# Patient Record
Sex: Female | Born: 1995 | Race: Black or African American | Hispanic: No | Marital: Single | Smoking: Never smoker
Health system: Southern US, Community
[De-identification: ages and names within clinical notes are randomized; demographics above are authoritative.]

## PROBLEM LIST (undated history)

## (undated) DIAGNOSIS — E0500 Thyrotoxicosis with diffuse goiter without thyrotoxic crisis or storm: Secondary | ICD-10-CM

## (undated) DIAGNOSIS — R519 Headache, unspecified: Secondary | ICD-10-CM

## (undated) DIAGNOSIS — M1990 Unspecified osteoarthritis, unspecified site: Secondary | ICD-10-CM

## (undated) DIAGNOSIS — J189 Pneumonia, unspecified organism: Secondary | ICD-10-CM

## (undated) DIAGNOSIS — J42 Unspecified chronic bronchitis: Secondary | ICD-10-CM

## (undated) DIAGNOSIS — Z9289 Personal history of other medical treatment: Secondary | ICD-10-CM

## (undated) DIAGNOSIS — D649 Anemia, unspecified: Secondary | ICD-10-CM

## (undated) DIAGNOSIS — F419 Anxiety disorder, unspecified: Secondary | ICD-10-CM

## (undated) DIAGNOSIS — M329 Systemic lupus erythematosus, unspecified: Secondary | ICD-10-CM

## (undated) DIAGNOSIS — G319 Degenerative disease of nervous system, unspecified: Secondary | ICD-10-CM

## (undated) DIAGNOSIS — I10 Essential (primary) hypertension: Secondary | ICD-10-CM

## (undated) DIAGNOSIS — R51 Headache: Secondary | ICD-10-CM

## (undated) DIAGNOSIS — F329 Major depressive disorder, single episode, unspecified: Secondary | ICD-10-CM

## (undated) DIAGNOSIS — F32A Depression, unspecified: Secondary | ICD-10-CM

## (undated) DIAGNOSIS — IMO0002 Reserved for concepts with insufficient information to code with codable children: Secondary | ICD-10-CM

## (undated) HISTORY — PX: TONSILLECTOMY: SUR1361

---
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
## 2015-08-23 DIAGNOSIS — R7989 Other specified abnormal findings of blood chemistry: Secondary | ICD-10-CM | POA: Insufficient documentation

## 2015-10-10 DIAGNOSIS — G119 Hereditary ataxia, unspecified: Secondary | ICD-10-CM | POA: Insufficient documentation

## 2015-11-21 DIAGNOSIS — G319 Degenerative disease of nervous system, unspecified: Secondary | ICD-10-CM | POA: Diagnosis not present

## 2015-11-21 DIAGNOSIS — Z68.41 Body mass index (BMI) pediatric, greater than or equal to 95th percentile for age: Secondary | ICD-10-CM | POA: Diagnosis not present

## 2015-11-25 DIAGNOSIS — S93401A Sprain of unspecified ligament of right ankle, initial encounter: Secondary | ICD-10-CM | POA: Diagnosis not present

## 2015-11-25 DIAGNOSIS — S99911A Unspecified injury of right ankle, initial encounter: Secondary | ICD-10-CM | POA: Diagnosis not present

## 2015-11-28 DIAGNOSIS — S93401A Sprain of unspecified ligament of right ankle, initial encounter: Secondary | ICD-10-CM | POA: Diagnosis not present

## 2016-01-13 DIAGNOSIS — E0500 Thyrotoxicosis with diffuse goiter without thyrotoxic crisis or storm: Secondary | ICD-10-CM | POA: Diagnosis not present

## 2016-05-17 DIAGNOSIS — T43226A Underdosing of selective serotonin reuptake inhibitors, initial encounter: Secondary | ICD-10-CM | POA: Diagnosis not present

## 2016-05-17 DIAGNOSIS — G319 Degenerative disease of nervous system, unspecified: Secondary | ICD-10-CM | POA: Diagnosis not present

## 2016-05-17 DIAGNOSIS — R451 Restlessness and agitation: Secondary | ICD-10-CM | POA: Diagnosis not present

## 2016-05-17 DIAGNOSIS — T43626A Underdosing of amphetamines, initial encounter: Secondary | ICD-10-CM | POA: Diagnosis not present

## 2016-05-17 DIAGNOSIS — F29 Unspecified psychosis not due to a substance or known physiological condition: Secondary | ICD-10-CM | POA: Diagnosis not present

## 2016-05-17 DIAGNOSIS — R45851 Suicidal ideations: Secondary | ICD-10-CM | POA: Diagnosis not present

## 2016-05-17 DIAGNOSIS — Z91128 Patient's intentional underdosing of medication regimen for other reason: Secondary | ICD-10-CM | POA: Diagnosis not present

## 2016-05-17 DIAGNOSIS — R410 Disorientation, unspecified: Secondary | ICD-10-CM | POA: Diagnosis not present

## 2016-05-23 DIAGNOSIS — Z7251 High risk heterosexual behavior: Secondary | ICD-10-CM | POA: Diagnosis not present

## 2016-05-23 DIAGNOSIS — E0590 Thyrotoxicosis, unspecified without thyrotoxic crisis or storm: Secondary | ICD-10-CM | POA: Diagnosis not present

## 2016-06-18 DIAGNOSIS — G119 Hereditary ataxia, unspecified: Secondary | ICD-10-CM | POA: Diagnosis not present

## 2016-06-18 DIAGNOSIS — M25861 Other specified joint disorders, right knee: Secondary | ICD-10-CM | POA: Diagnosis not present

## 2016-06-18 DIAGNOSIS — R2241 Localized swelling, mass and lump, right lower limb: Secondary | ICD-10-CM | POA: Diagnosis not present

## 2016-06-18 DIAGNOSIS — M79671 Pain in right foot: Secondary | ICD-10-CM | POA: Diagnosis not present

## 2016-06-18 DIAGNOSIS — M79601 Pain in right arm: Secondary | ICD-10-CM | POA: Diagnosis not present

## 2016-06-18 DIAGNOSIS — M79631 Pain in right forearm: Secondary | ICD-10-CM | POA: Diagnosis not present

## 2016-06-18 DIAGNOSIS — M25561 Pain in right knee: Secondary | ICD-10-CM | POA: Diagnosis not present

## 2016-06-29 DIAGNOSIS — E0500 Thyrotoxicosis with diffuse goiter without thyrotoxic crisis or storm: Secondary | ICD-10-CM | POA: Diagnosis not present

## 2016-08-23 ENCOUNTER — Encounter (INDEPENDENT_AMBULATORY_CARE_PROVIDER_SITE_OTHER): Payer: Self-pay | Admitting: Physician Assistant

## 2016-08-23 ENCOUNTER — Ambulatory Visit (INDEPENDENT_AMBULATORY_CARE_PROVIDER_SITE_OTHER): Payer: Medicare Other | Admitting: Physician Assistant

## 2016-08-23 VITALS — BP 121/77 | HR 84 | Temp 99.9°F | Ht 66.14 in | Wt 261.2 lb

## 2016-08-23 DIAGNOSIS — G319 Degenerative disease of nervous system, unspecified: Secondary | ICD-10-CM | POA: Diagnosis not present

## 2016-08-23 DIAGNOSIS — M25561 Pain in right knee: Secondary | ICD-10-CM | POA: Diagnosis not present

## 2016-08-23 DIAGNOSIS — G8929 Other chronic pain: Secondary | ICD-10-CM

## 2016-08-23 DIAGNOSIS — E0500 Thyrotoxicosis with diffuse goiter without thyrotoxic crisis or storm: Secondary | ICD-10-CM | POA: Diagnosis not present

## 2016-08-23 MED ORDER — METHIMAZOLE 10 MG PO TABS: 15.0000 mg | ORAL_TABLET | Freq: Every day | ORAL | 2 refills | Status: DC

## 2016-08-23 NOTE — Patient Instructions (Signed)
Hyperthyroidism Hyperthyroidism is when the thyroid is too active (overactive). Your thyroid is a large gland that is located in your neck. The thyroid helps to control how your body uses food (metabolism). When your thyroid is overactive, it produces too much of a hormone called thyroxine. What are the causes? Causes of hyperthyroidism may include:  Graves disease. This is when your immune system attacks the thyroid gland. This is the most common cause.  Inflammation of the thyroid gland.  Tumor in the thyroid gland or somewhere else.  Excessive use of thyroid medicines, including: ? Prescription thyroid supplement. ? Herbal supplements that mimic thyroid hormones.  Solid or fluid-filled lumps within your thyroid gland (thyroid nodules).  Excessive ingestion of iodine.  What increases the risk?  Being female.  Having a family history of thyroid conditions. What are the signs or symptoms? Signs and symptoms of hyperthyroidism may include:  Nervousness.  Inability to tolerate heat.  Unexplained weight loss.  Diarrhea.  Change in the texture of hair or skin.  Heart skipping beats or making extra beats.  Rapid heart rate.  Loss of menstruation.  Shaky hands.  Fatigue.  Restlessness.  Increased appetite.  Sleep problems.  Enlarged thyroid gland or nodules.  How is this diagnosed? Diagnosis of hyperthyroidism may include:  Medical history and physical exam.  Blood tests.  Ultrasound tests.  How is this treated? Treatment may include:  Medicines to control your thyroid.  Surgery to remove your thyroid.  Radiation therapy.  Follow these instructions at home:  Take medicines only as directed by your health care provider.  Do not use any tobacco products, including cigarettes, chewing tobacco, or electronic cigarettes. If you need help quitting, ask your health care provider.  Do not exercise or do physical activity until your health care provider  approves.  Keep all follow-up appointments as directed by your health care provider. This is important. Contact a health care provider if:  Your symptoms do not get better with treatment.  You have fever.  You are taking thyroid replacement medicine and you: ? Have depression. ? Feel mentally and physically slow. ? Have weight gain. Get help right away if:  You have decreased alertness or a change in your awareness.  You have abdominal pain.  You feel dizzy.  You have a rapid heartbeat.  You have an irregular heartbeat. This information is not intended to replace advice given to you by your health care provider. Make sure you discuss any questions you have with your health care provider. Document Released: 05/07/2005 Document Revised: 10/06/2015 Document Reviewed: 09/22/2013 Elsevier Interactive Patient Education  2017 [employer] Inc[employer]  

## 2016-08-23 NOTE — Progress Notes (Signed)
Pt presents for New patient appointment  Pt states her joints hurt at times  Pt denies having pain today

## 2016-08-23 NOTE — Progress Notes (Signed)
Subjective:  Patient ID: Erika Boyer, female    DOB: [birth year]  Age: 21 y.o. MRN: [id number]  CC: establish care  HPI Erika Boyer is a 21 y.o. female with a PMH of Graves' disease presents to establish care. She is moving here with her mother from the [place], [place] area. Mother does most of the talking for patient. She was treated with methimazole 15 mg before her move here. Pt complains of feeling "all" the hyperthyroid symptoms but does not elaborate further. Patient is mostly concerned with right knee pain after a fall earlier this year. She had a XR knee AP, LAT-RT on 06/18/16 which showed moderate lateral patellar tilt. No subluxation, fracture, dislocation, or joint effusion. Has not seen an orthopedic specialist yet. Pain aggravated with walking or standing for prolonged periods. Mother also states she would like for a PCA to be done due to her daughter's cerebellar atrophy.    Review of Systems  Constitutional: Negative for chills, fever and malaise/fatigue.  Eyes: Negative for blurred vision.  Respiratory: Negative for shortness of breath.   Cardiovascular: Negative for chest pain and palpitations.  Gastrointestinal: Negative for abdominal pain and nausea.  Genitourinary: Negative for dysuria and hematuria.  Musculoskeletal: Positive for joint pain. Negative for myalgias.  Skin: Negative for rash.  Neurological: Negative for tingling and headaches.  Psychiatric/Behavioral: Negative for depression. The patient is not nervous/anxious.     Objective:  BP 121/77 (BP Location: Right Arm, Patient Position: Sitting, Cuff Size: Large)   Pulse 84   Temp 99.9 F (37.7 C) (Oral)   Ht 5' 6.14" (1.68 m)   Wt 261 lb 3.2 oz (118.5 kg)   LMP 07/24/2016 (Approximate)   SpO2 100%   BMI 41.98 kg/m   BP/Weight 08/23/2016  Systolic BP 121  Diastolic BP 77  Wt. (Lbs) 261.2  BMI 41.98      Physical Exam  Constitutional: She is oriented to person, place, and time.  obese, NAD   HENT:  Head: Normocephalic and atraumatic.  Eyes: No scleral icterus.  Neck: Normal range of motion. Neck supple. No thyromegaly present.  Cardiovascular: Normal rate, regular rhythm and normal heart sounds.   Pulmonary/Chest: Effort normal and breath sounds normal.  Abdominal: Soft. Bowel sounds are normal. There is no tenderness.  Musculoskeletal: She exhibits no edema.  Neurological: She is alert and oriented to person, place, and time.  Skin: Skin is warm and dry. No rash noted. No erythema. No pallor.  Psychiatric: She has a normal mood and affect. Her behavior is normal. Thought content normal.  Vitals reviewed.    Assessment & Plan:   1. Graves disease - Thyroid Panel With TSH - Ambulatory referral to Endocrinology. Pt to discuss thyroidectomy vs radioactive Iodine treatment. - CBC with Differential/Platelet - Comprehensive metabolic panel - methimazole (TAPAZOLE) 10 MG tablet; Take 1.5 tablets (15 mg total) by mouth daily.  Dispense: 45 tablet; Refill: 2  2. Chronic pain of right knee - Right knee with patellar tilt - AMB referral to orthopedics  3. Cerebellar atrophy - Ambulatory referral to Neurology - Mother's "PCA" request could not be justified at this point nor were any forms presented to me. Will need to have neurology consult to assess how pt's cerebellar atrophy is impacting patient's ability to take care of herself.    Meds ordered this encounter  Medications  . methimazole (TAPAZOLE) 10 MG tablet    Sig: Take 1.5 tablets (15 mg total) by mouth daily.    Dispense:  45  tablet    Refill:  2    Order Specific Question:   Supervising Provider    Answer:   Quentin Angst [[id number]]    Follow-up: Return in about 8 weeks (around 10/18/2016) for f/u thyroid.   Loletta Specter PA

## 2016-08-24 LAB — CBC WITH DIFFERENTIAL/PLATELET
BASOS ABS: 0 10*3/uL (ref 0.0–0.2)
Basos: 1 %
EOS (ABSOLUTE): 0.1 10*3/uL (ref 0.0–0.4)
Eos: 3 %
Hematocrit: 32.9 % — ABNORMAL LOW (ref 34.0–46.6)
Hemoglobin: 10.1 g/dL — ABNORMAL LOW (ref 11.1–15.9)
IMMATURE GRANS (ABS): 0 10*3/uL (ref 0.0–0.1)
Immature Granulocytes: 0 %
LYMPHS ABS: 1.1 10*3/uL (ref 0.7–3.1)
LYMPHS: 31 %
MCH: 23.8 pg — AB (ref 26.6–33.0)
MCHC: 30.7 g/dL — AB (ref 31.5–35.7)
MCV: 78 fL — AB (ref 79–97)
MONOS ABS: 0.2 10*3/uL (ref 0.1–0.9)
Monocytes: 7 %
NEUTROS ABS: 2.1 10*3/uL (ref 1.4–7.0)
Neutrophils: 58 %
PLATELETS: 293 10*3/uL (ref 150–379)
RBC: 4.24 x10E6/uL (ref 3.77–5.28)
RDW: 14.8 % (ref 12.3–15.4)
WBC: 3.6 10*3/uL (ref 3.4–10.8)

## 2016-08-24 LAB — COMPREHENSIVE METABOLIC PANEL
A/G RATIO: 0.8 — AB (ref 1.2–2.2)
ALK PHOS: 56 IU/L (ref 39–117)
ALT: 11 IU/L (ref 0–32)
AST: 14 IU/L (ref 0–40)
Albumin: 3.5 g/dL (ref 3.5–5.5)
BILIRUBIN TOTAL: 0.2 mg/dL (ref 0.0–1.2)
BUN/Creatinine Ratio: 12 (ref 9–23)
BUN: 9 mg/dL (ref 6–20)
CHLORIDE: 102 mmol/L (ref 96–106)
CO2: 27 mmol/L (ref 18–29)
Calcium: 8.7 mg/dL (ref 8.7–10.2)
Creatinine, Ser: 0.77 mg/dL (ref 0.57–1.00)
GFR calc Af Amer: 129 mL/min/{1.73_m2} (ref >59)
GFR calc non Af Amer: 112 mL/min/{1.73_m2} (ref >59)
GLUCOSE: 77 mg/dL (ref 65–99)
Globulin, Total: 4.2 g/dL (ref 1.5–4.5)
Potassium: 4.1 mmol/L (ref 3.5–5.2)
Sodium: 140 mmol/L (ref 134–144)
Total Protein: 7.7 g/dL (ref 6.0–8.5)

## 2016-08-24 LAB — THYROID PANEL WITH TSH
FREE THYROXINE INDEX: 1.9 (ref 1.2–4.9)
T3 UPTAKE RATIO: 28 % (ref 24–39)
T4 TOTAL: 6.8 ug/dL (ref 4.5–12.0)
TSH: 4.71 u[IU]/mL — ABNORMAL HIGH (ref 0.450–4.500)

## 2016-09-06 ENCOUNTER — Ambulatory Visit (INDEPENDENT_AMBULATORY_CARE_PROVIDER_SITE_OTHER): Payer: Self-pay | Admitting: Physician Assistant

## 2016-09-21 ENCOUNTER — Ambulatory Visit (INDEPENDENT_AMBULATORY_CARE_PROVIDER_SITE_OTHER): Payer: Medicare Other | Admitting: Physician Assistant

## 2016-09-21 ENCOUNTER — Ambulatory Visit (INDEPENDENT_AMBULATORY_CARE_PROVIDER_SITE_OTHER): Payer: Medicare Other

## 2016-09-21 DIAGNOSIS — S93401A Sprain of unspecified ligament of right ankle, initial encounter: Secondary | ICD-10-CM

## 2016-09-21 DIAGNOSIS — M25561 Pain in right knee: Secondary | ICD-10-CM | POA: Diagnosis not present

## 2016-09-21 DIAGNOSIS — M79671 Pain in right foot: Secondary | ICD-10-CM

## 2016-09-21 DIAGNOSIS — G8929 Other chronic pain: Secondary | ICD-10-CM | POA: Diagnosis not present

## 2016-09-21 NOTE — Progress Notes (Signed)
Office Visit Note   Patient: Erika Boyer           Date of Birth: 1995/12/05           MRN: [id number] Visit Date: 09/21/2016              Requested by: Loletta Specteroger David Gomez, PA-C [address] [place], [place] [address] PCP: Loletta Specteroger David Gomez, PA-C   Assessment & Plan: Visit Diagnoses:  1. Chronic pain of right knee   2. Sprain of right ankle, unspecified ligament, initial encounter   3. Pain in right foot     Plan: We will send her to physical therapy for quad strengthening and also for range of motion and generalized strengthening of the right ankle. Like to see her back in a month check progress lack of. She is to wear the ASO brace while ambulating she does not need to wear this to bed. Therapy is to wean her out of the ASO brace as her proprioception and strength of the right ankle improved. Follow-Up Instructions: Return in about 4 weeks (around 10/19/2016).   Orders:  Orders Placed This Encounter  Procedures  . XR Knee 1-2 Views Right  . XR Ankle Complete Right  . XR Foot Complete Right   No orders of the defined types were placed in this encounter.     Procedures: No procedures performed   Clinical Data: No additional findings.   Subjective: Right ankle and right knee pain  HPI Erika ReinOmaure is a 21 year old female I'm seeing for the first time for right knee pain is been ongoing for 7 months to a year. An acute right foot and ankle pain without known injury. She states she's been unable to bear weight on the right foot and ankle for the past 2 days. [place]. Patient with a history of Graves' disease and cerebellar atrophy. She is due to see a neurologist here in the near future. Mother states that her cerebellar atrophy started around the age of 21 after she had a virus. Patient and her mother both state that [place] was [place] in [place] and that she was told she could possibly need  reconstruction but they suggested that she try therapy first she did not go to therapy. Knee pain has been worse with ambulation. She does not describe any true mechanical symptoms of the knee. No patellar dislocation. Review of Systems No fevers chills, chest pain, shortness of breath. Otherwise please see history of present illness.  Objective: Vital Signs: Height is 5 foot 5 inches weight 250 pounds  Physical Exam  Constitutional: She is oriented to person, place, and time. She appears well-developed and well-nourished.  Cardiovascular: Intact distal pulses.   Neurological: She is alert and oriented to person, place, and time.    Ortho Exam Bilateral knee she has overall good range of motion of both knees no instability valgus varus stressing. Right knee she has tenderness over the patellar tibial tendon. She is able do a straight leg raise. There is no erythema or edema or effusion of either knee. Bilateral feet with slight swelling right greater than left. No ecchymosis appreciated. Tenderness over the anterior mid ankle to the lateral ankle no tenderness over the medial or lateral malleolus. Achilles is intact nontender. She is nontender at the medial tubercle of the calcaneus. She is reluctant to invert and evert foot secondary pain. Remainder of the foot is nontender. Specialty Comments:  No specialty  comments available.  Imaging: Xr Ankle Complete Right  Result Date: 09/21/2016 3 views right ankle shows the talus well located within the ankle mortise no diastases. No acute fractures no bony abnormalities  Xr Foot Complete Right  Result Date: 09/21/2016 3 views right foot: No acute fracture. Hallux valgus deformity. Slight Morton's type foot with the second and third metatarsals being longer first. No bony abnormalities otherwise.  Xr Knee 1-2 Views Right  Result Date: 09/21/2016 Right knee AP lateral views: No acute fracture. Medial lateral joint lines are well maintained.  Patellofemoral joint appears to be well maintained. No joint effusion.    PMFS History: Patient Active Problem List   Diagnosis Date Noted  . Graves disease 08/23/2016   No past medical history on file.  No family history on file.  No past surgical history on file. Social History   Occupational History  . Not on file.   Social History Main Topics  . Smoking status: Never Smoker  . Smokeless tobacco: Never Used  . Alcohol use Not on file  . Drug use: Unknown  . Sexual activity: Not on file

## 2016-09-24 ENCOUNTER — Other Ambulatory Visit (INDEPENDENT_AMBULATORY_CARE_PROVIDER_SITE_OTHER): Payer: Self-pay

## 2016-09-24 DIAGNOSIS — G8929 Other chronic pain: Secondary | ICD-10-CM

## 2016-09-24 DIAGNOSIS — M25561 Pain in right knee: Principal | ICD-10-CM

## 2016-09-27 ENCOUNTER — Ambulatory Visit: Payer: Medicare Other

## 2016-09-28 ENCOUNTER — Telehealth (INDEPENDENT_AMBULATORY_CARE_PROVIDER_SITE_OTHER): Payer: Self-pay | Admitting: Physician Assistant

## 2016-09-28 NOTE — Telephone Encounter (Signed)
Per patient's mother needs letter stating she can not where seat belt all the time due to her illness.  Parent left a letter, for Sindy Messingoger Gomez PA to review.  Please follow up with patient.

## 2016-10-01 NOTE — Telephone Encounter (Signed)
Left message for patient with details of why letter will not be given. Erika Boyer, CMA

## 2016-10-01 NOTE — Telephone Encounter (Signed)
Same as for the mother. I will not write a letter stating that she should not be wearing a seatbelt. She may obtain a neurological consult to have her medical condition evaluated for the exemption of wearing a seatbelt.

## 2016-10-01 NOTE — Telephone Encounter (Signed)
FWD to PCP. Verlon Pischke S Chistine Dematteo, CMA  

## 2016-10-03 ENCOUNTER — Ambulatory Visit: Payer: Medicare Other | Admitting: Physical Therapy

## 2016-10-03 ENCOUNTER — Ambulatory Visit (INDEPENDENT_AMBULATORY_CARE_PROVIDER_SITE_OTHER): Payer: Self-pay | Admitting: Orthopaedic Surgery

## 2016-10-03 ENCOUNTER — Inpatient Hospital Stay (HOSPITAL_COMMUNITY): Payer: Medicare Other

## 2016-10-03 ENCOUNTER — Inpatient Hospital Stay (HOSPITAL_COMMUNITY)
Admission: EM | Admit: 2016-10-03 | Discharge: 2016-10-12 | DRG: 871 | Disposition: A | Discharge status: Rehabilitation Facility | Payer: Medicare Other | Attending: Internal Medicine | Admitting: Internal Medicine

## 2016-10-03 ENCOUNTER — Encounter (HOSPITAL_COMMUNITY): Payer: Self-pay

## 2016-10-03 ENCOUNTER — Emergency Department (HOSPITAL_COMMUNITY): Payer: Medicare Other

## 2016-10-03 DIAGNOSIS — Z0189 Encounter for other specified special examinations: Secondary | ICD-10-CM

## 2016-10-03 DIAGNOSIS — M7989 Other specified soft tissue disorders: Secondary | ICD-10-CM | POA: Diagnosis not present

## 2016-10-03 DIAGNOSIS — A419 Sepsis, unspecified organism: Principal | ICD-10-CM | POA: Diagnosis present

## 2016-10-03 DIAGNOSIS — N289 Disorder of kidney and ureter, unspecified: Secondary | ICD-10-CM | POA: Diagnosis not present

## 2016-10-03 DIAGNOSIS — E0590 Thyrotoxicosis, unspecified without thyrotoxic crisis or storm: Secondary | ICD-10-CM | POA: Diagnosis present

## 2016-10-03 DIAGNOSIS — R0602 Shortness of breath: Secondary | ICD-10-CM

## 2016-10-03 DIAGNOSIS — Z91013 Allergy to seafood: Secondary | ICD-10-CM | POA: Diagnosis not present

## 2016-10-03 DIAGNOSIS — J90 Pleural effusion, not elsewhere classified: Secondary | ICD-10-CM | POA: Diagnosis not present

## 2016-10-03 DIAGNOSIS — J181 Lobar pneumonia, unspecified organism: Secondary | ICD-10-CM | POA: Diagnosis not present

## 2016-10-03 DIAGNOSIS — R000 Tachycardia, unspecified: Secondary | ICD-10-CM

## 2016-10-03 DIAGNOSIS — J9600 Acute respiratory failure, unspecified whether with hypoxia or hypercapnia: Secondary | ICD-10-CM | POA: Diagnosis not present

## 2016-10-03 DIAGNOSIS — M329 Systemic lupus erythematosus, unspecified: Secondary | ICD-10-CM | POA: Diagnosis present

## 2016-10-03 DIAGNOSIS — Z9889 Other specified postprocedural states: Secondary | ICD-10-CM | POA: Diagnosis not present

## 2016-10-03 DIAGNOSIS — J9690 Respiratory failure, unspecified, unspecified whether with hypoxia or hypercapnia: Secondary | ICD-10-CM | POA: Diagnosis not present

## 2016-10-03 DIAGNOSIS — N179 Acute kidney failure, unspecified: Secondary | ICD-10-CM | POA: Diagnosis present

## 2016-10-03 DIAGNOSIS — J918 Pleural effusion in other conditions classified elsewhere: Secondary | ICD-10-CM | POA: Diagnosis not present

## 2016-10-03 DIAGNOSIS — F79 Unspecified intellectual disabilities: Secondary | ICD-10-CM | POA: Diagnosis not present

## 2016-10-03 DIAGNOSIS — I309 Acute pericarditis, unspecified: Secondary | ICD-10-CM | POA: Diagnosis not present

## 2016-10-03 DIAGNOSIS — J189 Pneumonia, unspecified organism: Secondary | ICD-10-CM

## 2016-10-03 DIAGNOSIS — R0989 Other specified symptoms and signs involving the circulatory and respiratory systems: Secondary | ICD-10-CM | POA: Diagnosis not present

## 2016-10-03 DIAGNOSIS — R600 Localized edema: Secondary | ICD-10-CM | POA: Diagnosis not present

## 2016-10-03 DIAGNOSIS — I360 Nonrheumatic tricuspid (valve) stenosis: Secondary | ICD-10-CM | POA: Diagnosis not present

## 2016-10-03 DIAGNOSIS — M320 Drug-induced systemic lupus erythematosus: Secondary | ICD-10-CM | POA: Diagnosis not present

## 2016-10-03 DIAGNOSIS — Z23 Encounter for immunization: Secondary | ICD-10-CM

## 2016-10-03 DIAGNOSIS — I517 Cardiomegaly: Secondary | ICD-10-CM | POA: Diagnosis not present

## 2016-10-03 DIAGNOSIS — N19 Unspecified kidney failure: Secondary | ICD-10-CM

## 2016-10-03 DIAGNOSIS — F09 Unspecified mental disorder due to known physiological condition: Secondary | ICD-10-CM | POA: Diagnosis not present

## 2016-10-03 DIAGNOSIS — Z79899 Other long term (current) drug therapy: Secondary | ICD-10-CM | POA: Diagnosis not present

## 2016-10-03 DIAGNOSIS — R091 Pleurisy: Secondary | ICD-10-CM | POA: Diagnosis not present

## 2016-10-03 DIAGNOSIS — Z6841 Body Mass Index (BMI) 40.0 and over, adult: Secondary | ICD-10-CM

## 2016-10-03 DIAGNOSIS — D509 Iron deficiency anemia, unspecified: Secondary | ICD-10-CM | POA: Diagnosis present

## 2016-10-03 DIAGNOSIS — E876 Hypokalemia: Secondary | ICD-10-CM

## 2016-10-03 DIAGNOSIS — I10 Essential (primary) hypertension: Secondary | ICD-10-CM | POA: Diagnosis not present

## 2016-10-03 DIAGNOSIS — Z452 Encounter for adjustment and management of vascular access device: Secondary | ICD-10-CM | POA: Diagnosis not present

## 2016-10-03 DIAGNOSIS — G319 Degenerative disease of nervous system, unspecified: Secondary | ICD-10-CM | POA: Diagnosis not present

## 2016-10-03 DIAGNOSIS — D62 Acute posthemorrhagic anemia: Secondary | ICD-10-CM

## 2016-10-03 DIAGNOSIS — J9601 Acute respiratory failure with hypoxia: Secondary | ICD-10-CM | POA: Diagnosis present

## 2016-10-03 DIAGNOSIS — T50905A Adverse effect of unspecified drugs, medicaments and biological substances, initial encounter: Secondary | ICD-10-CM

## 2016-10-03 DIAGNOSIS — R069 Unspecified abnormalities of breathing: Secondary | ICD-10-CM | POA: Diagnosis not present

## 2016-10-03 DIAGNOSIS — I313 Pericardial effusion (noninflammatory): Secondary | ICD-10-CM | POA: Diagnosis not present

## 2016-10-03 DIAGNOSIS — E0500 Thyrotoxicosis with diffuse goiter without thyrotoxic crisis or storm: Secondary | ICD-10-CM | POA: Diagnosis not present

## 2016-10-03 DIAGNOSIS — Z4682 Encounter for fitting and adjustment of non-vascular catheter: Secondary | ICD-10-CM | POA: Diagnosis not present

## 2016-10-03 DIAGNOSIS — L932 Other local lupus erythematosus: Secondary | ICD-10-CM

## 2016-10-03 DIAGNOSIS — Z9289 Personal history of other medical treatment: Secondary | ICD-10-CM

## 2016-10-03 DIAGNOSIS — R5381 Other malaise: Secondary | ICD-10-CM | POA: Diagnosis not present

## 2016-10-03 HISTORY — DX: Iron deficiency anemia, unspecified: D50.9

## 2016-10-03 HISTORY — DX: Anemia, unspecified: D64.9

## 2016-10-03 HISTORY — DX: Personal history of other medical treatment: Z92.89

## 2016-10-03 HISTORY — DX: Headache: R51

## 2016-10-03 HISTORY — DX: Unspecified osteoarthritis, unspecified site: M19.90

## 2016-10-03 HISTORY — DX: Major depressive disorder, single episode, unspecified: F32.9

## 2016-10-03 HISTORY — DX: Degenerative disease of nervous system, unspecified: G31.9

## 2016-10-03 HISTORY — DX: Unspecified chronic bronchitis: J42

## 2016-10-03 HISTORY — DX: Anxiety disorder, unspecified: F41.9

## 2016-10-03 HISTORY — DX: Reserved for concepts with insufficient information to code with codable children: IMO0002

## 2016-10-03 HISTORY — DX: Pneumonia, unspecified organism: J18.9

## 2016-10-03 HISTORY — DX: Essential (primary) hypertension: I10

## 2016-10-03 HISTORY — DX: Systemic lupus erythematosus, unspecified: M32.9

## 2016-10-03 HISTORY — DX: Depression, unspecified: F32.A

## 2016-10-03 HISTORY — DX: Thyrotoxicosis, unspecified without thyrotoxic crisis or storm: E05.90

## 2016-10-03 HISTORY — DX: Headache, unspecified: R51.9

## 2016-10-03 HISTORY — DX: Thyrotoxicosis with diffuse goiter without thyrotoxic crisis or storm: E05.00

## 2016-10-03 LAB — URINALYSIS, ROUTINE W REFLEX MICROSCOPIC
BILIRUBIN URINE: NEGATIVE
Glucose, UA: NEGATIVE mg/dL
KETONES UR: 5 mg/dL — AB
Leukocytes, UA: NEGATIVE
Nitrite: NEGATIVE
PROTEIN: 100 mg/dL — AB
Specific Gravity, Urine: 1.024 (ref 1.005–1.030)
pH: 5 (ref 5.0–8.0)

## 2016-10-03 LAB — BLOOD GAS, ARTERIAL
Acid-base deficit: 5.6 mmol/L — ABNORMAL HIGH (ref 0.0–2.0)
Bicarbonate: 18.7 mmol/L — ABNORMAL LOW (ref 20.0–28.0)
DRAWN BY: 249101
FIO2: 100
MECHVT: 460 mL
O2 SAT: 99.2 %
PEEP: 5 cmH2O
PH ART: 7.368 (ref 7.350–7.450)
Patient temperature: 98.6
RATE: 16 resp/min
pCO2 arterial: 33.4 mmHg (ref 32.0–48.0)
pO2, Arterial: 181 mmHg — ABNORMAL HIGH (ref 83.0–108.0)

## 2016-10-03 LAB — COMPREHENSIVE METABOLIC PANEL
ALT: 17 U/L (ref 14–54)
AST: 34 U/L (ref 15–41)
Albumin: 2.5 g/dL — ABNORMAL LOW (ref 3.5–5.0)
Alkaline Phosphatase: 41 U/L (ref 38–126)
Anion gap: 11 (ref 5–15)
BUN: 18 mg/dL (ref 6–20)
CHLORIDE: 107 mmol/L (ref 101–111)
CO2: 19 mmol/L — AB (ref 22–32)
CREATININE: 1.46 mg/dL — AB (ref 0.44–1.00)
Calcium: 7.9 mg/dL — ABNORMAL LOW (ref 8.9–10.3)
GFR calc Af Amer: 59 mL/min — ABNORMAL LOW (ref >60)
GFR calc non Af Amer: 51 mL/min — ABNORMAL LOW (ref >60)
GLUCOSE: 118 mg/dL — AB (ref 65–99)
Potassium: 3.3 mmol/L — ABNORMAL LOW (ref 3.5–5.1)
SODIUM: 137 mmol/L (ref 135–145)
Total Bilirubin: 1.4 mg/dL — ABNORMAL HIGH (ref 0.3–1.2)
Total Protein: 7.1 g/dL (ref 6.5–8.1)

## 2016-10-03 LAB — CBC WITH DIFFERENTIAL/PLATELET
BASOS PCT: 1 %
BLASTS: 0 %
Band Neutrophils: 0 %
Basophils Absolute: 0.1 10*3/uL (ref 0.0–0.1)
EOS PCT: 1 %
Eosinophils Absolute: 0.1 10*3/uL (ref 0.0–0.7)
HEMATOCRIT: 29 % — AB (ref 36.0–46.0)
HEMOGLOBIN: 9.5 g/dL — AB (ref 12.0–15.0)
Lymphocytes Relative: 14 %
Lymphs Abs: 1.9 10*3/uL (ref 0.7–4.0)
MCH: 23 pg — AB (ref 26.0–34.0)
MCHC: 32.8 g/dL (ref 30.0–36.0)
MCV: 70.2 fL — AB (ref 78.0–100.0)
MONO ABS: 0.4 10*3/uL (ref 0.1–1.0)
MYELOCYTES: 0 %
Metamyelocytes Relative: 0 %
Monocytes Relative: 3 %
NRBC: 0 /100{WBCs}
Neutro Abs: 11 10*3/uL — ABNORMAL HIGH (ref 1.7–7.7)
Neutrophils Relative %: 81 %
Platelets: 241 10*3/uL (ref 150–400)
Promyelocytes Absolute: 0 %
RBC: 4.13 MIL/uL (ref 3.87–5.11)
RDW: 15.8 % — ABNORMAL HIGH (ref 11.5–15.5)
WBC: 13.5 10*3/uL — ABNORMAL HIGH (ref 4.0–10.5)

## 2016-10-03 LAB — I-STAT CG4 LACTIC ACID, ED
Lactic Acid, Venous: 2.62 mmol/L (ref 0.5–1.9)
Lactic Acid, Venous: 2.59 mmol/L (ref 0.5–1.9)

## 2016-10-03 LAB — TROPONIN I
TROPONIN I: 0.05 ng/mL — AB (ref <0.03)
TROPONIN I: 0.03 ng/mL — AB (ref <0.03)

## 2016-10-03 LAB — FOLATE: Folate: 21.2 ng/mL (ref >5.9)

## 2016-10-03 LAB — RETICULOCYTES
RBC.: 3.84 MIL/uL — ABNORMAL LOW (ref 3.87–5.11)
Retic Count, Absolute: 38.4 10*3/uL (ref 19.0–186.0)
Retic Ct Pct: 1 % (ref 0.4–3.1)

## 2016-10-03 LAB — PROCALCITONIN: PROCALCITONIN: 1.81 ng/mL

## 2016-10-03 LAB — IRON AND TIBC: TIBC: 190 ug/dL — AB (ref 250–450)

## 2016-10-03 LAB — I-STAT ARTERIAL BLOOD GAS, ED
ACID-BASE DEFICIT: 6 mmol/L — AB (ref 0.0–2.0)
Bicarbonate: 15.9 mmol/L — ABNORMAL LOW (ref 20.0–28.0)
O2 SAT: 85 %
PH ART: 7.444 (ref 7.350–7.450)
Patient temperature: 103.1
TCO2: 17 mmol/L (ref 0–100)
pCO2 arterial: 23.7 mmHg — ABNORMAL LOW (ref 32.0–48.0)
pO2, Arterial: 54 mmHg — ABNORMAL LOW (ref 83.0–108.0)

## 2016-10-03 LAB — T4, FREE: FREE T4: 0.91 ng/dL (ref 0.61–1.12)

## 2016-10-03 LAB — FERRITIN: FERRITIN: 298 ng/mL (ref 11–307)

## 2016-10-03 LAB — PROTIME-INR
INR: 1.68
PROTHROMBIN TIME: 20 s — AB (ref 11.4–15.2)

## 2016-10-03 LAB — TSH: TSH: 7.093 u[IU]/mL — AB (ref 0.350–4.500)

## 2016-10-03 LAB — VITAMIN B12: Vitamin B-12: 2111 pg/mL — ABNORMAL HIGH (ref 180–914)

## 2016-10-03 LAB — LACTIC ACID, PLASMA
Lactic Acid, Venous: 2.3 mmol/L (ref 0.5–1.9)
Lactic Acid, Venous: 3.2 mmol/L (ref 0.5–1.9)

## 2016-10-03 LAB — SEDIMENTATION RATE: SED RATE: 70 mm/h — AB (ref 0–22)

## 2016-10-03 LAB — BRAIN NATRIURETIC PEPTIDE: B Natriuretic Peptide: 114.1 pg/mL — ABNORMAL HIGH (ref 0.0–100.0)

## 2016-10-03 LAB — TRIGLYCERIDES: TRIGLYCERIDES: 139 mg/dL (ref <150)

## 2016-10-03 LAB — PHOSPHORUS: PHOSPHORUS: 3.5 mg/dL (ref 2.5–4.6)

## 2016-10-03 LAB — APTT: aPTT: 37 seconds — ABNORMAL HIGH (ref 24–36)

## 2016-10-03 LAB — MRSA PCR SCREENING: MRSA BY PCR: NEGATIVE

## 2016-10-03 LAB — MAGNESIUM: MAGNESIUM: 1.7 mg/dL (ref 1.7–2.4)

## 2016-10-03 MED ORDER — ROCURONIUM BROMIDE 50 MG/5ML IV SOLN
1.0000 mg/kg | Freq: Once | INTRAVENOUS | Status: AC
  Administered 2016-10-03: 50 mg via INTRAVENOUS

## 2016-10-03 MED ORDER — FENTANYL CITRATE (PF) 100 MCG/2ML IJ SOLN
100.0000 ug | INTRAMUSCULAR | Status: DC | PRN
  Administered 2016-10-03 – 2016-10-05 (×10): 100 ug via INTRAVENOUS
  Filled 2016-10-03 (×6): qty 2

## 2016-10-03 MED ORDER — FENTANYL CITRATE (PF) 100 MCG/2ML IJ SOLN
100.0000 ug | INTRAMUSCULAR | Status: DC | PRN
  Administered 2016-10-04: 100 ug via INTRAVENOUS
  Filled 2016-10-03 (×6): qty 2

## 2016-10-03 MED ORDER — DEXTROSE 5 % IV SOLN
500.0000 mg | INTRAVENOUS | Status: DC
  Administered 2016-10-04 – 2016-10-07 (×4): 500 mg via INTRAVENOUS
  Filled 2016-10-03 (×4): qty 500

## 2016-10-03 MED ORDER — KETAMINE HCL 10 MG/ML IJ SOLN: 2.0000 mg/kg | Freq: Once | INTRAMUSCULAR | Status: DC

## 2016-10-03 MED ORDER — SODIUM CHLORIDE 0.9 % IV BOLUS (SEPSIS)
1000.0000 mL | Freq: Once | INTRAVENOUS | Status: AC
  Administered 2016-10-03: 1000 mL via INTRAVENOUS

## 2016-10-03 MED ORDER — HEPARIN SODIUM (PORCINE) 5000 UNIT/ML IJ SOLN: 5000.0000 [IU] | Freq: Three times a day (TID) | INTRAMUSCULAR | Status: DC

## 2016-10-03 MED ORDER — POTASSIUM CHLORIDE 10 MEQ/50ML IV SOLN: 10.0000 meq | INTRAVENOUS | Status: DC

## 2016-10-03 MED ORDER — ACETAMINOPHEN 325 MG PO TABS
650.0000 mg | ORAL_TABLET | Freq: Once | ORAL | Status: AC
  Administered 2016-10-03: 650 mg via ORAL
  Filled 2016-10-03: qty 2

## 2016-10-03 MED ORDER — DEXTROSE 5 % IV SOLN
1.0000 g | INTRAVENOUS | Status: AC
  Administered 2016-10-04 – 2016-10-09 (×6): 1 g via INTRAVENOUS
  Filled 2016-10-03 (×6): qty 10

## 2016-10-03 MED ORDER — DOCUSATE SODIUM 50 MG/5ML PO LIQD: 100.0000 mg | Freq: Two times a day (BID) | ORAL | Status: DC | PRN

## 2016-10-03 MED ORDER — SODIUM CHLORIDE 0.9 % IV SOLN
INTRAVENOUS | Status: DC
  Administered 2016-10-03 – 2016-10-06 (×9): via INTRAVENOUS

## 2016-10-03 MED ORDER — ASPIRIN 81 MG PO CHEW
81.0000 mg | CHEWABLE_TABLET | Freq: Every day | ORAL | Status: DC
  Administered 2016-10-03 – 2016-10-05 (×3): 81 mg
  Filled 2016-10-03 (×3): qty 1

## 2016-10-03 MED ORDER — ETOMIDATE 2 MG/ML IV SOLN
0.3000 mg/kg | Freq: Once | INTRAVENOUS | Status: AC
  Administered 2016-10-03: 20 mg via INTRAVENOUS

## 2016-10-03 MED ORDER — METHYLPREDNISOLONE SODIUM SUCC 40 MG IJ SOLR
40.0000 mg | Freq: Two times a day (BID) | INTRAMUSCULAR | Status: DC
  Administered 2016-10-03 – 2016-10-06 (×5): 40 mg via INTRAVENOUS
  Filled 2016-10-03 (×5): qty 1

## 2016-10-03 MED ORDER — DEXTROSE 5 % IV SOLN
500.0000 mg | Freq: Once | INTRAVENOUS | Status: AC
  Administered 2016-10-03: 500 mg via INTRAVENOUS
  Filled 2016-10-03: qty 500

## 2016-10-03 MED ORDER — MIDAZOLAM HCL 2 MG/2ML IJ SOLN
4.0000 mg | Freq: Once | INTRAMUSCULAR | Status: AC
  Administered 2016-10-03: 4 mg via INTRAVENOUS
  Filled 2016-10-03 (×2): qty 4

## 2016-10-03 MED ORDER — PROPOFOL 1000 MG/100ML IV EMUL
0.0000 ug/kg/min | INTRAVENOUS | Status: DC
  Administered 2016-10-03: 10 ug/kg/min via INTRAVENOUS
  Administered 2016-10-03: 40 ug/kg/min via INTRAVENOUS
  Administered 2016-10-04 (×2): 30 ug/kg/min via INTRAVENOUS
  Administered 2016-10-04: 40 ug/kg/min via INTRAVENOUS
  Administered 2016-10-04: 50 ug/kg/min via INTRAVENOUS
  Administered 2016-10-04: 35 ug/kg/min via INTRAVENOUS
  Administered 2016-10-04: 40 ug/kg/min via INTRAVENOUS
  Administered 2016-10-05: 20 ug/kg/min via INTRAVENOUS
  Administered 2016-10-05 (×2): 40 ug/kg/min via INTRAVENOUS
  Filled 2016-10-03 (×5): qty 100
  Filled 2016-10-03 (×2): qty 200
  Filled 2016-10-03 (×4): qty 100

## 2016-10-03 MED ORDER — MIDAZOLAM HCL 2 MG/2ML IJ SOLN: 4.0000 mg | Freq: Once | INTRAMUSCULAR | Status: DC

## 2016-10-03 MED ORDER — PANTOPRAZOLE SODIUM 40 MG IV SOLR
40.0000 mg | Freq: Every day | INTRAVENOUS | Status: DC
  Administered 2016-10-03 – 2016-10-04 (×2): 40 mg via INTRAVENOUS
  Filled 2016-10-03 (×2): qty 40

## 2016-10-03 MED ORDER — ONDANSETRON HCL 4 MG/2ML IJ SOLN
4.0000 mg | Freq: Four times a day (QID) | INTRAMUSCULAR | Status: DC | PRN
  Administered 2016-10-06 – 2016-10-09 (×3): 4 mg via INTRAVENOUS
  Filled 2016-10-03 (×3): qty 2

## 2016-10-03 MED ORDER — ROCURONIUM BROMIDE 50 MG/5ML IV SOLN: 1.0000 mg/kg | Freq: Once | INTRAVENOUS | Status: DC

## 2016-10-03 MED ORDER — FENTANYL CITRATE (PF) 100 MCG/2ML IJ SOLN
50.0000 ug | Freq: Once | INTRAMUSCULAR | Status: DC
  Filled 2016-10-03: qty 2

## 2016-10-03 MED ORDER — SODIUM CHLORIDE 0.9 % IV SOLN: 250.0000 mL | INTRAVENOUS | Status: DC | PRN

## 2016-10-03 MED ORDER — ENOXAPARIN SODIUM 40 MG/0.4ML ~~LOC~~ SOLN
40.0000 mg | SUBCUTANEOUS | Status: DC
  Administered 2016-10-04 – 2016-10-12 (×9): 40 mg via SUBCUTANEOUS
  Filled 2016-10-03 (×9): qty 0.4

## 2016-10-03 MED ORDER — ORAL CARE MOUTH RINSE
15.0000 mL | Freq: Four times a day (QID) | OROMUCOSAL | Status: DC
  Administered 2016-10-04 (×2): 15 mL via OROMUCOSAL

## 2016-10-03 MED ORDER — DEXTROSE 5 % IV SOLN
1.0000 g | Freq: Once | INTRAVENOUS | Status: AC
  Administered 2016-10-03: 1 g via INTRAVENOUS
  Filled 2016-10-03: qty 10

## 2016-10-03 MED ORDER — CHLORHEXIDINE GLUCONATE 0.12% ORAL RINSE (MEDLINE KIT)
15.0000 mL | Freq: Two times a day (BID) | OROMUCOSAL | Status: DC
  Administered 2016-10-03: 15 mL via OROMUCOSAL

## 2016-10-03 NOTE — ED Notes (Signed)
ED Provider at bedside. 

## 2016-10-03 NOTE — Progress Notes (Signed)
[name] paged regarding CT Chest results with ETT lying directly above the carina with recommendation to pull ETT back by 2cm. [name] in agreement. Respiratory called and notified of orders. Will continue to monitor.   Erika PorchBradley Maurice Ramseur

## 2016-10-03 NOTE — ED Notes (Signed)
RN called patient's mother, who states she is disabled herself and needs to rest. She stated that she's been helping take care of her daughter for weeks now. If any questions, she may be reached at [phone]. She states she will be here tomorrow and patient's boyfriend will be here tonight.

## 2016-10-03 NOTE — Progress Notes (Signed)
eLink Physician-Brief Progress Note Patient Name: Erika Boyer DOB: 10-17-1995 MRN: [id number]   Date of Service  10/03/2016  HPI/Events of Note  ETT at the level of the carina on Chest CT Scan.   eICU Interventions  Will pull ETT back by 2 cm and re-secure.      Intervention Category Major Interventions: Other:  Lenell AntuSommer,Steven Eugene 10/03/2016, 10:50 PM

## 2016-10-03 NOTE — Progress Notes (Signed)
eLink Physician-Brief Progress Note Patient Name: Erika Boyer DOB: [birth year] MRN: [id number]   Date of Service  10/03/2016  HPI/Events of Note  Multiple issues: 1. Lactic Acid = 2.62 >> 2.59 >> 2.3 and Troponin = 0.05.  eICU Interventions  Will order: 1. ASA 81 mg per tube now and Q day. 2. Continue to trend Troponin. 3. Continue to trend Lactic Acid.     Intervention Category Major Interventions: Acid-Base disturbance - evaluation and management Intermediate Interventions: Diagnostic test evaluation  Sommer,Steven Eugene 10/03/2016, 9:27 PM

## 2016-10-03 NOTE — H&P (Signed)
History and Physical    Erika Boyer SEL:[id number] DOB: [birth year] DOA: 10/03/2016   PCP: [name], PA-C   Patient coming from/Resides with: Private residence/mother  Admission status: Inpatient/SDU-medically necessary to stay a minimum 2 midnights to rule out impending and/or unexpected changes in physiologic status that may differ from initial evaluation performed in the ER and/or at time of admission. Presents critically ill with acute hypoxemic respiratory failure and near white out of left lung field on chest x-ray with an unexplained cardiomegaly. History limited by patient's ability to participate and the patient's mother has left the hospital. Patient in acute respiratory distress with increased work of breathing and tachycardia prompting immediate placement of BiPAP, she will likely transition to a higher level of care in the ICU and potentially could require intubation. She will require (at a minimum) a CT of the chest to better clarify the abnormalities on chest x-ray(effusion). Given her enlarged heart and fever with the suspected source considered to be pneumonia. Unfortunately we cannot exclude underlying heart failure/possible viral cardiomyopathy at time of admission. She will require close monitoring of hemodynamic status, telemetry, strict I/O, IV antibiotics, inpatient echocardiogram and assistance with ADLs.  Chief Complaint: Shortness of breath  HPI: Erika Boyer is a 21 y.o. female with medical history significant for Graves' disease on Tapazole, obesity, and cerebral atrophy. Patient recently moved to the [place], [place] area from [facility] and at this time no medical records are available. The patient is accompanied by her mother and both the patient and the mother are poor historians. Apparently this patient had been having some sort of infectious process including the lungs for "some time now" this prompted the mother to give the patient  left over antibiotics. Apparently the symptoms have been worsening over the past 3 days. Due to some confusion when the initial call to EMS was made (sounds like a language barrier issue on the mother's behalf) it was initially reported that the patient had an allergic reaction and she was given epinephrine. She was also given Solu-Medrol, albuterol and Atrovent. Upon arrival to the ER the patient denied fevers, abdominal pain, diarrhea or urinary symptoms, neck pain or headache. When I spoke with the patient she thinks that she began getting sick last week and has had some emesis. She is unsure if she's been having fevers. Upon my evaluation of the patient she had increased work of breathing with respiratory rates ranging between 35 and 45 breast per minute, resting heart rate 136 despite having received volume resuscitation and antibiotics in the ER. She had a MAXIMUM TEMPERATURE of 103.1 in the ER. A urine sample has not yet been obtained. Chest x-ray revealed near white out of the left lung field with a small effusion on the right base as well. On exam, the patient was not moving air on the left side and had faint expiratory crackles with poor air movement on the right. She was on 100% nonrebreather upon my assessment and I immediately called for her RN and asked her to notify respiratory therapy that BiPAP was required for this patient stat. Her mother has left the hospital according to the patient's nurse. The nurse verbalize concerns over the patient's ability to care for herself independently despite the mothers ascertation she is that "she is 49 years old and should be moving out and living on her own by now". (RN reported this to me)  ED Course:  Vital Signs: BP 129/75   Pulse (!) 125  Temp 98.4 F (36.9 C) (Axillary)   Resp (!) 25   Ht 5' 5"  (1.651 m)   Wt 117.9 kg (260 lb)   SpO2 99%   BMI 43.27 kg/m  PCXR: Large left-sided pleural effusion with likely underlying infiltrate; I also  appreciate what appears to be a small right pleural effusion swell significant cardiomegaly Lab data: Sodium 137, potassium 3.3, chloride 107, CO2 19, glucose 118, BUN 18, creatinine 1.46, calcium 7.9, albumin 2.5, total bilirubin 1.4, lactic acid 2.6 to, white count 13,500 with neutrophils 81% and absolute neutrophils 11%, hemoglobin 9.5, MCV 70.2, platelets 241,000, TSH 7.093 with free T4 0.9, blood cultures obtained in the ER, urinalysis and culture ordered but pending collection ABG: PH 7.44 PCO2 23.7, PO2 54, TC 217, a be 6, bicarbonate 15.9, O2 saturation 95% Medications and treatments: Normal saline bolus 1 L, Tylenol 650 mg 1, Rocephin 1 g IV 1, Zithromax 5 mg IV 1  Review of Systems:  **Able to obtain accurate/adequate review of systems given patient's inability to participate and mother no longer at bedside Majority of review of systems obtained from the medical record and from the nurse at the bedside   Past Medical History:  Diagnosis Date  . Cerebellar degeneration     Past Surgical History:  Procedure Laterality Date  . BREAST REDUCTION SURGERY    . TONSILLECTOMY      Social History   Social History  . Marital status: Single    Spouse name: N/A  . Number of children: N/A  . Years of education: N/A   Occupational History  . Not on file.   Social History Main Topics  . Smoking status: Never Smoker  . Smokeless tobacco: Never Used  . Alcohol use Not on file  . Drug use: Unknown  . Sexual activity: Not on file   Other Topics Concern  . Not on file   Social History Narrative  . No narrative on file    Mobility: Unknown-patient reports fully ambulatory prior to admission Work history: Disabled   Allergies  Allergen Reactions  . Morphine And Related Itching     Family historyUnable to be review secondary to patient's inability to participate accurately due to underlying chronic neurological condition as well as acute altered mentation from  sepsis  Prior to Admission medications   Medication Sig Start Date End Date Taking? Authorizing Provider  methimazole (TAPAZOLE) 10 MG tablet Take 1.5 tablets (15 mg total) by mouth daily. 08/23/16   [name], PA-C    Physical Exam: Vitals:   10/03/16 1600 10/03/16 1610 10/03/16 1635 10/03/16 1645  BP: 137/86  118/79 129/75  Pulse: (!) 135  (!) 128 (!) 125  Resp: (!) 25  (!) 25   Temp:   98.4 F (36.9 C)   TempSrc:   Axillary   SpO2: 100% 100% 100% 99%  Weight:      Height:          Constitutional: Quite toxic appearing with increased work of breathing and respiratory distress, diaphoretic Eyes: PERRL, lids and conjunctivae normal ENMT: Mucous membranes are moist. Posterior pharynx clear of any exudate or lesions.  Neck: normal, supple, no masses, no thyromegaly Respiratory: Marked diminished breath sounds throughout worse on the left with fine expiratory crackles mid fields down on right, respiratory rate between 35 and 45 breaths per minute, increased work of breathing with use of accessory muscles to breathe, 100% NRB mask-subsequently after my arrival has been changed to BiPAP per RT Cardiovascular:  Regular tachycardic rate in the 130s at rest, no murmurs / rubs / gallops. No extremity edema but somewhat difficult to ascertain given underlying obesity. 2+ pedal pulses. No carotid bruits.  Abdomen: no tenderness, no masses palpated. No hepatosplenomegaly. Bowel sounds positive.  Musculoskeletal: no clubbing / cyanosis. No joint deformity upper and lower extremities. Good ROM, no contractures. Normal muscle tone.  Skin: no rashes, lesions, ulcers. No induration-she does have irregular shaped linear areas on the lower extremities (I merely the tibial region) that are reddened but not raised and appears somewhat bruise-like and appear consistent with trauma from scratching and extremities Neurologic: CN 2-12 grossly intact based on limited exam. Sensation intact, DTR normal.  Strength 4/5 x all 4 extremities. Able to turn self onto left side independently  Psychiatric: Lethargic but able to participate somewhat in history, appears to be oriented to at least name and place   Labs on Admission: I have personally reviewed following labs and imaging studies  CBC:  Recent Labs Lab 10/03/16 1408  WBC 13.5*  NEUTROABS 11.0*  HGB 9.5*  HCT 29.0*  MCV 70.2*  PLT 916   Basic Metabolic Panel:  Recent Labs Lab 10/03/16 1408  NA 137  K 3.3*  CL 107  CO2 19*  GLUCOSE 118*  BUN 18  CREATININE 1.46*  CALCIUM 7.9*   GFR: Estimated Creatinine Clearance: 79 mL/min (A) (by C-G formula based on SCr of 1.46 mg/dL (H)). Liver Function Tests:  Recent Labs Lab 10/03/16 1408  AST 34  ALT 17  ALKPHOS 41  BILITOT 1.4*  PROT 7.1  ALBUMIN 2.5*   No results for input(s): LIPASE, AMYLASE in the last 168 hours. No results for input(s): AMMONIA in the last 168 hours. Coagulation Profile: No results for input(s): INR, PROTIME in the last 168 hours. Cardiac Enzymes: No results for input(s): CKTOTAL, CKMB, CKMBINDEX, TROPONINI in the last 168 hours. BNP (last 3 results) No results for input(s): PROBNP in the last 8760 hours. HbA1C: No results for input(s): HGBA1C in the last 72 hours. CBG: No results for input(s): GLUCAP in the last 168 hours. Lipid Profile: No results for input(s): CHOL, HDL, LDLCALC, TRIG, CHOLHDL, LDLDIRECT in the last 72 hours. Thyroid Function Tests:  Recent Labs  10/03/16 1408  TSH 7.093*  FREET4 0.91   Anemia Panel: No results for input(s): VITAMINB12, FOLATE, FERRITIN, TIBC, IRON, RETICCTPCT in the last 72 hours. Urine analysis: No results found for: COLORURINE, APPEARANCEUR, LABSPEC, PHURINE, GLUCOSEU, HGBUR, BILIRUBINUR, KETONESUR, PROTEINUR, UROBILINOGEN, NITRITE, LEUKOCYTESUR Sepsis Labs: @LABRCNTIP (procalcitonin:4,lacticidven:4) )No results found for this or any previous visit (from the past 240 hour(s)).   Radiological  Exams on Admission: Dg Chest Port 1 View  Result Date: 10/03/2016 CLINICAL DATA:  Shortness of breath and fevers EXAM: PORTABLE CHEST 1 VIEW COMPARISON:  None. FINDINGS: Cardiac shadow is prominent. Large left-sided pleural effusion is noted. There is likely underlying infiltrate present. No bony abnormality is noted. The right lung is well aerated without focal infiltrate. IMPRESSION: Large left-sided pleural effusion with likely underlying infiltrate. Electronically Signed   By: Inez Catalina M.D.   On: 10/03/2016 14:04    EKG: (Independently reviewed) ordered but does not appear to have been completed  Assessment/Plan Principal Problem:   Sepsis  -Presents critically ill with shortness of breath and abnormal chest x-ray with large left pleural effusion, fever 103 with leukocytosis and presumed pneumonia etiology -Urinalysis/cx pending -Continue empiric Rocephin/Zithromax -Follow up on blood cultures -Tachycardic but maintaining systolic blood pressure greater than 90 and  MAP greater than 65 so no indication for 30 mL/KG volume resuscitation at this juncture -Cycle lactic acid -Anticipate will transition to ICU level of care later this evening -Procalcitonin  Active Problems:   Acute respiratory failure with hypoxia  -ABG with PO70 2 in a 21 year old patient with no underlying lung disease -Continue supportive care with BiPAP -Low threshold that patient will require intubation given increased work of breathing and persistent tachypnea and tachycardia -PCCM consulted and will see the patient ASAP-RN instructed that patient is to not leave the ER until evaluated by the critical care team ** 1730: PCCM to move pt to trauma/ICU room to intubate and perform thoracentesis    CAP (community acquired pneumonia) -Appears to have parapneumonic effusion in the setting of fever, although unclear if fever actually coming from pulmonary source vs urinary tract -Urinary Legionella -Influenza  PCR -HIV -ESR -Sputum culture if can obtain -Follow up on blood cultures -Continue empiric antibiotics as above -Supportive care as above -May require thoracentesis; if can tolerate obtain CT of the chest to better clarify effusion-if parapneumonic may require CVTS evaluation for possible VATS procedure    Cardiomegaly -Single view chest x-ray with marked cardiomegaly -Significant portions of past medical history unknown and mother no longer at bedside -Uncertain if patient has long-standing cardiomegaly and therefore may have underlying heart failure related to her hyperthyroidism/tachycardia -Obtain BNP -We'll continue to give IV fluid hydration/resuscitation and setting of sepsis but monitor for progressive worsening of respiratory status -Echocardiogram -Possible this is viral cardiomyopathy    Hyperthyroidism -On Tapazole at home -TSH here just above 7 with normal free T4 -Tapazole on hold given severity of respiratory status and need for NPO status -Current tachycardia could just be appropriate physiologic response to resp distress and sepsis -If tachycardia persists consider beta blocker IV -Does not appear to be in thyroid storm given above TSH    Acute kidney injury  -Baseline renal function in April: 9 and 0.77 -Current renal function: 18 and 1.46 -Hydrate and follow labs -Avoid nephrotoxic medications    Morbid obesity with BMI of 40.0-44.9, adult  -Once medically stable will need nutrition consultation for weight reduction strategies    Cerebellar degeneration -Once patient has recovered we'll need to determine baseline mentation and ability to manage herself i.e. does she have capacity to make her own decisions and manage independent of her mother -May need PT/OT evaluation regarding any mobility issues -May need psychiatric evaluation to determine capacity    Microcytic anemia -Hemoglobin in April was 10.1 with current hemoglobin 9.5 with microcytosis -Check  anemia panel      DVT prophylaxis: Lovenox  Code Status: Full Family Communication: No family at bedside; 1730: Updated boyfriend at bedside and advised him to notify pt mom of need to intubate and move to ICU and need for her to speak w/ PCCM doctors Disposition Plan: To be determined Consults called: PCCM/Summer ([place])    Kailey Esquilin L. ANP-BC [facility] Pager [phone]   If 7PM-7AM, please contact night-coverage [email or web address] Password [email or web address]  10/03/2016, 4:53 PM

## 2016-10-03 NOTE — Procedures (Signed)
Intubation Procedure Note Shivani Barrantes [id number] Oct 14, 1995  Procedure: Intubation Indications: Airway protection and maintenance  Procedure Details Consent: Risks of procedure as well as the alternatives and risks of each were explained to the (patient/caregiver).  Consent for procedure obtained. Time Out: Verified patient identification, verified procedure, site/side was marked, verified correct patient position, special equipment/implants available, medications/allergies/relevent history reviewed, required imaging and test results available.  Performed  Premedicated with fentanyl, versed, etomidate, and rocuronium.    Maximum sterile technique was used including gloves, gown, hand hygiene and mask.  MAC and 3 glideoscope used.  7.5 ETT placed at 22 cm at the lip with positive ETCO2 color change, equal breath sounds and absent epigastric sounds.    Evaluation Hemodynamic Status: BP stable throughout; O2 sats: transiently fell during during procedure, improved now. Patient's Current Condition: stable Complications: No apparent complications Patient did tolerate procedure well. Chest X-ray ordered to verify placement.  CXR: pending.  Procedure performed under direct supervision of Dr. Meliton Rattan, AGACNP-BC [place] Pgr: [phone] or if no answer [phone] 10/03/2016, 7:16 PM

## 2016-10-03 NOTE — ED Provider Notes (Signed)
Medical screening examination/treatment/procedure(s) were conducted as a shared visit with non-physician practitioner(s) and myself.  I personally evaluated the patient during the encounter.   EKG Interpretation None      Results for orders placed or performed during the hospital encounter of 10/03/16  Comprehensive metabolic panel  Result Value Ref Range   Sodium 137 135 - 145 mmol/L   Potassium 3.3 (L) 3.5 - 5.1 mmol/L   Chloride 107 101 - 111 mmol/L   CO2 19 (L) 22 - 32 mmol/L   Glucose, Bld 118 (H) 65 - 99 mg/dL   BUN 18 6 - 20 mg/dL   Creatinine, Ser 1.61 (H) 0.44 - 1.00 mg/dL   Calcium 7.9 (L) 8.9 - 10.3 mg/dL   Total Protein 7.1 6.5 - 8.1 g/dL   Albumin 2.5 (L) 3.5 - 5.0 g/dL   AST 34 15 - 41 U/L   ALT 17 14 - 54 U/L   Alkaline Phosphatase 41 38 - 126 U/L   Total Bilirubin 1.4 (H) 0.3 - 1.2 mg/dL   GFR calc non Af Amer 51 (L) >60 mL/min   GFR calc Af Amer 59 (L) >60 mL/min   Anion gap 11 5 - 15  CBC WITH DIFFERENTIAL  Result Value Ref Range   WBC 13.5 (H) 4.0 - 10.5 K/uL   RBC 4.13 3.87 - 5.11 MIL/uL   Hemoglobin 9.5 (L) 12.0 - 15.0 g/dL   HCT 09.6 (L) 04.5 - 40.9 %   MCV 70.2 (L) 78.0 - 100.0 fL   MCH 23.0 (L) 26.0 - 34.0 pg   MCHC 32.8 30.0 - 36.0 g/dL   RDW 81.1 (H) 91.4 - 78.2 %   Platelets 241 150 - 400 K/uL   Neutrophils Relative % PENDING %   Neutro Abs PENDING 1.7 - 7.7 K/uL   Band Neutrophils PENDING %   Lymphocytes Relative PENDING %   Lymphs Abs PENDING 0.7 - 4.0 K/uL   Monocytes Relative PENDING %   Monocytes Absolute PENDING 0.1 - 1.0 K/uL   Eosinophils Relative PENDING %   Eosinophils Absolute PENDING 0.0 - 0.7 K/uL   Basophils Relative PENDING %   Basophils Absolute PENDING 0.0 - 0.1 K/uL   WBC Morphology PENDING    RBC Morphology PENDING    Smear Review PENDING    nRBC PENDING 0 /100 WBC   Metamyelocytes Relative PENDING %   Myelocytes PENDING %   Promyelocytes Absolute PENDING %   Blasts PENDING %  TSH  Result Value Ref Range   TSH  7.093 (H) 0.350 - 4.500 uIU/mL  T4, free  Result Value Ref Range   Free T4 0.91 0.61 - 1.12 ng/dL  I-Stat CG4 Lactic Acid, ED  (not at  [facility])  Result Value Ref Range   Lactic Acid, Venous 2.62 (HH) 0.5 - 1.9 mmol/L   Comment NOTIFIED PHYSICIAN   I-Stat arterial blood gas, ED  Result Value Ref Range   pH, Arterial 7.444 7.350 - 7.450   pCO2 arterial 23.7 (L) 32.0 - 48.0 mmHg   pO2, Arterial 54.0 (L) 83.0 - 108.0 mmHg   Bicarbonate 15.9 (L) 20.0 - 28.0 mmol/L   TCO2 17 0 - 100 mmol/L   O2 Saturation 85.0 %   Acid-base deficit 6.0 (H) 0.0 - 2.0 mmol/L   Patient temperature 103.1 F    Collection site RADIAL, ALLEN'S TEST ACCEPTABLE    Drawn by Operator    Sample type ARTERIAL    Dg Chest Port 1 View  Result Date:  10/03/2016 CLINICAL DATA:  Shortness of breath and fevers EXAM: PORTABLE CHEST 1 VIEW COMPARISON:  None. FINDINGS: Cardiac shadow is prominent. Large left-sided pleural effusion is noted. There is likely underlying infiltrate present. No bony abnormality is noted. The right lung is well aerated without focal infiltrate. IMPRESSION: Large left-sided pleural effusion with likely underlying infiltrate. Electronically Signed   By: Alcide CleverMark  Lukens M.D.   On: 10/03/2016 14:04   Xr Ankle Complete Right  Result Date: 09/21/2016 3 views right ankle shows the talus well located within the ankle mortise no diastases. No acute fractures no bony abnormalities  Xr Foot Complete Right  Result Date: 09/21/2016 3 views right foot: No acute fracture. Hallux valgus deformity. Slight Morton's type foot with the second and third metatarsals being longer first. No bony abnormalities otherwise.  Xr Knee 1-2 Views Right  Result Date: 09/21/2016 Right knee AP lateral views: No acute fracture. Medial lateral joint lines are well maintained. Patellofemoral joint appears to be well maintained. No joint effusion.    Patient brought in by EMS for supposedly allergic reaction. Patient was given epinephrine.  Arrived here with significant sinus tachycardia 05/21/1948. Probably secondary to that. X-ray shows pneumonia pleural effusion on the left side. Patient's other present in vital signs consistent with sepsis plus had a market fever. Patient started on sepsis protocol. Given antibiotics for pneumonia. Patient also started on BiPAP. Patient states that she has been having shortness of breath for 3 days working hard to breathe for 3 days. Patient's respiratory rate was up here. Patient tolerating the BiPAP well. Patient will require admission to the medicine service stepdown unit.  Patient alert. Lactic acid elevated. Blood gas shows no evidence of acidosis or CO2 retention.  CRITICAL CARE Performed by: Vanetta MuldersZACKOWSKI,Mamie Hundertmark Total critical care time: 30  minutes Critical care time was exclusive of separately billable procedures and treating other patients. Critical care was necessary to treat or prevent imminent or life-threatening deterioration. Critical care was time spent personally by me on the following activities: development of treatment plan with patient and/or surrogate as well as nursing, discussions with consultants, evaluation of patient's response to treatment, examination of patient, obtaining history from patient or surrogate, ordering and performing treatments and interventions, ordering and review of laboratory studies, ordering and review of radiographic studies, pulse oximetry and re-evaluation of patient's condition.   Vanetta MuldersZackowski, Chace Bisch, MD 10/03/16 [phone]

## 2016-10-03 NOTE — Progress Notes (Signed)
CRITICAL VALUE ALERT  Critical value received:  Lactic Acid 2.3/Troponin 0.05  Date of notification: 10/03/16   Time of notification: 2100  Critical value read back: Yes  Nurse who received alert:  Feliberto GottronBrad Karole Oo RN  MD notified (1st page):  Arsenio LoaderSommer MD  Time of first page:  2130  MD notified (2nd page):  Time of second page:  Responding MD:  Arsenio LoaderSommer MD  Time MD responded:  2130

## 2016-10-03 NOTE — ED Provider Notes (Signed)
[facility]-EMERGENCY DEPT Provider Note   CSN: [id number] Arrival date & time: 10/03/16  1325     History   Chief Complaint Chief Complaint  Patient presents with  . Allergic Reaction  . Shortness of Breath    HPI Erika Boyer is a 21 y.o. female.  Erika Boyer is a 21 y.o. Female with a history of Graves' disease and cerebral atrophy presents to the emergency department complaining of coughing and shortness of breath for the past 3 days. She was not aware she had a fever until arrival to the emergency department. She is a poor historian. She reports some increased leg swelling for the past two weeks. She reports redness to her bilateral legs and that this sometimes is from an allergic reaction. She reports one episode of vomiting today. She was given 0.3 mg epinephrine, solumedrol, albuterol and atrovent by EMS prior to arrival. She denies fevers, abdominal pain, diarrhea, urinary symptoms, neck pain, syncope, or headache.    The history is provided by the patient and medical records. No language interpreter was used.  Allergic Reaction  Presenting symptoms: rash and wheezing   Shortness of Breath  Associated symptoms include a fever, sore throat, cough, wheezing, vomiting, rash and leg swelling. Pertinent negatives include no headaches, no neck pain, no chest pain and no abdominal pain.    Past Medical History:  Diagnosis Date  . Cerebellar degeneration     Patient Active Problem List   Diagnosis Date Noted  . Graves disease 08/23/2016    Past Surgical History:  Procedure Laterality Date  . BREAST REDUCTION SURGERY    . TONSILLECTOMY      OB History    No data available       Home Medications    Prior to Admission medications   Medication Sig Start Date End Date Taking? Authorizing Provider  methimazole (TAPAZOLE) 10 MG tablet Take 1.5 tablets (15 mg total) by mouth daily. 08/23/16   Loletta Specter, PA-C    Family History No family history on  file.  Social History Social History  Substance Use Topics  . Smoking status: Never Smoker  . Smokeless tobacco: Never Used  . Alcohol use Not on file     Allergies   Morphine and related   Review of Systems Review of Systems  Constitutional: Positive for fatigue and fever.  HENT: Positive for sore throat. Negative for congestion.   Eyes: Negative for visual disturbance.  Respiratory: Positive for cough, shortness of breath and wheezing.   Cardiovascular: Positive for leg swelling. Negative for chest pain and palpitations.  Gastrointestinal: Positive for vomiting. Negative for abdominal pain, diarrhea and nausea.  Genitourinary: Negative for dysuria, flank pain and frequency.  Musculoskeletal: Negative for back pain and neck pain.  Skin: Positive for rash. Negative for wound.  Neurological: Negative for syncope, light-headedness and headaches.     Physical Exam Updated Vital Signs BP 131/86   Pulse (!) 144   Temp (!) 103.1 F (39.5 C) (Oral)   Resp (!) 44   Ht 5\' 5"  (1.651 m)   Wt 117.9 kg   SpO2 100%   BMI 43.27 kg/m   Physical Exam  Constitutional: She is oriented to person, place, and time. She appears well-developed and well-nourished. No distress.  HENT:  Head: Normocephalic and atraumatic.  Right Ear: External ear normal.  Left Ear: External ear normal.  Mouth/Throat: Oropharynx is clear and moist.  Throat is clear. No tongue or lip swelling noted. Uvula is midline without  edema.  Eyes: Conjunctivae are normal. Pupils are equal, round, and reactive to light. Right eye exhibits no discharge. Left eye exhibits no discharge.  Neck: Normal range of motion. Neck supple. No JVD present. No tracheal deviation present.  Cardiovascular: Regular rhythm, normal heart sounds and intact distal pulses.  Exam reveals no gallop and no friction rub.   No murmur heard. Heart rate is 150 on exam. Regular rhythm. Intact pulses.  Pulmonary/Chest: Effort normal. No stridor.  She has no wheezes. She has no rales.  Diminished lung sounds are bilateral bases worse in the left base. Absent breath sounds in left base. Some increased work of breathing with respirations of 30 on exam. No rales or rhonchi. No wheezing on exam noted.  Abdominal: Soft. There is no tenderness. There is no guarding.  Abdomen is soft and nontender to palpation.  Musculoskeletal: Normal range of motion. She exhibits edema. She exhibits no tenderness or deformity.  Bilateral lower extremity edema. Patient is spontaneously moving all extremities in a coordinated fashion exhibiting good strength.   Lymphadenopathy:    She has no cervical adenopathy.  Neurological: She is alert and oriented to person, place, and time. Coordination normal.  Skin: Skin is warm and dry. Capillary refill takes less than 2 seconds. No rash noted. She is not diaphoretic. No erythema. No pallor.  Psychiatric: She has a normal mood and affect. Her behavior is normal.  Nursing note and vitals reviewed.    ED Treatments / Results  Labs (all labs ordered are listed, but only abnormal results are displayed) Labs Reviewed  COMPREHENSIVE METABOLIC PANEL - Abnormal; Notable for the following:       Result Value   Potassium 3.3 (*)    CO2 19 (*)    Glucose, Bld 118 (*)    Creatinine, Ser 1.46 (*)    Calcium 7.9 (*)    Albumin 2.5 (*)    Total Bilirubin 1.4 (*)    GFR calc non Af Amer 51 (*)    GFR calc Af Amer 59 (*)    All other components within normal limits  CBC WITH DIFFERENTIAL/PLATELET - Abnormal; Notable for the following:    WBC 13.5 (*)    Hemoglobin 9.5 (*)    HCT 29.0 (*)    MCV 70.2 (*)    MCH 23.0 (*)    RDW 15.8 (*)    Neutro Abs 11.0 (*)    All other components within normal limits  TSH - Abnormal; Notable for the following:    TSH 7.093 (*)    All other components within normal limits  I-STAT CG4 LACTIC ACID, ED - Abnormal; Notable for the following:    Lactic Acid, Venous 2.62 (*)    All  other components within normal limits  I-STAT ARTERIAL BLOOD GAS, ED - Abnormal; Notable for the following:    pCO2 arterial 23.7 (*)    pO2, Arterial 54.0 (*)    Bicarbonate 15.9 (*)    Acid-base deficit 6.0 (*)    All other components within normal limits  CULTURE, BLOOD (ROUTINE X 2)  CULTURE, BLOOD (ROUTINE X 2)  T4, FREE  URINALYSIS, ROUTINE W REFLEX MICROSCOPIC    EKG  EKG Interpretation None       Radiology Dg Chest Port 1 View  Result Date: 10/03/2016 CLINICAL DATA:  Shortness of breath and fevers EXAM: PORTABLE CHEST 1 VIEW COMPARISON:  None. FINDINGS: Cardiac shadow is prominent. Large left-sided pleural effusion is noted. There is likely underlying infiltrate  present. No bony abnormality is noted. The right lung is well aerated without focal infiltrate. IMPRESSION: Large left-sided pleural effusion with likely underlying infiltrate. Electronically Signed   By: Alcide Clever M.D.   On: 10/03/2016 14:04    Procedures Procedures (including critical care time) CRITICAL CARE Performed by: Lawana Chambers   Total critical care time: 45  minutes  Critical care time was exclusive of separately billable procedures and treating other patients.  Critical care was necessary to treat or prevent imminent or life-threatening deterioration.  Critical care was time spent personally by me on the following activities: development of treatment plan with patient and/or surrogate as well as nursing, discussions with consultants, evaluation of patient's response to treatment, examination of patient, obtaining history from patient or surrogate, ordering and performing treatments and interventions, ordering and review of laboratory studies, ordering and review of radiographic studies, pulse oximetry and re-evaluation of patient's condition.   Medications Ordered in ED Medications  0.9 %  sodium chloride infusion (not administered)  sodium chloride 0.9 % bolus 1,000 mL (0 mLs  Intravenous Stopped 10/03/16 1457)  acetaminophen (TYLENOL) tablet 650 mg (650 mg Oral Given 10/03/16 1357)  cefTRIAXone (ROCEPHIN) 1 g in dextrose 5 % 50 mL IVPB (0 g Intravenous Stopped 10/03/16 1510)  azithromycin (ZITHROMAX) 500 mg in dextrose 5 % 250 mL IVPB (500 mg Intravenous New Bag/Given 10/03/16 1426)     Initial Impression / Assessment and Plan / ED Course  I have reviewed the triage vital signs and the nursing notes.  Pertinent labs & imaging results that were available during my care of the patient were reviewed by me and considered in my medical decision making (see chart for details).    This is a 21 y.o. Female with a history of Graves' disease and cerebral atrophy presents to the emergency department complaining of coughing and shortness of breath for the past 3 days. She was not aware she had a fever until arrival to the emergency department. She is a poor historian. She reports some increased leg swelling for the past two weeks. She reports redness to her bilateral legs and that this sometimes is from an allergic reaction. She reports one episode of vomiting today. She was given 0.3 mg epinephrine, solumedrol, albuterol and atrovent by EMS prior to arrival.  On exam patient is febrile to 103.1. Heart rate is 160. She has increased work of breathing and diminished absent breath sounds in her left base. No wheezing noted. She is receiving breathing treatment by EMS. She has bilateral lower extremity edema noted. No evidence of throat or lip swelling. Uvula is midline. No evidence of anaphylaxis or allergic reaction on my exam. No hives noted. I activated code sepsis immediately at initial evaluation.   Portable chest x-ray reveals a large left-sided pleural effusion with likely underlying infiltrate. Will start on Rocephin and azithromycin for pneumonia.  Lactic acid is elevated at 2.62. Fluids running. Antibiotics started. No severe sepsis. At reevaluation patient's heart rate is  improving to the 140s. Suspect patient's heart rate was somewhat artificially elevated due to epinephrine by EMS. Still having increased work of breathing. Due to this pleural effusion increased work of breathing will start on BiPAP.  Arterial blood gas reveals a arterial pH is 7.444. PCO2 is 23.7.  Patient started on BiPAP and tolerating well at reevaluation.   CBC reveals a white count of 13,500. CMP reveals an acute kidney injury with a creatinine of 1.46. Plan for admission.   Patient and  family agree with plan for admission.   I consulted with Revonda StandardAllison NP from [facility] who accepted the patient for admission.   This patient was discussed with and evaluated by Dr. Deretha EmoryZackowski who agrees with assessment and plan.    Final Clinical Impressions(s) / ED Diagnoses   Final diagnoses:  Sepsis, due to unspecified organism [facility](HCC)  Community acquired pneumonia of left lower lobe of lung (HCC)  Shortness of breath  Pleural effusion, left  AKI (acute kidney injury) [facility](HCC)    New Prescriptions New Prescriptions   No medications on file     Everlene FarrierDansie, Amahd Morino, Cordelia Poche-C 10/03/16 1549    Vanetta MuldersZackowski, Scott, MD 10/05/16 [phone]

## 2016-10-03 NOTE — ED Notes (Signed)
Informed RN Nehemiah SettleBrooke of Lactic acid result Dr.E Dalene SeltzerSchlossman is with Level 1

## 2016-10-03 NOTE — H&P (Signed)
PULMONARY / CRITICAL CARE MEDICINE   Name: Erika Boyer MRN: [id number] DOB: 10/26/1995    ADMISSION DATE:  10/03/2016 CONSULTATION DATE:  10/03/16  REFERRING MD:  Teresa CoombsMerrell D, MD  CHIEF COMPLAINT:  Respiratory distress  HISTORY OF PRESENT ILLNESS:   21 year old with history of Graves' disease on methimazole, cerebral atrophy. Brought to the ED by her mother with 3 days of worsening dyspnea. Found to have temperature of 103 and a chest x-ray with large left pleural effusion, likely from severe community acquired pneumonia or viral illness. Also complains of increasing leg swelling for the past 2 weeks with redness, 1 episode of vomiting today. This was thought to be allergic reaction she was given 0.2 mg epinephrine, Solu-Medrol, albuterol and Atrovent by EMS. She continues to have significant respiratory distress and was placed on BiPAP and PCCM called for consultation. Noted to have elevated lactic acid, temperature of 103 with elevated WBC count.  She has a history of Graves' disease and cerebral atrophy. [place] and is establishing care here. There are no records in the system review. As per her mother she had a viral illness at age of 21 which caused cerebral atrophy. She has mental retardation, periods of agitation secondary to this and she has been seen by multiple neurologists prior to her move to [place].  PAST MEDICAL HISTORY :  She  has a past medical history of Cerebellar degeneration.  PAST SURGICAL HISTORY: She  has a past surgical history that includes Tonsillectomy and Breast reduction surgery.  Allergies  Allergen Reactions  . Morphine And Related Itching    No current facility-administered medications on file prior to encounter.    Current Outpatient Prescriptions on File Prior to Encounter  Medication Sig  . methimazole (TAPAZOLE) 10 MG tablet Take 1.5 tablets (15 mg total) by mouth daily.    FAMILY HISTORY:  Her  has no family status information on file.    SOCIAL HISTORY: She  reports that she has never smoked. She has never used smokeless tobacco.  REVIEW OF SYSTEMS:   Unable to obtain as patient is on BiPAP, altered  SUBJECTIVE:    VITAL SIGNS: BP 129/75   Pulse (!) 125   Temp 98.4 F (36.9 C) (Axillary)   Resp (!) 25   Ht 5\' 5"  (1.651 m)   Wt 260 lb (117.9 kg)   SpO2 99%   BMI 43.27 kg/m   HEMODYNAMICS:    VENTILATOR SETTINGS: Vent Mode: BIPAP FiO2 (%):  [40 %] 40 % Set Rate:  [15 bmp] 15 bmp PEEP:  [5 cmH20] 5 cmH20  INTAKE / OUTPUT: No intake/output data recorded.  PHYSICAL EXAMINATION: Gen:      Moderate distress HEENT:  EOMI, sclera anicteric Neck:     No masses; no thyromegaly Lungs:    Tachypnea, reduced breath sounds on right; Increased work of breathing CV:         Tachycardia; no murmurs Abd:      + bowel sounds; soft, non-tender; no palpable masses, no distension Ext:    No edema; adequate peripheral perfusion Skin:      Warm and dry; no rash Neuro: No focal deficits, moves all extremities  LABS:  BMET  Recent Labs Lab 10/03/16 1408  NA 137  K 3.3*  CL 107  CO2 19*  BUN 18  CREATININE 1.46*  GLUCOSE 118*    Electrolytes  Recent Labs Lab 10/03/16 1408  CALCIUM 7.9*    CBC  Recent Labs Lab 10/03/16 1408  WBC 13.5*  HGB 9.5*  HCT 29.0*  PLT 241    Coag's No results for input(s): APTT, INR in the last 168 hours.  Sepsis Markers  Recent Labs Lab 10/03/16 1432  LATICACIDVEN 2.62*    ABG  Recent Labs Lab 10/03/16 1446  PHART 7.444  PCO2ART 23.7*  PO2ART 54.0*    Liver Enzymes  Recent Labs Lab 10/03/16 1408  AST 34  ALT 17  ALKPHOS 41  BILITOT 1.4*  ALBUMIN 2.5*    Cardiac Enzymes No results for input(s): TROPONINI, PROBNP in the last 168 hours.  Glucose No results for input(s): GLUCAP in the last 168 hours.  Imaging  STUDIES:  CXR 5/16 > significant cardio megaly with large left  effusion  CULTURES: Bcx 5/16 >  ANTIBIOTICS: Ceftriaxone 5/16 > Azithro 5/16 >  SIGNIFICANT EVENTS: 5/16 > admit with resp failure, intubated  LINES/TUBES: ETT 5/16 >   DISCUSSION: 21 year old with cerebral atrophy, Graves' disease admitted with acute respiratory failure, sepsis. Source appears to be left  community acquired pneumonia with an associated large effusion. Her heart also appears to be enlarged on the CXR so we need to assess for pericardial effusion, pericarditis in setting of viral illness.   She'll need a CT of the chest and thoracentesis. It does not appear to be safe to do this in current state. She'll need intubation and stabilization now I have discussed this with the mom and she is OK with this. She is requesting a neurology consult as well due to her underlying issues with cerebral atrophy  ASSESSMENT / PLAN:  PULMONARY A: Acute resp failure Large left effusion CAP P:   Intubate Follow CXR and ABG CT chest without contrast Assess for thoracentesis based on CT results Continue abx coverage with ceftriaxone and azithromycin Continue solumedrol 40 mg q12 for now.   CARDIOVASCULAR A:  Tachycardia Cardiomegaly on CXR. Needs eval for pericardial effusion Elevated LA P:  Echocardiogram Check troponin and EKG  RENAL A:   AKI P:   Follow urine output and Cr  GASTROINTESTINAL A:   No issues P:   Keep NPO for now  HEMATOLOGIC A:   Leukocytosis, likely from sepsis P:  Follow urine output and Cr  INFECTIOUS A:   Sepsis CAP P:   Abx as above Follow cultures and RVP, flu test. Check UA  ENDOCRINE A:   Graves disease TSH elevated to 7 P:   Hold methimazole for now Follow TSH, free T4, T3 levels  NEUROLOGIC A:   Encephalopathy Cerebral atrophy P:   RASS goal: 0 Propofol for sedation after intubation  FAMILY  - Updates: Mother updated over the phone. - Inter-disciplinary family meet or Palliative Care meeting due by:   5/23  The patient is critically ill with multiple organ system failure and requires high complexity decision making for assessment and support, frequent evaluation and titration of therapies, advanced monitoring, review of radiographic studies and interpretation of complex data.   Critical Care Time devoted to patient care services, exclusive of separately billable procedures, described in this note is 35 minutes.   Chilton Greathouse MD [place] Pager [phone] If no answer or after 3pm call: [phone] 10/03/2016, 5:53 PM

## 2016-10-04 ENCOUNTER — Inpatient Hospital Stay (HOSPITAL_COMMUNITY): Payer: Medicare Other

## 2016-10-04 ENCOUNTER — Encounter (HOSPITAL_COMMUNITY)
Admission: EM | Disposition: A | Discharge status: Rehabilitation Facility | Payer: Self-pay | Source: Home / Self Care | Attending: Pulmonary Disease

## 2016-10-04 DIAGNOSIS — M7989 Other specified soft tissue disorders: Secondary | ICD-10-CM

## 2016-10-04 DIAGNOSIS — N179 Acute kidney failure, unspecified: Secondary | ICD-10-CM

## 2016-10-04 DIAGNOSIS — J9600 Acute respiratory failure, unspecified whether with hypoxia or hypercapnia: Secondary | ICD-10-CM

## 2016-10-04 DIAGNOSIS — I360 Nonrheumatic tricuspid (valve) stenosis: Secondary | ICD-10-CM

## 2016-10-04 DIAGNOSIS — J90 Pleural effusion, not elsewhere classified: Secondary | ICD-10-CM

## 2016-10-04 DIAGNOSIS — Z9889 Other specified postprocedural states: Secondary | ICD-10-CM

## 2016-10-04 HISTORY — PX: PERICARDIOCENTESIS: CATH118255

## 2016-10-04 LAB — CBC
HEMATOCRIT: 25.2 % — AB (ref 36.0–46.0)
Hemoglobin: 8 g/dL — ABNORMAL LOW (ref 12.0–15.0)
MCH: 22.2 pg — AB (ref 26.0–34.0)
MCHC: 31.7 g/dL (ref 30.0–36.0)
MCV: 69.8 fL — AB (ref 78.0–100.0)
Platelets: 189 10*3/uL (ref 150–400)
RBC: 3.61 MIL/uL — ABNORMAL LOW (ref 3.87–5.11)
RDW: 15.7 % — AB (ref 11.5–15.5)
WBC: 9.1 10*3/uL (ref 4.0–10.5)

## 2016-10-04 LAB — RESPIRATORY PANEL BY PCR
Adenovirus: NOT DETECTED
BORDETELLA PERTUSSIS-RVPCR: NOT DETECTED
CHLAMYDOPHILA PNEUMONIAE-RVPPCR: NOT DETECTED
CORONAVIRUS 229E-RVPPCR: NOT DETECTED
CORONAVIRUS HKU1-RVPPCR: NOT DETECTED
Coronavirus NL63: NOT DETECTED
Coronavirus OC43: NOT DETECTED
INFLUENZA B-RVPPCR: NOT DETECTED
Influenza A: NOT DETECTED
METAPNEUMOVIRUS-RVPPCR: NOT DETECTED
Mycoplasma pneumoniae: NOT DETECTED
Parainfluenza Virus 1: NOT DETECTED
Parainfluenza Virus 2: NOT DETECTED
Parainfluenza Virus 3: NOT DETECTED
Parainfluenza Virus 4: NOT DETECTED
RESPIRATORY SYNCYTIAL VIRUS-RVPPCR: NOT DETECTED
RHINOVIRUS / ENTEROVIRUS - RVPPCR: NOT DETECTED

## 2016-10-04 LAB — HIV ANTIBODY (ROUTINE TESTING W REFLEX): HIV Screen 4th Generation wRfx: NONREACTIVE

## 2016-10-04 LAB — BODY FLUID CELL COUNT WITH DIFFERENTIAL
LYMPHS FL: 1 %
Lymphs, Fluid: 3 %
MONOCYTE-MACROPHAGE-SEROUS FLUID: 45 % — AB (ref 50–90)
MONOCYTE-MACROPHAGE-SEROUS FLUID: 21 % — AB (ref 50–90)
Neutrophil Count, Fluid: 54 % — ABNORMAL HIGH (ref 0–25)
Neutrophil Count, Fluid: 76 % — ABNORMAL HIGH (ref 0–25)
Total Nucleated Cell Count, Fluid: 4550 cu mm — ABNORMAL HIGH (ref 0–1000)
Total Nucleated Cell Count, Fluid: 1125 cu mm — ABNORMAL HIGH (ref 0–1000)

## 2016-10-04 LAB — POCT I-STAT 3, ART BLOOD GAS (G3+)
ACID-BASE DEFICIT: 5 mmol/L — AB (ref 0.0–2.0)
Bicarbonate: 18.4 mmol/L — ABNORMAL LOW (ref 20.0–28.0)
O2 Saturation: 95 %
PH ART: 7.416 (ref 7.350–7.450)
PO2 ART: 70 mmHg — AB (ref 83.0–108.0)
Patient temperature: 97.5
TCO2: 19 mmol/L (ref 0–100)
pCO2 arterial: 28.5 mmHg — ABNORMAL LOW (ref 32.0–48.0)

## 2016-10-04 LAB — ECHOCARDIOGRAM COMPLETE
Height: 65 in
Weight: 4130.54 oz

## 2016-10-04 LAB — GLUCOSE, CAPILLARY
Glucose-Capillary: 105 mg/dL — ABNORMAL HIGH (ref 65–99)
Glucose-Capillary: 148 mg/dL — ABNORMAL HIGH (ref 65–99)
Glucose-Capillary: 175 mg/dL — ABNORMAL HIGH (ref 65–99)

## 2016-10-04 LAB — RENAL FUNCTION PANEL
Albumin: 2.2 g/dL — ABNORMAL LOW (ref 3.5–5.0)
Anion gap: 8 (ref 5–15)
BUN: 30 mg/dL — AB (ref 6–20)
CALCIUM: 7.6 mg/dL — AB (ref 8.9–10.3)
CO2: 18 mmol/L — AB (ref 22–32)
CREATININE: 2.1 mg/dL — AB (ref 0.44–1.00)
Chloride: 109 mmol/L (ref 101–111)
GFR calc Af Amer: 38 mL/min — ABNORMAL LOW (ref >60)
GFR calc non Af Amer: 33 mL/min — ABNORMAL LOW (ref >60)
GLUCOSE: 125 mg/dL — AB (ref 65–99)
Phosphorus: 3.9 mg/dL (ref 2.5–4.6)
Potassium: 4.1 mmol/L (ref 3.5–5.1)
SODIUM: 135 mmol/L (ref 135–145)

## 2016-10-04 LAB — PROCALCITONIN: Procalcitonin: 2.5 ng/mL

## 2016-10-04 LAB — BASIC METABOLIC PANEL
Anion gap: 12 (ref 5–15)
BUN: 26 mg/dL — AB (ref 6–20)
CALCIUM: 7.6 mg/dL — AB (ref 8.9–10.3)
CO2: 17 mmol/L — AB (ref 22–32)
Chloride: 105 mmol/L (ref 101–111)
Creatinine, Ser: 1.9 mg/dL — ABNORMAL HIGH (ref 0.44–1.00)
GFR calc Af Amer: 43 mL/min — ABNORMAL LOW (ref >60)
GFR calc non Af Amer: 37 mL/min — ABNORMAL LOW (ref >60)
GLUCOSE: 133 mg/dL — AB (ref 65–99)
Potassium: 4.2 mmol/L (ref 3.5–5.1)
Sodium: 134 mmol/L — ABNORMAL LOW (ref 135–145)

## 2016-10-04 LAB — MAGNESIUM
Magnesium: 2 mg/dL (ref 1.7–2.4)
Magnesium: 2 mg/dL (ref 1.7–2.4)

## 2016-10-04 LAB — CHOLESTEROL, TOTAL: Cholesterol: 82 mg/dL (ref 0–200)

## 2016-10-04 LAB — PROTEIN, PLEURAL OR PERITONEAL FLUID: Total protein, fluid: 4.1 g/dL

## 2016-10-04 LAB — GRAM STAIN

## 2016-10-04 LAB — INFLUENZA PANEL BY PCR (TYPE A & B)
INFLAPCR: NEGATIVE
INFLBPCR: NEGATIVE

## 2016-10-04 LAB — C-REACTIVE PROTEIN: CRP: 19.4 mg/dL — ABNORMAL HIGH (ref <1.0)

## 2016-10-04 LAB — TROPONIN I: Troponin I: <0.03 ng/mL (ref <0.03)

## 2016-10-04 LAB — LACTIC ACID, PLASMA: LACTIC ACID, VENOUS: 0.9 mmol/L (ref 0.5–1.9)

## 2016-10-04 LAB — PHOSPHORUS
Phosphorus: 4.2 mg/dL (ref 2.5–4.6)
Phosphorus: 3.9 mg/dL (ref 2.5–4.6)

## 2016-10-04 LAB — LACTATE DEHYDROGENASE, PLEURAL OR PERITONEAL FLUID: LD FL: 212 U/L — AB (ref 3–23)

## 2016-10-04 LAB — LACTATE DEHYDROGENASE: LDH: 184 U/L (ref 98–192)

## 2016-10-04 LAB — STREP PNEUMONIAE URINARY ANTIGEN: Strep Pneumo Urinary Antigen: NEGATIVE

## 2016-10-04 LAB — PROTEIN, TOTAL: Total Protein: 5.8 g/dL — ABNORMAL LOW (ref 6.5–8.1)

## 2016-10-04 SURGERY — PERICARDIOCENTESIS
Anesthesia: LOCAL

## 2016-10-04 MED ORDER — PRO-STAT SUGAR FREE PO LIQD: 30.0000 mL | Freq: Two times a day (BID) | ORAL | Status: DC

## 2016-10-04 MED ORDER — LIDOCAINE HCL (PF) 1 % IJ SOLN
INTRAMUSCULAR | Status: DC | PRN
  Administered 2016-10-04: 15 mL

## 2016-10-04 MED ORDER — SODIUM CHLORIDE 0.9% FLUSH: 10.0000 mL | INTRAVENOUS | Status: DC | PRN

## 2016-10-04 MED ORDER — ADULT MULTIVITAMIN LIQUID CH
15.0000 mL | Freq: Every day | ORAL | Status: DC
  Administered 2016-10-04 – 2016-10-05 (×2): 15 mL
  Filled 2016-10-04 (×2): qty 15

## 2016-10-04 MED ORDER — CHLORHEXIDINE GLUCONATE 0.12% ORAL RINSE (MEDLINE KIT)
15.0000 mL | Freq: Two times a day (BID) | OROMUCOSAL | Status: DC
  Administered 2016-10-05: 15 mL via OROMUCOSAL

## 2016-10-04 MED ORDER — COLCHICINE 0.6 MG PO TABS
0.6000 mg | ORAL_TABLET | Freq: Two times a day (BID) | ORAL | Status: DC
  Administered 2016-10-04 – 2016-10-09 (×10): 0.6 mg via ORAL
  Filled 2016-10-04 (×10): qty 1

## 2016-10-04 MED ORDER — HEPARIN (PORCINE) IN NACL 2-0.9 UNIT/ML-% IJ SOLN
INTRAMUSCULAR | Status: AC
  Filled 2016-10-04: qty 500

## 2016-10-04 MED ORDER — VITAL HIGH PROTEIN PO LIQD: 1000.0000 mL | ORAL | Status: DC

## 2016-10-04 MED ORDER — LIDOCAINE HCL 1 % IJ SOLN
INTRAMUSCULAR | Status: AC
  Filled 2016-10-04: qty 20

## 2016-10-04 MED ORDER — PRO-STAT SUGAR FREE PO LIQD
60.0000 mL | Freq: Four times a day (QID) | ORAL | Status: DC
  Administered 2016-10-04 – 2016-10-05 (×3): 60 mL
  Filled 2016-10-04 (×3): qty 60

## 2016-10-04 MED ORDER — SODIUM CHLORIDE 0.9% FLUSH
10.0000 mL | Freq: Two times a day (BID) | INTRAVENOUS | Status: DC
Start: 2016-10-04 — End: 2016-10-12
  Administered 2016-10-05 (×2): 10 mL
  Administered 2016-10-06: 20 mL
  Administered 2016-10-07 – 2016-10-11 (×5): 10 mL

## 2016-10-04 MED ORDER — SODIUM CHLORIDE 0.9 % IV BOLUS (SEPSIS)
1000.0000 mL | Freq: Once | INTRAVENOUS | Status: AC
  Administered 2016-10-04: 1000 mL via INTRAVENOUS

## 2016-10-04 MED ORDER — ORAL CARE MOUTH RINSE
15.0000 mL | Freq: Four times a day (QID) | OROMUCOSAL | Status: DC
  Administered 2016-10-04 – 2016-10-05 (×4): 15 mL via OROMUCOSAL

## 2016-10-04 MED ORDER — ORAL CARE MOUTH RINSE
15.0000 mL | OROMUCOSAL | Status: DC
  Administered 2016-10-04 (×4): 15 mL via OROMUCOSAL

## 2016-10-04 MED ORDER — CHLORHEXIDINE GLUCONATE CLOTH 2 % EX PADS
6.0000 | MEDICATED_PAD | Freq: Every day | CUTANEOUS | Status: DC
  Administered 2016-10-05 – 2016-10-12 (×7): 6 via TOPICAL

## 2016-10-04 MED ORDER — CHLORHEXIDINE GLUCONATE 0.12% ORAL RINSE (MEDLINE KIT)
15.0000 mL | Freq: Two times a day (BID) | OROMUCOSAL | Status: DC
  Administered 2016-10-04 (×2): 15 mL via OROMUCOSAL

## 2016-10-04 SURGICAL SUPPLY — 2 items
EVACUATOR 1/8 PVC DRAIN (DRAIN) IMPLANT
PERIVAC PERICARDIOCENTESIS 8.3 (TRAY / TRAY PROCEDURE) ×2 IMPLANT

## 2016-10-04 NOTE — Progress Notes (Addendum)
Pt seen independently from the NP. Agree with the assessment and plan as recorded on NPs note  21 year old with history of Graves' disease on methimazole, cerebral atrophy admitted with worsening dyspnea temp to 103 found to have large left pleural effusion and pericardial effusion with early tamponade. Intubated yesterday for respiratory protection Poor urine output overnight  Blood pressure 96/63, pulse 93, temperature 97.2 F (36.2 C), temperature source Axillary, resp. rate 16, height 5\' 5"  (1.651 m), weight 258 lb 2.5 oz (117.1 kg), SpO2 98 %. Gen:      No acute distress HEENT:  EOMI, sclera anicteric, ETT in place Neck:     No masses; no thyromegaly Lungs:    Reduced breath sounds over right; normal respiratory effort CV:         Regular rate and rhythm; no murmurs Abd:      + bowel sounds; soft, non-tender; no palpable masses, no distension Ext:    No edema; adequate peripheral perfusion Skin:      Excoriations over legs. Rash on shins Neuro: sedated, unresponsive.  Labs significant for  Lactic acid 3.2 >> 0.9 Pct 2.5 BUN/creatinine-26/1.9 Troponin < 0.03 WBC 9.1, hemoglobin 8.0  CT chest 5/16> large left pleural effusion with left lung atelectasis/infiltrate, small right pleural effusion with right lung opacities. I reviewed all images personally  Assessment/plan: 21 year old with cerebral atrophy, Graves' disease admitted with acute respiratory failure and large pleural and pericardial effusion. Etiology could be infectious process, viral illness with pericarditis. Other considerations include drug-induced lupus from methimazole especially since she had been complaining of skin rash in the weeks prior to the admission.  Respiratory failure Left pleural effusion Continue abx Follow cultures Continue vent support. Plan for thoracentesis today. Continue current steroid dose Check autoimmune panel, pleural studies as detailed in NPs note.   Pericardial effusion with early  tamponade Discussed with Dr. Jacinto HalimGanji. Will plan on pericardiocentesis today. Follow EKG  AKI, anuric Hopeful for renal recovery after thora and pericardiocentesis Continue fluid resuscitation. Follow CVP Will need nephrology consult tomorrow if there is no improvement Check renal ultrasound  GI, No issues Start tube feeds after procedures.  Protonix  Graves disease Hold methimazole  Mom updated at bedside.  The patient is critically ill with multiple organ system failure and requires high complexity decision making for assessment and support, frequent evaluation and titration of therapies, advanced monitoring, review of radiographic studies and interpretation of complex data.   Critical Care Time devoted to patient care services, exclusive of separately billable procedures, described in this note is 45 minutes.   Chilton GreathousePraveen Gaynel Schaafsma MD [place] [facility] Pager [phone] If no answer or after 3pm call: [phone] 10/04/2016, 2:15 PM

## 2016-10-04 NOTE — Progress Notes (Signed)
  Echocardiogram 2D Echocardiogram has been performed.  Leta JunglingCooper, Allison Deshotels M 10/04/2016, 8:22 AM

## 2016-10-04 NOTE — Progress Notes (Signed)
*  PRELIMINARY RESULTS* Vascular Ultrasound Bilateral lower extremity venous duplex has been completed.  Preliminary findings: No evidence of deep vein thrombosis in the visualized veins of the lower extremities.   Negative for baker's cysts bilaterally.   Erika FischerCharlotte C Annalina Boyer 10/04/2016, 11:32 AM

## 2016-10-04 NOTE — Care Management Note (Signed)
Case Management Note Donn PieriniKristi Kingjames Coury RN, BSN Unit 2W-Case Manager-- 2H coverage [phone]  Patient Details  Name: Baxter FlatteryOmaure Lahmann MRN: [id number] Date of Birth: 1996-05-02  Subjective/Objective:    Pt admitted with sepsis, pericardial effusion- intubated-on vent                Action/Plan: PTA pt lived at home- [place] from [place] (mother lives here). CM to follow for d/c needs  Expected Discharge Date:                  Expected Discharge Plan:     In-House Referral:     Discharge planning Services  CM Consult  Post Acute Care Choice:    Choice offered to:     DME Arranged:    DME Agency:     HH Arranged:    HH Agency:     Status of Service:  In process, will continue to follow  If discussed at Long Length of Stay Meetings, dates discussed:    Additional Comments:  Darrold SpanWebster, Matti Killingsworth Hall, RN 10/04/2016, 10:14 AM

## 2016-10-04 NOTE — Progress Notes (Signed)
PULMONARY / CRITICAL CARE MEDICINE   Name: Erika Boyer MRN: [id number] DOB: November 09, 1995    ADMISSION DATE:  10/03/2016 CONSULTATION DATE:  10/03/16  REFERRING MD:  Dillon Bjork, MD  CHIEF COMPLAINT:  Respiratory distress  HISTORY OF PRESENT ILLNESS:   21 year old with history of Graves' disease on methimazole, cerebral atrophy. Brought to the ED by her mother with 3 days of worsening dyspnea. Found to have temperature of 103 and a chest x-ray with large left pleural effusion, likely from severe community acquired pneumonia or viral illness. Also complains of increasing leg swelling for the past 2 weeks with redness, 1 episode of vomiting today. This was thought to be allergic reaction she was given 0.2 mg epinephrine, Solu-Medrol, albuterol and Atrovent by EMS. She continues to have significant respiratory distress and was placed on BiPAP and PCCM called for consultation. Noted to have elevated lactic acid, temperature of 103 with elevated WBC count.  She has a history of Graves' disease and cerebral atrophy. She moved a few weeks ago from [place], [place] and is establishing care here. There are no records in the system review. As per her mother she had a viral illness at age of 34 which caused cerebral atrophy. She has mental retardation, periods of agitation secondary to this and she has been seen by multiple neurologists prior to her move to [place].   SUBJECTIVE: Overnight per RN, temp dropped to 95, Decreased UOP Beside Echo just completed, pending results   VITAL SIGNS: BP 112/82   Pulse (!) 104   Temp 97.5 F (36.4 C) (Oral)   Resp 19   Ht 5\' 5"  (1.651 m)   Wt 258 lb 2.5 oz (117.1 kg)   LMP  (LMP Unknown)   SpO2 100%   BMI 42.96 kg/m   HEMODYNAMICS:    VENTILATOR SETTINGS: Vent Mode: PRVC FiO2 (%):  [40 %-100 %] 40 % Set Rate:  [15 bmp-16 bmp] 16 bmp Vt Set:  [460 mL] 460 mL PEEP:  [5 cmH20] 5 cmH20 Plateau Pressure:  [23 cmH20-26 cmH20] 23 cmH20  INTAKE  / OUTPUT: I/O last 3 completed shifts: In: 2718.2 [I.V.:2158.2; NG/GT:210; IV Piggyback:350] Out: 61 [Urine:625; Emesis/NG output:50]  PHYSICAL EXAMINATION: General:  Young adult female sedate on MV, NAD HEENT: MM pink/moist, ETT, OGT, mild JVD Neuro:  Sedated, just received fentanyl, prior to that MAE, non focal CV: distant s1s2 rrr, no m/r/g PULM: even/non-labored on MV, lungs bilaterally clear on right, reduced BS on left  GI: obese, soft, non-tender, bsx4 active  Extremities: warm/dry, 2-3+ BLE edema  Skin: Multiple excoriations to BLE (from prior itching), skin moist to face/ extremities warm/dry    LABS:  BMET  Recent Labs Lab 10/03/16 1408  NA 137  K 3.3*  CL 107  CO2 19*  BUN 18  CREATININE 1.46*  GLUCOSE 118*    Electrolytes  Recent Labs Lab 10/03/16 1408 10/03/16 1928  CALCIUM 7.9*  --   MG  --  1.7  PHOS  --  3.5    CBC  Recent Labs Lab 10/03/16 1408  WBC 13.5*  HGB 9.5*  HCT 29.0*  PLT 241    Coag's  Recent Labs Lab 10/03/16 1928  APTT 37*  INR 1.68    Sepsis Markers  Recent Labs Lab 10/03/16 1800 10/03/16 1928 10/03/16 2301  LATICACIDVEN 2.59* 2.3* 3.2*  PROCALCITON  --  1.81  --     ABG  Recent Labs Lab 10/03/16 1446 10/03/16 2055 10/04/16 0636  PHART 7.444 7.368  7.416  PCO2ART 23.7* 33.4 28.5*  PO2ART 54.0* 181* 70.0*    Liver Enzymes  Recent Labs Lab 10/03/16 1408  AST 34  ALT 17  ALKPHOS 41  BILITOT 1.4*  ALBUMIN 2.5*    Cardiac Enzymes  Recent Labs Lab 10/03/16 1928 10/03/16 2301  TROPONINI 0.05* 0.03*    Glucose  Recent Labs Lab 10/03/16 2007 10/03/16 2347  GLUCAP 148* 175*    Imaging  STUDIES:  CXR 5/16 > significant cardio megaly with large left effusion CT chest 5/16 > large circumferential pericardial effusion, large left pleural effusion with compressive atelectasias and ? L>R shift of mediastinum, small right pleural effusion  CULTURES: Bcx 5/16 > Tracheal aspirate   5/16 >  Sputum GS 5/16 >  UC 5/16 >  RSV 5/16 >  MRSA 5/16 > neg Pleural fluid 5/17 >> Pericardial fluid 5/17 >>   ANTIBIOTICS: Ceftriaxone 5/16 > Azithro 5/16 >  SIGNIFICANT EVENTS: 5/16 > admit with resp failure, intubated 5/17 > worsening UOP, CVC, Left thora, pericardiocentesis   LINES/TUBES: ETT 5/16 >  OGT 5/16 >  Foley 5/16 >  L TL IJ 5/17 >    DISCUSSION: 21 year old with cerebral atrophy, Graves' disease admitted with acute respiratory failure, sepsis. Source appears to be left  community acquired pneumonia with an associated large effusion. Her heart also appears to be enlarged on the CXR so we need to assess for pericardial effusion, pericarditis in setting of viral illness.    ASSESSMENT / PLAN:  PULMONARY A: Acute resp failure Large left effusion - parapneumonic vs serositis  CAP P:   PRVC 8cc/kg Wean FiO2/ PEEP for sats > 94% Left thoracentesis ABG PRN Daily CXR while intubated Hold on SBT today for procedures  Continue ABX Place CVL and check CVP q4  Continue solumedrol q 12 Pleural fluid for studies - including PH fluid, culture, gram stain, cytology, LDH, adenosine deaminase, RF, ana, complement 3/4, cell count, cholesterol, total protein,  Checking serum PH, LDH, and cholesterol, CRP, anca titers, histone antibodies, anti-GBM, RF, ace level, scl-70, DS DNA, anti-phos lipid, lupus panel, anti- CCP  CARDIOVASCULAR A:  Tachycardia  Pericardial Effusion  Cardiomegaly on CXR  Mildly elevated troponin- likely demand, peaked at 0.05 5/16 Elevated LA - improving  P:  ICU monitoring Appreciate Cards input Planned to go to Cath lab this afternoon for pericardiocentesis  - Cards to send fluid for studies please Awaiting EKG Continue asa    RENAL A:   AKI- worsening renal function/ UOP P:   1 L ns bolus now  Trend CVP  Renal panel this afternoon  Check renal ultrasound Trend BMP / urinary output Replace electrolytes as indicated Avoid  nephrotoxic agents, ensure adequate renal perfusion   GASTROINTESTINAL A:   No issues P:   Start TF after procedures today Continue protonix for sup  HEMATOLOGIC A:   Leukocytosis, likely from sepsis vs inflammation Sed rate 70  P:  Trend CBC Checking anemia panel Checking autoimmune panel   INFECTIOUS A:   Sepsis CAP P:   Abx as above Follow cultures and RVP, flu test. Trend PCT Sending pleural and pericardial fluid for studies   ENDOCRINE A:   Graves disease TSH elevated to 7 P:   Follow TSH, free T4, T3 levels  Holding methimazole  NEUROLOGIC A:   Encephalopathy Cerebral atrophy P:   RASS goal: 0/ -1 Propofol  PRN fentanyl DUA when stable    FAMILY  - Updates: Mother updated at the bedside 5/17. -  Inter-disciplinary family meet or Palliative Care meeting due by:  5/23   CCT 45 mins  Kennieth Rad, AGACNP-BC  Pulmonary & Critical Care Pgr: [phone] or if no answer [phone] 10/04/2016, 1:18 PM

## 2016-10-04 NOTE — Progress Notes (Signed)
Simonds MD paged regarding patient's minimal urine output of 10-20 mL/hr over the last several hours. No new orders received at this time. Will continue to monitor.   Marlou PorchBradley Gehrig Patras

## 2016-10-04 NOTE — Progress Notes (Signed)
I have drained 870 mL of serosanguineous fluid, pericardial effusion completely resolved. I will started the patient on colchicine, suspect could be acute peritonitis/viral pericarditis.   Erika Boyer, Erika Murdy, MD 10/04/2016, 7:06 PM [facility] Pager: [phone] Office: [phone] If no answer: Cell:  [phone]

## 2016-10-04 NOTE — Progress Notes (Signed)
eLink Physician-Brief Progress Note Patient Name: Erika FlatteryOmaure Mcneel DOB: [birth year] MRN: [id number]   Date of Service  10/04/2016  HPI/Events of Note  CT chest reviewed. She has a large pericardial effusion, large L pleural effusion with compressive atelectasis and probably some L>R shift of mediastinum. She is hemodynamically stable and not require vasopressors  eICU Interventions  Discussed with Dr Jacinto HalimGanji who will see first thing in morning with stat echocardiogram and possible pericardiocentesis. She will also likely need pleural catheter or chest tube placement     Intervention Category Major Interventions: Other:  Merwyn KatosDavid B Simonds 10/04/2016, 12:43 AM

## 2016-10-04 NOTE — Progress Notes (Addendum)
Initial Nutrition Assessment  DOCUMENTATION CODES:   Morbid obesity  INTERVENTION:    Initiate Vital High Protein at goal rate of 10 ml/h (240 ml per day) and Prostat 60 ml QID    Liquid multivitamin (ADULT) daily   TF regimen + current Propofol infusion to provide 1695 kcals, 141 gm protein, 201 ml of free water  NUTRITION DIAGNOSIS:   Inadequate oral intake related to inability to eat as evidenced by NPO status  GOAL:   Provide needs based on ASPEN/SCCM guidelines  MONITOR:   Vent status, Labs, Weight trends, Skin, I & O's  REASON FOR ASSESSMENT:   Consult  Enteral/tube feeding initiation and management   ASSESSMENT:   21 y.o. Female with mental retardation of questionable etiology, related to viral illness at age 21, admitted to the hospital with 2 weeks history of big edema, 2-3 days of fever, worsening dyspnea.  Patient is currently intubated on ventilator support Temp (24hrs), Avg:97.2 F (36.2 C), Min:95 F (35 C), Max:98.4 F (36.9 C)  OGT in place Propofol: 24.4 ml/hr >> 644 fat kcals  Pt with Graves' disease; admitted with acute respiratory failure and sepsis. CXR showed pneumonia with associated large pericardial effusion.  Cardiology following.  2 D echocardiogram performed this AM. Medications reviewed.  Labs reviewed.  Sodium 134 (L). CBG's 148-175.  Nutrition focused physical exam completed.  No muscle or subcutaneous fat depletion noticed.  Diet Order:  Diet NPO time specified  Skin:  Reviewed, no issues  Last BM:  PTA  Height:   Ht Readings from Last 1 Encounters:  10/03/16 5\' 5"  (1.651 m)   Weight:   Wt Readings from Last 1 Encounters:  10/04/16 258 lb 2.5 oz (117.1 kg)   Ideal Body Weight:  56.8 kg  BMI:  Body mass index is 42.96 kg/m.  Estimated Nutritional Needs:   Kcal:  [id number]  Protein:  >/= 142 gm  Fluid:  per MD  EDUCATION NEEDS:   No education needs identified at this time  Maureen ChattersKatie Amaurie Wandel, RD,  LDN Pager #: [phone] After-Hours Pager #: [phone]

## 2016-10-04 NOTE — Progress Notes (Signed)
CT Chest results called to E-Link MD with resulting large left pleural effusion. MD to communicate with interdisciplinary team. Awaiting further orders and will continue to monitor.   Marlou PorchBradley Karlena Luebke

## 2016-10-04 NOTE — Procedures (Signed)
Thoracentesis Procedure Note  Pre-operative Diagnosis: left pleural effusion   Post-operative Diagnosis: same  Indications: evaluation & evacuation of pleural fluid   Procedure Details  Consent: Informed consent was obtained. Risks of the procedure were discussed including: infection, bleeding, pain, pneumothorax.  Under sterile conditions the patient was positioned. Betadine solution and sterile drapes were utilized.  1% buffered lidocaine was used to anesthetize the  rib space which was identified via real time US. Fluid was obtained without any difficulties and minimal blood loss.  A dressing was applied to the wound and wound care instructions were provided.   Findings 850 ml of cloudy pleural fluid was obtained. A sample was sent to Pathology for cytogenetics, flow, and cell counts, as well as for infection analysis.  Complications:  None; patient tolerated the procedure well.          Condition: stable  Plan A follow up chest x-ray was ordered.  Simonne MartinetPeter E Havilah Topor ACNP-BC [facility] Pulmonary/Critical Care Pager # [phone] OR # [phone] if no answer

## 2016-10-04 NOTE — H&P (View-Only) (Signed)
CARDIOLOGY CONSULT NOTE  Patient ID: Erika Boyer MRN: [id number] DOB/AGE: May 13, 1996 21 y.o.  Admit date: 10/03/2016 Referring Physician  Cheral Marker, MD Primary Physician:  [name], PA-C Reason for Consultation  Pericardial effusion  HPI: Erika Boyer  is a 21 y.o. female  With h/o Grave's disease, mental retardation of questionable etiology, related to viral illness at age 72,admitted to the hospital with 2 weeks history of big edema, 2-3 days of fever, worsening dyspnea. In the emergency room was found to have a temperature of 103F, chest x-ray showing a large left pleural effusion. Patient was in respiratory distress needing intubation in this patient is presently sedated on deployment.  Last night as CT scan of the chest was obtained which revealed large left pleural effusion with mediastinal shift to the right and also large pericardial effusion. I was asked to see the patient regarding management of pericardial effusion.  History is obtained per chart review, patient is sedated and intubated.  Patient had equal blood pressure in both arms, overnight blood pressures remained stable without need for inotropic support. She is presently being treated with antibiotics and ventilatory support.  Past Medical History:  Diagnosis Date  . Cerebellar degeneration      Past Surgical History:  Procedure Laterality Date  . BREAST REDUCTION SURGERY    . TONSILLECTOMY       No family history on file.   Social History: Social History   Social History  . Marital status: Single    Spouse name: N/A  . Number of children: N/A  . Years of education: N/A   Occupational History  . Not on file.   Social History Main Topics  . Smoking status: Never Smoker  . Smokeless tobacco: Never Used  . Alcohol use Not on file  . Drug use: Unknown  . Sexual activity: Not on file   Other Topics Concern  . Not on file   Social History Narrative  . No narrative on file      Prescriptions Prior to Admission  Medication Sig Dispense Refill Last Dose  . albuterol (PROAIR HFA) 108 (90 Base) MCG/ACT inhaler Inhale 2 puffs into the lungs every 4 (four) hours as needed for wheezing or shortness of breath.   10/03/2016 at am  . EPINEPHrine (EPIPEN 2-PAK) 0.3 mg/0.3 mL IJ SOAJ injection Inject 0.3 mg into the muscle once as needed (for anaphylaxis).    Past Month at Unknown time  . ibuprofen (ADVIL,MOTRIN) 600 MG tablet Take 600 mg by mouth every 6 (six) hours as needed (for pain).   10/03/2016 at am  . methimazole (TAPAZOLE) 10 MG tablet Take 1.5 tablets (15 mg total) by mouth daily. 45 tablet 2 10/02/2016 at am   Scheduled Meds: . aspirin  81 mg Per Tube Daily  . chlorhexidine gluconate (MEDLINE KIT)  15 mL Mouth Rinse BID  . enoxaparin (LOVENOX) injection  40 mg Subcutaneous Q24H  . fentaNYL (SUBLIMAZE) injection  50 mcg Intravenous Once  . mouth rinse  15 mL Mouth Rinse QID  . methylPREDNISolone (SOLU-MEDROL) injection  40 mg Intravenous Q12H  . midazolam  4 mg Intravenous Once  . pantoprazole (PROTONIX) IV  40 mg Intravenous QHS   Continuous Infusions: . sodium chloride 125 mL/hr at 10/04/16 0700  . sodium chloride    . azithromycin    . cefTRIAXone (ROCEPHIN)  IV 1 g (10/04/16 0500)  . propofol (DIPRIVAN) infusion 30 mcg/kg/min (10/04/16 0700)   PRN Meds:.sodium chloride, docusate, fentaNYL (SUBLIMAZE) injection, fentaNYL (  SUBLIMAZE) injection, ondansetron (ZOFRAN) IV   Review of Systems - Not able to be obtained    Physical Exam: Blood pressure 112/82, pulse (!) 104, temperature 97.5 F (36.4 C), temperature source Oral, resp. rate 19, height 5' 5"  (1.651 m), weight 117.1 kg (258 lb 2.5 oz), SpO2 96 %.   General appearance: cyanotic and Sedated and on ventilator Neck: no carotid bruit, supple, symmetrical, trachea midline, thyroid not enlarged, symmetric, no tenderness/mass/nodules and JVD present. Lungs: absent breath sounds left and no crackles or  added sounds heard.  Chest wall: No deformity Heart: regular rate and rhythm, S1, S2 normal, no murmur, click, rub or gallop and tachycardia present Abdomen: soft, non-tender; bowel sounds normal; no masses,  no organomegaly and obese Extremities: edema 2-3 plus and Homans sign is negative, no sign of DVT  Labs:  Lab Results  Component Value Date   WBC 13.5 (H) 10/03/2016   HGB 9.5 (L) 10/03/2016   HCT 29.0 (L) 10/03/2016   MCV 70.2 (L) 10/03/2016   PLT 241 10/03/2016    Recent Labs Lab 10/03/16 1408  NA 137  K 3.3*  CL 107  CO2 19*  BUN 18  CREATININE 1.46*  CALCIUM 7.9*  PROT 7.1  BILITOT 1.4*  ALKPHOS 41  ALT 17  AST 34  GLUCOSE 118*    Lipid Panel     Component Value Date/Time   TRIG 139 10/03/2016 1928    BNP (last 3 results)  Recent Labs  10/03/16 1928  BNP 114.1*    HEMOGLOBIN A1C No results found for: HGBA1C, MPG  Cardiac Panel (last 3 results)  Recent Labs  10/03/16 1928 10/03/16 2301  TROPONINI 0.05* 0.03*    Lab Results  Component Value Date   TROPONINI 0.03 (HH) 10/03/2016     TSH  Recent Labs  08/23/16 1548 10/03/16 1408  TSH 4.710* 7.093*     Radiology: Ct Chest Wo Contrast  Result Date: 10/03/2016 CLINICAL DATA:  Initial evaluation for pleural effusion. EXAM: CT CHEST WITHOUT CONTRAST TECHNIQUE: Multidetector CT imaging of the chest was performed following the standard protocol without IV contrast. COMPARISON:  Comparison made with prior radiograph from earlier same day. FINDINGS: Cardiovascular: Intrathoracic aorta up Ing great vessels not well evaluated on this noncontrast examination. Heart size likely within normal limits, but difficult to discern as well. There is a large circumferential pericardial effusion. Mediastinum/Nodes: Endotracheal tube in place. Tip is somewhat low lying at the carina, and projecting towards the right mainstem bronchus. Retraction by approximately 2 cm suggested. Enteric tube courses into the  stomach. Thyroid grossly normal. Evaluation for mediastinal and hilar adenopathy fairly limited on this exam. Few scattered mildly prominent axillary lymph nodes measure up to the upper limits of normal. Esophagus not well evaluated. Lungs/Pleura: There is a large left pleural effusion involving much of the left lung. Probable slight left-to-right shift of the mediastinal structures. Associated atelectasis and/or consolidation within the left lung, with only small amount of aeration present within the left upper lobe. Smaller layering right pleural effusion. Right basilar atelectasis and/ or consolidation present as well within the right lower lobe. Additional patchy infiltrate within the posterior right upper lobe as well. No obvious pulmonary edema. No pneumothorax. No appreciable pulmonary nodule or mass. Upper Abdomen: Partially visualized upper abdomen grossly unremarkable. Musculoskeletal: No acute osseous abnormality. No worrisome lytic or blastic osseous lesions. IMPRESSION: 1. Large left pleural effusion. Associated opacity throughout the left lung may reflect atelectasis and/or infiltrate. 2. Smaller right pleural effusion  with associated right upper and lower lobe opacity, which also may reflect atelectasis or infiltrates. 3. Large circumferential pericardial effusion. 4. Tip of the endotracheal tube somewhat low lying at the carina, and directed towards the right mainstem bronchus. Retraction by 2 cm is recommended. Critical Value/emergent results were called by telephone at the time of interpretation on 10/03/2016 at 10:42 pm to the critical care nurse taking care of the patient, Porfirio Oar, who verbally acknowledged these results. Electronically Signed   By: Jeannine Boga M.D.   On: 10/03/2016 22:45   Dg Chest Port 1 View  Result Date: 10/03/2016 CLINICAL DATA:  Acute respiratory failure.  Intubation. EXAM: PORTABLE CHEST 1 VIEW COMPARISON:  10/03/2016 chest radiograph FINDINGS: Enlargement of  the cardiopericardial silhouette identified. A large left pleural effusion and left lower lung atelectasis/consolidation again noted. An endotracheal tube is identified with tip 1.5 cm above the carina. An NG tube is noted entering the stomach. This is a low volume film. There is no evidence of pneumothorax. IMPRESSION: Endotracheal tube with tip 1.5 cm above the carina. Unchanged large left pleural effusion and left lower lung consolidation/atelectasis. Enlarged cardiopericardial silhouette. Electronically Signed   By: Margarette Canada M.D.   On: 10/03/2016 19:48   Dg Chest Port 1 View  Result Date: 10/03/2016 CLINICAL DATA:  Shortness of breath and fevers EXAM: PORTABLE CHEST 1 VIEW COMPARISON:  None. FINDINGS: Cardiac shadow is prominent. Large left-sided pleural effusion is noted. There is likely underlying infiltrate present. No bony abnormality is noted. The right lung is well aerated without focal infiltrate. IMPRESSION: Large left-sided pleural effusion with likely underlying infiltrate. Electronically Signed   By: Inez Catalina M.D.   On: 10/03/2016 14:04   Dg Abd Portable 1v  Result Date: 10/03/2016 CLINICAL DATA:  Orogastric tube placement EXAM: PORTABLE ABDOMEN - 1 VIEW COMPARISON:  Same day CXR FINDINGS: The bowel gas pattern is normal. The tip and side port of a gastric tube is seen along the expected location greater curvature stomach. No radio-opaque calculi or other significant radiographic abnormality are seen. Bowel gas pattern is unremarkable. Obscuration of the left hemidiaphragm from known left pleural effusion is partially visualized. No acute osseous appearing abnormality. IMPRESSION: Orogastric tube tip and side-port in the expected location the stomach. Left pleural effusion accounting for obscuration of left hemidiaphragm. Electronically Signed   By: Ashley Royalty M.D.   On: 10/03/2016 21:48    Scheduled Meds: . aspirin  81 mg Per Tube Daily  . chlorhexidine gluconate (MEDLINE KIT)   15 mL Mouth Rinse BID  . enoxaparin (LOVENOX) injection  40 mg Subcutaneous Q24H  . fentaNYL (SUBLIMAZE) injection  50 mcg Intravenous Once  . mouth rinse  15 mL Mouth Rinse QID  . methylPREDNISolone (SOLU-MEDROL) injection  40 mg Intravenous Q12H  . midazolam  4 mg Intravenous Once  . pantoprazole (PROTONIX) IV  40 mg Intravenous QHS   Continuous Infusions: . sodium chloride 125 mL/hr at 10/04/16 0700  . sodium chloride    . azithromycin    . cefTRIAXone (ROCEPHIN)  IV 1 g (10/04/16 0500)  . propofol (DIPRIVAN) infusion 30 mcg/kg/min (10/04/16 0700)   PRN Meds:.sodium chloride, docusate, fentaNYL (SUBLIMAZE) injection, fentaNYL (SUBLIMAZE) injection, ondansetron (ZOFRAN) IV  CARDIAC STUDIES:    EKG: NOt present on chart  Echo: Pending  ASSESSMENT AND PLAN:  1. Large pleural and pericardial effusion of undetermined etiology. Hemodynamically stable, does have elevated JVD and mild hepatomegaly on exam. 2. Probable sepsis 3. History of mental retardation  4. History of Graves' disease, presently on methimazole.  Recommendation: I will obtain an echocardiogram this morning, unless there is evidence of tamponade, large left pleural effusion can easily be tapped to improve ventilation, I'll obtain ANA.  Adrian Prows, MD 10/04/2016, 7:19 AM [facility] Pager: [phone] Office: [phone] If no answer Cell [phone]  EKG ST elevation in the inferior and lateral leads 0.5 mm and suggests acute pericarditis. Echo reviewed. Met mother and explained need for pericardiocentesis, early tamponade. Discussed potential for hemopericardium, need for surgical intervention and arrhythmias and potential death risk of <1%. She understands and is willing to proceed. D/W Dr. Vaughan Browner.

## 2016-10-04 NOTE — Consult Note (Addendum)
CARDIOLOGY CONSULT NOTE  Patient ID: Erika Boyer MRN: [id number] DOB/AGE: 09-27-1995 21 y.o.  Admit date: 10/03/2016 Referring Physician  Cheral Marker, MD Primary Physician:  [name], PA-C Reason for Consultation  Pericardial effusion  HPI: Erika Boyer  is a 21 y.o. 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 In the emergency room was found to have a temperature of 103F, chest x-ray showing a large left pleural effusion. Patient was in respiratory distress needing intubation in this patient is presently sedated on deployment.  Last night as CT scan of the chest was obtained which revealed large left pleural effusion with mediastinal shift to the right and also large pericardial effusion. I was asked to see the patient regarding management of pericardial effusion.  History is obtained per chart review, patient is sedated and intubated.  Patient had equal blood pressure in both arms, overnight blood pressures remained stable without need for inotropic support. She is presently being treated with antibiotics and ventilatory support.  Past Medical History:  Diagnosis Date  . Cerebellar degeneration      Past Surgical History:  Procedure Laterality Date  . BREAST REDUCTION SURGERY    . TONSILLECTOMY       No family history on file.   Social History: Social History   Social History  . Marital status: Single    Spouse name: N/A  . Number of children: N/A  . Years of education: N/A   Occupational History  . Not on file.   Social History Main Topics  . Smoking status: Never Smoker  . Smokeless tobacco: Never Used  . Alcohol use Not on file  . Drug use: Unknown  . Sexual activity: Not on file   Other Topics Concern  . Not on file   Social History Narrative  . No narrative on file      Prescriptions Prior to Admission  Medication Sig Dispense Refill Last Dose  . albuterol (PROAIR HFA) 108 (90 Base) MCG/ACT inhaler Inhale 2 puffs into the lungs every 4 (four) hours as needed for wheezing or shortness of breath.   10/03/2016 at am  . EPINEPHrine (EPIPEN 2-PAK) 0.3 mg/0.3 mL IJ SOAJ injection Inject 0.3 mg into the muscle once as needed (for anaphylaxis).    Past Month at Unknown time  . ibuprofen (ADVIL,MOTRIN) 600 MG tablet Take 600 mg by mouth every 6 (six) hours as needed (for pain).   10/03/2016 at am  . methimazole (TAPAZOLE) 10 MG tablet Take 1.5 tablets (15 mg total) by mouth daily. 45 tablet 2 10/02/2016 at am   Scheduled Meds: . aspirin  81 mg Per Tube Daily  . chlorhexidine gluconate (MEDLINE KIT)  15 mL Mouth Rinse BID  . enoxaparin (LOVENOX) injection  40 mg Subcutaneous Q24H  . fentaNYL (SUBLIMAZE) injection  50 mcg Intravenous Once  . mouth rinse  15 mL Mouth Rinse QID  . methylPREDNISolone (SOLU-MEDROL) injection  40 mg Intravenous Q12H  . midazolam  4 mg Intravenous Once  . pantoprazole (PROTONIX) IV  40 mg Intravenous QHS   Continuous Infusions: . sodium chloride 125 mL/hr at 10/04/16 0700  . sodium chloride    . azithromycin    . cefTRIAXone (ROCEPHIN)  IV 1 g (10/04/16 0500)  . propofol (DIPRIVAN) infusion 30 mcg/kg/min (10/04/16 0700)   PRN Meds:.sodium chloride, docusate, fentaNYL (SUBLIMAZE) injection, fentaNYL (  SUBLIMAZE) injection, ondansetron (ZOFRAN) IV   Review of Systems - Not able to be obtained    Physical Exam: Blood pressure 112/82, pulse (!) 104, temperature 97.5 F (36.4 C), temperature source Oral, resp. rate 19, height 5' 5"  (1.651 m), weight 117.1 kg (258 lb 2.5 oz), SpO2 96 %.   General appearance: cyanotic and Sedated and on ventilator Neck: no carotid bruit, supple, symmetrical, trachea midline, thyroid not enlarged, symmetric, no tenderness/mass/nodules and JVD present. Lungs: absent breath sounds left and no crackles or  added sounds heard.  Chest wall: No deformity Heart: regular rate and rhythm, S1, S2 normal, no murmur, click, rub or gallop and tachycardia present Abdomen: soft, non-tender; bowel sounds normal; no masses,  no organomegaly and obese Extremities: edema 2-3 plus and Homans sign is negative, no sign of DVT  Labs:  Lab Results  Component Value Date   WBC 13.5 (H) 10/03/2016   HGB 9.5 (L) 10/03/2016   HCT 29.0 (L) 10/03/2016   MCV 70.2 (L) 10/03/2016   PLT 241 10/03/2016    Recent Labs Lab 10/03/16 1408  NA 137  K 3.3*  CL 107  CO2 19*  BUN 18  CREATININE 1.46*  CALCIUM 7.9*  PROT 7.1  BILITOT 1.4*  ALKPHOS 41  ALT 17  AST 34  GLUCOSE 118*    Lipid Panel     Component Value Date/Time   TRIG 139 10/03/2016 1928    BNP (last 3 results)  Recent Labs  10/03/16 1928  BNP 114.1*    HEMOGLOBIN A1C No results found for: HGBA1C, MPG  Cardiac Panel (last 3 results)  Recent Labs  10/03/16 1928 10/03/16 2301  TROPONINI 0.05* 0.03*    Lab Results  Component Value Date   TROPONINI 0.03 (HH) 10/03/2016     TSH  Recent Labs  08/23/16 1548 10/03/16 1408  TSH 4.710* 7.093*     Radiology: Ct Chest Wo Contrast  Result Date: 10/03/2016 CLINICAL DATA:  Initial evaluation for pleural effusion. EXAM: CT CHEST WITHOUT CONTRAST TECHNIQUE: Multidetector CT imaging of the chest was performed following the standard protocol without IV contrast. COMPARISON:  Comparison made with prior radiograph from earlier same day. FINDINGS: Cardiovascular: Intrathoracic aorta up Ing great vessels not well evaluated on this noncontrast examination. Heart size likely within normal limits, but difficult to discern as well. There is a large circumferential pericardial effusion. Mediastinum/Nodes: Endotracheal tube in place. Tip is somewhat low lying at the carina, and projecting towards the right mainstem bronchus. Retraction by approximately 2 cm suggested. Enteric tube courses into the  stomach. Thyroid grossly normal. Evaluation for mediastinal and hilar adenopathy fairly limited on this exam. Few scattered mildly prominent axillary lymph nodes measure up to the upper limits of normal. Esophagus not well evaluated. Lungs/Pleura: There is a large left pleural effusion involving much of the left lung. Probable slight left-to-right shift of the mediastinal structures. Associated atelectasis and/or consolidation within the left lung, with only small amount of aeration present within the left upper lobe. Smaller layering right pleural effusion. Right basilar atelectasis and/ or consolidation present as well within the right lower lobe. Additional patchy infiltrate within the posterior right upper lobe as well. No obvious pulmonary edema. No pneumothorax. No appreciable pulmonary nodule or mass. Upper Abdomen: Partially visualized upper abdomen grossly unremarkable. Musculoskeletal: No acute osseous abnormality. No worrisome lytic or blastic osseous lesions. IMPRESSION: 1. Large left pleural effusion. Associated opacity throughout the left lung may reflect atelectasis and/or infiltrate. 2. Smaller right pleural effusion  with associated right upper and lower lobe opacity, which also may reflect atelectasis or infiltrates. 3. Large circumferential pericardial effusion. 4. Tip of the endotracheal tube somewhat low lying at the carina, and directed towards the right mainstem bronchus. Retraction by 2 cm is recommended. Critical Value/emergent results were called by telephone at the time of interpretation on 10/03/2016 at 10:42 pm to the critical care nurse taking care of the patient, Porfirio Oar, who verbally acknowledged these results. Electronically Signed   By: Jeannine Boga M.D.   On: 10/03/2016 22:45   Dg Chest Port 1 View  Result Date: 10/03/2016 CLINICAL DATA:  Acute respiratory failure.  Intubation. EXAM: PORTABLE CHEST 1 VIEW COMPARISON:  10/03/2016 chest radiograph FINDINGS: Enlargement of  the cardiopericardial silhouette identified. A large left pleural effusion and left lower lung atelectasis/consolidation again noted. An endotracheal tube is identified with tip 1.5 cm above the carina. An NG tube is noted entering the stomach. This is a low volume film. There is no evidence of pneumothorax. IMPRESSION: Endotracheal tube with tip 1.5 cm above the carina. Unchanged large left pleural effusion and left lower lung consolidation/atelectasis. Enlarged cardiopericardial silhouette. Electronically Signed   By: Margarette Canada M.D.   On: 10/03/2016 19:48   Dg Chest Port 1 View  Result Date: 10/03/2016 CLINICAL DATA:  Shortness of breath and fevers EXAM: PORTABLE CHEST 1 VIEW COMPARISON:  None. FINDINGS: Cardiac shadow is prominent. Large left-sided pleural effusion is noted. There is likely underlying infiltrate present. No bony abnormality is noted. The right lung is well aerated without focal infiltrate. IMPRESSION: Large left-sided pleural effusion with likely underlying infiltrate. Electronically Signed   By: Inez Catalina M.D.   On: 10/03/2016 14:04   Dg Abd Portable 1v  Result Date: 10/03/2016 CLINICAL DATA:  Orogastric tube placement EXAM: PORTABLE ABDOMEN - 1 VIEW COMPARISON:  Same day CXR FINDINGS: The bowel gas pattern is normal. The tip and side port of a gastric tube is seen along the expected location greater curvature stomach. No radio-opaque calculi or other significant radiographic abnormality are seen. Bowel gas pattern is unremarkable. Obscuration of the left hemidiaphragm from known left pleural effusion is partially visualized. No acute osseous appearing abnormality. IMPRESSION: Orogastric tube tip and side-port in the expected location the stomach. Left pleural effusion accounting for obscuration of left hemidiaphragm. Electronically Signed   By: Ashley Royalty M.D.   On: 10/03/2016 21:48    Scheduled Meds: . aspirin  81 mg Per Tube Daily  . chlorhexidine gluconate (MEDLINE KIT)   15 mL Mouth Rinse BID  . enoxaparin (LOVENOX) injection  40 mg Subcutaneous Q24H  . fentaNYL (SUBLIMAZE) injection  50 mcg Intravenous Once  . mouth rinse  15 mL Mouth Rinse QID  . methylPREDNISolone (SOLU-MEDROL) injection  40 mg Intravenous Q12H  . midazolam  4 mg Intravenous Once  . pantoprazole (PROTONIX) IV  40 mg Intravenous QHS   Continuous Infusions: . sodium chloride 125 mL/hr at 10/04/16 0700  . sodium chloride    . azithromycin    . cefTRIAXone (ROCEPHIN)  IV 1 g (10/04/16 0500)  . propofol (DIPRIVAN) infusion 30 mcg/kg/min (10/04/16 0700)   PRN Meds:.sodium chloride, docusate, fentaNYL (SUBLIMAZE) injection, fentaNYL (SUBLIMAZE) injection, ondansetron (ZOFRAN) IV  CARDIAC STUDIES:    EKG: NOt present on chart  Echo: Pending  ASSESSMENT AND PLAN:  1. Large pleural and pericardial effusion of undetermined etiology. Hemodynamically stable, does have elevated JVD and mild hepatomegaly on exam. 2. Probable sepsis 3. History of mental retardation  4. History of Graves' disease, presently on methimazole.  Recommendation: I will obtain an echocardiogram this morning, unless there is evidence of tamponade, large left pleural effusion can easily be tapped to improve ventilation, I'll obtain ANA.  Adrian Prows, MD 10/04/2016, 7:19 AM [facility] Pager: [phone] Office: [phone] If no answer Cell [phone]  EKG ST elevation in the inferior and lateral leads 0.5 mm and suggests acute pericarditis. Echo reviewed. Met mother and explained need for pericardiocentesis, early tamponade. Discussed potential for hemopericardium, need for surgical intervention and arrhythmias and potential death risk of <1%. She understands and is willing to proceed. D/W Dr. Vaughan Browner.

## 2016-10-04 NOTE — Progress Notes (Signed)
[name] paged regarding confusion surrounding the initiation of tube feeds tonight. MD instructions to initiate tube feeds tomorrow per the order.  Erika PorchBradley Tavarion Boyer

## 2016-10-04 NOTE — Interval H&P Note (Signed)
History and Physical Interval Note:  10/04/2016 6:00 PM  Erika Boyer  has presented today for surgery, with the diagnosis of pericardial effusion  The various methods of treatment have been discussed with the patient and family. After consideration of risks, benefits and other options for treatment, the patient has consented to  Procedure(s): Pericardiocentesis (N/A) as a surgical intervention .  The patient's history has been reviewed, patient examined, no change in status, stable for surgery.  I have reviewed the patient's chart and labs.  Questions were answered to the patient's satisfaction.     Yates DecampGANJI, Aarin Sparkman

## 2016-10-04 NOTE — Progress Notes (Signed)
CRITICAL VALUE ALERT  Critical value received:  Lactic Acid 3.2  Date of notification:  10/03/2016   Time of notification:  2345  Critical value read back: Yes  Nurse who received alert:  Marlou PorchBradley Klynn Linnemann RN  MD notified (1st page): E-Link MD  Time of first page:  0020  MD notified (2nd page):  Time of second page:  Responding MD:  E-Link MD  Time MD responded:  0020   No new orders received.  Marlou PorchBradley Rhya Shan

## 2016-10-04 NOTE — Progress Notes (Signed)
  Echocardiogram 2D Echocardiogram has been performed.  Delcie RochENNINGTON, Tyliah Schlereth 10/04/2016, 6:58 PM

## 2016-10-04 NOTE — Procedures (Signed)
Central Venous Catheter Insertion Procedure Note Erika FlatteryOmaure Boyer [id number] 1996/01/27  Procedure: Insertion of Central Venous Catheter Indications: Assessment of intravascular volume, Drug and/or fluid administration and Frequent blood sampling  Procedure Details Consent: Risks of procedure as well as the alternatives and risks of each were explained to the (patient/caregiver).  Consent for procedure obtained. Time Out: Verified patient identification, verified procedure, site/side was marked, verified correct patient position, special equipment/implants available, medications/allergies/relevent history reviewed, required imaging and test results available.  Performed  Maximum sterile technique was used including antiseptics, cap, gloves, gown, hand hygiene, mask and sheet. Skin prep: Chlorhexidine; local anesthetic administered A antimicrobial bonded/coated triple lumen catheter was placed in the left internal jugular vein using the Seldinger technique at 20 cm.  Biopatch used.    Evaluation Blood flow good Complications: No apparent complications Patient did tolerate procedure well. Chest X-ray ordered to verify placement.  CXR: pending.  Procedure performed under direct supervision of Dr. Isaiah SergeMannam and with ultrasound guidance for real time vessel cannulation.      Erika BoyerBrooke Sugey Boyer, AGACNP-BC [facility] Pgr: [phone] or if no answer [phone] 10/04/2016, 1:10 PM

## 2016-10-05 ENCOUNTER — Inpatient Hospital Stay (HOSPITAL_COMMUNITY): Payer: Medicare Other

## 2016-10-05 ENCOUNTER — Encounter (HOSPITAL_COMMUNITY): Payer: Self-pay | Admitting: Cardiology

## 2016-10-05 LAB — LACTIC ACID, PLASMA
LACTIC ACID, VENOUS: 0.8 mmol/L (ref 0.5–1.9)
Lactic Acid, Venous: 0.9 mmol/L (ref 0.5–1.9)

## 2016-10-05 LAB — ECHOCARDIOGRAM LIMITED
Height: 65 in
WEIGHTICAEL: 4130.54 [oz_av]

## 2016-10-05 LAB — GLUCOSE, CAPILLARY
GLUCOSE-CAPILLARY: 121 mg/dL — AB (ref 65–99)
GLUCOSE-CAPILLARY: 124 mg/dL — AB (ref 65–99)
GLUCOSE-CAPILLARY: 75 mg/dL (ref 65–99)

## 2016-10-05 LAB — PROCALCITONIN: PROCALCITONIN: 1.81 ng/mL

## 2016-10-05 LAB — T3, FREE: T3 FREE: 1.4 pg/mL — AB (ref 2.0–4.4)

## 2016-10-05 LAB — PH, BODY FLUID: pH, Body Fluid: 7.8

## 2016-10-05 LAB — COMPREHENSIVE METABOLIC PANEL
ALT: 15 U/L (ref 14–54)
ANION GAP: 6 (ref 5–15)
AST: 14 U/L — ABNORMAL LOW (ref 15–41)
Albumin: 1.9 g/dL — ABNORMAL LOW (ref 3.5–5.0)
Alkaline Phosphatase: 34 U/L — ABNORMAL LOW (ref 38–126)
BILIRUBIN TOTAL: 0.3 mg/dL (ref 0.3–1.2)
BUN: 29 mg/dL — ABNORMAL HIGH (ref 6–20)
CO2: 19 mmol/L — ABNORMAL LOW (ref 22–32)
Calcium: 7.5 mg/dL — ABNORMAL LOW (ref 8.9–10.3)
Chloride: 112 mmol/L — ABNORMAL HIGH (ref 101–111)
Creatinine, Ser: 1.75 mg/dL — ABNORMAL HIGH (ref 0.44–1.00)
GFR calc Af Amer: 47 mL/min — ABNORMAL LOW (ref >60)
GFR calc non Af Amer: 41 mL/min — ABNORMAL LOW (ref >60)
Glucose, Bld: 154 mg/dL — ABNORMAL HIGH (ref 65–99)
POTASSIUM: 4 mmol/L (ref 3.5–5.1)
Sodium: 137 mmol/L (ref 135–145)
TOTAL PROTEIN: 5.7 g/dL — AB (ref 6.5–8.1)

## 2016-10-05 LAB — MISC LABCORP TEST (SEND OUT)
LABCORP TEST CODE: 100156
LABCORP TEST CODE: 11254
Labcorp test code: 19497

## 2016-10-05 LAB — MAGNESIUM
MAGNESIUM: 2.1 mg/dL (ref 1.7–2.4)
MAGNESIUM: 2.2 mg/dL (ref 1.7–2.4)

## 2016-10-05 LAB — CBC
HEMATOCRIT: 22.2 % — AB (ref 36.0–46.0)
HEMOGLOBIN: 7.1 g/dL — AB (ref 12.0–15.0)
MCH: 22.1 pg — ABNORMAL LOW (ref 26.0–34.0)
MCHC: 32 g/dL (ref 30.0–36.0)
MCV: 69.2 fL — ABNORMAL LOW (ref 78.0–100.0)
Platelets: 225 10*3/uL (ref 150–400)
RBC: 3.21 MIL/uL — AB (ref 3.87–5.11)
RDW: 15.7 % — AB (ref 11.5–15.5)
WBC: 10.5 10*3/uL (ref 4.0–10.5)

## 2016-10-05 LAB — URINE CULTURE: Culture: NO GROWTH

## 2016-10-05 LAB — COMPLEMENT, TOTAL: Compl, Total (CH50): 17 U/mL — ABNORMAL LOW (ref 42–60)

## 2016-10-05 LAB — GLOMERULAR BASEMENT MEMBRANE ANTIBODIES: GBM Ab: 8 units (ref 0–20)

## 2016-10-05 LAB — ANTINUCLEAR ANTIBODIES, IFA
ANA Ab, IFA: POSITIVE — AB
ANTINUCLEAR ANTIBODIES, IFA: POSITIVE — AB

## 2016-10-05 LAB — CYCLIC CITRUL PEPTIDE ANTIBODY, IGG/IGA: CCP Antibodies IgG/IgA: 11 units (ref 0–19)

## 2016-10-05 LAB — FANA STAINING PATTERNS
Homogeneous Pattern: 1:1280 {titer}
Homogeneous Pattern: 1:1280 {titer}
Speckled Pattern: 1:160 {titer} — ABNORMAL HIGH

## 2016-10-05 LAB — ANTI-SCLERODERMA ANTIBODY: Scleroderma (Scl-70) (ENA) Antibody, IgG: <0.2 AI (ref 0.0–0.9)

## 2016-10-05 LAB — ANTI-SMITH ANTIBODY: ENA SM Ab Ser-aCnc: >8 AI — ABNORMAL HIGH (ref 0.0–0.9)

## 2016-10-05 LAB — PHOSPHORUS
PHOSPHORUS: 4.1 mg/dL (ref 2.5–4.6)
PHOSPHORUS: 2.7 mg/dL (ref 2.5–4.6)

## 2016-10-05 LAB — ANTI-DNA ANTIBODY, DOUBLE-STRANDED

## 2016-10-05 LAB — PROTEIN, BODY FLUID (OTHER): Total Protein, Body Fluid Other: 5.6 g/dL

## 2016-10-05 MED ORDER — ORAL CARE MOUTH RINSE: 15.0000 mL | Freq: Two times a day (BID) | OROMUCOSAL | Status: DC

## 2016-10-05 MED ORDER — FENTANYL CITRATE (PF) 100 MCG/2ML IJ SOLN
25.0000 ug | INTRAMUSCULAR | Status: DC | PRN
  Administered 2016-10-05: 25 ug via INTRAVENOUS
  Administered 2016-10-06: 50 ug via INTRAVENOUS
  Filled 2016-10-05 (×2): qty 2

## 2016-10-05 NOTE — Progress Notes (Signed)
Subjective:  Patient remains intubated.  Objective:  Vital Signs in the last 24 hours: Temp:  [95.2 F (35.1 C)-97.5 F (36.4 C)] 97.3 F (36.3 C) (05/18 0732) Pulse Rate:  [0-102] 89 (05/18 0730) Resp:  [0-28] 21 (05/18 0730) BP: (94-123)/(45-88) 111/82 (05/18 0730) SpO2:  [0 %-100 %] 98 % (05/18 0730) FiO2 (%):  [40 %] 40 % (05/18 0400) Weight:  [119 kg (262 lb 5.6 oz)] 119 kg (262 lb 5.6 oz) (05/18 0245)  Intake/Output from previous day: 05/17 0701 - 05/18 0700 In: 4108.3 [I.V.:3508.3; NG/GT:300; IV Piggyback:300] Out: 1115 [Urine:715; Emesis/NG output:400]  Physical Exam:  Blood pressure 111/82, pulse 89, temperature 97.3 F (36.3 C), temperature source Oral, resp. rate (!) 21, height 5\' 5"  (1.651 m), weight 119 kg (262 lb 5.6 oz), SpO2 98 %. Body mass index is 43.66 kg/m.   General appearance: acyanotic and Sedated and on ventilator Neck: no carotid bruit, supple, symmetrical, trachea midline, thyroid not enlarged, symmetric, no JVD (difficult to make out due to ET tube. CVP 10.  Lungs: absent breath sounds left and no crackles or added sounds heard.  Chest wall: No deformity, pericardiocentesis site appears normal. Heart: regular rate and rhythm, S1, S2 normal, no murmur, click, rub or gallop and tachycardia present Abdomen: soft, non-tender; bowel sounds normal; no masses,  no organomegaly and obese Extremities: edema 2-3 plus  Lab Results: BMP  Recent Labs  10/03/16 1408 10/04/16 0700 10/04/16 1944  NA 137 134* 135  K 3.3* 4.2 4.1  CL 107 105 109  CO2 19* 17* 18*  GLUCOSE 118* 133* 125*  BUN 18 26* 30*  CREATININE 1.46* 1.90* 2.10*  CALCIUM 7.9* 7.6* 7.6*  GFRNONAA 51* 37* 33*  GFRAA 59* 43* 38*    CBC  Recent Labs Lab 10/03/16 1408  10/04/16 0700  WBC 13.5*  --  9.1  RBC 4.13  < > 3.61*  HGB 9.5*  --  8.0*  HCT 29.0*  --  25.2*  PLT 241  --  189  MCV 70.2*  --  69.8*  MCH 23.0*  --  22.2*  MCHC 32.8  --  31.7  RDW 15.8*  --  15.7*   LYMPHSABS 1.9  --   --   MONOABS 0.4  --   --   EOSABS 0.1  --   --   BASOSABS 0.1  --   --   < > = values in this interval not displayed.  HEMOGLOBIN A1C No results found for: HGBA1C, MPG  Cardiac Panel (last 3 results)  Recent Labs  10/03/16 1928 10/03/16 2301 10/04/16 0700  TROPONINI 0.05* 0.03* <0.03   TSH  Recent Labs  08/23/16 1548 10/03/16 1408  TSH 4.710* 7.093*    Lipid Panel     Component Value Date/Time   CHOL 82 10/04/2016 1430   TRIG 139 10/03/2016 1928   Hepatic Function Panel  Recent Labs  08/23/16 1548 10/03/16 1408 10/04/16 1427 10/04/16 1944  PROT 7.7 7.1 5.8*  --   ALBUMIN 3.5 2.5*  --  2.2*  AST 14 34  --   --   ALT 11 17  --   --   ALKPHOS 56 41  --   --   BILITOT 0.2 1.4*  --   --     Imaging: Imaging results have been reviewed  Cardiac Studies:  EKG 10/04/2016: ST elevation in the inferior and lateral leads 0.5 mm and suggests acute pericarditis  ECHO: 10/04/2016: Left ventricle: The cavity  size is small and underfilled.   Systolic function was normal. The estimated ejection fraction was   in the range of 60% to 65%. Wall motion was normal; there were no   regional wall motion abnormalities. Left ventricular diastolic   function parameters were normal. - Aortic valve: Trileaflet; normal thickness leaflets. There was no   regurgitation. - Right ventricle: The cavity size was normal. Wall thickness was   normal. Systolic function was normal. - Tricuspid valve: There was mild regurgitation. - Pulmonary arteries: Systolic pressure was mildly increased. PA   peak pressure: 33 mm Hg (S). - Pericardium, extracardiac: There was a large left pleural   effusion.  Impressions:  - There is severe circumferential pericardial effusion with up to 3   cm around the lateral LV wall and up to 4 cm around the right   atrium.   There is right atrial invagination and imcomplete RV filling   during diastole.  Assessment/Plan:  1.  Pleural pericarditis with massive left pleural effusion and pericardial effusion, aspiration of 800 mL pleural fluid and 870 mL of pericardial fluid on 10/04/2016. 2. Acute renal failure 3. Anasarca and leg edema probably a combination of pleural and pericardial effusion leading to decreased right-sided output and increased CVP. Morbid obesity contributing also. 4. Morbid obesity 5. History of Graves' disease on methimazole  Recommendation: I have empirically started the patient on colchicine for pericardial effusion which should be continued on for 3 months. I would also recommend repeating an echocardiogram 1-2 days prior to discharge. I'll sign off for now, will be happy to assist if cardiac issues were to arise.   Yates Decamp, M.D. 10/05/2016, 8:17 AM [facility] Pager: [phone] Office: [phone] If no answer: [phone]

## 2016-10-05 NOTE — Progress Notes (Signed)
Deterding MD paged regarding patient's pain and the need for PRN pain medication. Orders received.  Erika PorchBradley Joyceann Boyer

## 2016-10-05 NOTE — Progress Notes (Signed)
PULMONARY / CRITICAL CARE MEDICINE   Name: Erika Boyer MRN: [id number] DOB: Jul 19, 1995    ADMISSION DATE:  10/03/2016 CONSULTATION DATE:  10/03/16  REFERRING MD:  Teresa CoombsMerrell D, MD  CHIEF COMPLAINT:  Respiratory distress  HISTORY OF PRESENT ILLNESS:   21 year old with history of Graves' disease on methimazole, cerebral atrophy. Brought to the ED by her mother with 3 days of worsening dyspnea. Found to have temperature of 103 and a chest x-ray with large left pleural effusion, likely from severe community acquired pneumonia or viral illness. Also complains of increasing leg swelling for the past 2 weeks with redness, 1 episode of vomiting today. This was thought to be allergic reaction she was given 0.2 mg epinephrine, Solu-Medrol, albuterol and Atrovent by EMS. She continues to have significant respiratory distress and was placed on BiPAP and PCCM called for consultation. Noted to have elevated lactic acid, temperature of 103 with elevated WBC count.  She has a history of Graves' disease and cerebral atrophy. She moved a few weeks ago from [place] and is establishing care here. There are no records in the system review. As per her mother she had a viral illness at age of 21 which caused cerebral atrophy. She has mental retardation, periods of agitation secondary to this and she has been seen by multiple neurologists prior to her move to [place].   SUBJECTIVE: Underwent thoracentesis and pericardiocentesis yesterday. Central venous line placed Urine output is picking up. Remains on the vent  VITAL SIGNS: BP 105/78 (BP Location: Left Arm)   Pulse 79   Temp 97.3 F (36.3 C) (Oral)   Resp 16   Ht 5\' 5"  (1.651 m)   Wt 262 lb 5.6 oz (119 kg)   LMP  (LMP Unknown)   SpO2 98%   BMI 43.66 kg/m   HEMODYNAMICS: CVP:  [11 mmHg-12 mmHg] 12 mmHg  VENTILATOR SETTINGS: Vent Mode: PRVC FiO2 (%):  [40 %] 40 % Set Rate:  [16 bmp] 16 bmp Vt Set:  [460 mL] 460 mL PEEP:  [5 cmH20]  5 cmH20 Plateau Pressure:  [18 cmH20-25 cmH20] 21 cmH20  INTAKE / OUTPUT: I/O last 3 completed shifts: In: 6526.5 [I.V.:5666.5; NG/GT:510; IV Piggyback:350] Out: 1790 [Urine:1340; Emesis/NG output:450]  PHYSICAL EXAMINATION: Blood pressure 105/78, pulse 79, temperature 97.3 F (36.3 C), temperature source Oral, resp. rate 16, height 5\' 5"  (1.651 m), weight 262 lb 5.6 oz (119 kg), SpO2 98 %. Gen:      No acute distress HEENT:  EOMI, sclera anicteric, ETT in place Neck:     No masses; no thyromegaly Lungs:    Clear to auscultation bilaterally; normal respiratory effort CV:         Regular rate and rhythm; no murmurs Abd:      + bowel sounds; soft, non-tender; no palpable masses, no distension Ext:    1-2 + edema; adequate peripheral perfusion Skin:      Excoriations over the B/L thighs.  LABS:  BMET  Recent Labs Lab 10/03/16 1408 10/04/16 0700 10/04/16 1944  NA 137 134* 135  K 3.3* 4.2 4.1  CL 107 105 109  CO2 19* 17* 18*  BUN 18 26* 30*  CREATININE 1.46* 1.90* 2.10*  GLUCOSE 118* 133* 125*    Electrolytes  Recent Labs Lab 10/03/16 1408  10/04/16 0700 10/04/16 1427 10/04/16 1944 10/05/16 0329  CALCIUM 7.9*  --  7.6*  --  7.6*  --   MG  --   < >  --  2.0 2.0  2.1  PHOS  --   < >  --  4.2 3.9  3.9 4.1  < > = values in this interval not displayed.  CBC  Recent Labs Lab 10/03/16 1408 10/04/16 0700  WBC 13.5* 9.1  HGB 9.5* 8.0*  HCT 29.0* 25.2*  PLT 241 189    Coag's  Recent Labs Lab 10/03/16 1928  APTT 37*  INR 1.68    Sepsis Markers  Recent Labs Lab 10/03/16 1928 10/03/16 2301 10/04/16 0700 10/04/16 0838 10/05/16 0329  LATICACIDVEN 2.3* 3.2*  --  0.9  --   PROCALCITON 1.81  --  2.50  --  1.81    ABG  Recent Labs Lab 10/03/16 1446 10/03/16 2055 10/04/16 0636  PHART 7.444 7.368 7.416  PCO2ART 23.7* 33.4 28.5*  PO2ART 54.0* 181* 70.0*    Liver Enzymes  Recent Labs Lab 10/03/16 1408 10/04/16 1944  AST 34  --   ALT 17  --    ALKPHOS 41  --   BILITOT 1.4*  --   ALBUMIN 2.5* 2.2*    Cardiac Enzymes  Recent Labs Lab 10/03/16 1928 10/03/16 2301 10/04/16 0700  TROPONINI 0.05* 0.03* <0.03    Glucose  Recent Labs Lab 10/03/16 2007 10/03/16 2347 10/04/16 1548 10/05/16 0731  GLUCAP 148* 175* 105* 121*    Imaging  STUDIES:  CXR 5/16 > significant cardio megaly with large left effusion CT chest 5/16 > large circumferential pericardial effusion, large left pleural effusion with compressive atelectasias and ? L>R shift of mediastinum, small right pleural effusion  CULTURES: Bcx 5/16 > Tracheal aspirate  5/16 >  Sputum GS 5/16 >  UC 5/16 >  RSV 5/16 >  MRSA 5/16 > neg Pleural fluid 5/17 >> Pericardial fluid 5/17 >>   ANTIBIOTICS: Ceftriaxone 5/16 > Azithro 5/16 >  SIGNIFICANT EVENTS: 5/16 > admit with resp failure, intubated 5/17 > worsening UOP, CVC, Left thora, pericardiocentesis   LINES/TUBES: ETT 5/16 >  OGT 5/16 >  Foley 5/16 >  L TL IJ 5/17 >    DISCUSSION: 21 year old with cerebral atrophy, Graves' disease admitted with acute respiratory failure, sepsis. Noted to have a large lt pleural effusion and large pericardial effusion with early tamponade S/p drainage of pleural and pericardial fluid.  Differential diagnosis includes CAP with effusion, drug-induced lupus with serositis, viral infection with pericarditis  ASSESSMENT / PLAN:  PULMONARY A: Acute resp failure Large left effusion, exudative - parapneumonic vs serositis  CAP P:   Continue vent support Start wake up and SBT assessments Follow CXR Continue solumedrol at current dose. Pleural fluid for studies - including PH fluid, culture, gram stain, cytology, LDH, adenosine deaminase, RF, ana, complement 3/4, cell count, cholesterol, total protein,  Checking serum PH, LDH, and cholesterol, CRP, anca titers, histone antibodies, anti-GBM, RF, ace level, scl-70, DS DNA, anti-phos lipid, lupus panel, anti-  CCP  CARDIOVASCULAR A:  Pericarditis with Pericardial Effusion s/p pericardiocentesis Mildly elevated troponin- likely demand, peaked at 0.05 5/16 Elevated LA - improving  P:  ICU monitoring Appreciate Cards input Started on colchicine   RENAL A:   AKI. Urine output better today AM Renal US is unremarkable P:   Avoid nephrotoxins Follow urine output and Cr Consider nephrology consult if no improvement  GASTROINTESTINAL A:   No issues P:   Start tube feeds Protonix for SUP  HEMATOLOGIC A:   Leukocytosis, likely from sepsis vs inflammation Sed rate 70  P:  Trend CBC Follow anemia panel  INFECTIOUS A:   Sepsis  CAP P:   Abx as above Follow cultures Trend PCT  ENDOCRINE A:   Graves disease TSH elevated to 7 P:   Follow TSH, free T4, T3 levels  Holding methimazole  NEUROLOGIC A:   Encephalopathy Cerebral atrophy P:   RASS goal: 0/ -1 Propofol. Wean sedation for wake up assessments PRN fentanyl  FAMILY  - Updates: Mother updated at the bedside 5/17. - Inter-disciplinary family meet or Palliative Care meeting due by:  5/23  The patient is critically ill with multiple organ system failure and requires high complexity decision making for assessment and support, frequent evaluation and titration of therapies, advanced monitoring, review of radiographic studies and interpretation of complex data.   Critical Care Time devoted to patient care services, exclusive of separately billable procedures, described in this note is 35 minutes.   Marshell Garfinkel MD [facility] Pager [phone] If no answer or after 3pm call: [phone] 10/05/2016, 8:58 AM

## 2016-10-05 NOTE — Procedures (Signed)
Extubation Procedure Note  Patient Details:   Name: Baxter FlatteryOmaure Melamed DOB: [birth year] MRN: [id number]   Airway Documentation:     Evaluation  O2 sats: stable throughout Complications: No apparent complications Patient did tolerate procedure well. Bilateral Breath Sounds: Clear, Diminished   Yes   Positive cuff leak noted. Pt placed on [place] 4 Lpm with humidity, no stridor noted, pt able to reach 750 using IS.  Forest BeckerJean S Terius Jacuinde 10/05/2016, 11:02 AM

## 2016-10-06 ENCOUNTER — Inpatient Hospital Stay (HOSPITAL_COMMUNITY): Payer: Medicare Other

## 2016-10-06 ENCOUNTER — Encounter (HOSPITAL_COMMUNITY): Payer: Self-pay

## 2016-10-06 LAB — DRVVT CONFIRM: DRVVT CONFIRM: 1.4 ratio — AB (ref 0.8–1.2)

## 2016-10-06 LAB — DIC (DISSEMINATED INTRAVASCULAR COAGULATION)PANEL
Fibrinogen: 366 mg/dL (ref 210–475)
Platelets: 291 10*3/uL (ref 150–400)
Prothrombin Time: 17.1 seconds — ABNORMAL HIGH (ref 11.4–15.2)
aPTT: 33 seconds (ref 24–36)

## 2016-10-06 LAB — CBC
HEMATOCRIT: 23.7 % — AB (ref 36.0–46.0)
HEMOGLOBIN: 7.5 g/dL — AB (ref 12.0–15.0)
MCH: 21.7 pg — ABNORMAL LOW (ref 26.0–34.0)
MCHC: 31.6 g/dL (ref 30.0–36.0)
MCV: 68.7 fL — AB (ref 78.0–100.0)
Platelets: 255 10*3/uL (ref 150–400)
RBC: 3.45 MIL/uL — ABNORMAL LOW (ref 3.87–5.11)
RDW: 15.9 % — ABNORMAL HIGH (ref 11.5–15.5)
WBC: 10 10*3/uL (ref 4.0–10.5)

## 2016-10-06 LAB — PHOSPHORUS: Phosphorus: 3.3 mg/dL (ref 2.5–4.6)

## 2016-10-06 LAB — DRVVT MIX: DRVVT MIX: 48.7 s — AB (ref 0.0–47.0)

## 2016-10-06 LAB — DIC (DISSEMINATED INTRAVASCULAR COAGULATION) PANEL
D DIMER QUANT: 11.43 ug{FEU}/mL — AB (ref 0.00–0.50)
INR: 1.38

## 2016-10-06 LAB — CULTURE, RESPIRATORY W GRAM STAIN

## 2016-10-06 LAB — CULTURE, RESPIRATORY: CULTURE: NORMAL

## 2016-10-06 LAB — BASIC METABOLIC PANEL
Anion gap: 7 (ref 5–15)
BUN: 28 mg/dL — ABNORMAL HIGH (ref 6–20)
CHLORIDE: 115 mmol/L — AB (ref 101–111)
CO2: 20 mmol/L — AB (ref 22–32)
Calcium: 7.9 mg/dL — ABNORMAL LOW (ref 8.9–10.3)
Creatinine, Ser: 1.25 mg/dL — ABNORMAL HIGH (ref 0.44–1.00)
GFR calc non Af Amer: >60 mL/min (ref >60)
GLUCOSE: 103 mg/dL — AB (ref 65–99)
POTASSIUM: 4 mmol/L (ref 3.5–5.1)
Sodium: 142 mmol/L (ref 135–145)

## 2016-10-06 LAB — MAGNESIUM: Magnesium: 2 mg/dL (ref 1.7–2.4)

## 2016-10-06 LAB — PHOSPHATIDYLSERINE ANTIBODIES
PHOSPHATYDALSERINE, IGA: 2 {APS'U} (ref 0–20)
Phosphatydalserine, IgG: 11 GPS IgG (ref 0–11)
Phosphatydalserine, IgM: 7 MPS IgM (ref 0–25)

## 2016-10-06 LAB — LUPUS ANTICOAGULANT PANEL
DRVVT: 64.6 s — ABNORMAL HIGH (ref 0.0–47.0)
PTT LA: 34.9 s (ref 0.0–51.9)

## 2016-10-06 MED ORDER — PNEUMOCOCCAL VAC POLYVALENT 25 MCG/0.5ML IJ INJ
0.5000 mL | INJECTION | INTRAMUSCULAR | Status: AC
  Administered 2016-10-07: 0.5 mL via INTRAMUSCULAR
  Filled 2016-10-06: qty 0.5

## 2016-10-06 MED ORDER — DOCUSATE SODIUM 50 MG/5ML PO LIQD: 100.0000 mg | Freq: Two times a day (BID) | ORAL | Status: DC | PRN

## 2016-10-06 MED ORDER — PREDNISONE 50 MG PO TABS
60.0000 mg | ORAL_TABLET | Freq: Every day | ORAL | Status: DC
  Administered 2016-10-07: 08:00:00 60 mg via ORAL
  Filled 2016-10-06: qty 1

## 2016-10-06 MED ORDER — ASPIRIN 81 MG PO CHEW
81.0000 mg | CHEWABLE_TABLET | Freq: Every day | ORAL | Status: DC
  Administered 2016-10-06 – 2016-10-12 (×7): 81 mg via ORAL
  Filled 2016-10-06 (×7): qty 1

## 2016-10-06 MED ORDER — PROSIGHT PO TABS
1.0000 | ORAL_TABLET | Freq: Every day | ORAL | Status: DC
  Administered 2016-10-06 – 2016-10-12 (×7): 1 via ORAL
  Filled 2016-10-06 (×7): qty 1

## 2016-10-06 NOTE — Progress Notes (Addendum)
PULMONARY / CRITICAL CARE MEDICINE   Name: Erika Boyer MRN: [id number] DOB: [birth year]    ADMISSION DATE:  10/03/2016 CONSULTATION DATE:  10/03/16  REFERRING MD:  Teresa Coombs, MD  CHIEF COMPLAINT:  Respiratory distress  HISTORY OF PRESENT ILLNESS:   21 year old with history of Graves' disease on methimazole, cerebral atrophy. Brought to the ED by her mother with 3 days of worsening dyspnea. Found to have temperature of 103 and a chest x-ray with large left pleural effusion, likely from severe community acquired pneumonia or viral illness. Also complains of increasing leg swelling for the past 2 weeks with redness, 1 episode of vomiting today. This was thought to be allergic reaction she was given 0.2 mg epinephrine, Solu-Medrol, albuterol and Atrovent by EMS. She continues to have significant respiratory distress and was placed on BiPAP and PCCM called for consultation. Noted to have elevated lactic acid, temperature of 103 with elevated WBC count.  She has a history of Graves' disease and cerebral atrophy. She moved a few weeks ago from [place], [place] and is establishing care here. There are no records in the system review. As per her mother she had a viral illness at age of 27 which caused cerebral atrophy. She has mental retardation, periods of agitation secondary to this and she has been seen by multiple neurologists prior to her move to [place].   SUBJECTIVE: Underwent thoracentesis and pericardiocentesis yesterday. Central venous line placed Urine output is picking up. Remains on the vent  VITAL SIGNS: BP 114/87 (BP Location: Left Arm)   Pulse 100   Temp 98.2 F (36.8 C) (Oral)   Resp (!) 27   Ht 5\' 5"  (1.651 m)   Wt 266 lb 5.1 oz (120.8 kg)   LMP  (LMP Unknown)   SpO2 99%   BMI 44.32 kg/m   HEMODYNAMICS: CVP:  [8 mmHg-12 mmHg] 10 mmHg  VENTILATOR SETTINGS: Vent Mode: CPAP;PSV FiO2 (%):  [40 %] 40 % PEEP:  [5 cmH20] 5 cmH20 Pressure Support:  [10 cmH20] 10  cmH20  INTAKE / OUTPUT: I/O last 3 completed shifts: In: 5014.9 [I.V.:4424.9; NG/GT:240; IV Piggyback:350] Out: 8295 [AOZHY:8657; Emesis/NG output:350]  PHYSICAL EXAMINATION: Blood pressure 105/78, pulse 79, temperature 97.3 F (36.3 C), temperature source Oral, resp. rate 16, height 5\' 5"  (1.651 m), weight 262 lb 5.6 oz (119 kg), SpO2 98 %. Gen:      No acute distress HEENT:  EOMI, sclera anicteric, ETT in place Neck:     No masses; no thyromegaly Lungs:    Clear to auscultation bilaterally; normal respiratory effort CV:         Regular rate and rhythm; no murmurs Abd:      + bowel sounds; soft, non-tender; no palpable masses, no distension Ext:    1-2 + edema; adequate peripheral perfusion Skin:      Excoriations over the B/L thighs.  LABS:  BMET  Recent Labs Lab 10/04/16 1944 10/05/16 0936 10/06/16 0417  NA 135 137 142  K 4.1 4.0 4.0  CL 109 112* 115*  CO2 18* 19* 20*  BUN 30* 29* 28*  CREATININE 2.10* 1.75* 1.25*  GLUCOSE 125* 154* 103*    Electrolytes  Recent Labs Lab 10/04/16 1944 10/05/16 0329 10/05/16 0936 10/05/16 1556 10/06/16 0417  CALCIUM 7.6*  --  7.5*  --  7.9*  MG 2.0 2.1  --  2.2 2.0  PHOS 3.9  3.9 4.1  --  2.7 3.3    CBC  Recent Labs Lab 10/04/16 0700 10/05/16  9147 10/06/16 0417  WBC 9.1 10.5 10.0  HGB 8.0* 7.1* 7.5*  HCT 25.2* 22.2* 23.7*  PLT 189 225 255    Coag's  Recent Labs Lab 10/03/16 1928  APTT 37*  INR 1.68    Sepsis Markers  Recent Labs Lab 10/03/16 1928  10/04/16 0700 10/04/16 0838 10/05/16 0329 10/05/16 0938 10/05/16 2100  LATICACIDVEN 2.3*  < >  --  0.9  --  0.9 0.8  PROCALCITON 1.81  --  2.50  --  1.81  --   --   < > = values in this interval not displayed.  ABG  Recent Labs Lab 10/03/16 1446 10/03/16 2055 10/04/16 0636  PHART 7.444 7.368 7.416  PCO2ART 23.7* 33.4 28.5*  PO2ART 54.0* 181* 70.0*    Liver Enzymes  Recent Labs Lab 10/03/16 1408 10/04/16 1944 10/05/16 0936  AST 34  --   14*  ALT 17  --  15  ALKPHOS 41  --  34*  BILITOT 1.4*  --  0.3  ALBUMIN 2.5* 2.2* 1.9*    Cardiac Enzymes  Recent Labs Lab 10/03/16 1928 10/03/16 2301 10/04/16 0700  TROPONINI 0.05* 0.03* <0.03    Glucose  Recent Labs Lab 10/03/16 2007 10/03/16 2347 10/04/16 1548 10/05/16 0731 10/05/16 1131 10/05/16 1926  GLUCAP 148* 175* 105* 121* 124* 75    Imaging  STUDIES:  CXR 5/16 > significant cardio megaly with large left effusion CT chest 5/16 > large circumferential pericardial effusion, large left pleural effusion with compressive atelectasias and ? L>R shift of mediastinum, small right pleural effusion  Serologies 10/04/16 ANA 1:1280 Homogenous, 1:160 speckled dsDNA > 300 Total complement 17 Smith ab >8  CULTURES: Bcx 5/16 > Tracheal aspirate  5/16 >  Sputum GS 5/16 >  UC 5/16 >  RSV 5/16 >  MRSA 5/16 > neg Pleural fluid 5/17 >> Pericardial fluid 5/17 >>  ANTIBIOTICS: Ceftriaxone 5/16 > Azithro 5/16 >  SIGNIFICANT EVENTS: 5/16 > admit with resp failure, intubated 5/17 > worsening UOP, CVC, Left thora, pericardiocentesis  5/18 > Extubated  LINES/TUBES: ETT 5/16 > 5/18 OGT 5/16 > 5/18 Foley 5/16 >  L TL IJ 5/17 >    DISCUSSION: 21 year old with cerebral atrophy, Graves' disease admitted with acute respiratory failure, sepsis. Noted to have a large lt pleural effusion and large pericardial effusion with early tamponade S/p drainage of pleural and pericardial fluid.  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 She may have superimposed CAP given the elevation on Pct.   ASSESSMENT / PLAN:  PULMONARY A: Acute resp failure Large left effusion, exudative CAP P:   Incentive spirometer.  Get OOB and ambulate Continue vent support Change solumedrol to prednisone 60 mg and can start taper tomorrow to 40 mg.  She would need to be on pred till seen by rheumatology as outpt  Follow pending studies ANCA titers, histone antibodies, ace  level  CARDIOVASCULAR A:  Pericarditis with Pericardial Effusion s/p pericardiocentesis Mildly elevated troponin- likely demand, peaked at 0.05 5/16 Elevated LA - improving  P:  Will need echo prior to discharge Started on colchicine per cardiology   RENAL A:   AKI. Improving Renal US is unremarkable P:   Follow urine output and Cr  GASTROINTESTINAL A:   No issues P:   PO diet  HEMATOLOGIC A:   Leukocytosis, likely from sepsis vs inflammation Sed rate 70 Iron deficient anemia P:  Trend CBC Will need iron supplementation at some point Check DIC labs, blood smear, haptoglobin  INFECTIOUS  A:   Sepsis CAP P:   Abx as above. Will give CAP coverage for 7 days Follow cultures Trend PCT  ENDOCRINE A:   Graves disease TSH elevated to 7 P:   Holding methimazole  NEUROLOGIC A:   Encephalopathy Cerebral atrophy P:   No issues  FAMILY  - Updates: Mother updated at the bedside 5/18. - Inter-disciplinary family meet or Palliative Care meeting due by:  5/23  Stable for transfer out of ICU.   Chilton GreathousePraveen Eloise Picone MD  Pulmonary and Critical Care Pager [phone] If no answer or after 3pm call: [phone] 10/06/2016, 9:08 AM

## 2016-10-06 NOTE — Progress Notes (Signed)
Pt arrived from [facility] via wheelchair. Very weak and requires 2 person assist. Left IJ and Peripheral IV in LAC and RAC. LAC bothering pt and request to be removed. Foley removed prior to transfer. Will continue to follow pt as needed.

## 2016-10-07 LAB — BASIC METABOLIC PANEL
ANION GAP: 6 (ref 5–15)
BUN: 25 mg/dL — ABNORMAL HIGH (ref 6–20)
CO2: 20 mmol/L — ABNORMAL LOW (ref 22–32)
Calcium: 7.9 mg/dL — ABNORMAL LOW (ref 8.9–10.3)
Chloride: 115 mmol/L — ABNORMAL HIGH (ref 101–111)
Creatinine, Ser: 1.07 mg/dL — ABNORMAL HIGH (ref 0.44–1.00)
GFR calc Af Amer: >60 mL/min (ref >60)
Glucose, Bld: 87 mg/dL (ref 65–99)
POTASSIUM: 3 mmol/L — AB (ref 3.5–5.1)
Sodium: 141 mmol/L (ref 135–145)

## 2016-10-07 LAB — CBC
HEMATOCRIT: 23.8 % — AB (ref 36.0–46.0)
HEMOGLOBIN: 7.5 g/dL — AB (ref 12.0–15.0)
MCH: 21.9 pg — ABNORMAL LOW (ref 26.0–34.0)
MCHC: 31.5 g/dL (ref 30.0–36.0)
MCV: 69.4 fL — ABNORMAL LOW (ref 78.0–100.0)
Platelets: 256 10*3/uL (ref 150–400)
RBC: 3.43 MIL/uL — AB (ref 3.87–5.11)
RDW: 16 % — ABNORMAL HIGH (ref 11.5–15.5)
WBC: 7.1 10*3/uL (ref 4.0–10.5)

## 2016-10-07 LAB — BODY FLUID CULTURE
Culture: NO GROWTH
SPECIAL REQUESTS: NORMAL

## 2016-10-07 LAB — CHOLESTEROL, BODY FLUID: CHOL FL: 56 mg/dL

## 2016-10-07 MED ORDER — FUROSEMIDE 20 MG PO TABS
20.0000 mg | ORAL_TABLET | Freq: Every day | ORAL | Status: DC
  Administered 2016-10-07 – 2016-10-12 (×6): 20 mg via ORAL
  Filled 2016-10-07 (×7): qty 1

## 2016-10-07 MED ORDER — POTASSIUM CHLORIDE CRYS ER 20 MEQ PO TBCR
40.0000 meq | EXTENDED_RELEASE_TABLET | Freq: Two times a day (BID) | ORAL | Status: AC
  Administered 2016-10-07 (×2): 40 meq via ORAL
  Filled 2016-10-07 (×2): qty 2

## 2016-10-07 MED ORDER — PREDNISONE 20 MG PO TABS
40.0000 mg | ORAL_TABLET | Freq: Every day | ORAL | Status: DC
  Administered 2016-10-08 – 2016-10-12 (×5): 40 mg via ORAL
  Filled 2016-10-07 (×5): qty 2

## 2016-10-07 MED ORDER — AZITHROMYCIN 500 MG PO TABS
500.0000 mg | ORAL_TABLET | Freq: Every day | ORAL | Status: AC
  Administered 2016-10-08 – 2016-10-09 (×2): 500 mg via ORAL
  Filled 2016-10-07 (×2): qty 1

## 2016-10-07 NOTE — Progress Notes (Addendum)
PROGRESS NOTE    Erika Boyer  OZH:[id number] DOB: 06-Dec-1995 DOA: 10/03/2016 PCP: Loletta Specter, PA-C  Brief Narrative:21 year old with history of Graves' disease on methimazole, cerebral atrophy. Brought to the ED by her mother with 3 days of worsening dyspnea. Found to have temperature of 103, hypoxia and a chest x-ray with large left pleural effusion and possible pneumonia. Admitted to ICU, intubated, Rx with Abx, CT chest 5/16 > large circumferential pericardial effusion, large left pleural effusion with compressive atelectasias and ? L>R shift of mediastinum, small right pleural effusion. ECHO with large pericardial effusion as well/ with early tamponade. She underwent Thoracentesis 5/17; 850 ml of cloudy pleural fluid was obtained And underwent Pericardiocentesis 5/17 per Dr.Ganji drained 870 mL of serosanguineous fluid, pericardial effusion completely resolved, started on Colchicine FOund to have abnormal Serologies 10/04/16:ANA 1:1280 Homogenous, 1:160 speckled, dsDNA > 300 Total complement 17, Smith ab >8-hence concerning for possibly Drug induced Lupus causing all of the above Cultures negative, started on Prednisone, extubated Transferred from [facility] to [facility] 5/20   Assessment & Plan:  Acute Resp failure -s/p VDRF, extubated 5/18 -see below, due to large pleural and pericardial effusion with early tamponade -improving -stop IVF  Large L pleural effusion -with L compressive atelectasis vs Pneumonia -s/p Thoracentesis 5/17; 850 ml of cloudy pleural fluid was obtained -FLuid analysis is exudative, cultures negative -Abnormal Serologies 10/04/16:ANA 1:1280 Homogenous, 1:160 speckled, dsDNA > 300 Total complement 17, Smith ab >8-hence concerning for SLE causing all of the above Cultures negative, started on Prednisone by Pulm -Methimazole held -CXR in am, stop IVF  Large Pericardial effusion -with early tamponade on ECHO -appreciate Cards input -underwent  Pericardiocentesis 5/17 per Dr.Ganji drained 870 mL of serosanguineous fluid, pericardial effusion completely resolved, started on Colchicine -suspect this is related to possible SLE/drug induced lupus as well -repeat ECHO in 2-3days  Anemia -due to acute illness/hemodilution and possibly low grade hemolysis from SLE -schistocytes noted in smear but Bili normal -haptoglobin pending  AKI -improving, stop IVF, renal US unremarkable  ? 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  Cerebral atrophy -stable  DVT prophylaxis:lovenox Code Status: Full Code Family Communication: boyfriend at bedside Disposition Plan: Home pending improvement 2-3days  Consultants:   PCCM  Cards   Procedures: Thoracentesis 5/17; 850 ml of cloudy pleural fluid was obtained  Pericardiocentesis 5/17 per Dr.Ganji drained 870 mL of serosanguineous fluid  Antimicrobials:   ROcephin/zithromax   Subjective: Breathing better, still very weak  Objective: Vitals:   10/06/16 2215 10/07/16 0532 10/07/16 0618 10/07/16 0700  BP: 127/87 132/87    Pulse: (!) 110 (!) 114    Resp: (!) 32 (!) 28  20  Temp: 98.2 F (36.8 C) 99.9 F (37.7 C)    TempSrc:  Oral    SpO2: 95% 100%    Weight:   125.8 kg (277 lb 4.8 oz)   Height:        Intake/Output Summary (Last 24 hours) at 10/07/16 1106 Last data filed at 10/07/16 0520  Gross per 24 hour  Intake          2860.42 ml  Output              200 ml  Net          2660.42 ml   Filed Weights   10/06/16 0400 10/06/16 1142 10/07/16 0618  Weight:  120.8 kg (266 lb 5.1 oz) 121.5 kg (267 lb 14.4 oz) 125.8 kg (277 lb 4.8 oz)    Examination:  General exam: Appears calm and comfortable, obese Respiratory system: diminished BS at bases Cardiovascular system: S1 & S2 heard, RRR. 1plus  pedal  edema. Gastrointestinal system: Abdomen is obese, distended, soft and nontender.Normal bowel sounds heard. Central nervous system: Alert and oriented. Slow congition Extremities: Symmetric 5 x 5 power. Skin: No rashes, lesions or ulcers Psychiatry: Judgement and insight appear normal. Mood & affect appropriate.     Data Reviewed:   CBC:  Recent Labs Lab 10/03/16 1408 10/04/16 0700 10/05/16 0936 10/06/16 0417 10/06/16 1843 10/07/16 0523  WBC 13.5* 9.1 10.5 10.0  --  7.1  NEUTROABS 11.0*  --   --   --   --   --   HGB 9.5* 8.0* 7.1* 7.5*  --  7.5*  HCT 29.0* 25.2* 22.2* 23.7*  --  23.8*  MCV 70.2* 69.8* 69.2* 68.7*  --  69.4*  PLT 241 189 225 255 291 256   Basic Metabolic Panel:  Recent Labs Lab 10/04/16 0700 10/04/16 1427 10/04/16 1944 10/05/16 0329 10/05/16 0936 10/05/16 1556 10/06/16 0417 10/07/16 0523  NA 134*  --  135  --  137  --  142 141  K 4.2  --  4.1  --  4.0  --  4.0 3.0*  CL 105  --  109  --  112*  --  115* 115*  CO2 17*  --  18*  --  19*  --  20* 20*  GLUCOSE 133*  --  125*  --  154*  --  103* 87  BUN 26*  --  30*  --  29*  --  28* 25*  CREATININE 1.90*  --  2.10*  --  1.75*  --  1.25* 1.07*  CALCIUM 7.6*  --  7.6*  --  7.5*  --  7.9* 7.9*  MG  --  2.0 2.0 2.1  --  2.2 2.0  --   PHOS  --  4.2 3.9  3.9 4.1  --  2.7 3.3  --    GFR: Estimated Creatinine Clearance: 111.9 mL/min (A) (by C-G formula based on SCr of 1.07 mg/dL (H)). Liver Function Tests:  Recent Labs Lab 10/03/16 1408 10/04/16 1427 10/04/16 1944 10/05/16 0936  AST 34  --   --  14*  ALT 17  --   --  15  ALKPHOS 41  --   --  34*  BILITOT 1.4*  --   --  0.3  PROT 7.1 5.8*  --  5.7*  ALBUMIN 2.5*  --  2.2* 1.9*   No results for input(s): LIPASE, AMYLASE in the last 168 hours. No results for input(s): AMMONIA in the last 168 hours. Coagulation Profile:  Recent Labs Lab 10/03/16 1928 10/06/16 1843  INR 1.68 1.38   Cardiac Enzymes:  Recent Labs Lab 10/03/16 1928  10/03/16 2301 10/04/16 0700  TROPONINI 0.05* 0.03* <0.03   BNP (last 3 results) No results for input(s): PROBNP in the last 8760 hours. HbA1C: No results for input(s): HGBA1C in the last 72 hours. CBG:  Recent Labs Lab 10/03/16 2347 10/04/16 1548 10/05/16 0731 10/05/16 1131 10/05/16 1926  GLUCAP 175* 105* 121* 124* 75   Lipid Profile:  Recent Labs  10/04/16 1430  CHOL 82   Thyroid Function Tests: No results for input(s): TSH, T4TOTAL, FREET4, T3FREE, THYROIDAB in the last 72 hours. Anemia Panel: No results  for input(s): VITAMINB12, FOLATE, FERRITIN, TIBC, IRON, RETICCTPCT in the last 72 hours. Urine analysis:    Component Value Date/Time   COLORURINE AMBER (A) 10/03/2016 2034   APPEARANCEUR HAZY (A) 10/03/2016 2034   LABSPEC 1.024 10/03/2016 2034   PHURINE 5.0 10/03/2016 2034   GLUCOSEU NEGATIVE 10/03/2016 2034   HGBUR SMALL (A) 10/03/2016 2034   BILIRUBINUR NEGATIVE 10/03/2016 2034   KETONESUR 5 (A) 10/03/2016 2034   PROTEINUR 100 (A) 10/03/2016 2034   NITRITE NEGATIVE 10/03/2016 2034   LEUKOCYTESUR NEGATIVE 10/03/2016 2034   Sepsis Labs: @LABRCNTIP (procalcitonin:4,lacticidven:4)  ) Recent Results (from the past 240 hour(s))  Blood Culture (routine x 2)     Status: None (Preliminary result)   Collection Time: 10/03/16  1:39 PM  Result Value Ref Range Status   Specimen Description BLOOD RIGHT ANTECUBITAL  Final   Special Requests   Final    BOTTLES DRAWN AEROBIC AND ANAEROBIC Blood Culture adequate volume   Culture NO GROWTH 3 DAYS  Final   Report Status PENDING  Incomplete  Blood Culture (routine x 2)     Status: None (Preliminary result)   Collection Time: 10/03/16  2:40 PM  Result Value Ref Range Status   Specimen Description BLOOD RIGHT HAND  Final   Special Requests   Final    BOTTLES DRAWN AEROBIC AND ANAEROBIC Blood Culture adequate volume   Culture NO GROWTH 3 DAYS  Final   Report Status PENDING  Incomplete  Culture, respiratory  (NON-Expectorated)     Status: None   Collection Time: 10/03/16  5:55 PM  Result Value Ref Range Status   Specimen Description TRACHEAL ASPIRATE  Final   Special Requests NONE  Final   Gram Stain   Final    MODERATE WBC PRESENT,BOTH PMN AND MONONUCLEAR FEW GRAM POSITIVE COCCI RARE GRAM POSITIVE RODS RARE GRAM NEGATIVE RODS    Culture Consistent with normal respiratory flora.  Final   Report Status 10/06/2016 FINAL  Final  MRSA PCR Screening     Status: None   Collection Time: 10/03/16  7:44 PM  Result Value Ref Range Status   MRSA by PCR NEGATIVE NEGATIVE Final    Comment:        The GeneXpert MRSA Assay (FDA approved for NASAL specimens only), is one component of a comprehensive MRSA colonization surveillance program. It is not intended to diagnose MRSA infection nor to guide or monitor treatment for MRSA infections.   Culture, Urine     Status: None   Collection Time: 10/03/16  8:34 PM  Result Value Ref Range Status   Specimen Description URINE, RANDOM  Final   Special Requests NONE  Final   Culture NO GROWTH  Final   Report Status 10/05/2016 FINAL  Final  Respiratory Panel by PCR     Status: None   Collection Time: 10/03/16  8:34 PM  Result Value Ref Range Status   Adenovirus NOT DETECTED NOT DETECTED Final   Coronavirus 229E NOT DETECTED NOT DETECTED Final   Coronavirus HKU1 NOT DETECTED NOT DETECTED Final   Coronavirus NL63 NOT DETECTED NOT DETECTED Final   Coronavirus OC43 NOT DETECTED NOT DETECTED Final   Metapneumovirus NOT DETECTED NOT DETECTED Final   Rhinovirus / Enterovirus NOT DETECTED NOT DETECTED Final   Influenza A NOT DETECTED NOT DETECTED Final   Influenza B NOT DETECTED NOT DETECTED Final   Parainfluenza Virus 1 NOT DETECTED NOT DETECTED Final   Parainfluenza Virus 2 NOT DETECTED NOT DETECTED Final   Parainfluenza Virus  3 NOT DETECTED NOT DETECTED Final   Parainfluenza Virus 4 NOT DETECTED NOT DETECTED Final   Respiratory Syncytial Virus NOT  DETECTED NOT DETECTED Final   Bordetella pertussis NOT DETECTED NOT DETECTED Final   Chlamydophila pneumoniae NOT DETECTED NOT DETECTED Final   Mycoplasma pneumoniae NOT DETECTED NOT DETECTED Final  Body fluid culture     Status: None (Preliminary result)   Collection Time: 10/04/16  2:01 PM  Result Value Ref Range Status   Specimen Description THORACENTESIS  Final   Special Requests Normal  Final   Gram Stain   Final    MODERATE WBC PRESENT,BOTH PMN AND MONONUCLEAR NO ORGANISMS SEEN    Culture NO GROWTH 3 DAYS  Final   Report Status PENDING  Incomplete  Culture, body fluid-bottle     Status: None (Preliminary result)   Collection Time: 10/04/16  5:42 PM  Result Value Ref Range Status   Specimen Description FLUID PERICARDIAL  Final   Special Requests BOTTLES DRAWN AEROBIC AND ANAEROBIC 10CC  Final   Culture NO GROWTH 2 DAYS  Final   Report Status PENDING  Incomplete  Gram stain     Status: None   Collection Time: 10/04/16  5:42 PM  Result Value Ref Range Status   Specimen Description FLUID PERICARDIAL  Final   Special Requests NONE  Final   Gram Stain   Final    ABUNDANT WBC PRESENT,BOTH PMN AND MONONUCLEAR NO ORGANISMS SEEN    Report Status 10/04/2016 FINAL  Final         Radiology Studies: Dg Chest Port 1 View  Result Date: 10/06/2016 CLINICAL DATA:  Acute respiratory failure. EXAM: PORTABLE CHEST 1 VIEW COMPARISON:  10/05/2016 and CT chest 10/03/2016. FINDINGS: Left IJ central line tip projects over the SVC. Interval extubation. Nasogastric has been removed. Cardiopericardial silhouette is enlarged, stable. Lungs are low in volume with mild diffuse airspace opacification and a moderate to large left pleural effusion. Left lower lobe collapse/consolidation. Findings are similar to yesterday's exam. IMPRESSION: 1. Left lower lobe collapse/consolidation, worrisome for pneumonia. 2. Enlarged cardiopericardial silhouette, shown to represent a large pericardial effusion on  10/03/2016. 3. Moderate to large left pleural effusion. 4. Mild diffuse bilateral airspace opacification may be due to low lung volumes and atelectasis. Difficult to exclude edema. Electronically Signed   By: Leanna Battles M.D.   On: 10/06/2016 07:36        Scheduled Meds: . aspirin  81 mg Oral Daily  . Chlorhexidine Gluconate Cloth  6 each Topical Daily  . colchicine  0.6 mg Oral BID  . enoxaparin (LOVENOX) injection  40 mg Subcutaneous Q24H  . furosemide  20 mg Oral Daily  . multivitamin  1 tablet Oral Daily  . pneumococcal 23 valent vaccine  0.5 mL Intramuscular Tomorrow-1000  . potassium chloride  40 mEq Oral BID  . predniSONE  60 mg Oral Q breakfast  . sodium chloride flush  10-40 mL Intracatheter Q12H   Continuous Infusions: . azithromycin Stopped (10/07/16 0906)  . cefTRIAXone (ROCEPHIN)  IV Stopped (10/07/16 0550)     LOS: 4 days    Time spent: 36    Zannie Cove, MD [facility] Pager [phone]  If 7PM-7AM, please contact night-coverage [email or web address] Password [email or web address] 10/07/2016, 11:06 AM

## 2016-10-07 NOTE — Progress Notes (Signed)
Rehab Admissions Coordinator Note:  Patient was screened by Trish MageLogue, Keonta Alsip M for appropriateness for an Inpatient Acute Rehab Consult.  At this time, we are recommending Inpatient Rehab consult.  Lelon FrohlichLogue, Klarisa Barman M 10/07/2016, 7:25 PM  I can be reached at [phone].

## 2016-10-07 NOTE — Evaluation (Addendum)
Physical Therapy Evaluation Patient Details Name: Erika FlatteryOmaure Hamza MRN: [id number] DOB: 14-Jul-1995 Today's Date: 10/07/2016   History of Present Illness  21 year old with history of Graves' disease on methimazole, cerebral atrophy. Brought to the ED by her mother with 3 days of worsening dyspnea. Found to have temperature of 103 and a chest x-ray with large left pleural effusion, likely from severe community acquired pneumonia or viral illness.  Clinical Impression  Pt admitted with above diagnosis. Pt currently with functional limitations due to the deficits listed below (see PT Problem List). On eval, pt required min assist for all functional mobility, including ambulation with RW 5 feet. Pt presents with decreased activity tolerance and muscular weakness. O2 sats at 93% on RA with HR of 125 following mobility. Pt was independent PTA and demo good motivation to participate with therapy. Pt will benefit from skilled PT to increase their independence and safety with mobility to allow discharge to the venue listed below.       Follow Up Recommendations CIR    Equipment Recommendations  None recommended by PT    Recommendations for Other Services Rehab consult     Precautions / Restrictions Precautions Precautions: Fall      Mobility  Bed Mobility Overal bed mobility: Needs Assistance Bed Mobility: Supine to Sit;Sit to Supine     Supine to sit: Min assist Sit to supine: Min assist      Transfers Overall transfer level: Needs assistance Equipment used: Rolling walker (2 wheeled) Transfers: Sit to/from Stand Sit to Stand: Min assist         General transfer comment: Sit to stand x 3 trial for strengthening.  Ambulation/Gait Ambulation/Gait assistance: Min assist Ambulation Distance (Feet): 5 Feet Assistive device: Rolling walker (2 wheeled) Gait Pattern/deviations: Step-through pattern;Decreased stride length;Wide base of support Gait velocity: decreased Gait velocity  interpretation: Below normal speed for age/gender General Gait Details: decreased BLE strength resulting in unsteady gait with wide BOS. Pt on RA with sats 93% and HR 125.  Stairs            Wheelchair Mobility    Modified Rankin (Stroke Patients Only)       Balance Overall balance assessment: Needs assistance Sitting-balance support: No upper extremity supported;Feet supported Sitting balance-Leahy Scale: Good     Standing balance support: Bilateral upper extremity supported;During functional activity   Standing balance comment: reliance on RW                             Pertinent Vitals/Pain Pain Assessment: Faces Faces Pain Scale: Hurts little more Pain Location: back Pain Descriptors / Indicators: Sore Pain Intervention(s): Repositioned;Monitored during session    Home Living Family/patient expects to be discharged to:: Private residence Living Arrangements: Parent Available Help at Discharge: Family;Available 24 hours/day Type of Home: Apartment Home Access: Stairs to enter   Entrance Stairs-Number of Steps: 1 Home Layout: Two level;Bed/bath upstairs Home Equipment: Walker - 2 wheels Additional Comments: Obtained RW after moving to [place], but only used in occassionally in the last few days prior to hospitalization.    Prior Function Level of Independence: Independent         Comments: Prior to moving to [place] several weeks ago. Pt was active, worked out at [employer] gym.      Hand Dominance   Dominant Hand: Right    Extremity/Trunk Assessment   Upper Extremity Assessment Upper Extremity Assessment: Generalized weakness    Lower Extremity Assessment  Lower Extremity Assessment: Generalized weakness    Cervical / Trunk Assessment Cervical / Trunk Assessment: Normal  Communication   Communication: No difficulties  Cognition Arousal/Alertness: Awake/alert Behavior During Therapy: WFL for tasks assessed/performed Overall Cognitive Status:  Within Functional Limits for tasks assessed                                        General Comments      Exercises     Assessment/Plan    PT Assessment Patient needs continued PT services  PT Problem List Decreased strength;Decreased activity tolerance;Decreased balance;Decreased mobility;Decreased knowledge of use of DME;Cardiopulmonary status limiting activity;Obesity       PT Treatment Interventions DME instruction;Gait training;Stair training;Functional mobility training;Therapeutic activities;Therapeutic exercise;Balance training;Patient/family education    PT Goals (Current goals can be found in the Care Plan section)  Acute Rehab PT Goals Patient Stated Goal: independence PT Goal Formulation: With patient Time For Goal Achievement: 10/21/16 Potential to Achieve Goals: Good    Frequency Min 4X/week   Barriers to discharge        Co-evaluation               AM-PAC PT "6 Clicks" Daily Activity  Outcome Measure Difficulty turning over in bed (including adjusting bedclothes, sheets and blankets)?: A Little Difficulty moving from lying on back to sitting on the side of the bed? : Total Difficulty sitting down on and standing up from a chair with arms (e.g., wheelchair, bedside commode, etc,.)?: Total Help needed moving to and from a bed to chair (including a wheelchair)?: A Little Help needed walking in hospital room?: A Little Help needed climbing 3-5 steps with a railing? : A Lot 6 Click Score: 13    End of Session Equipment Utilized During Treatment: Gait belt Activity Tolerance: Patient limited by fatigue (Pt sitting EOB finishing breakfast on arrival. Reports transfering to [facility] with nursing prior to therapist arrival.) Patient left: in bed;with call bell/phone within reach;with family/visitor present;with SCD's reapplied Nurse Communication: Mobility status PT Visit Diagnosis: Muscle weakness (generalized) (M62.81);Other abnormalities of  gait and mobility (R26.89)    Time: 1610-9604 PT Time Calculation (min) (ACUTE ONLY): 14 min   Charges:   PT Evaluation $PT Eval Moderate Complexity: 1 Procedure     PT G Codes:        Aida Raider, PT  Office # [phone] Pager [phone]   Ilda Foil 10/07/2016, 9:56 AM

## 2016-10-07 NOTE — Progress Notes (Signed)
Pt just got up in chair.  Wants to wait til back in bed to remove CVC and start PIV.   RN notified and will consult once in bed.

## 2016-10-08 ENCOUNTER — Inpatient Hospital Stay (HOSPITAL_COMMUNITY): Payer: Medicare Other

## 2016-10-08 DIAGNOSIS — E876 Hypokalemia: Secondary | ICD-10-CM

## 2016-10-08 DIAGNOSIS — R000 Tachycardia, unspecified: Secondary | ICD-10-CM

## 2016-10-08 DIAGNOSIS — L932 Other local lupus erythematosus: Secondary | ICD-10-CM

## 2016-10-08 DIAGNOSIS — D62 Acute posthemorrhagic anemia: Secondary | ICD-10-CM

## 2016-10-08 DIAGNOSIS — E0500 Thyrotoxicosis with diffuse goiter without thyrotoxic crisis or storm: Secondary | ICD-10-CM

## 2016-10-08 DIAGNOSIS — M7989 Other specified soft tissue disorders: Secondary | ICD-10-CM

## 2016-10-08 DIAGNOSIS — F79 Unspecified intellectual disabilities: Secondary | ICD-10-CM

## 2016-10-08 DIAGNOSIS — G319 Degenerative disease of nervous system, unspecified: Secondary | ICD-10-CM

## 2016-10-08 DIAGNOSIS — Z6841 Body Mass Index (BMI) 40.0 and over, adult: Secondary | ICD-10-CM

## 2016-10-08 DIAGNOSIS — Z9889 Other specified postprocedural states: Secondary | ICD-10-CM

## 2016-10-08 DIAGNOSIS — T50905A Adverse effect of unspecified drugs, medicaments and biological substances, initial encounter: Secondary | ICD-10-CM

## 2016-10-08 DIAGNOSIS — R0602 Shortness of breath: Secondary | ICD-10-CM

## 2016-10-08 LAB — CBC
HCT: 23.3 % — ABNORMAL LOW (ref 36.0–46.0)
Hemoglobin: 7.3 g/dL — ABNORMAL LOW (ref 12.0–15.0)
MCH: 21.9 pg — ABNORMAL LOW (ref 26.0–34.0)
MCHC: 31.3 g/dL (ref 30.0–36.0)
MCV: 69.8 fL — ABNORMAL LOW (ref 78.0–100.0)
PLATELETS: 233 10*3/uL (ref 150–400)
RBC: 3.34 MIL/uL — AB (ref 3.87–5.11)
RDW: 15.9 % — ABNORMAL HIGH (ref 11.5–15.5)
WBC: 8.4 10*3/uL (ref 4.0–10.5)

## 2016-10-08 LAB — BASIC METABOLIC PANEL
ANION GAP: 4 — AB (ref 5–15)
BUN: 18 mg/dL (ref 6–20)
CO2: 20 mmol/L — ABNORMAL LOW (ref 22–32)
Calcium: 8 mg/dL — ABNORMAL LOW (ref 8.9–10.3)
Chloride: 115 mmol/L — ABNORMAL HIGH (ref 101–111)
Creatinine, Ser: 0.88 mg/dL (ref 0.44–1.00)
GFR calc Af Amer: >60 mL/min (ref >60)
Glucose, Bld: 100 mg/dL — ABNORMAL HIGH (ref 65–99)
Potassium: 3.3 mmol/L — ABNORMAL LOW (ref 3.5–5.1)
SODIUM: 139 mmol/L (ref 135–145)

## 2016-10-08 LAB — CBC WITH DIFFERENTIAL/PLATELET
BASOS ABS: 0 10*3/uL (ref 0.0–0.1)
BASOS PCT: 0 %
EOS PCT: 0 %
Eosinophils Absolute: 0 10*3/uL (ref 0.0–0.7)
HCT: 26.1 % — ABNORMAL LOW (ref 36.0–46.0)
Hemoglobin: 8.1 g/dL — ABNORMAL LOW (ref 12.0–15.0)
Lymphocytes Relative: 10 %
Lymphs Abs: 0.9 10*3/uL (ref 0.7–4.0)
MCH: 21.8 pg — ABNORMAL LOW (ref 26.0–34.0)
MCHC: 31 g/dL (ref 30.0–36.0)
MCV: 70.2 fL — AB (ref 78.0–100.0)
Monocytes Absolute: 0.2 10*3/uL (ref 0.1–1.0)
Monocytes Relative: 2 %
Neutro Abs: 7.5 10*3/uL (ref 1.7–7.7)
Neutrophils Relative %: 88 %
PLATELETS: 291 10*3/uL (ref 150–400)
RBC: 3.72 MIL/uL — AB (ref 3.87–5.11)
RDW: 16 % — AB (ref 11.5–15.5)
WBC: 8.5 10*3/uL (ref 4.0–10.5)

## 2016-10-08 LAB — CULTURE, BLOOD (ROUTINE X 2)
CULTURE: NO GROWTH
CULTURE: NO GROWTH
Special Requests: ADEQUATE
Special Requests: ADEQUATE

## 2016-10-08 LAB — ANCA TITERS
Atypical P-ANCA titer: <1:20 {titer}
C-ANCA: <1:20 {titer}
P-ANCA: <1:20 {titer}

## 2016-10-08 LAB — RHEUMATOID FACTORS, FLUID

## 2016-10-08 LAB — HAPTOGLOBIN: HAPTOGLOBIN: 321 mg/dL — AB (ref 34–200)

## 2016-10-08 LAB — ADENOSIDE DEAMINASE, PLEURAL FL: ADENOSIDE DEAMINASE, PLEURAL FL: 6.8 U/L (ref 0.0–9.4)

## 2016-10-08 LAB — SAVE SMEAR

## 2016-10-08 MED ORDER — METOPROLOL TARTRATE 25 MG PO TABS
25.0000 mg | ORAL_TABLET | Freq: Two times a day (BID) | ORAL | Status: DC
  Administered 2016-10-08 – 2016-10-12 (×9): 25 mg via ORAL
  Filled 2016-10-08 (×9): qty 1

## 2016-10-08 NOTE — Progress Notes (Signed)
PULMONARY / CRITICAL CARE MEDICINE   Name: Erika Boyer MRN: [id number] DOB: 05-02-1996    ADMISSION DATE:  10/03/2016 CONSULTATION DATE:  10/03/16  REFERRING MD:  Teresa CoombsMerrell D, MD  CHIEF COMPLAINT:  Respiratory distress  HISTORY OF PRESENT ILLNESS:   21 year old with history of Graves' disease on methimazole, cerebral atrophy. Brought to the ED by her mother with 3 days of worsening dyspnea. Found to have temperature of 103 and a chest x-ray with large left pleural effusion, likely from severe community acquired pneumonia or viral illness. Also complains of increasing leg swelling for the past 2 weeks with redness, 1 episode of vomiting today. This was thought to be allergic reaction she was given 0.2 mg epinephrine, Solu-Medrol, albuterol and Atrovent by EMS. She continues to have significant respiratory distress and was placed on BiPAP and PCCM called for consultation. Noted to have elevated lactic acid, temperature of 103 with elevated WBC count.  She has a history of Graves' disease and cerebral atrophy. She moved a few weeks ago from [place] and is establishing care here. There are no records in the system review. As per her mother she had a viral illness at age of 21 which caused cerebral atrophy. She has mental retardation, periods of agitation secondary to this and she has been seen by multiple neurologists prior to her move to [place].   SUBJECTIVE: No distress. Weak No chest pain  VITAL SIGNS: BP (!) 144/96   Pulse 84   Temp 99.8 F (37.7 C) (Oral)   Resp 18   Ht 5\' 5"  (1.651 m)   Wt 277 lb 6.4 oz (125.8 kg)   LMP 10/08/2016   SpO2 95%   BMI 46.16 kg/m   Room air   INTAKE / OUTPUT: I/O last 3 completed shifts: In: 2170 [P.O.:120; I.V.:1500; IV Piggyback:550] Out: 200 [Urine:200]   General appearance:  21 Year old  Obese female,  NAD, conversant  Eyes: anicteric sclerae, moist conjunctivae; PERRL, EOMI bilaterally. Mouth:  membranes and no mucosal  ulcerations; normal hard and soft palate Neck: Trachea midline; neck supple, no JVD Lungs/chest: CTA, decreased on left  with normal respiratory effort and no intercostal retractions CV: RRR, no MRGs  Abdomen: Soft, non-tender; no masses or HSM Extremities: No peripheral edema or extremity lymphadenopathy Skin: Normal temperature, turgor and texture; no rash, ulcers or subcutaneous nodules Neuro/Psych: flat affect, alert and oriented to person, place and time   LABS:  BMET  Recent Labs Lab 10/06/16 0417 10/07/16 0523 10/08/16 0543  NA 142 141 139  K 4.0 3.0* 3.3*  CL 115* 115* 115*  CO2 20* 20* 20*  BUN 28* 25* 18  CREATININE 1.25* 1.07* 0.88  GLUCOSE 103* 87 100*    Electrolytes  Recent Labs Lab 10/05/16 0329  10/05/16 1556 10/06/16 0417 10/07/16 0523 10/08/16 0543  CALCIUM  --   < >  --  7.9* 7.9* 8.0*  MG 2.1  --  2.2 2.0  --   --   PHOS 4.1  --  2.7 3.3  --   --   < > = values in this interval not displayed.  CBC  Recent Labs Lab 10/06/16 0417 10/06/16 1843 10/07/16 0523 10/08/16 0543  WBC 10.0  --  7.1 8.4  HGB 7.5*  --  7.5* 7.3*  HCT 23.7*  --  23.8* 23.3*  PLT 255 291 256 233    Coag's  Recent Labs Lab 10/03/16 1928 10/06/16 1843  APTT 37* 33  INR 1.68 1.38  Sepsis Markers  Recent Labs Lab 10/03/16 1928  10/04/16 0700 10/04/16 0838 10/05/16 0329 10/05/16 0938 10/05/16 2100  LATICACIDVEN 2.3*  < >  --  0.9  --  0.9 0.8  PROCALCITON 1.81  --  2.50  --  1.81  --   --   < > = values in this interval not displayed.  ABG  Recent Labs Lab 10/03/16 1446 10/03/16 2055 10/04/16 0636  PHART 7.444 7.368 7.416  PCO2ART 23.7* 33.4 28.5*  PO2ART 54.0* 181* 70.0*    Liver Enzymes  Recent Labs Lab 10/03/16 1408 10/04/16 1944 10/05/16 0936  AST 34  --  14*  ALT 17  --  15  ALKPHOS 41  --  34*  BILITOT 1.4*  --  0.3  ALBUMIN 2.5* 2.2* 1.9*    Cardiac Enzymes  Recent Labs Lab 10/03/16 1928 10/03/16 2301  10/04/16 0700  TROPONINI 0.05* 0.03* <0.03    Glucose  Recent Labs Lab 10/03/16 2007 10/03/16 2347 10/04/16 1548 10/05/16 0731 10/05/16 1131 10/05/16 1926  GLUCAP 148* 175* 105* 121* 124* 75    Imaging  STUDIES:  CXR 5/16 > significant cardio megaly with large left effusion CT chest 5/16 > large circumferential pericardial effusion, large left pleural effusion with compressive atelectasias and ? L>R shift of mediastinum, small right pleural effusion  Serologies 10/04/16 ANA 1:1280 Homogenous, 1:160 speckled dsDNA > 300 Total complement 17 Smith ab >8  CULTURES: Bcx 5/16 > Tracheal aspirate  5/16 > nml flora  Sputum GS 5/16 >  UC 5/16 > neg RSV 5/16 > neg MRSA 5/16 > neg Pleural fluid 5/17 >>neg Pericardial fluid 5/17 >>neg  ANTIBIOTICS: Ceftriaxone 5/16 > Azithro 5/16 >  SIGNIFICANT EVENTS: 5/16 > admit with resp failure, intubated 5/17 > worsening UOP, CVC, Left thora, pericardiocentesis  5/18 > Extubated  LINES/TUBES: ETT 5/16 > 5/18 OGT 5/16 > 5/18 Foley 5/16 > out  L TL IJ 5/17 >  Out    ASSESSMENT / PLAN:  Acute resp failure in setting of large left effusion and pericardial effusion +/- CAP Exudative effusion  -extubated 5/18 -effusion exudate -effusion remains mod. WOB acceptable -lupus DRVVAT -->elevated  -cxr: mod left effusion (partially loculated)/ low lung volume (personally reviewed) Plan:   Cont pred at 40mg /d-->hold here; needs to be seen by rheum as outpt Awaiting ANCA titers, histone ab and ace level  Will hold off on repeat thora-->should go away w/ steroids. Would repeat CXR in 7d if no improvement at that point will need repeat thora. -->IF SHE is rehab can have it then Day 5/7 antibiotics  have set her up with Dr Isaiah Serge at 915 on May 29. She will need to come in at 845 to check in-->then CXR followed by her appointment   Pericarditis with Pericardial Effusion s/p pericardiocentesis Mildly elevated troponin- likely demand,  peaked at 0.05 5/16 Elevated LA - improving  Plan:  Will need echo prior to discharge Started on colchicine per cardiology   AKI. 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 There was question of CAP given elevated PCT but all culture have been neg. The plan at this point is to  cont current steroid dosing. She needs a follow up cxr. If she is still in the hospital for rehab this should be obtained in about 7 days. IF effusion still present or if symptoms worsen then will need repeat thoracentesis at that point. If she leaves the hospital w/in the next week she has follow up arranged w/ Dr Alvia Grove on 5/29.  Simonne Martinet ACNP-BC [facility] Pulmonary/Critical Care Pager # [phone] OR # [phone] if no answer  Attending:  I have seen and examined the patient with nurse practitioner/resident and agree with the note above.  We formulated the plan together and I elicited the following history.    Breathing has improved Has been on her feet some Doesn't want thoracentesis  On exam Vitals:   10/07/16 2302 10/08/16 0607 10/08/16 0645 10/08/16 1052  BP: 137/70 (!) 142/100  (!) 144/96  Pulse: (!) 107 (!) 112  84  Resp: 18 18    Temp: 98.3 F (36.8 C) 99.8 F (37.7 C)    TempSrc: Oral Oral    SpO2: 99% 95%    Weight:   277 lb 6.4 oz (125.8 kg)   Height:       Lungs: diminished breath sounds on left CV: RRR, nomgr GI: BS+, soft, nontender  CXR images independently reveiwed> left sided effusion noted, cardiomegally  Labs: ANA positive, DS DNA positive  Bedside ultrasound> some residual effusion  Exudative left  pleural effusion> presumably due to lupus pleuritis given serology studies; at this point she is feeling better and requests to avoid repeat thoracentesis is possible.  Will continue to treat with steroids, repeat a CXR in 1 week.  If effusion not improving in size then consider repeat thoracentesis.  If worse in the meantime then repeat thoracentesis.  Would treat with prednisone 40mg  daily until seen in office next week.  Discussed with Dr. Abelino Derrick, MD  PCCM Pager: [phone] Cell: [phone] After 3pm or if no response, call [phone]

## 2016-10-08 NOTE — Consult Note (Signed)
Physical Medicine and Rehabilitation Consult  Reason for Consult:  Debility.  Referring Physician: Dr. Jomarie LongsJoseph.    HPI: Erika Boyer is a 21 y.o. female with history of, cerebral atrophy due to viral illness at age 21, MR with bouts of agitation, morbid obesity, Graves disease;  who was admitted on 10/03/16 with 3 day history of progressive SOB, BLE edema with erythema X 2 weeks and acute renal failure. CT chest reviewed, showing pleural and pericardial effusion. She was found to have large left pleural effusion due to HAP or viral illness as well as large pericardial effusion. She was  Intubated in ED and underwent left thoracocentesis for evacuation of 850 cc of cloudy exudative pleural effusion and on steroids by PCCM. 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   2D echo with EF 60-65% with severe circumferential effusion and she underwent pericardiocentesis of 870 cc of serosanguineous fluid suspected to be due to acute peritonitis v/s viral pericarditis. She was placed on colchicine with recommendations to continue X 3 months and follow up for repeat echo 1-2 days prior to discharge.  She tolerated extubation on  5/18 and noted to be debilitated with substantial deficits in mobility. CIR recommended for follow up therapy.   [place] and [place] from [place] a couple of months ago per boyfriend. Lives with mother and boyfriend there "most of the time". Mother manages home and provides supervision. She was independent--sedentary and watches TV most of the day. Was using walker "for safety" and when out of home recently.     Review of Systems  HENT: Negative for hearing loss and tinnitus.   Eyes: Positive for blurred vision.  Respiratory: Positive for shortness of breath.   Cardiovascular: Negative for chest pain and palpitations.  Gastrointestinal: Positive for abdominal pain. Negative for heartburn  and nausea.  Genitourinary: Negative for dysuria.  Musculoskeletal: Positive for back pain, joint pain (right knee pain due to fall earlier this year. ) and myalgias.  Neurological: Positive for weakness. Negative for dizziness, speech change, focal weakness and headaches.  Psychiatric/Behavioral: The patient is not nervous/anxious.   All other systems reviewed and are negative.     Past Medical History:  Diagnosis Date  . Cerebellar degeneration   . Graves disease 2012    Past Surgical History:  Procedure Laterality Date  . BREAST REDUCTION SURGERY    . PERICARDIOCENTESIS N/A 10/04/2016   Procedure: Pericardiocentesis;  Surgeon: Yates DecampGanji, Jay, MD;  [facility];  Service: Cardiovascular;  Laterality: N/A;  . TONSILLECTOMY      Family Hx:  Unknown not in the room.     Social History:  Lives with mother. Disabled. She reports that she has never smoked. She has never used smokeless tobacco. Her alcohol and drug histories are not on file.    Allergies  Allergen Reactions  . Shellfish Allergy Anaphylaxis  . Morphine And Related Itching  . Mushroom Extract Complex Other (See Comments)    Mother is allergic, so patient avoids these  . Tapazole [Methimazole] Swelling and Rash    WELTS (also) Mother suspects this MAY be an allergy, as the onset of symptoms coincided with this being started    Medications Prior to Admission  Medication Sig Dispense Refill  . albuterol (PROAIR HFA) 108 (90 Base) MCG/ACT inhaler Inhale 2 puffs into the lungs every 4 (four) hours as needed for wheezing or  shortness of breath.    . EPINEPHrine (EPIPEN 2-PAK) 0.3 mg/0.3 mL IJ SOAJ injection Inject 0.3 mg into the muscle once as needed (for anaphylaxis).     Marland Kitchen ibuprofen (ADVIL,MOTRIN) 600 MG tablet Take 600 mg by mouth every 6 (six) hours as needed (for pain).    . methimazole (TAPAZOLE) 10 MG tablet Take 1.5 tablets (15 mg total) by mouth daily. 45 tablet 2    Home: Home  Living Family/patient expects to be discharged to:: Private residence Living Arrangements: Parent Available Help at Discharge: Family, Available 24 hours/day Type of Home: Apartment Home Access: Stairs to enter [employer] of Steps: 1 Home Layout: Two level, Bed/bath upstairs Alternate Level Stairs-Number of Steps: flight Bathroom Toilet: Standard Home Equipment: Walker - 2 wheels Additional Comments: Obtained RW after moving to [place], but only used in occassionally in the last few days prior to hospitalization.  Functional History: Prior Function Level of Independence: Independent Comments: Prior to moving to [place] several weeks ago. Pt was active, worked out at [employer]  Functional Status:  Mobility: Bed Mobility Overal bed mobility: Needs Assistance Bed Mobility: Supine to Sit, Sit to Supine Supine to sit: Min assist Sit to supine: Min assist Transfers Overall transfer level: Needs assistance Equipment used: Rolling walker (2 wheeled) Transfers: Sit to/from Stand Sit to Stand: Min assist General transfer comment: Sit to stand x 3 trial for strengthening. Ambulation/Gait Ambulation/Gait assistance: Min assist Ambulation Distance (Feet): 5 Feet Assistive device: Rolling walker (2 wheeled) Gait Pattern/deviations: Step-through pattern, Decreased stride length, Wide base of support General Gait Details: decreased BLE strength resulting in unsteady gait with wide BOS. Pt on RA with sats 93% and HR 125. Gait velocity: decreased Gait velocity interpretation: Below normal speed for age/gender    ADL:    Cognition: Cognition Overall Cognitive Status: Within Functional Limits for tasks assessed Orientation Level: Oriented X4 Cognition Arousal/Alertness: Awake/alert Behavior During Therapy: WFL for tasks assessed/performed Overall Cognitive Status: Within Functional Limits for tasks assessed   Blood pressure (!) 142/100, pulse (!) 112, temperature 99.8 F (37.7 C),  temperature source Oral, resp. rate 18, height 5\' 5"  (1.651 m), weight 125.8 kg (277 lb 6.4 oz), last menstrual period 10/08/2016, SpO2 95 %. Physical Exam  Nursing note and vitals reviewed. Constitutional: She is oriented to person, place, and time. She appears well-developed. She is cooperative.  Obese  HENT:  Head: Normocephalic and atraumatic.  Eyes: Conjunctivae and EOM are normal. Pupils are equal, round, and reactive to light.  Neck: Normal range of motion. Neck supple.  Cardiovascular: Regular rhythm.  Tachycardia present.   Respiratory: No accessory muscle usage or stridor. No respiratory distress. She has decreased breath sounds in the left middle field and the left lower field.  Audible wheeze with minimal exertion.  Increased WOB  GI: Soft. Bowel sounds are normal. She exhibits no distension. There is no tenderness.  Musculoskeletal: She exhibits edema. She exhibits no tenderness.  2 + edema BLE  Neurological: She is alert and oriented to person, place, and time.  Speech slow but clear.  Able to answer orientation questions and recall today's date with increased time.  Able to follow simple one and two step motor commands. Intentional tremors BUE with mild ataxia.  Motor: 4/5 grossly throughout Sensation intact to light touch  Skin: Skin is warm and dry.  Psychiatric: Her affect is blunt. Her speech is delayed. She is slowed.    Results for orders placed or performed during the hospital encounter of  10/03/16 (from the past 24 hour(s))  CBC     Status: Abnormal   Collection Time: 10/08/16  5:43 AM  Result Value Ref Range   WBC 8.4 4.0 - 10.5 K/uL   RBC 3.34 (L) 3.87 - 5.11 MIL/uL   Hemoglobin 7.3 (L) 12.0 - 15.0 g/dL   HCT 19.1 (L) 47.8 - 29.5 %   MCV 69.8 (L) 78.0 - 100.0 fL   MCH 21.9 (L) 26.0 - 34.0 pg   MCHC 31.3 30.0 - 36.0 g/dL   RDW 62.1 (H) 30.8 - 65.7 %   Platelets 233 150 - 400 K/uL  Basic metabolic panel     Status: Abnormal   Collection Time: 10/08/16   5:43 AM  Result Value Ref Range   Sodium 139 135 - 145 mmol/L   Potassium 3.3 (L) 3.5 - 5.1 mmol/L   Chloride 115 (H) 101 - 111 mmol/L   CO2 20 (L) 22 - 32 mmol/L   Glucose, Bld 100 (H) 65 - 99 mg/dL   BUN 18 6 - 20 mg/dL   Creatinine, Ser 8.46 0.44 - 1.00 mg/dL   Calcium 8.0 (L) 8.9 - 10.3 mg/dL   GFR calc non Af Amer >60 >60 mL/min   GFR calc Af Amer >60 >60 mL/min   Anion gap 4 (L) 5 - 15   Dg Chest 2 View  Result Date: 10/08/2016 CLINICAL DATA:  Shortness of breath and weakness EXAM: CHEST  2 VIEW COMPARISON:  Two days prior FINDINGS: Cardiopericardial enlargement that is similar to priors. There has been pericardiocentesis per EMR. Moderate left pleural effusion tracking laterally. The underlying left lower lobe is opacified, nonspecific between pneumonia and atelectasis. No pneumothorax. IMPRESSION: 1. Unchanged moderate left pleural effusion and opacified left lower lobe. 2. Persistent cardiopericardial enlargement despite pericardiocentesis. Electronically Signed   By: Marnee Spring M.D.   On: 10/08/2016 08:57    Assessment/Plan: Diagnosis: Debility Labs and imaging independently reviewed.  Records reviewed and summated above.  1. Does the need for close, 24 hr/day medical supervision in concert with the patient's rehab needs make it unreasonable for this patient to be served in a less intensive setting? Yes 2. Co-Morbidities requiring supervision/potential complications: cerebral atrophy (monitor), MR with bouts of agitation, morbid obesity (Body mass index is 46.16 kg/m., diet and exercise education, encourage weight loss to increase endurance and promote overall health, Graves disease (cont meds), acute renal failure (avoid nephrotoxic meds), drug induced lupus (monitor, cont meds), Tachycardia (monitor in accordance with pain and increasing activity), hypokalemia (continue to monitor and replete as necessary), ABLA (transfuse if necessary to ensure appropriate perfusion for  increased activity tolerance) 3. Due to safety, disease management, medication administration and patient education, does the patient require 24 hr/day rehab nursing? Yes 4. Does the patient require coordinated care of a physician, rehab nurse, PT (1-2 hrs/day, 5 days/week), OT (1-2 hrs/day, 5 days/week) and SLP (1-2 hrs/day, 5 days/week) to address physical and functional deficits in the context of the above medical diagnosis(es)? Yes Addressing deficits in the following areas: balance, endurance, locomotion, strength, transferring, bathing, dressing, toileting and psychosocial support 5. Can the patient actively participate in an intensive therapy program of at least 3 hrs of therapy per day at least 5 days per week? Potentially 6. The potential for patient to make measurable gains while on inpatient rehab is good 7. Anticipated functional outcomes upon discharge from inpatient rehab are supervision  with PT, supervision with OT, supervision with SLP. 8. Estimated rehab length of  stay to reach the above functional goals is: 7-11 days. 9. Anticipated D/C setting: Home 10. Anticipated post D/C treatments: HH therapy and Home excercise program 11. Overall Rehab/Functional Prognosis: good  RECOMMENDATIONS: This patient's condition is appropriate for continued rehabilitative care in the following setting: Will cont to follow. Pt currently unable to tolerate 3 hours therapy/day and may progress quickly.  Patient has agreed to participate in recommended program. Potentially Note that insurance prior authorization may be required for reimbursement for recommended care.  Comment: Rehab Admissions Coordinator to follow up.  Maryla Morrow, MD, [address], [place] 10/08/2016

## 2016-10-08 NOTE — Progress Notes (Signed)
PT Cancellation Note  Patient Details Name: Baxter FlatteryOmaure Bakke MRN: [id number] DOB: June 04, 1995   Cancelled Treatment:    Reason Eval/Treat Not Completed: Fatigue/lethargy limiting ability to participate. Pt sleeping on arrival, difficult to arouse. Boyfriend present in room and reports pt did not sleep well last night. Requesting PT come back at another time.   Ilda FoilGarrow, Tzivia Oneil Rene 10/08/2016, 10:16 AM

## 2016-10-08 NOTE — Progress Notes (Addendum)
PROGRESS NOTE    Erika Boyer  ZOX:[id number] DOB: 04-02-1996 DOA: 10/03/2016 PCP: Loletta Specter, PA-C  Brief Narrative:21 year old with history of Graves' disease on methimazole, cerebral atrophy. Brought to the ED by her mother with 3 days of worsening dyspnea. Found to have temperature of 103, hypoxia and a chest x-ray with large left pleural effusion and possible pneumonia. Admitted to ICU, intubated, Rx with Abx, CT chest 5/16 > large circumferential pericardial effusion, large left pleural effusion with compressive atelectasias and ? L>R shift of mediastinum, small right pleural effusion. ECHO with large pericardial effusion as well/ with early tamponade. 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 also possible Cultures negative, started on Prednisone, extubated Transferred from [facility] to [facility] 5/20   Assessment & Plan:  Acute Resp failure -s/p VDRF, extubated 5/18 -see below, due to large pleural and pericardial effusion with early tamponade -improving -stopped IVF, appears volume overloaded, continue PO lasix today  Large L pleural effusion -with L compressive atelectasis vs Pneumonia, clinically doubt pneumonia at this time -s/p L Thoracentesis 5/17; 850 ml of cloudy pleural fluid was obtained -FLuid analysis is exudative, cultures negative -Abnormal Serologies 10/04/16:ANA 1:1280 Homogenous, 1:160 speckled, dsDNA > 300 Total complement 17, Smith ab >8-hence concerning for SLE causing all of the above Cultures negative, started on Prednisone now -I called and requested input from Dr.Shaili Deveshwar  Rheum, await call back -Methimazole held -repeat CXR today-with moderate to large R effusion, I think she needs another thoracentesis will ask Pulm or IR  Large Pericardial effusion -with early tamponade on ECHO -appreciate Cards input -underwent Pericardiocentesis 5/17 per Dr.Ganji drained 870 mL of serosanguineous fluid, pericardial effusion completely resolved, started on Colchicine -suspect this is related to possible SLE/drug induced lupus as well -repeat ECHO tomorrow  Anemia -due to acute illness/hemodilution and possibly low grade hemolysis from SLE -baseline Hb 9.5-10 -schistocytes noted in smear but Bili normal -haptoglobin pending -anemia panel s/o IRon defi too  AKI -improving, stop IVF, renal US unremarkable  ? 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  Cerebral atrophy -stable  DVT prophylaxis:lovenox Code Status: Full Code Family Communication: boyfriend at bedside, called and updated mother via telephone 5/20 Disposition Plan: Home pending improvement 3-4days  Consultants:   PCCM  Cards   Procedures: Thoracentesis 5/17; 850 ml of cloudy pleural fluid was obtained  Pericardiocentesis 5/17 per Dr.Ganji drained 870 mL of serosanguineous fluid  Antimicrobials:   ROcephin/zithromax   Subjective: Still dyspneic with minimal activity  Objective: Vitals:   10/07/16 1648 10/07/16 2302 10/08/16 0607 10/08/16 0645  BP: 134/86 137/70 (!) 142/100   Pulse: (!) 111 (!) 107 (!) 112   Resp: 20 18 18    Temp: 98.9 F (37.2 C) 98.3 F (36.8 C) 99.8 F (37.7 C)   TempSrc:  Oral Oral   SpO2: 97% 99% 95%   Weight:    125.8 kg (277 lb 6.4 oz)  Height:        Intake/Output Summary (Last 24 hours) at 10/08/16 8119 Last data filed at 10/07/16 2000  Gross per 24 hour  Intake              370 ml   Output                0 ml  Net              370 ml   Filed Weights   10/06/16 1142 10/07/16 0618 10/08/16 0645  Weight: 121.5 kg (267 lb 14.4 oz) 125.8 kg (277 lb 4.8 oz) 125.8 kg (277 lb 6.4 oz)    Examination:  General exam: AAOx3, obese, chronically ill appearing Respiratory system: diminished BS on Left Cardiovascular system: S1 & S2 heard, RRR. 1plus  pedal edema. Gastrointestinal system: Abdomen is obese, distended, soft and nontender.Normal bowel sounds heard. Central nervous system: Alert and oriented. Slow congition Extremities: Symmetric 5 x 5 power. Skin: No rashes, lesions or ulcers Psychiatry: Judgement and insight appear normal. Mood & affect appropriate.     Data Reviewed:   CBC:  Recent Labs Lab 10/03/16 1408 10/04/16 0700 10/05/16 0936 10/06/16 0417 10/06/16 1843 10/07/16 0523 10/08/16 0543  WBC 13.5* 9.1 10.5 10.0  --  7.1 8.4  NEUTROABS 11.0*  --   --   --   --   --   --   HGB 9.5* 8.0* 7.1* 7.5*  --  7.5* 7.3*  HCT 29.0* 25.2* 22.2* 23.7*  --  23.8* 23.3*  MCV 70.2* 69.8* 69.2* 68.7*  --  69.4* 69.8*  PLT 241 189 225 255 291 256 233   Basic Metabolic Panel:  Recent Labs Lab 10/04/16 1427 10/04/16 1944 10/05/16 0329 10/05/16 0936 10/05/16 1556 10/06/16 0417 10/07/16 0523 10/08/16 0543  NA  --  135  --  137  --  142 141 139  K  --  4.1  --  4.0  --  4.0 3.0* 3.3*  CL  --  109  --  112*  --  115* 115* 115*  CO2  --  18*  --  19*  --  20* 20* 20*  GLUCOSE  --  125*  --  154*  --  103* 87 100*  BUN  --  30*  --  29*  --  28* 25* 18  CREATININE  --  2.10*  --  1.75*  --  1.25* 1.07* 0.88  CALCIUM  --  7.6*  --  7.5*  --  7.9* 7.9* 8.0*  MG 2.0 2.0 2.1  --  2.2 2.0  --   --   PHOS 4.2 3.9  3.9 4.1  --  2.7 3.3  --   --    GFR: Estimated Creatinine Clearance: 136 mL/min (by C-G formula based on SCr of 0.88 mg/dL). Liver Function Tests:  Recent Labs Lab 10/03/16 1408 10/04/16 1427 10/04/16 1944 10/05/16 0936  AST 34  --   --   14*  ALT 17  --   --  15  ALKPHOS 41  --   --  34*  BILITOT 1.4*  --   --  0.3  PROT 7.1 5.8*  --  5.7*  ALBUMIN 2.5*  --  2.2* 1.9*   No results for input(s): LIPASE, AMYLASE in the last 168 hours. No results for input(s): AMMONIA in the last 168 hours. Coagulation Profile:  Recent Labs Lab 10/03/16 1928 10/06/16 1843  INR 1.68 1.38   Cardiac Enzymes:  Recent Labs Lab 10/03/16 1928 10/03/16 2301 10/04/16 0700  TROPONINI 0.05* 0.03* <0.03   BNP (last 3 results) No results for input(s): PROBNP  in the last 8760 hours. HbA1C: No results for input(s): HGBA1C in the last 72 hours. CBG:  Recent Labs Lab 10/03/16 2347 10/04/16 1548 10/05/16 0731 10/05/16 1131 10/05/16 1926  GLUCAP 175* 105* 121* 124* 75   Lipid Profile: No results for input(s): CHOL, HDL, LDLCALC, TRIG, CHOLHDL, LDLDIRECT in the last 72 hours. Thyroid Function Tests: No results for input(s): TSH, T4TOTAL, FREET4, T3FREE, THYROIDAB in the last 72 hours. Anemia Panel: No results for input(s): VITAMINB12, FOLATE, FERRITIN, TIBC, IRON, RETICCTPCT in the last 72 hours. Urine analysis:    Component Value Date/Time   COLORURINE AMBER (A) 10/03/2016 2034   APPEARANCEUR HAZY (A) 10/03/2016 2034   LABSPEC 1.024 10/03/2016 2034   PHURINE 5.0 10/03/2016 2034   GLUCOSEU NEGATIVE 10/03/2016 2034   HGBUR SMALL (A) 10/03/2016 2034   BILIRUBINUR NEGATIVE 10/03/2016 2034   KETONESUR 5 (A) 10/03/2016 2034   PROTEINUR 100 (A) 10/03/2016 2034   NITRITE NEGATIVE 10/03/2016 2034   LEUKOCYTESUR NEGATIVE 10/03/2016 2034   Sepsis Labs: @LABRCNTIP (procalcitonin:4,lacticidven:4)  ) Recent Results (from the past 240 hour(s))  Blood Culture (routine x 2)     Status: None   Collection Time: 10/03/16  1:39 PM  Result Value Ref Range Status   Specimen Description BLOOD RIGHT ANTECUBITAL  Final   Special Requests   Final    BOTTLES DRAWN AEROBIC AND ANAEROBIC Blood Culture adequate volume   Culture NO GROWTH 5 DAYS   Final   Report Status 10/08/2016 FINAL  Final  Blood Culture (routine x 2)     Status: None   Collection Time: 10/03/16  2:40 PM  Result Value Ref Range Status   Specimen Description BLOOD RIGHT HAND  Final   Special Requests   Final    BOTTLES DRAWN AEROBIC AND ANAEROBIC Blood Culture adequate volume   Culture NO GROWTH 5 DAYS  Final   Report Status 10/08/2016 FINAL  Final  Culture, respiratory (NON-Expectorated)     Status: None   Collection Time: 10/03/16  5:55 PM  Result Value Ref Range Status   Specimen Description TRACHEAL ASPIRATE  Final   Special Requests NONE  Final   Gram Stain   Final    MODERATE WBC PRESENT,BOTH PMN AND MONONUCLEAR FEW GRAM POSITIVE COCCI RARE GRAM POSITIVE RODS RARE GRAM NEGATIVE RODS    Culture Consistent with normal respiratory flora.  Final   Report Status 10/06/2016 FINAL  Final  MRSA PCR Screening     Status: None   Collection Time: 10/03/16  7:44 PM  Result Value Ref Range Status   MRSA by PCR NEGATIVE NEGATIVE Final    Comment:        The GeneXpert MRSA Assay (FDA approved for NASAL specimens only), is one component of a comprehensive MRSA colonization surveillance program. It is not intended to diagnose MRSA infection nor to guide or monitor treatment for MRSA infections.   Culture, Urine     Status: None   Collection Time: 10/03/16  8:34 PM  Result Value Ref Range Status   Specimen Description URINE, RANDOM  Final   Special Requests NONE  Final   Culture NO GROWTH  Final   Report Status 10/05/2016 FINAL  Final  Respiratory Panel by PCR     Status: None   Collection Time: 10/03/16  8:34 PM  Result Value Ref Range Status   Adenovirus NOT DETECTED NOT DETECTED Final   Coronavirus 229E NOT DETECTED NOT DETECTED Final   Coronavirus HKU1 NOT DETECTED NOT DETECTED Final  Coronavirus NL63 NOT DETECTED NOT DETECTED Final   Coronavirus OC43 NOT DETECTED NOT DETECTED Final   Metapneumovirus NOT DETECTED NOT DETECTED Final   Rhinovirus  / Enterovirus NOT DETECTED NOT DETECTED Final   Influenza A NOT DETECTED NOT DETECTED Final   Influenza B NOT DETECTED NOT DETECTED Final   Parainfluenza Virus 1 NOT DETECTED NOT DETECTED Final   Parainfluenza Virus 2 NOT DETECTED NOT DETECTED Final   Parainfluenza Virus 3 NOT DETECTED NOT DETECTED Final   Parainfluenza Virus 4 NOT DETECTED NOT DETECTED Final   Respiratory Syncytial Virus NOT DETECTED NOT DETECTED Final   Bordetella pertussis NOT DETECTED NOT DETECTED Final   Chlamydophila pneumoniae NOT DETECTED NOT DETECTED Final   Mycoplasma pneumoniae NOT DETECTED NOT DETECTED Final  Body fluid culture     Status: None   Collection Time: 10/04/16  2:01 PM  Result Value Ref Range Status   Specimen Description THORACENTESIS  Final   Special Requests Normal  Final   Gram Stain   Final    MODERATE WBC PRESENT,BOTH PMN AND MONONUCLEAR NO ORGANISMS SEEN    Culture NO GROWTH 3 DAYS  Final   Report Status 10/07/2016 FINAL  Final  Culture, body fluid-bottle     Status: None (Preliminary result)   Collection Time: 10/04/16  5:42 PM  Result Value Ref Range Status   Specimen Description FLUID PERICARDIAL  Final   Special Requests BOTTLES DRAWN AEROBIC AND ANAEROBIC 10CC  Final   Culture NO GROWTH 4 DAYS  Final   Report Status PENDING  Incomplete  Gram stain     Status: None   Collection Time: 10/04/16  5:42 PM  Result Value Ref Range Status   Specimen Description FLUID PERICARDIAL  Final   Special Requests NONE  Final   Gram Stain   Final    ABUNDANT WBC PRESENT,BOTH PMN AND MONONUCLEAR NO ORGANISMS SEEN    Report Status 10/04/2016 FINAL  Final         Radiology Studies: Dg Chest 2 View  Result Date: 10/08/2016 CLINICAL DATA:  Shortness of breath and weakness EXAM: CHEST  2 VIEW COMPARISON:  Two days prior FINDINGS: Cardiopericardial enlargement that is similar to priors. There has been pericardiocentesis per EMR. Moderate left pleural effusion tracking laterally. The  underlying left lower lobe is opacified, nonspecific between pneumonia and atelectasis. No pneumothorax. IMPRESSION: 1. Unchanged moderate left pleural effusion and opacified left lower lobe. 2. Persistent cardiopericardial enlargement despite pericardiocentesis. Electronically Signed   By: Marnee SpringJonathon  Watts M.D.   On: 10/08/2016 08:57        Scheduled Meds: . aspirin  81 mg Oral Daily  . azithromycin  500 mg Oral Daily  . Chlorhexidine Gluconate Cloth  6 each Topical Daily  . colchicine  0.6 mg Oral BID  . enoxaparin (LOVENOX) injection  40 mg Subcutaneous Q24H  . furosemide  20 mg Oral Daily  . multivitamin  1 tablet Oral Daily  . predniSONE  40 mg Oral Q breakfast  . sodium chloride flush  10-40 mL Intracatheter Q12H   Continuous Infusions: . cefTRIAXone (ROCEPHIN)  IV Stopped (10/08/16 0533)     LOS: 5 days    Time spent: 9235    Zannie CovePreetha Kalaysia Demonbreun, MD [facility] Pager [phone]  If 7PM-7AM, please contact night-coverage [email or web address] Password [email or web address] 10/08/2016, 9:38 AM

## 2016-10-08 NOTE — Care Management Important Message (Signed)
Important Message  Patient Details  Name: Erika FlatteryOmaure Landau MRN: [id number] Date of Birth: December 22, 1995   Medicare Important Message Given:  Yes    Taris Galindo Abena 10/08/2016, 1:06 PM

## 2016-10-08 NOTE — Progress Notes (Signed)
Nutrition Follow-up  DOCUMENTATION CODES:   Morbid obesity  INTERVENTION:   -Continue with regular diet -Continue MVI daily  NUTRITION DIAGNOSIS:   Inadequate oral intake related to inability to eat as evidenced by NPO status.  Resolved  GOAL:   Patient will meet greater than or equal to 90% of their needs  Met   MONITOR:   PO intake, Labs, Skin, I & O's  REASON FOR ASSESSMENT:   Consult Enteral/tube feeding initiation and management  ASSESSMENT:   21 y.o. 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  5/17- s/p pericardiocentesis, 870 ml fluid removed 5/18- extubated 5/19- transferred from ICU to medical floor  Spoke with pt, who reports good appetite up until 1 week PTA, but has been returning. She estimates that she typically consumes 2-3 meals per day. She consumed 100% of breakfast per her report, but only consumed 50% of meals yesterday, as she did not receive meal items that she liked. Reviewed menu options with pt and discussed alternate items pt could choose. Pt reports that she has been satisfied with her food selections today, as her mother assisted her with meal selections.   Discussed importance of good meal completion to assist with healing.   Labs reviewed: K: 3.3.   Diet Order:  Diet regular Room service appropriate? Yes; Fluid consistency: Thin  Skin:  Reviewed, no issues  Last BM:  10/07/16  Height:   Ht Readings from Last 1 Encounters:  10/06/16 _0  (1.651 m)    Weight:   Wt Readings from Last 1 Encounters:  10/08/16 277 lb 6.4 oz (125.8 kg)    Ideal Body Weight:  56.8 kg  BMI:  Body mass index is 46.16 kg/m.  Estimated Nutritional Needs:   Kcal:  1600-1800  Protein:  85-100 grams  Fluid:  1.6-1.8 L  EDUCATION NEEDS:   Education needs addressed  Tu Bayle A. Jimmye Norman, RD, LDN,  CDE Pager: [phone] After hours Pager: [phone]

## 2016-10-09 ENCOUNTER — Ambulatory Visit: Payer: Self-pay | Admitting: Neurology

## 2016-10-09 LAB — COMPREHENSIVE METABOLIC PANEL
ALBUMIN: 2 g/dL — AB (ref 3.5–5.0)
ALK PHOS: 34 U/L — AB (ref 38–126)
ALT: 19 U/L (ref 14–54)
AST: 19 U/L (ref 15–41)
Anion gap: 8 (ref 5–15)
BUN: 15 mg/dL (ref 6–20)
CALCIUM: 8.1 mg/dL — AB (ref 8.9–10.3)
CHLORIDE: 113 mmol/L — AB (ref 101–111)
CO2: 20 mmol/L — AB (ref 22–32)
CREATININE: 0.86 mg/dL (ref 0.44–1.00)
GFR calc non Af Amer: >60 mL/min (ref >60)
GLUCOSE: 87 mg/dL (ref 65–99)
Potassium: 3.1 mmol/L — ABNORMAL LOW (ref 3.5–5.1)
SODIUM: 141 mmol/L (ref 135–145)
Total Bilirubin: 0.6 mg/dL (ref 0.3–1.2)
Total Protein: 5.5 g/dL — ABNORMAL LOW (ref 6.5–8.1)

## 2016-10-09 LAB — CULTURE, BODY FLUID W GRAM STAIN -BOTTLE: Culture: NO GROWTH

## 2016-10-09 LAB — CULTURE, BODY FLUID-BOTTLE

## 2016-10-09 LAB — HISTONE ANTIBODIES, IGG, BLOOD: DNA-Histone: 9.7 Units — ABNORMAL HIGH (ref 0.0–0.9)

## 2016-10-09 MED ORDER — PANTOPRAZOLE SODIUM 40 MG PO TBEC
40.0000 mg | DELAYED_RELEASE_TABLET | Freq: Every day | ORAL | Status: DC
  Administered 2016-10-09 – 2016-10-12 (×4): 40 mg via ORAL
  Filled 2016-10-09 (×4): qty 1

## 2016-10-09 NOTE — Progress Notes (Signed)
Physical Therapy Treatment Patient Details Name: Erika Boyer MRN: [id number] DOB: [birth year] Today's Date: 10/09/2016    History of Present Illness 21 year old with history of Graves' disease on methimazole, cerebral atrophy. Brought to the ED by her mother with 3 days of worsening dyspnea. Found to have temperature of 103 and a chest x-ray with large left pleural effusion, likely from severe community acquired pneumonia or viral illness.    PT Comments    Patient is making progress toward mobility goals and tolerated increased gait distance this session. Pt continues to be a good candidate for CIR. Continue to progress as tolerated.    Follow Up Recommendations  CIR     Equipment Recommendations  None recommended by PT    Recommendations for Other Services Rehab consult     Precautions / Restrictions Precautions Precautions: Fall    Mobility  Bed Mobility               General bed mobility comments: pt OOB in chair upon arrival  Transfers Overall transfer level: Needs assistance Equipment used: Rolling walker (2 wheeled) Transfers: Sit to/from Stand Sit to Stand: Min assist         General transfer comment: pt able to power up into standing without assist however required steadying assist upon stand  Ambulation/Gait Ambulation/Gait assistance: Min assist;Mod assist;+2 safety/equipment (+2 for chair follow) Ambulation Distance (Feet): 65 Feet Assistive device: Rolling walker (2 wheeled) Gait Pattern/deviations: Step-through pattern;Wide base of support;Decreased stride length;Staggering left;Staggering right     General Gait Details: pt with bilat LE weakness and with almost ataxic gait pattern this session with intermittent knee buckling; pt with difficulty turning; multimodal cues for posture and vc for proximity of RW; pt demo'd improvement in gait pattern when rest break taken to reposition in RW and "regroup"    Stairs            Wheelchair  Mobility    Modified Rankin (Stroke Patients Only)       Balance Overall balance assessment: Needs assistance Sitting-balance support: No upper extremity supported;Feet supported Sitting balance-Leahy Scale: Good     Standing balance support: Bilateral upper extremity supported;During functional activity Standing balance-Leahy Scale: Poor                              Cognition Arousal/Alertness: Awake/alert Behavior During Therapy: WFL for tasks assessed/performed Overall Cognitive Status: Within Functional Limits for tasks assessed                                        Exercises      General Comments General comments (skin integrity, edema, etc.): bilat LE edema      Pertinent Vitals/Pain Pain Assessment: No/denies pain    Home Living                      Prior Function            PT Goals (current goals can now be found in the care plan section) Progress towards PT goals: Progressing toward goals    Frequency    Min 4X/week      PT Plan Current plan remains appropriate    Co-evaluation              AM-PAC PT "6 Clicks" Daily Activity  Outcome Measure  Difficulty turning over  in bed (including adjusting bedclothes, sheets and blankets)?: A Little Difficulty moving from lying on back to sitting on the side of the bed? : Total Difficulty sitting down on and standing up from a chair with arms (e.g., wheelchair, bedside commode, etc,.)?: Total Help needed moving to and from a bed to chair (including a wheelchair)?: A Little Help needed walking in hospital room?: A Lot Help needed climbing 3-5 steps with a railing? : A Lot 6 Click Score: 12    End of Session Equipment Utilized During Treatment: Gait belt Activity Tolerance: Patient tolerated treatment well Patient left: in chair;with call bell/phone within reach Nurse Communication: Mobility status PT Visit Diagnosis: Muscle weakness (generalized)  (M62.81);Other abnormalities of gait and mobility (R26.89)     Time: 1610-9604 PT Time Calculation (min) (ACUTE ONLY): 16 min  Charges:  $Gait Training: 8-22 mins                    G Codes:       Erline Levine, PTA Pager: [phone]     Carolynne Edouard 10/09/2016, 4:41 PM

## 2016-10-09 NOTE — Progress Notes (Addendum)
PROGRESS NOTE    Erika Boyer  ZOX:[id number] DOB: 12-Dec-1995 DOA: 10/03/2016 PCP: Loletta Specter, PA-C  Brief Narrative:21 year old with history of Graves' disease on methimazole, cerebral atrophy. Brought to the ED by her mother with 3 days of worsening dyspnea. Found to be febrile, hypoxia and a chest x-ray with large left pleural effusion and possible pneumonia. Admitted to ICU, intubated, Rx with Abx, CT chest 5/16 > large circumferential pericardial effusion, large left pleural effusion with compressive atelectasias and ? L>R shift of mediastinum, small right pleural effusion. ECHO with large pericardial effusion as well/ with early tamponade. 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 vs Drug induced Lupus from Methimazole -Cultures negative, started on Prednisone, extubated Transferred from PCCM to [facility] 5/20   Assessment & Plan:  Acute Resp failure -s/p VDRF, extubated 5/18 -see below, due to large pleural and pericardial effusion with early tamponade -improving, weaned off O2 -stopped IVF, appears volume overloaded, continue PO lasix today -CIR evaluating for Rehab  Large L pleural effusion -with L compressive atelectasis vs Pneumonia, clinically doubt pneumonia at this time -s/p L Thoracentesis 5/17; 850 ml of cloudy pleural fluid was obtained -FLuid analysis is exudative, cultures negative -due to SLE vs Drug induced Lupus, see below -repeat CXR with moderate L effusion, PCCM consulted, recommended to continue prednisone for now and monitor with serial CXR in 1 week, has FU with Dr.Mannam in office 5/29  Large Pericardial  effusion -with early tamponade on ECHO -appreciate Cards input -underwent Pericardiocentesis 5/17 per Dr.Ganji drained 870 mL of serosanguineous fluid, pericardial effusion completely resolved, started on Colchicine -due to suspected SLE as well -repeat ECHO today  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 -Abnormal Serologies 10/04/16: ANA 1:1280 Homogenous, 1:160 speckled, dsDNA > 300 Total complement 17, Anti Smith ab >8-hence concerning for SLE causing all of the above -CCP antibody negative at 11, ANCA negative Cultures negative, started on Prednisone now 60mg  to 40mg  daily now -Methimazole held -Rheum referral made, called and discussed patient with Milagros Loll and faxed patients notes from today, they will call pt with FU, she will continue Prednisone 40mg  daily until RHeum FU  Anemia -due to acute illness/hemodilution and possibly low grade hemolysis from SLE -baseline Hb 9.5-10 -schistocytes noted in smear but Bili normal, haptoglobin normal, LDH normal-all this doesn't support hemolysis -anemia panel s/o IRon defi too -Hb improving, monitor  AKI -improving, stop IVF, renal US unremarkable  ? 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  Cerebral atrophy -stable  DVT prophylaxis:lovenox Code Status: Full Code Family Communication: boyfriend at bedside, called and updated mother via telephone 5/20 Disposition Plan: hopefully CIR in few days  Consultants:   PCCM  Cards   Procedures: Thoracentesis 5/17; 850 ml of cloudy pleural fluid was obtained  Pericardiocentesis 5/17 per Dr.Ganji drained 870 mL of serosanguineous fluid  Antimicrobials:   ROcephin/zithromax completed 5/21   Subjective: Still dyspneic with minimal activity, but overall breatjing better  Objective: Vitals:    10/08/16 1409 10/08/16 2123 10/09/16 0602 10/09/16 0839  BP: 124/70 122/81 (!) 138/92 135/90  Pulse: 99 93 (!)  108 (!) 102  Resp: 18 20 18    Temp: 98.6 F (37 C) 97.7 F (36.5 C) 98 F (36.7 C)   TempSrc: Oral Oral Oral   SpO2: 96% 99% 100%   Weight:   125.9 kg (277 lb 9 oz)   Height:        Intake/Output Summary (Last 24 hours) at 10/09/16 1047 Last data filed at 10/09/16 1023  Gross per 24 hour  Intake             1030 ml  Output              250 ml  Net              780 ml   Filed Weights   10/07/16 0618 10/08/16 0645 10/09/16 0602  Weight: 125.8 kg (277 lb 4.8 oz) 125.8 kg (277 lb 6.4 oz) 125.9 kg (277 lb 9 oz)    Examination:  General exam: AAOx3, obese female, laying in bed Respiratory system: decraesed BS on left Cardiovascular system: S1S2/RRR, distant heart sounds Gastrointestinal system: Abdomen is obese, distended, soft and nontender.Normal bowel sounds heard. Central nervous system: Alert and oriented. Slow congition Extremities: Symmetric 5 x 5 power. Skin: No rashes, lesions or ulcers, old excoriations noted on legs Psychiatry: flat affect, Judgement and insight appear normal..     Data Reviewed:   CBC:  Recent Labs Lab 10/03/16 1408  10/05/16 0936 10/06/16 0417 10/06/16 1843 10/07/16 0523 10/08/16 0543 10/08/16 1457  WBC 13.5*  < > 10.5 10.0  --  7.1 8.4 8.5  NEUTROABS 11.0*  --   --   --   --   --   --  7.5  HGB 9.5*  < > 7.1* 7.5*  --  7.5* 7.3* 8.1*  HCT 29.0*  < > 22.2* 23.7*  --  23.8* 23.3* 26.1*  MCV 70.2*  < > 69.2* 68.7*  --  69.4* 69.8* 70.2*  PLT 241  < > 225 255 291 256 233 291  < > = values in this interval not displayed. Basic Metabolic Panel:  Recent Labs Lab 10/04/16 1427 10/04/16 1944 10/05/16 0329 10/05/16 0936 10/05/16 1556 10/06/16 0417 10/07/16 0523 10/08/16 0543 10/09/16 0655  NA  --  135  --  137  --  142 141 139 141  K  --  4.1  --  4.0  --  4.0 3.0* 3.3* 3.1*  CL  --  109  --  112*  --  115* 115* 115*  113*  CO2  --  18*  --  19*  --  20* 20* 20* 20*  GLUCOSE  --  125*  --  154*  --  103* 87 100* 87  BUN  --  30*  --  29*  --  28* 25* 18 15  CREATININE  --  2.10*  --  1.75*  --  1.25* 1.07* 0.88 0.86  CALCIUM  --  7.6*  --  7.5*  --  7.9* 7.9* 8.0* 8.1*  MG 2.0 2.0 2.1  --  2.2 2.0  --   --   --   PHOS 4.2 3.9  3.9 4.1  --  2.7 3.3  --   --   --    GFR: Estimated Creatinine Clearance: 139.4 mL/min (by C-G formula based on SCr of 0.86 mg/dL). Liver Function Tests:  Recent Labs Lab 10/03/16 1408 10/04/16 1427 10/04/16 1944 10/05/16 0936 10/09/16 0655  AST 34  --   --  14* 19  ALT 17  --   --  15 19  ALKPHOS 41  --   --  34* 34*  BILITOT 1.4*  --   --  0.3 0.6  PROT 7.1 5.8*  --  5.7* 5.5*  ALBUMIN 2.5*  --  2.2* 1.9* 2.0*   No results for input(s): LIPASE, AMYLASE in the last 168 hours. No results for input(s): AMMONIA in the last 168 hours. Coagulation Profile:  Recent Labs Lab 10/03/16 1928 10/06/16 1843  INR 1.68 1.38   Cardiac Enzymes:  Recent Labs Lab 10/03/16 1928 10/03/16 2301 10/04/16 0700  TROPONINI 0.05* 0.03* <0.03   BNP (last 3 results) No results for input(s): PROBNP in the last 8760 hours. HbA1C: No results for input(s): HGBA1C in the last 72 hours. CBG:  Recent Labs Lab 10/03/16 2347 10/04/16 1548 10/05/16 0731 10/05/16 1131 10/05/16 1926  GLUCAP 175* 105* 121* 124* 75   Lipid Profile: No results for input(s): CHOL, HDL, LDLCALC, TRIG, CHOLHDL, LDLDIRECT in the last 72 hours. Thyroid Function Tests: No results for input(s): TSH, T4TOTAL, FREET4, T3FREE, THYROIDAB in the last 72 hours. Anemia Panel: No results for input(s): VITAMINB12, FOLATE, FERRITIN, TIBC, IRON, RETICCTPCT in the last 72 hours. Urine analysis:    Component Value Date/Time   COLORURINE AMBER (A) 10/03/2016 2034   APPEARANCEUR HAZY (A) 10/03/2016 2034   LABSPEC 1.024 10/03/2016 2034   PHURINE 5.0 10/03/2016 2034   GLUCOSEU NEGATIVE 10/03/2016 2034   HGBUR  SMALL (A) 10/03/2016 2034   BILIRUBINUR NEGATIVE 10/03/2016 2034   KETONESUR 5 (A) 10/03/2016 2034   PROTEINUR 100 (A) 10/03/2016 2034   NITRITE NEGATIVE 10/03/2016 2034   LEUKOCYTESUR NEGATIVE 10/03/2016 2034   Sepsis Labs: @LABRCNTIP (procalcitonin:4,lacticidven:4)  ) Recent Results (from the past 240 hour(s))  Blood Culture (routine x 2)     Status: None   Collection Time: 10/03/16  1:39 PM  Result Value Ref Range Status   Specimen Description BLOOD RIGHT ANTECUBITAL  Final   Special Requests   Final    BOTTLES DRAWN AEROBIC AND ANAEROBIC Blood Culture adequate volume   Culture NO GROWTH 5 DAYS  Final   Report Status 10/08/2016 FINAL  Final  Blood Culture (routine x 2)     Status: None   Collection Time: 10/03/16  2:40 PM  Result Value Ref Range Status   Specimen Description BLOOD RIGHT HAND  Final   Special Requests   Final    BOTTLES DRAWN AEROBIC AND ANAEROBIC Blood Culture adequate volume   Culture NO GROWTH 5 DAYS  Final   Report Status 10/08/2016 FINAL  Final  Culture, respiratory (NON-Expectorated)     Status: None   Collection Time: 10/03/16  5:55 PM  Result Value Ref Range Status   Specimen Description TRACHEAL ASPIRATE  Final   Special Requests NONE  Final   Gram Stain   Final    MODERATE WBC PRESENT,BOTH PMN AND MONONUCLEAR FEW GRAM POSITIVE COCCI RARE GRAM POSITIVE RODS RARE GRAM NEGATIVE RODS    Culture Consistent with normal respiratory flora.  Final   Report Status 10/06/2016 FINAL  Final  MRSA PCR Screening     Status: None   Collection Time: 10/03/16  7:44 PM  Result Value Ref Range Status   MRSA by PCR NEGATIVE NEGATIVE Final    Comment:        The GeneXpert MRSA Assay (FDA approved for NASAL specimens only), is one component of a comprehensive MRSA colonization surveillance program. It is not intended  to diagnose MRSA infection nor to guide or monitor treatment for MRSA infections.   Culture, Urine     Status: None   Collection Time:  10/03/16  8:34 PM  Result Value Ref Range Status   Specimen Description URINE, RANDOM  Final   Special Requests NONE  Final   Culture NO GROWTH  Final   Report Status 10/05/2016 FINAL  Final  Respiratory Panel by PCR     Status: None   Collection Time: 10/03/16  8:34 PM  Result Value Ref Range Status   Adenovirus NOT DETECTED NOT DETECTED Final   Coronavirus 229E NOT DETECTED NOT DETECTED Final   Coronavirus HKU1 NOT DETECTED NOT DETECTED Final   Coronavirus NL63 NOT DETECTED NOT DETECTED Final   Coronavirus OC43 NOT DETECTED NOT DETECTED Final   Metapneumovirus NOT DETECTED NOT DETECTED Final   Rhinovirus / Enterovirus NOT DETECTED NOT DETECTED Final   Influenza A NOT DETECTED NOT DETECTED Final   Influenza B NOT DETECTED NOT DETECTED Final   Parainfluenza Virus 1 NOT DETECTED NOT DETECTED Final   Parainfluenza Virus 2 NOT DETECTED NOT DETECTED Final   Parainfluenza Virus 3 NOT DETECTED NOT DETECTED Final   Parainfluenza Virus 4 NOT DETECTED NOT DETECTED Final   Respiratory Syncytial Virus NOT DETECTED NOT DETECTED Final   Bordetella pertussis NOT DETECTED NOT DETECTED Final   Chlamydophila pneumoniae NOT DETECTED NOT DETECTED Final   Mycoplasma pneumoniae NOT DETECTED NOT DETECTED Final  Body fluid culture     Status: None   Collection Time: 10/04/16  2:01 PM  Result Value Ref Range Status   Specimen Description THORACENTESIS  Final   Special Requests Normal  Final   Gram Stain   Final    MODERATE WBC PRESENT,BOTH PMN AND MONONUCLEAR NO ORGANISMS SEEN    Culture NO GROWTH 3 DAYS  Final   Report Status 10/07/2016 FINAL  Final  Culture, body fluid-bottle     Status: None (Preliminary result)   Collection Time: 10/04/16  5:42 PM  Result Value Ref Range Status   Specimen Description FLUID PERICARDIAL  Final   Special Requests BOTTLES DRAWN AEROBIC AND ANAEROBIC 10CC  Final   Culture NO GROWTH 4 DAYS  Final   Report Status PENDING  Incomplete  Gram stain     Status: None    Collection Time: 10/04/16  5:42 PM  Result Value Ref Range Status   Specimen Description FLUID PERICARDIAL  Final   Special Requests NONE  Final   Gram Stain   Final    ABUNDANT WBC PRESENT,BOTH PMN AND MONONUCLEAR NO ORGANISMS SEEN    Report Status 10/04/2016 FINAL  Final         Radiology Studies: Dg Chest 2 View  Result Date: 10/08/2016 CLINICAL DATA:  Shortness of breath and weakness EXAM: CHEST  2 VIEW COMPARISON:  Two days prior FINDINGS: Cardiopericardial enlargement that is similar to priors. There has been pericardiocentesis per EMR. Moderate left pleural effusion tracking laterally. The underlying left lower lobe is opacified, nonspecific between pneumonia and atelectasis. No pneumothorax. IMPRESSION: 1. Unchanged moderate left pleural effusion and opacified left lower lobe. 2. Persistent cardiopericardial enlargement despite pericardiocentesis. Electronically Signed   By: Marnee SpringJonathon  Watts M.D.   On: 10/08/2016 08:57        Scheduled Meds: . aspirin  81 mg Oral Daily  . Chlorhexidine Gluconate Cloth  6 each Topical Daily  . colchicine  0.6 mg Oral BID  . enoxaparin (LOVENOX) injection  40 mg Subcutaneous Q24H  .  furosemide  20 mg Oral Daily  . metoprolol tartrate  25 mg Oral BID  . multivitamin  1 tablet Oral Daily  . predniSONE  40 mg Oral Q breakfast  . sodium chloride flush  10-40 mL Intracatheter Q12H   Continuous Infusions:    LOS: 6 days    Time spent:    Zannie Cove, MD [facility] Pager [phone]  If 7PM-7AM, please contact night-coverage [email or web address] Password [email or web address] 10/09/2016, 10:47 AM

## 2016-10-09 NOTE — Progress Notes (Signed)
PULMONARY / CRITICAL CARE MEDICINE   Name: Erika Boyer MRN: [id number] DOB: Sep 24, 1995    ADMISSION DATE:  10/03/2016 CONSULTATION DATE:  10/03/16  REFERRING MD:  Teresa Coombs, MD  CHIEF COMPLAINT:  Respiratory distress  HISTORY OF PRESENT ILLNESS:   21 year old with history of Graves' disease on methimazole, cerebral atrophy. Brought to the ED by her mother with 3 days of worsening dyspnea. Found to have temperature of 103 and a chest x-ray with large left pleural effusion, likely from severe community acquired pneumonia or viral illness. Also complains of increasing leg swelling for the past 2 weeks with redness, 1 episode of vomiting today. This was thought to be allergic reaction she was given 0.2 mg epinephrine, Solu-Medrol, albuterol and Atrovent by EMS. She continues to have significant respiratory distress and was placed on BiPAP and PCCM called for consultation. Noted to have elevated lactic acid, temperature of 103 with elevated WBC count.  She has a history of Graves' disease and cerebral atrophy. She moved a few weeks ago from [place], [place] and is establishing care here. There are no records in the system review. As per her mother she had a viral illness at age of 61 which caused cerebral atrophy. She has mental retardation, periods of agitation secondary to this and she has been seen by multiple neurologists prior to her move to [place].   SUBJECTIVE: Ambulating to BR with assistance   VITAL SIGNS: BP 135/90   Pulse (!) 102   Temp 98 F (36.7 C) (Oral)   Resp 18   Ht 5\' 5"  (1.651 m)   Wt 277 lb 9 oz (125.9 kg)   LMP 10/08/2016   SpO2 100%   BMI 46.19 kg/m   Room air   Intake/Output Summary (Last 24 hours) at 10/09/16 1118 Last data filed at 10/09/16 1023  Gross per 24 hour  Intake             1030 ml  Output              250 ml  Net              780 ml   Filed Weights   10/07/16 0618 10/08/16 0645 10/09/16 0602  Weight: 277 lb 4.8 oz (125.8 kg) 277  lb 6.4 oz (125.8 kg) 277 lb 9 oz (125.9 kg)   INTAKE / OUTPUT: I/O last 3 completed shifts: In: 770 [P.O.:720; IV Piggyback:50] Out: 250 [Urine:250]  General:  MOAAF ambulating with unsaved gait HEENT: MM pink/moist, no JVD left neck dressing intact WUJ:[id number] , smiling Neuro: Unsteady gait CV: s1s2 rrr, no m/r/g PULM: even/non-labored, lungs bilaterally decreased in bases AO:ZHYQ, non-tender, bsx4 active  Extremities: warm/dry, ++edema  Skin: no rashes or lesions  LABS:  BMET  Recent Labs Lab 10/07/16 0523 10/08/16 0543 10/09/16 0655  NA 141 139 141  K 3.0* 3.3* 3.1*  CL 115* 115* 113*  CO2 20* 20* 20*  BUN 25* 18 15  CREATININE 1.07* 0.88 0.86  GLUCOSE 87 100* 87    Electrolytes  Recent Labs Lab 10/05/16 0329  10/05/16 1556 10/06/16 0417 10/07/16 0523 10/08/16 0543 10/09/16 0655  CALCIUM  --   < >  --  7.9* 7.9* 8.0* 8.1*  MG 2.1  --  2.2 2.0  --   --   --   PHOS 4.1  --  2.7 3.3  --   --   --   < > = values in this interval not displayed.  CBC  Recent Labs Lab 10/07/16 0523 10/08/16 0543 10/08/16 1457  WBC 7.1 8.4 8.5  HGB 7.5* 7.3* 8.1*  HCT 23.8* 23.3* 26.1*  PLT 256 233 291    Coag's  Recent Labs Lab 10/03/16 1928 10/06/16 1843  APTT 37* 33  INR 1.68 1.38    Sepsis Markers  Recent Labs Lab 10/03/16 1928  10/04/16 0700 10/04/16 0838 10/05/16 0329 10/05/16 0938 10/05/16 2100  LATICACIDVEN 2.3*  < >  --  0.9  --  0.9 0.8  PROCALCITON 1.81  --  2.50  --  1.81  --   --   < > = values in this interval not displayed.  ABG  Recent Labs Lab 10/03/16 1446 10/03/16 2055 10/04/16 0636  PHART 7.444 7.368 7.416  PCO2ART 23.7* 33.4 28.5*  PO2ART 54.0* 181* 70.0*    Liver Enzymes  Recent Labs Lab 10/03/16 1408 10/04/16 1944 10/05/16 0936 10/09/16 0655  AST 34  --  14* 19  ALT 17  --  15 19  ALKPHOS 41  --  34* 34*  BILITOT 1.4*  --  0.3 0.6  ALBUMIN 2.5* 2.2* 1.9* 2.0*    Cardiac Enzymes  Recent Labs Lab  10/03/16 1928 10/03/16 2301 10/04/16 0700  TROPONINI 0.05* 0.03* <0.03    Glucose  Recent Labs Lab 10/03/16 2007 10/03/16 2347 10/04/16 1548 10/05/16 0731 10/05/16 1131 10/05/16 1926  GLUCAP 148* 175* 105* 121* 124* 75    Imaging  STUDIES:  CXR 5/16 > significant cardio megaly with large left effusion CT chest 5/16 > large circumferential pericardial effusion, large left pleural effusion with compressive atelectasias and ? L>R shift of mediastinum, small right pleural effusion  Serologies 10/04/16 ANA 1:1280 Homogenous, 1:160 speckled dsDNA > 300 Total complement 17 Smith ab >8  CULTURES: Bcx 5/16 > Tracheal aspirate  5/16 > nml flora  Sputum GS 5/16 >  UC 5/16 > neg RSV 5/16 > neg MRSA 5/16 > neg Pleural fluid 5/17 >>neg Pericardial fluid 5/17 >>neg  ANTIBIOTICS: Ceftriaxone 5/16 > Azithro 5/16 >  SIGNIFICANT EVENTS: 5/16 > admit with resp failure, intubated 5/17 > worsening UOP, CVC, Left thora, pericardiocentesis  5/18 > Extubated  LINES/TUBES: ETT 5/16 > 5/18 OGT 5/16 > 5/18 Foley 5/16 > out  L TL IJ 5/17 >  Out    ASSESSMENT / PLAN:  Acute resp failure in setting of large left effusion and pericardial effusion +/- CAP Exudative effusion  -extubated 5/18 -effusion exudate -effusion remains mod. WOB acceptable -lupus DRVVAT -->elevated  -cxr: mod left effusion (partially loculated)/ low lung volume (personally reviewed) Plan:   Cont pred at 40mg /d-->hold here; needs to be seen by rheum as outpt Will hold off on repeat thora-->should go away w/ steroids. Would repeat CXR in 7d if no improvement at that point will need repeat thora. -->IF SHE is rehab can have it then Day 6/7 antibiotics  have set her up with Dr Isaiah Serge at 915 on May 29. She will need to come in at 845 to check in-->then CXR followed by her appointment   Pericarditis with Pericardial Effusion s/p pericardiocentesis Mildly elevated troponin- likely demand, peaked at 0.05  5/16 Elevated LA - improving  Plan:  Will need echo prior to discharge Started on colchicine per cardiology   AKI. 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 There was question of CAP given elevated PCT but all culture have been neg. The plan at this point is to cont current steroid dosing. She needs a follow up cxr. If she is still in the hospital for rehab this should be obtained in about 7 days. IF effusion still present or if symptoms worsen then will need repeat thoracentesis at that point. If she leaves the hospital w/in the next week she has follow up arranged w/ Dr Alvia GroveMannem on 5/29.  Prednisone as instructed  Follow up CxR 5/28 to evaluate for possible thoracentesis. Negative I/O  Brett CanalesSteve Minor ACNP [facility] Pager [phone] till 3 pm If no answer page [phone] 10/09/2016, 11:12 AM   STAFF NOTE: I, Rory Percyaniel Leea Rambeau, MD FACP have personally reviewed patient's available data, including medical history, events of note, physical examination and test results as part of my evaluation. I have discussed with resident/NP and other care providers such as pharmacist, RN and RRT. In addition, I personally evaluated patient and elicited key findings of: awake in chair on room air, slight reduced BS left base, clear rt, no stridor, pcxr with small effusion residual not loculated, no fevers, ANA pos, DSDNA pos, this appears to be pleurisy related from lupus, she is in no distress and I am  unimpressed with effusion on pcxr, would hold off on thoracentesis as risk / benefit ratio would not support , wold rely on pred and lasix to improve this, re evaluate pcxr in a few days with therapy  She is on, if worsen may need to escalate steroids even then, I updated MOM and pt in room , wil sign off , call if needed   Mcarthur RossettiDaniel J. Tyson AliasFeinstein, MD, FACP Pgr: [phone] [facility] 10/09/2016 1:45 PM

## 2016-10-09 NOTE — Progress Notes (Signed)
I met with pt and her Mom at bedside to discuss a potential admit to inpt rehab pending her ability to tolerate more intense therapies and bed availability. Patient noticeably upset with the conversation. I will follow up tomorrow. [phone]

## 2016-10-10 ENCOUNTER — Inpatient Hospital Stay (HOSPITAL_COMMUNITY): Payer: Medicare Other

## 2016-10-10 LAB — BASIC METABOLIC PANEL
Anion gap: 4 — ABNORMAL LOW (ref 5–15)
BUN: 16 mg/dL (ref 6–20)
CALCIUM: 7.9 mg/dL — AB (ref 8.9–10.3)
CO2: 21 mmol/L — ABNORMAL LOW (ref 22–32)
Chloride: 113 mmol/L — ABNORMAL HIGH (ref 101–111)
Creatinine, Ser: 0.83 mg/dL (ref 0.44–1.00)
GFR calc Af Amer: >60 mL/min (ref >60)
GLUCOSE: 119 mg/dL — AB (ref 65–99)
Potassium: 3.7 mmol/L (ref 3.5–5.1)
Sodium: 138 mmol/L (ref 135–145)

## 2016-10-10 LAB — CBC
HEMATOCRIT: 21.8 % — AB (ref 36.0–46.0)
Hemoglobin: 6.8 g/dL — CL (ref 12.0–15.0)
MCH: 22.3 pg — ABNORMAL LOW (ref 26.0–34.0)
MCHC: 31.7 g/dL (ref 30.0–36.0)
MCV: 70.3 fL — AB (ref 78.0–100.0)
PLATELETS: 278 10*3/uL (ref 150–400)
RBC: 3.1 MIL/uL — ABNORMAL LOW (ref 3.87–5.11)
RDW: 17.1 % — AB (ref 11.5–15.5)
WBC: 7.4 10*3/uL (ref 4.0–10.5)

## 2016-10-10 LAB — GLUCOSE, CAPILLARY: Glucose-Capillary: 84 mg/dL (ref 65–99)

## 2016-10-10 LAB — ABO/RH: ABO/RH(D): O POS

## 2016-10-10 LAB — CREATININE, SERUM
Creatinine, Ser: 0.79 mg/dL (ref 0.44–1.00)
GFR calc Af Amer: >60 mL/min (ref >60)
GFR calc non Af Amer: >60 mL/min (ref >60)

## 2016-10-10 LAB — PREPARE RBC (CROSSMATCH)

## 2016-10-10 MED ORDER — SODIUM CHLORIDE 0.9 % IV SOLN
Freq: Once | INTRAVENOUS | Status: AC
  Administered 2016-10-10: 09:00:00 via INTRAVENOUS

## 2016-10-10 NOTE — Progress Notes (Signed)
I met with pt and a friend at bedside today. Pt has done well with therapy over last 2 days. Please clarify when pt medically ready for d/c to inpt rehab and I will verify bed availability. Pt is in agreement. I will follow up tomorrow. [phone]

## 2016-10-10 NOTE — Progress Notes (Signed)
PROGRESS NOTE    Erika Boyer  AVW:[id number] DOB: 23-Aug-1995 DOA: 10/03/2016 PCP: Loletta SpecterGomez, Roger David, PA-C  Brief Narrative:21 year old with history of Graves' disease on methimazole, cerebral atrophy. Brought to the ED by her mother with 3 days of worsening dyspnea. Found to be febrile, hypoxia and a chest x-ray with large left pleural effusion and possible pneumonia. Admitted to ICU, intubated, Rx with Abx, CT chest 5/16 > large circumferential pericardial effusion, large left pleural effusion with compressive atelectasias and ? L>R shift of mediastinum, small right pleural effusion. ECHO with large pericardial effusion as well/ with early tamponade. 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 vs Drug induced Lupus from Methimazole -Cultures negative, started on Prednisone, extubated [facility]   Assessment & Plan: Acute Resp failure -s/p VDRF, extubated 5/18 -see below, due to large pleural and pericardial effusion with early tamponade -improving, weaned off O2 -stopped IVF, appears volume overloaded, continue PO lasix today Plan is to go to CIR when medically stable  Large L pleural effusion -with L compressive atelectasis vs Pneumonia, clinically doubt pneumonia at this time -s/p L Thoracentesis 5/17; 850 ml of cloudy pleural fluid was obtained -FLuid analysis is exudative, cultures negative -due to SLE vs Drug induced Lupus, see below -repeat CXR 5/21 with moderate L effusion, PCCM consulted, recommended to continue prednisone for now and monitor with serial CXR in 1 week, has FU with Dr.Mannam in office 5/29   Hold off  on repeat thoracentesis  Pericarditis/Large Pericardial effusion -with early tamponade on ECHO -appreciate Cards input Status post Pericardiocentesis 5/17 per Dr.Ganji drained 870 mL of serosanguineous fluid, pericardial effusion completely resolved, started on Colchicine, but discontinued 5/22 due to suspected SLE as well repeat ECHO  Pending   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 Abnormal Serologies 10/04/16: ANA 1:1280 Homogenous, 1:160 speckled, dsDNA > 300 Total complement 17, Anti Smith ab >8-hence concerning for SLE causing all of the above CCP antibody negative at 11, ANCA negative Cultures negative, started on Prednisone now 60mg  to 40mg  daily now Methimazole held Rheum referral made, called and discussed patient with Milagros Lollr.Angela Hawkes and faxed patients notes from today, they will call pt with FU, she will continue Prednisone 40mg  daily until RHeum FU   Iron deficiency anemia, anemia of chronic disease, -due to acute illness/hemodilution and possibly low grade hemolysis from SLE -baseline Hb 9.5-10, now 6.8, no signs of bleeding, scheduled to receive one unit of packed red blood cells -schistocytes noted in smear but Bili normal, haptoglobin normal, LDH normal-all this doesn't support hemolysis -anemia panel s/o IRon defi too -Hb improving, monitor   AKI, resolved -improving, stop IVF, renal US unremarkable   ? 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  Cerebral atrophy -stable  DVT prophylaxis:lovenox Code Status: Full Code Family Communication: boyfriend at bedside, called and updated mother via telephone 5/20 Disposition Plan: Anticipate discharge to CIR 1-2 days  Consultants:   PCCM  Cards   Procedures: Thoracentesis 5/17; 850 ml of cloudy pleural fluid was obtained   Pericardiocentesis 5/17 per Dr.Ganji drained 870 mL of  serosanguineous fluid  Antimicrobials:   ROcephin/zithromax completed 5/21   Subjective:  Noted to have some tachycardia, still dyspneic at rest  Objective: Vitals:   10/09/16 1443 10/09/16 2149 10/09/16 2221 10/10/16 0613  BP: (!) 124/95 (!) 142/100  124/78  Pulse: 99 (!) 141 (!) 101 89  Resp: 18 18  18   Temp: 98 F (36.7 C) 97.8 F (36.6 C)  98.8 F (37.1 C)  TempSrc: Oral   Oral  SpO2: 99% 100%  100%  Weight:    118.9 kg (262 lb 3.2 oz)  Height:        Intake/Output Summary (Last 24 hours) at 10/10/16 0840 Last data filed at 10/09/16 1915  Gross per 24 hour  Intake             1100 ml  Output                0 ml  Net             1100 ml   Filed Weights   10/08/16 0645 10/09/16 0602 10/10/16 0613  Weight: 125.8 kg (277 lb 6.4 oz) 125.9 kg (277 lb 9 oz) 118.9 kg (262 lb 3.2 oz)    Examination:  General exam: AAOx3, obese female, laying in bed Respiratory system: decraesed BS on left Cardiovascular system: S1S2/RRR, distant heart sounds Gastrointestinal system: Abdomen is obese, distended, soft and nontender.Normal bowel sounds heard. Central nervous system: Alert and oriented. Slow congition Extremities: Symmetric 5 x 5 power. Skin: No rashes, lesions or ulcers, old excoriations noted on legs Psychiatry: flat affect, Judgement and insight appear normal..     Data Reviewed:   CBC:  Recent Labs Lab 10/03/16 1408  10/06/16 0417 10/06/16 1843 10/07/16 0523 10/08/16 0543 10/08/16 1457 10/10/16 0427  WBC 13.5*  < > 10.0  --  7.1 8.4 8.5 7.4  NEUTROABS 11.0*  --   --   --   --   --  7.5  --   HGB 9.5*  < > 7.5*  --  7.5* 7.3* 8.1* 6.8*  HCT 29.0*  < > 23.7*  --  23.8* 23.3* 26.1* 21.8*  MCV 70.2*  < > 68.7*  --  69.4* 69.8* 70.2* 70.3*  PLT 241  < > 255 291 256 233 291 278  < > = values in this interval not displayed. Basic Metabolic Panel:  Recent Labs Lab 10/04/16 1427 10/04/16 1944  10/05/16 0329  10/05/16 1556 10/06/16 0417 10/07/16 0523 10/08/16 0543 10/09/16 0655 10/09/16 2345 10/10/16 0427  NA  --  135  --   < >  --  142 141 139 141 138  --   K  --  4.1  --   < >  --  4.0 3.0* 3.3* 3.1* 3.7  --   CL  --  109  --   < >  --  115* 115* 115* 113* 113*  --   CO2  --  18*  --   < >  --  20* 20* 20* 20* 21*  --   GLUCOSE  --  125*  --   < >  --  103* 87 100* 87 119*  --   BUN  --  30*  --   < >  --  28* 25* 18 15 16   --   CREATININE  --  2.10*  --   < >  --  1.25* 1.07* 0.88 0.86 0.83 0.79  CALCIUM  --  7.6*  --   < >  --  7.9* 7.9* 8.0* 8.1* 7.9*  --   MG 2.0 2.0 2.1  --  2.2 2.0  --   --   --   --   --   PHOS 4.2 3.9  3.9 4.1  --  2.7 3.3  --   --   --   --   --   < > = values in this interval not displayed. GFR: Estimated Creatinine Clearance: 144.9 mL/min (by C-G formula based on SCr of 0.79 mg/dL). Liver Function Tests:  Recent Labs Lab 10/03/16 1408 10/04/16 1427 10/04/16 1944 10/05/16 0936 10/09/16 0655  AST 34  --   --  14* 19  ALT 17  --   --  15 19  ALKPHOS 41  --   --  34* 34*  BILITOT 1.4*  --   --  0.3 0.6  PROT 7.1 5.8*  --  5.7* 5.5*  ALBUMIN 2.5*  --  2.2* 1.9* 2.0*   No results for input(s): LIPASE, AMYLASE in the last 168 hours. No results for input(s): AMMONIA in the last 168 hours. Coagulation Profile:  Recent Labs Lab 10/03/16 1928 10/06/16 1843  INR 1.68 1.38   Cardiac Enzymes:  Recent Labs Lab 10/03/16 1928 10/03/16 2301 10/04/16 0700  TROPONINI 0.05* 0.03* <0.03   BNP (last 3 results) No results for input(s): PROBNP in the last 8760 hours. HbA1C: No results for input(s): HGBA1C in the last 72 hours. CBG:  Recent Labs Lab 10/04/16 1548 10/05/16 0731 10/05/16 1131 10/05/16 1926 10/10/16 0745  GLUCAP 105* 121* 124* 75 84   Lipid Profile: No results for input(s): CHOL, HDL, LDLCALC, TRIG, CHOLHDL, LDLDIRECT in the last 72 hours. Thyroid Function Tests: No results for input(s): TSH, T4TOTAL, FREET4,  T3FREE, THYROIDAB in the last 72 hours. Anemia Panel: No results for input(s): VITAMINB12, FOLATE, FERRITIN, TIBC, IRON, RETICCTPCT in the last 72 hours. Urine analysis:    Component Value Date/Time   COLORURINE AMBER (A) 10/03/2016 2034   APPEARANCEUR HAZY (A) 10/03/2016 2034   LABSPEC 1.024 10/03/2016 2034   PHURINE 5.0 10/03/2016 2034   GLUCOSEU NEGATIVE 10/03/2016 2034   HGBUR SMALL (A) 10/03/2016 2034   BILIRUBINUR NEGATIVE 10/03/2016 2034   KETONESUR 5 (A) 10/03/2016 2034   PROTEINUR 100 (A) 10/03/2016 2034   NITRITE NEGATIVE 10/03/2016 2034   LEUKOCYTESUR NEGATIVE 10/03/2016 2034   Sepsis Labs: @LABRCNTIP (procalcitonin:4,lacticidven:4)  ) Recent Results (from the past 240 hour(s))  Blood Culture (routine x 2)     Status: None   Collection Time: 10/03/16  1:39 PM  Result Value Ref Range Status   Specimen Description BLOOD RIGHT ANTECUBITAL  Final   Special Requests   Final    BOTTLES DRAWN AEROBIC AND ANAEROBIC Blood Culture adequate volume   Culture NO GROWTH 5 DAYS  Final   Report Status 10/08/2016 FINAL  Final  Blood Culture (routine x 2)     Status: None   Collection Time: 10/03/16  2:40 PM  Result Value Ref Range Status   Specimen Description BLOOD RIGHT HAND  Final   Special Requests   Final    BOTTLES DRAWN AEROBIC AND ANAEROBIC Blood Culture adequate volume   Culture NO GROWTH 5 DAYS  Final   Report Status 10/08/2016 FINAL  Final  Culture, respiratory (NON-Expectorated)     Status: None   Collection Time: 10/03/16  5:55 PM  Result Value Ref Range Status   Specimen Description TRACHEAL ASPIRATE  Final   Special Requests NONE  Final   Gram  Stain   Final    MODERATE WBC PRESENT,BOTH PMN AND MONONUCLEAR FEW GRAM POSITIVE COCCI RARE GRAM POSITIVE RODS RARE GRAM NEGATIVE RODS    Culture Consistent with normal respiratory flora.  Final   Report Status 10/06/2016 FINAL  Final  MRSA PCR Screening     Status: None   Collection Time: 10/03/16  7:44 PM  Result  Value Ref Range Status   MRSA by PCR NEGATIVE NEGATIVE Final    Comment:        The GeneXpert MRSA Assay (FDA approved for NASAL specimens only), is one component of a comprehensive MRSA colonization surveillance program. It is not intended to diagnose MRSA infection nor to guide or monitor treatment for MRSA infections.   Culture, Urine     Status: None   Collection Time: 10/03/16  8:34 PM  Result Value Ref Range Status   Specimen Description URINE, RANDOM  Final   Special Requests NONE  Final   Culture NO GROWTH  Final   Report Status 10/05/2016 FINAL  Final  Respiratory Panel by PCR     Status: None   Collection Time: 10/03/16  8:34 PM  Result Value Ref Range Status   Adenovirus NOT DETECTED NOT DETECTED Final   Coronavirus 229E NOT DETECTED NOT DETECTED Final   Coronavirus HKU1 NOT DETECTED NOT DETECTED Final   Coronavirus NL63 NOT DETECTED NOT DETECTED Final   Coronavirus OC43 NOT DETECTED NOT DETECTED Final   Metapneumovirus NOT DETECTED NOT DETECTED Final   Rhinovirus / Enterovirus NOT DETECTED NOT DETECTED Final   Influenza A NOT DETECTED NOT DETECTED Final   Influenza B NOT DETECTED NOT DETECTED Final   Parainfluenza Virus 1 NOT DETECTED NOT DETECTED Final   Parainfluenza Virus 2 NOT DETECTED NOT DETECTED Final   Parainfluenza Virus 3 NOT DETECTED NOT DETECTED Final   Parainfluenza Virus 4 NOT DETECTED NOT DETECTED Final   Respiratory Syncytial Virus NOT DETECTED NOT DETECTED Final   Bordetella pertussis NOT DETECTED NOT DETECTED Final   Chlamydophila pneumoniae NOT DETECTED NOT DETECTED Final   Mycoplasma pneumoniae NOT DETECTED NOT DETECTED Final  Body fluid culture     Status: None   Collection Time: 10/04/16  2:01 PM  Result Value Ref Range Status   Specimen Description THORACENTESIS  Final   Special Requests Normal  Final   Gram Stain   Final    MODERATE WBC PRESENT,BOTH PMN AND MONONUCLEAR NO ORGANISMS SEEN    Culture NO GROWTH 3 DAYS  Final   Report  Status 10/07/2016 FINAL  Final  Culture, body fluid-bottle     Status: None   Collection Time: 10/04/16  5:42 PM  Result Value Ref Range Status   Specimen Description FLUID PERICARDIAL  Final   Special Requests BOTTLES DRAWN AEROBIC AND ANAEROBIC 10CC  Final   Culture NO GROWTH 5 DAYS  Final   Report Status 10/09/2016 FINAL  Final  Gram stain     Status: None   Collection Time: 10/04/16  5:42 PM  Result Value Ref Range Status   Specimen Description FLUID PERICARDIAL  Final   Special Requests NONE  Final   Gram Stain   Final    ABUNDANT WBC PRESENT,BOTH PMN AND MONONUCLEAR NO ORGANISMS SEEN    Report Status 10/04/2016 FINAL  Final         Radiology Studies: Dg Chest 2 View  Result Date: 10/08/2016 CLINICAL DATA:  Shortness of breath and weakness EXAM: CHEST  2 VIEW COMPARISON:  Two days prior  FINDINGS: Cardiopericardial enlargement that is similar to priors. There has been pericardiocentesis per EMR. Moderate left pleural effusion tracking laterally. The underlying left lower lobe is opacified, nonspecific between pneumonia and atelectasis. No pneumothorax. IMPRESSION: 1. Unchanged moderate left pleural effusion and opacified left lower lobe. 2. Persistent cardiopericardial enlargement despite pericardiocentesis. Electronically Signed   By: Marnee Spring M.D.   On: 10/08/2016 08:57        Scheduled Meds: . aspirin  81 mg Oral Daily  . Chlorhexidine Gluconate Cloth  6 each Topical Daily  . enoxaparin (LOVENOX) injection  40 mg Subcutaneous Q24H  . furosemide  20 mg Oral Daily  . metoprolol tartrate  25 mg Oral BID  . multivitamin  1 tablet Oral Daily  . pantoprazole  40 mg Oral Q1200  . predniSONE  40 mg Oral Q breakfast  . sodium chloride flush  10-40 mL Intracatheter Q12H   Continuous Infusions: . sodium chloride       LOS: 7 days    Time spent:      [facility] Pager [phone]  If 7PM-7AM, please contact  night-coverage [email or web address] Password [email or web address] 10/10/2016, 8:40 AM

## 2016-10-10 NOTE — Progress Notes (Signed)
  Echocardiogram 2D Echocardiogram has been performed.  Erika PartridgeBrooke S Emmalou Boyer 10/10/2016, 12:50 PM

## 2016-10-10 NOTE — Progress Notes (Signed)
Physical Therapy Treatment Patient Details Name: Erika Boyer MRN: [id number] DOB: Dec 06, 1995 Today's Date: 10/10/2016    History of Present Illness 21 year old with history of Graves' disease on methimazole, cerebral atrophy. Brought to the ED by her mother with 3 days of worsening dyspnea. Found to have temperature of 103 and a chest x-ray with large left pleural effusion, likely from severe community acquired pneumonia or viral illness.    PT Comments    Pt progressing with mobility but is very impulsive with decreased insight into safety awareness. Jumps up quickly but loses balance with initial standing, needing min A to maintain standing. Has difficulty changing directions with ambulation and fatigues very quickly. Ambulated 40', 40', 5', 5' with seated rest breaks in between each bout. Gait pattern deteriorates with increased distance. Would very much benefit from CIR level therapy. PT will continue to follow.    Follow Up Recommendations  CIR     Equipment Recommendations  None recommended by PT    Recommendations for Other Services Rehab consult     Precautions / Restrictions Precautions Precautions: Fall Restrictions Weight Bearing Restrictions: No    Mobility  Bed Mobility Overal bed mobility: Needs Assistance Bed Mobility: Supine to Sit     Supine to sit: Supervision     General bed mobility comments: pt able to get to EOB with increased time and vc's. Having difficulty moving legs due to swelling  Transfers Overall transfer level: Needs assistance Equipment used: Rolling walker (2 wheeled) Transfers: Sit to/from Stand Sit to Stand: Min assist         General transfer comment: pt quickly stands from bed without AD as well as from toilet but is very unsteady once up and requires min A to steady. Urged her to have RW prepared in front of her and to wait for assistance for safety  Ambulation/Gait Ambulation/Gait assistance: Min assist;+2  safety/equipment Ambulation Distance (Feet): 40 Feet (2x, then 5' 2x, seated rest in between) Assistive device: Rolling walker (2 wheeled) Gait Pattern/deviations: Step-through pattern;Wide base of support;Decreased stride length;Staggering left;Staggering right;Ataxic Gait velocity: decreased Gait velocity interpretation: <1.8 ft/sec, indicative of risk for recurrent falls General Gait Details: pt reports that the swelling in her feet makes it feel like she's walking on balloons. Ataxic gait, worse without RW than when using RW. Frequent vc's for staying close to RW. Has difficulty with changes in direction   Stairs            Wheelchair Mobility    Modified Rankin (Stroke Patients Only)       Balance Overall balance assessment: Needs assistance Sitting-balance support: No upper extremity supported;Feet supported Sitting balance-Leahy Scale: Good Sitting balance - Comments: pt able to maintain balance EOB but lack of trunk control noted in static sitting   Standing balance support: Bilateral upper extremity supported;During functional activity Standing balance-Leahy Scale: Poor Standing balance comment: reliance on RW                            Cognition Arousal/Alertness: Awake/alert Behavior During Therapy: WFL for tasks assessed/performed;Impulsive Overall Cognitive Status: Within Functional Limits for tasks assessed                                 General Comments: flat affect and childlike      Exercises      General Comments General comments (skin integrity, edema, etc.): BLE  edema      Pertinent Vitals/Pain Pain Assessment: No/denies pain    Home Living                      Prior Function            PT Goals (current goals can now be found in the care plan section) Acute Rehab PT Goals Patient Stated Goal: independence PT Goal Formulation: With patient Time For Goal Achievement: 10/21/16 Potential to Achieve  Goals: Good Progress towards PT goals: Progressing toward goals    Frequency    Min 4X/week      PT Plan Current plan remains appropriate    Boyer-evaluation              AM-PAC PT "6 Clicks" Daily Activity  Outcome Measure  Difficulty turning over in bed (including adjusting bedclothes, sheets and blankets)?: A Little Difficulty moving from lying on back to sitting on the side of the bed? : Total Difficulty sitting down on and standing up from a chair with arms (e.g., wheelchair, bedside commode, etc,.)?: Total Help needed moving to and from a bed to chair (including a wheelchair)?: A Little Help needed walking in hospital room?: A Lot Help needed climbing 3-5 steps with a railing? : A Lot 6 Click Score: 12    End of Session Equipment Utilized During Treatment: Gait belt Activity Tolerance: Patient tolerated treatment well Patient left: in bed;with call bell/phone within reach;with family/visitor present Nurse Communication: Mobility status PT Visit Diagnosis: Muscle weakness (generalized) (M62.81);Other abnormalities of gait and mobility (R26.89)     Time: 1610-9604 PT Time Calculation (min) (ACUTE ONLY): 23 min  Charges:  $Gait Training: 23-37 mins                    G Codes:       Erika Boyer, PT  Acute Rehab Services  [phone]    Erika Boyer 10/10/2016, 4:03 PM

## 2016-10-10 NOTE — Progress Notes (Signed)
Pt refusing to start blood transfusion till she can speak with MD. MD notified  And said she would come speak with pt

## 2016-10-10 NOTE — Progress Notes (Signed)
CRITICAL VALUE ALERT  Critical Value:  Hbg 6.8  Date & Time Notied: 10/10/16 0534  Provider Notified: Clearence PedSchorr, NP  Orders Received/Actions taken: Schorr, NP order to transfuse 1 unit PRBCs.

## 2016-10-11 ENCOUNTER — Encounter (HOSPITAL_COMMUNITY): Payer: Self-pay | Admitting: General Practice

## 2016-10-11 LAB — COMPREHENSIVE METABOLIC PANEL
ALT: 18 U/L (ref 14–54)
AST: 17 U/L (ref 15–41)
Albumin: 2.1 g/dL — ABNORMAL LOW (ref 3.5–5.0)
Alkaline Phosphatase: 45 U/L (ref 38–126)
Anion gap: 5 (ref 5–15)
BUN: 14 mg/dL (ref 6–20)
CALCIUM: 8.1 mg/dL — AB (ref 8.9–10.3)
CHLORIDE: 113 mmol/L — AB (ref 101–111)
CO2: 23 mmol/L (ref 22–32)
CREATININE: 0.86 mg/dL (ref 0.44–1.00)
Glucose, Bld: 102 mg/dL — ABNORMAL HIGH (ref 65–99)
Potassium: 3.4 mmol/L — ABNORMAL LOW (ref 3.5–5.1)
Sodium: 141 mmol/L (ref 135–145)
Total Bilirubin: 0.4 mg/dL (ref 0.3–1.2)
Total Protein: 5.3 g/dL — ABNORMAL LOW (ref 6.5–8.1)

## 2016-10-11 LAB — CBC
HCT: 24.6 % — ABNORMAL LOW (ref 36.0–46.0)
HCT: 25.4 % — ABNORMAL LOW (ref 36.0–46.0)
Hemoglobin: 7.7 g/dL — ABNORMAL LOW (ref 12.0–15.0)
Hemoglobin: 8 g/dL — ABNORMAL LOW (ref 12.0–15.0)
MCH: 22.7 pg — ABNORMAL LOW (ref 26.0–34.0)
MCH: 22.9 pg — ABNORMAL LOW (ref 26.0–34.0)
MCHC: 31.3 g/dL (ref 30.0–36.0)
MCHC: 31.5 g/dL (ref 30.0–36.0)
MCV: 72.6 fL — ABNORMAL LOW (ref 78.0–100.0)
MCV: 72.8 fL — AB (ref 78.0–100.0)
PLATELETS: 312 10*3/uL (ref 150–400)
PLATELETS: 389 10*3/uL (ref 150–400)
RBC: 3.39 MIL/uL — ABNORMAL LOW (ref 3.87–5.11)
RBC: 3.49 MIL/uL — AB (ref 3.87–5.11)
RDW: 18.3 % — AB (ref 11.5–15.5)
RDW: 18.2 % — ABNORMAL HIGH (ref 11.5–15.5)
WBC: 7.2 10*3/uL (ref 4.0–10.5)
WBC: 8 10*3/uL (ref 4.0–10.5)

## 2016-10-11 LAB — BPAM RBC
Blood Product Expiration Date: 201806182359
ISSUE DATE / TIME: 201805231059
Unit Type and Rh: 5100

## 2016-10-11 LAB — TYPE AND SCREEN
ABO/RH(D): O POS
Antibody Screen: NEGATIVE
Unit division: 0

## 2016-10-11 LAB — ECHOCARDIOGRAM LIMITED
Height: 65 in
Weight: 4195.2 oz

## 2016-10-11 MED ORDER — FUROSEMIDE 20 MG PO TABS: 20.0000 mg | ORAL_TABLET | Freq: Every day | ORAL | 1 refills | Status: DC

## 2016-10-11 MED ORDER — METOPROLOL TARTRATE 25 MG PO TABS: 25.0000 mg | ORAL_TABLET | Freq: Two times a day (BID) | ORAL | 1 refills | Status: DC

## 2016-10-11 MED ORDER — POTASSIUM CHLORIDE 20 MEQ/15ML (10%) PO SOLN
20.0000 meq | Freq: Two times a day (BID) | ORAL | Status: DC
  Administered 2016-10-11 – 2016-10-12 (×3): 20 meq via ORAL
  Filled 2016-10-11 (×3): qty 15

## 2016-10-11 MED ORDER — ASPIRIN 81 MG PO CHEW: 81.0000 mg | CHEWABLE_TABLET | Freq: Every day | ORAL | 1 refills | Status: DC

## 2016-10-11 MED ORDER — POTASSIUM CHLORIDE 20 MEQ/15ML (10%) PO SOLN: 20.0000 meq | Freq: Two times a day (BID) | ORAL | Status: DC

## 2016-10-11 MED ORDER — PREDNISONE 20 MG PO TABS: 40.0000 mg | ORAL_TABLET | Freq: Every day | ORAL | 0 refills | Status: DC

## 2016-10-11 MED ORDER — PANTOPRAZOLE SODIUM 40 MG PO TBEC: 40.0000 mg | DELAYED_RELEASE_TABLET | Freq: Every day | ORAL | 1 refills | Status: DC

## 2016-10-11 MED ORDER — POTASSIUM CHLORIDE 20 MEQ/15ML (10%) PO SOLN: 20.0000 meq | Freq: Two times a day (BID) | ORAL | 0 refills | Status: DC

## 2016-10-11 MED ORDER — DOCUSATE SODIUM 50 MG/5ML PO LIQD: 100.0000 mg | Freq: Two times a day (BID) | ORAL | 0 refills | Status: DC | PRN

## 2016-10-11 NOTE — H&P (Signed)
Physical Medicine and Rehabilitation Admission H&P    Chief Complaint  Patient presents with  . Debility    HPI:  Erika Boyer is a 21 y.o. 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 CT chest reviewed, showing pleural and pericardial effusion. She was found to have large left pleural effusion due to CAP or viral illness as well as large pericardial effusion. she was intubated in ED and underwent left thoracocentesis for evacuation of 850 cc of cloudy exudative pleural effusion. She was placed on IV antibiotics as well as steroids due to concerns of drug induced SLE..   2D echo with EF 60-65% with severe circumferential effusion with early tamponade and on 5/17 and she underwent pericardiocentesis of 870 cc of serosanguineous fluid. She was placed on colchicine empirically by Dr. Einar Gip due to concerns of acute peritonitis v/s viral pericarditis with recommendations to repeat echo 1-2 days prior to discharge.  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 [facility] hospital[facility] has discussed case with Dr. Gavin Pound who recommended continuing prednisone 40 mg daily till follow up in office (they will contact patient with appt). Repeat 2 D echo on 5/23 showed EF 55-60% with small to moderate partially loculated pericardial effusion with fibrinous strands c/w hemopericardium, organized thrombus and no chamber collapse or tamponade.  AKI has improved with hydration.   Anemia of chronic disease with drop in H/H to 6.8/21.8 treated with on unit PRBC. Her respiratory status and endurance is showing improvement.  Therapy ongoing and she was noted to to be debilitated with substantial deficits in  balance, mobility and decreased ability to carry out ADL tasks. CIR recommended for follow up therapy   Review of Systems  Constitutional: Positive for malaise/fatigue. Negative for chills and fever.  HENT: Negative for hearing loss and tinnitus.   Eyes: Negative for blurred vision and double vision.  Respiratory: Positive for cough, shortness of breath (chest feels tight) and wheezing.   Cardiovascular: Positive for leg swelling. Negative for chest pain.  Gastrointestinal: Positive for abdominal pain (bloating?) and diarrhea.  Genitourinary: Negative for dysuria and urgency.  Musculoskeletal: Positive for back pain and joint pain (right knee pain since fall a couple of months ago).  Skin: Negative for itching and rash.  Neurological: Positive for weakness. Negative for dizziness, sensory change, focal weakness and headaches.  Psychiatric/Behavioral: Negative for depression. The patient is not nervous/anxious.       Past Medical History:  Diagnosis Date  . Anemia   . Anxiety   . Arthritis    "right knee" (10/11/2016)  . Cerebellar degeneration   . Chronic bronchitis ([place])   . Daily headache   . Depression   . Graves disease 2012   hx; "was on RX; it disappeared" (10/11/2016)  . History of blood transfusion    "related to the anemia" (10/11/2016)  . Hypertension   . Lupus    "not sure what kind" (10/11/2016)  . Pneumonia 10/03/2016     Past Surgical History:  Procedure Laterality Date  . PERICARDIOCENTESIS N/A 10/04/2016   Procedure: Pericardiocentesis;  Surgeon: Adrian Prows, MD;  Location: [facility];  Service: Cardiovascular;  Laterality: N/A;  . REDUCTION  MAMMAPLASTY Bilateral 2012  . TONSILLECTOMY      Family History  Problem Relation Age of Onset  . Thalassemia Mother   . Arthritis Mother   . High blood pressure Father   . Mental illness Father        question bipolar disorder per wife  . Sickle cell trait Maternal Aunt       Social History:  Single.  Lives in mother and boyfriend? (there most of the time). Moved from [place] a couple of months ago.  She reports that she has never smoked. She has never used smokeless tobacco. She reports that she does not drink alcohol or use drugs.     Allergies  Allergen Reactions  . Shellfish Allergy Anaphylaxis  . Morphine And Related Itching  . Mushroom Extract Complex Other (See Comments)    Mother is allergic, so patient avoids these  . Tapazole [Methimazole] Swelling and Rash    WELTS (also) Mother suspects this MAY be an allergy, as the onset of symptoms coincided with this being started    Medications Prior to Admission  Medication Sig Dispense Refill  . albuterol (PROAIR HFA) 108 (90 Base) MCG/ACT inhaler Inhale 2 puffs into the lungs every 4 (four) hours as needed for wheezing or shortness of breath.    . EPINEPHrine (EPIPEN 2-PAK) 0.3 mg/0.3 mL IJ SOAJ injection Inject 0.3 mg into the muscle once as needed (for anaphylaxis).     Marland Kitchen ibuprofen (ADVIL,MOTRIN) 600 MG tablet Take 600 mg by mouth every 6 (six) hours as needed (for pain).    . methimazole (TAPAZOLE) 10 MG tablet Take 1.5 tablets (15 mg total) by mouth daily. 45 tablet 2    Home: Home Living Family/patient expects to be discharged to:: Private residence Living Arrangements: Parent Available Help at Discharge: Family (Mom works partime 3 days per week) Type of Home: Apartment Home Access: Stairs to enter Technical brewer of Steps: 1 Home Layout: 1/2 bath on main level, Two level, Able to live on main level with bedroom/bathroom Alternate Level Stairs-Number of Steps: flight Alternate Level Stairs-Rails: Right, Left Bathroom Shower/Tub: Tub/shower unit, Curtain (pt feels she needs handicapped bars in bath) Bathroom Toilet: Standard Bathroom Accessibility: Yes Home Equipment: Walker - 2 wheels Additional Comments: Obtained RW after moving to [place], but only used in occassionally in the last few days prior to  hospitalization.  Lives With:  (Mom)   Functional History: Prior Function Level of Independence: Independent Comments: move from [place] 2 months ago; boyfriend from [place]  Functional Status:  Mobility: Bed Mobility Overal bed mobility: Needs Assistance Bed Mobility: Supine to Sit Supine to sit: Supervision Sit to supine: Min assist General bed mobility comments: In chair upon entry  Transfers Overall transfer level: Needs assistance Equipment used: Rolling walker (2 wheeled) Transfers: Sit to/from Stand Sit to Stand: Min assist General transfer comment: Min A for steadying upon standing. Required safety cues to wait for PT to stand.  Ambulation/Gait Ambulation/Gait assistance: Min assist, Mod assist Ambulation Distance (Feet): 75 Feet Assistive device: Rolling walker (2 wheeled) Gait Pattern/deviations: Step-through pattern, Ataxic, Staggering left, Staggering right General Gait Details: Very unsteady gait with RW. Multiple safety cues for appropriate proximity and sequencing with RW. Ataxic gait, requiring up to mod A for steadying. Required verbal cues to stop and catch balance before continuing distance.  Gait velocity: decreased Gait velocity interpretation: Below normal speed for age/gender    ADL:    Cognition: Cognition Overall Cognitive Status: Impaired/Different from baseline Orientation Level:  Oriented X4 Cognition Arousal/Alertness: Awake/alert Behavior During Therapy: WFL for tasks assessed/performed, Impulsive Overall Cognitive Status: Impaired/Different from baseline Area of Impairment: Memory, Awareness, Safety/judgement, Problem solving Memory: Decreased short-term memory Safety/Judgement: Decreased awareness of safety, Decreased awareness of deficits Awareness: Emergent Problem Solving: Slow processing, Difficulty sequencing, Requires verbal cues, Requires tactile cues General Comments: Decreased awareness of safety during gait with RW and required  continuous cues throughout. Difficulty sequencing with RW and easy distractibility during gait.    Blood pressure 133/89, pulse 87, temperature 99.7 F (37.6 C), temperature source Oral, resp. rate 17, height 5' 5"  (1.651 m), weight 122.5 kg (270 lb), last menstrual period 10/08/2016, SpO2 95 %. Physical Exam  Nursing note and vitals reviewed. Constitutional: She is oriented to person, place, and time. She appears well-developed and well-nourished. No distress.  HENT:  Head: Normocephalic and atraumatic.  Mouth/Throat: Oropharynx is clear and moist.  Eyes: Conjunctivae are normal. Pupils are equal, round, and reactive to light.  Neck: Normal range of motion. Neck supple.  Cardiovascular: Normal rate and regular rhythm.   Respiratory: No accessory muscle usage or stridor. No respiratory distress. She has decreased breath sounds.  Increased WOB with conversation and audible wheeze at times.   GI: Soft. Bowel sounds are normal. She exhibits no distension. There is no rebound.  Musculoskeletal: She exhibits edema. She exhibits no tenderness.  2+ edema BLE  Neurological: She is alert and oriented to person, place, and time.  Skin: Skin is warm and dry. She is not diaphoretic.  Psychiatric: She has a normal mood and affect. Her behavior is normal. Thought content normal.    Results for orders placed or performed during the hospital encounter of 10/03/16 (from the past 48 hour(s))  CBC     Status: Abnormal   Collection Time: 10/11/16  5:31 AM  Result Value Ref Range   WBC 7.2 4.0 - 10.5 K/uL   RBC 3.39 (L) 3.87 - 5.11 MIL/uL   Hemoglobin 7.7 (L) 12.0 - 15.0 g/dL   HCT 24.6 (L) 36.0 - 46.0 %   MCV 72.6 (L) 78.0 - 100.0 fL   MCH 22.7 (L) 26.0 - 34.0 pg   MCHC 31.3 30.0 - 36.0 g/dL   RDW 18.3 (H) 11.5 - 15.5 %   Platelets 312 150 - 400 K/uL  Comprehensive metabolic panel     Status: Abnormal   Collection Time: 10/11/16  5:31 AM  Result Value Ref Range   Sodium 141 135 - 145 mmol/L    Potassium 3.4 (L) 3.5 - 5.1 mmol/L   Chloride 113 (H) 101 - 111 mmol/L   CO2 23 22 - 32 mmol/L   Glucose, Bld 102 (H) 65 - 99 mg/dL   BUN 14 6 - 20 mg/dL   Creatinine, Ser 0.86 0.44 - 1.00 mg/dL   Calcium 8.1 (L) 8.9 - 10.3 mg/dL   Total Protein 5.3 (L) 6.5 - 8.1 g/dL   Albumin 2.1 (L) 3.5 - 5.0 g/dL   AST 17 15 - 41 U/L   ALT 18 14 - 54 U/L   Alkaline Phosphatase 45 38 - 126 U/L   Total Bilirubin 0.4 0.3 - 1.2 mg/dL   GFR calc non Af Amer >60 >60 mL/min   GFR calc Af Amer >60 >60 mL/min    Comment: (NOTE) The eGFR has been calculated using the CKD EPI equation. This calculation has not been validated in all clinical situations. eGFR's persistently <60 mL/min signify possible Chronic Kidney Disease.    Anion gap  5 5 - 15  CBC     Status: Abnormal   Collection Time: 10/11/16  9:56 AM  Result Value Ref Range   WBC 8.0 4.0 - 10.5 K/uL   RBC 3.49 (L) 3.87 - 5.11 MIL/uL   Hemoglobin 8.0 (L) 12.0 - 15.0 g/dL   HCT 25.4 (L) 36.0 - 46.0 %   MCV 72.8 (L) 78.0 - 100.0 fL   MCH 22.9 (L) 26.0 - 34.0 pg   MCHC 31.5 30.0 - 36.0 g/dL   RDW 18.2 (H) 11.5 - 15.5 %   Platelets 389 150 - 400 K/uL   No results found.     Medical Problem List and Plan: 1.  Debility  secondary to Sepsis, lupus with multiple medical issues.    -admit to inpatient rehab 2.  DVT Prophylaxis/Anticoagulation: Pharmaceutical: Lovenox 3. Pain Management: tylenol prn as well as increase in mobility should help with back pain.  4. Mood: LCSW to follow for evaluation and support.  5. Neuropsych: This patient is not fully capable of making decisions on his own behalf. 6. Skin/Wound Care: routine pressure relief measures. Add protein supplement for low protein stores and to help with edema.  7. Fluids/Electrolytes/Nutrition: Monitor I/O. Check lytes in am.  8. CAP with SOB: Treated and respiratory status improving. Encourage pulmonary hygiene with flutter valve. Will add nebulizer's as mother concerned about   Breathing/ bronchitis.  9. Pleural effusion: will need repeat CXR 5/28 with thoracocentesis if still present or worse. 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 To continue prednisone 40 mg daily--will check Hgb A1c for baseline. To follow up with Dr. Trudie Reed after dischage 11. Peripheral edema: Monitor weights daily.  12. Iron deficiency anemia:  Improved with transfusion. Add iron supplement. :  13. Tachycardia: Monitor BP/HR bid --continue metoprolol bid.  14. Hyperthyroid: off methamazole.     Post Admission Physician Evaluation: 1. Functional deficits secondary  to debility from multiple medical issues. 2. Patient is admitted to receive collaborative, interdisciplinary care between the physiatrist, rehab nursing staff, and therapy team. 3. Patient's level of medical complexity and substantial therapy needs in context of that medical necessity cannot be provided at a lesser intensity of care such as a SNF. 4. Patient has experienced substantial functional loss from his/her baseline which was documented above under the "Functional History" and "Functional Status" headings.  Judging by the patient's diagnosis, physical exam, and functional history, the patient has potential for functional progress which will result in measurable gains while on inpatient rehab.  These gains will be of substantial and practical use upon discharge  in facilitating mobility and self-care at the household level. 5. Physiatrist will provide 24 hour management of medical needs as well as oversight of the therapy plan/treatment and provide guidance as appropriate regarding the interaction of the two. 6. The Preadmission Screening has been reviewed and patient status is unchanged unless otherwise stated above. 7. 24 hour rehab nursing will assist with bladder management, bowel management, safety, skin/wound care, disease management, medication administration, pain management and patient education  and help integrate therapy concepts,  techniques,education, etc. 8. PT will assess and treat for/with: Lower extremity strength, range of motion, stamina, balance, functional mobility, safety, adaptive techniques and equipment, pain mgt activity tolerance.   Goals are: mod I. 9. OT will assess and treat for/with: ADL's, functional mobility, safety, upper extremity strength, adaptive techniques and equipment, pain control, family education.   Goals are: mod I. Therapy may proceed with showering this patient. 10. SLP will assess and  treat for/with: n/a.  Goals are: n/a. 11. Case Management and Social Worker will assess and treat for psychological issues and discharge planning. 12. Team conference will be held weekly to assess progress toward goals and to determine barriers to discharge. 13. Patient will receive at least 3 hours of therapy per day at least 5 days per week. 14. ELOS: 12-15 days       15. Prognosis:  excellent     Meredith Staggers, MD, [facility] 10/12/2016  Bary Leriche, Hershal Coria 10/12/2016

## 2016-10-11 NOTE — Care Management Important Message (Signed)
Important Message  Patient Details  Name: Erika Boyer MRN: [id number] Date of Birth: 1996-02-25   Medicare Important Message Given:  Yes    Grainne Knights [place] 10/11/2016, 10:31 AM

## 2016-10-11 NOTE — Care Management Note (Signed)
Case Management Note  Patient Details  Name: Erika Boyer MRN: [id number] Date of Birth: [birth year]  Subjective/Objective:    Acute resp failure, large pleural effusion, large pericardial effusion, new dx Lupus                Action/Plan: Discharge Planning: Chart reviewed. And plan is dc today to CIR if bed available.    Expected Discharge Date:                 Expected Discharge Plan:  IP Rehab Facility  In-House Referral:  Clinical Social Work  Discharge planning Services  CM Consult  Post Acute Care Choice:  NA Choice offered to:  NA  DME Arranged:  N/A DME Agency:  NA  HH Arranged:  NA HH Agency:  NA  Status of Service:  Completed, signed off  If discussed at Long Length of Stay Meetings, dates discussed:  10/11/2016   Additional Comments:  Elliot CousinShavis, Jannelly Bergren Ellen, RN 10/11/2016, 9:35 AM

## 2016-10-11 NOTE — Discharge Summary (Signed)
Physician Discharge Summary  Erika Boyer MRN: [id number] DOB/AGE: 10-Mar-1996 21 y.o.  PCP: [name], PA-C   Admit date: 10/03/2016 Discharge date: 10/11/2016  Discharge Diagnoses:    Principal Problem:   Sepsis [facility]) Active Problems:   Acute respiratory failure with hypoxia ([place])   CAP (community acquired pneumonia)   Hyperthyroidism   Morbid obesity with BMI of 40.0-44.9, adult ([place])   Acute kidney injury ([place])   Cerebellar degeneration   Microcytic anemia   Cardiomegaly   Acute respiratory failure (HCC)   S/P thoracentesis   Leg swelling   Shortness of breath   Cerebral atrophy   MR (mental retardation)   Drug-induced lupus erythematosus   Tachycardia   Hypokalemia   Acute blood loss anemia 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 If she leaves the hospital w/in the next week she has follow up arranged w/ Dr Wonda Amis on 5/29.        Current Discharge Medication List    START taking these medications   Details  aspirin 81 MG chewable tablet Chew 1 tablet (81 mg total) by mouth daily. Qty: 30 tablet, Refills: 1    docusate (COLACE) 50 MG/5ML liquid Take 10 mLs (100 mg total) by mouth 2 (two) times daily as needed for mild constipation. Qty: 100 mL, Refills: 0    furosemide (LASIX) 20 MG tablet Take 1 tablet (20 mg total) by mouth daily. Qty: 30 tablet, Refills: 1    metoprolol tartrate (LOPRESSOR) 25 MG tablet Take 1 tablet (25 mg total) by mouth 2 (two) times daily. Qty: 60 tablet, Refills: 1    pantoprazole (PROTONIX) 40 MG tablet Take 1 tablet (40 mg total) by mouth daily at 12 noon. Qty: 30  tablet, Refills: 1    potassium chloride 20 MEQ/15ML (10%) SOLN Take 15 mLs (20 mEq total) by mouth 2 (two) times daily. Qty: 900 mL, Refills: 0    predniSONE (DELTASONE) 20 MG tablet Take 2 tablets (40 mg total) by mouth daily with breakfast. Qty: 30 tablet, Refills: 0      CONTINUE these medications which have NOT CHANGED   Details  albuterol (PROAIR HFA) 108 (90 Base) MCG/ACT inhaler Inhale 2 puffs into the lungs every 4 (four) hours as needed for wheezing or shortness of breath.    EPINEPHrine (EPIPEN 2-PAK) 0.3 mg/0.3 mL IJ SOAJ injection Inject 0.3 mg into the muscle once as needed (for anaphylaxis).       STOP taking these medications     ibuprofen (ADVIL,MOTRIN) 600 MG tablet      methimazole (TAPAZOLE) 10 MG tablet          Discharge Condition: Prognosis guarded   Discharge Instructions Get Medicines reviewed and adjusted: Please take all your medications with you for your next visit with your Primary MD  Please request your Primary MD to go over all hospital tests and procedure/radiological results at the follow up, please ask your Primary MD to get all Hospital records sent to his/her office.  If you experience worsening of your admission symptoms, develop shortness of breath, life threatening emergency, suicidal or homicidal thoughts you must seek medical attention immediately by calling 911 or calling your MD  immediately if symptoms less severe.  You must read complete instructions/literature along with all the possible adverse reactions/side effects for all the Medicines you take and that have been prescribed to you. Take any new Medicines after you have completely understood and accpet all the possible adverse reactions/side effects.   Do not drive when taking Pain medications.   Do not take more than prescribed Pain, Sleep and Anxiety Medications  Special Instructions: If you have smoked or chewed Tobacco in the last 2 yrs please stop smoking, stop any  regular Alcohol and or any Recreational drug use.  Wear Seat belts while driving.  Please note  You were cared for by a hospitalist during your hospital stay. Once you are discharged, your primary care physician will handle any further medical issues. Please note that NO REFILLS for any discharge medications will be authorized once you are discharged, as it is imperative that you return to your primary care physician (or establish a relationship with a primary care physician if you do not have one) for your aftercare needs so that they can reassess your need for medications and monitor your lab values.     Allergies  Allergen Reactions  . Shellfish Allergy Anaphylaxis  . Morphine And Related Itching  . Mushroom Extract Complex Other (See Comments)    Mother is allergic, so patient avoids these  . Tapazole [Methimazole] Swelling and Rash    WELTS (also) Mother suspects this MAY be an allergy, as the onset of symptoms coincided with this being started      Disposition: CIR   Consults:  Critical care   Significant Diagnostic Studies:  Dg Chest 2 View  Result Date: 10/08/2016 CLINICAL DATA:  Shortness of breath and weakness EXAM: CHEST  2 VIEW COMPARISON:  Two days prior FINDINGS: Cardiopericardial enlargement that is similar to priors. There has been pericardiocentesis per EMR. Moderate left pleural effusion tracking laterally. The underlying left lower lobe is opacified, nonspecific between pneumonia and atelectasis. No pneumothorax. IMPRESSION: 1. Unchanged moderate left pleural effusion and opacified left lower lobe. 2. Persistent cardiopericardial enlargement despite pericardiocentesis. Electronically Signed   By: Monte Fantasia M.D.   On: 10/08/2016 08:57   Ct Chest Wo Contrast  Result Date: 10/03/2016 CLINICAL DATA:  Initial evaluation for pleural effusion. EXAM: CT CHEST WITHOUT CONTRAST TECHNIQUE: Multidetector CT imaging of the chest was performed following the standard  protocol without IV contrast. COMPARISON:  Comparison made with prior radiograph from earlier same day. FINDINGS: Cardiovascular: Intrathoracic aorta up Ing great vessels not well evaluated on this noncontrast examination. Heart size likely within normal limits, but difficult to discern as well. There is a large circumferential pericardial effusion. Mediastinum/Nodes: Endotracheal tube in place. Tip is somewhat low lying at the carina, and projecting towards the right mainstem bronchus. Retraction by approximately 2 cm suggested. Enteric tube courses into the stomach. Thyroid grossly normal. Evaluation for mediastinal and hilar adenopathy fairly limited on this exam. Few scattered mildly prominent axillary lymph nodes measure up to the upper limits of normal. Esophagus not well evaluated. Lungs/Pleura: There is a large left pleural effusion involving much of the left lung. Probable slight left-to-right shift of the mediastinal structures. Associated atelectasis and/or consolidation within the left lung, with only small amount of aeration present within the left upper lobe. Smaller layering right pleural effusion. Right basilar atelectasis and/ or consolidation present as well within the right lower lobe. Additional patchy infiltrate within the posterior right upper lobe as well. No obvious pulmonary edema. No  pneumothorax. No appreciable pulmonary nodule or mass. Upper Abdomen: Partially visualized upper abdomen grossly unremarkable. Musculoskeletal: No acute osseous abnormality. No worrisome lytic or blastic osseous lesions. IMPRESSION: 1. Large left pleural effusion. Associated opacity throughout the left lung may reflect atelectasis and/or infiltrate. 2. Smaller right pleural effusion with associated right upper and lower lobe opacity, which also may reflect atelectasis or infiltrates. 3. Large circumferential pericardial effusion. 4. Tip of the endotracheal tube somewhat low lying at the carina, and directed  towards the right mainstem bronchus. Retraction by 2 cm is recommended. Critical Value/emergent results were called by telephone at the time of interpretation on 10/03/2016 at 10:42 pm to the critical care nurse taking care of the patient, Porfirio Oar, who verbally acknowledged these results. Electronically Signed   By: Jeannine Boga M.D.   On: 10/03/2016 22:45   US Renal  Result Date: 10/04/2016 CLINICAL DATA:  Renal failure EXAM: RENAL / URINARY TRACT ULTRASOUND COMPLETE COMPARISON:  None. FINDINGS: Right Kidney: Length: 12.9 cm. Echogenicity within normal limits. No mass or hydronephrosis visualized. Left Kidney: Length: 12.2 cm. Echogenicity within normal limits. No mass or hydronephrosis visualized. Bladder: Not well-visualized. Other: Small right pleural effusion.  Small volume ascites. IMPRESSION: Normal renal ultrasound.  No obstructive uropathy. Electronically Signed   By: Kathreen Devoid   On: 10/04/2016 16:03   Dg Chest Port 1 View  Result Date: 10/06/2016 CLINICAL DATA:  Acute respiratory failure. EXAM: PORTABLE CHEST 1 VIEW COMPARISON:  10/05/2016 and CT chest 10/03/2016. FINDINGS: Left IJ central line tip projects over the SVC. Interval extubation. Nasogastric has been removed. Cardiopericardial silhouette is enlarged, stable. Lungs are low in volume with mild diffuse airspace opacification and a moderate to large left pleural effusion. Left lower lobe collapse/consolidation. Findings are similar to yesterday's exam. IMPRESSION: 1. Left lower lobe collapse/consolidation, worrisome for pneumonia. 2. Enlarged cardiopericardial silhouette, shown to represent a large pericardial effusion on 10/03/2016. 3. Moderate to large left pleural effusion. 4. Mild diffuse bilateral airspace opacification may be due to low lung volumes and atelectasis. Difficult to exclude edema. Electronically Signed   By: Lorin Picket M.D.   On: 10/06/2016 07:36   Dg Chest Port 1 View  Result Date:  10/05/2016 CLINICAL DATA:  Endotracheal tube. EXAM: PORTABLE CHEST 1 VIEW COMPARISON:  10/04/2016 FINDINGS: Endotracheal tube terminates 3.2 cm above carina.Nasogastric tube extends beyond the inferior aspect of the film. Left-sided internal jugular line with tip at high SVC. Midline trachea. Cardiomegaly accentuated by AP portable technique. Layering left greater than right pleural effusions were likely similar. No pneumothorax. Interstitial and airspace disease is worse on the left. Slightly improved since the prior exam. IMPRESSION: Slightly improved appearance of interstitial and airspace disease which likely represents pulmonary edema. Infection or aspiration could look similar. Layering bilateral pleural effusions are felt to be similar. Electronically Signed   By: Abigail Miyamoto M.D.   On: 10/05/2016 09:08   Dg Chest Port 1 View  Result Date: 10/04/2016 CLINICAL DATA:  Evaluate central line placement. EXAM: PORTABLE CHEST 1 VIEW COMPARISON:  Oct 04, 2016 FINDINGS: The ETT terminates 2.4 cm above the carina in good position. A new left central line terminates in the SVC. No pneumothorax identified. Effusion and opacity remains on the left. Cardiomegaly. The right lung remains clear. No other interval changes. An NG tube terminates below today's film. IMPRESSION: The ETT is in good position. The new left central line is in good position with no pneumothorax. Effusion and opacity remains on the left. Recommend  follow-up to resolution. Electronically Signed   By: Dorise Bullion III M.D   On: 10/04/2016 14:21   Dg Chest Port 1 View  Result Date: 10/04/2016 CLINICAL DATA:  Respiratory failure EXAM: PORTABLE CHEST 1 VIEW COMPARISON:  10/03/2016 FINDINGS: Endotracheal tube as been retracted. The tip is 2.1 cm from the carina. NG tube is stable with its tip beyond the gastroesophageal junction. There is near complete opacification of the left hemithorax with some aeration at the left apex. This is stable. There  is also shift of the mediastinum to the right. The right lung is relatively clear. No pneumothorax. The cardiac silhouette remains enlarged secondary to the large known pericardial effusion. IMPRESSION: Stable large left pleural effusion and shift of the mediastinum to the right. Underlying consolidation throughout much of the left lung persists. Endotracheal tube retracted.  Tip is 2.1 cm the carina. Electronically Signed   By: Marybelle Killings M.D.   On: 10/04/2016 07:33   Dg Chest Port 1 View  Result Date: 10/03/2016 CLINICAL DATA:  Acute respiratory failure.  Intubation. EXAM: PORTABLE CHEST 1 VIEW COMPARISON:  10/03/2016 chest radiograph FINDINGS: Enlargement of the cardiopericardial silhouette identified. A large left pleural effusion and left lower lung atelectasis/consolidation again noted. An endotracheal tube is identified with tip 1.5 cm above the carina. An NG tube is noted entering the stomach. This is a low volume film. There is no evidence of pneumothorax. IMPRESSION: Endotracheal tube with tip 1.5 cm above the carina. Unchanged large left pleural effusion and left lower lung consolidation/atelectasis. Enlarged cardiopericardial silhouette. Electronically Signed   By: Margarette Canada M.D.   On: 10/03/2016 19:48   Dg Chest Port 1 View  Result Date: 10/03/2016 CLINICAL DATA:  Shortness of breath and fevers EXAM: PORTABLE CHEST 1 VIEW COMPARISON:  None. FINDINGS: Cardiac shadow is prominent. Large left-sided pleural effusion is noted. There is likely underlying infiltrate present. No bony abnormality is noted. The right lung is well aerated without focal infiltrate. IMPRESSION: Large left-sided pleural effusion with likely underlying infiltrate. Electronically Signed   By: Inez Catalina M.D.   On: 10/03/2016 14:04   Dg Abd Portable 1v  Result Date: 10/03/2016 CLINICAL DATA:  Orogastric tube placement EXAM: PORTABLE ABDOMEN - 1 VIEW COMPARISON:  Same day CXR FINDINGS: The bowel gas pattern is normal.  The tip and side port of a gastric tube is seen along the expected location greater curvature stomach. No radio-opaque calculi or other significant radiographic abnormality are seen. Bowel gas pattern is unremarkable. Obscuration of the left hemidiaphragm from known left pleural effusion is partially visualized. No acute osseous appearing abnormality. IMPRESSION: Orogastric tube tip and side-port in the expected location the stomach. Left pleural effusion accounting for obscuration of left hemidiaphragm. Electronically Signed   By: Ashley Royalty M.D.   On: 10/03/2016 21:48   Xr Ankle Complete Right  Result Date: 09/21/2016 3 views right ankle shows the talus well located within the ankle mortise no diastases. No acute fractures no bony abnormalities  Xr Foot Complete Right  Result Date: 09/21/2016 3 views right foot: No acute fracture. Hallux valgus deformity. Slight Morton's type foot with the second and third metatarsals being longer first. No bony abnormalities otherwise.  Xr Knee 1-2 Views Right  Result Date: 09/21/2016 Right knee AP lateral views: No acute fracture. Medial lateral joint lines are well maintained. Patellofemoral joint appears to be well maintained. No joint effusion.    Follow-up 2-D echo 5/23. Impressions:  - Compared to 05/70/2018, very large  circumferential pericardial   effusion with suggestion of tamponade physiology, very large left   pleural effusion is now markedly reduced. Fibrinous strands are   noted in the pericardial fluid suggestive of hemopericardium.  Filed Weights   10/08/16 0645 10/09/16 0602 10/10/16 0613  Weight: 125.8 kg (277 lb 6.4 oz) 125.9 kg (277 lb 9 oz) 118.9 kg (262 lb 3.2 oz)     Microbiology: Recent Results (from the past 240 hour(s))  Blood Culture (routine x 2)     Status: None   Collection Time: 10/03/16  1:39 PM  Result Value Ref Range Status   Specimen Description BLOOD RIGHT ANTECUBITAL  Final   Special Requests   Final     BOTTLES DRAWN AEROBIC AND ANAEROBIC Blood Culture adequate volume   Culture NO GROWTH 5 DAYS  Final   Report Status 10/08/2016 FINAL  Final  Blood Culture (routine x 2)     Status: None   Collection Time: 10/03/16  2:40 PM  Result Value Ref Range Status   Specimen Description BLOOD RIGHT HAND  Final   Special Requests   Final    BOTTLES DRAWN AEROBIC AND ANAEROBIC Blood Culture adequate volume   Culture NO GROWTH 5 DAYS  Final   Report Status 10/08/2016 FINAL  Final  Culture, respiratory (NON-Expectorated)     Status: None   Collection Time: 10/03/16  5:55 PM  Result Value Ref Range Status   Specimen Description TRACHEAL ASPIRATE  Final   Special Requests NONE  Final   Gram Stain   Final    MODERATE WBC PRESENT,BOTH PMN AND MONONUCLEAR FEW GRAM POSITIVE COCCI RARE GRAM POSITIVE RODS RARE GRAM NEGATIVE RODS    Culture Consistent with normal respiratory flora.  Final   Report Status 10/06/2016 FINAL  Final  MRSA PCR Screening     Status: None   Collection Time: 10/03/16  7:44 PM  Result Value Ref Range Status   MRSA by PCR NEGATIVE NEGATIVE Final    Comment:        The GeneXpert MRSA Assay (FDA approved for NASAL specimens only), is one component of a comprehensive MRSA colonization surveillance program. It is not intended to diagnose MRSA infection nor to guide or monitor treatment for MRSA infections.   Culture, Urine     Status: None   Collection Time: 10/03/16  8:34 PM  Result Value Ref Range Status   Specimen Description URINE, RANDOM  Final   Special Requests NONE  Final   Culture NO GROWTH  Final   Report Status 10/05/2016 FINAL  Final  Respiratory Panel by PCR     Status: None   Collection Time: 10/03/16  8:34 PM  Result Value Ref Range Status   Adenovirus NOT DETECTED NOT DETECTED Final   Coronavirus 229E NOT DETECTED NOT DETECTED Final   Coronavirus HKU1 NOT DETECTED NOT DETECTED Final   Coronavirus NL63 NOT DETECTED NOT DETECTED Final   Coronavirus OC43  NOT DETECTED NOT DETECTED Final   Metapneumovirus NOT DETECTED NOT DETECTED Final   Rhinovirus / Enterovirus NOT DETECTED NOT DETECTED Final   Influenza A NOT DETECTED NOT DETECTED Final   Influenza B NOT DETECTED NOT DETECTED Final   Parainfluenza Virus 1 NOT DETECTED NOT DETECTED Final   Parainfluenza Virus 2 NOT DETECTED NOT DETECTED Final   Parainfluenza Virus 3 NOT DETECTED NOT DETECTED Final   Parainfluenza Virus 4 NOT DETECTED NOT DETECTED Final   Respiratory Syncytial Virus NOT DETECTED NOT DETECTED Final   Bordetella pertussis  NOT DETECTED NOT DETECTED Final   Chlamydophila pneumoniae NOT DETECTED NOT DETECTED Final   Mycoplasma pneumoniae NOT DETECTED NOT DETECTED Final  Body fluid culture     Status: None   Collection Time: 10/04/16  2:01 PM  Result Value Ref Range Status   Specimen Description THORACENTESIS  Final   Special Requests Normal  Final   Gram Stain   Final    MODERATE WBC PRESENT,BOTH PMN AND MONONUCLEAR NO ORGANISMS SEEN    Culture NO GROWTH 3 DAYS  Final   Report Status 10/07/2016 FINAL  Final  Culture, body fluid-bottle     Status: None   Collection Time: 10/04/16  5:42 PM  Result Value Ref Range Status   Specimen Description FLUID PERICARDIAL  Final   Special Requests BOTTLES DRAWN AEROBIC AND ANAEROBIC 10CC  Final   Culture NO GROWTH 5 DAYS  Final   Report Status 10/09/2016 FINAL  Final  Gram stain     Status: None   Collection Time: 10/04/16  5:42 PM  Result Value Ref Range Status   Specimen Description FLUID PERICARDIAL  Final   Special Requests NONE  Final   Gram Stain   Final    ABUNDANT WBC PRESENT,BOTH PMN AND MONONUCLEAR NO ORGANISMS SEEN    Report Status 10/04/2016 FINAL  Final       Blood Culture    Component Value Date/Time   SDES FLUID PERICARDIAL 10/04/2016 1742   SDES FLUID PERICARDIAL 10/04/2016 1742   SPECREQUEST BOTTLES DRAWN AEROBIC AND ANAEROBIC 10CC 10/04/2016 1742   SPECREQUEST NONE 10/04/2016 1742   CULT NO GROWTH 5  DAYS 10/04/2016 1742   REPTSTATUS 10/09/2016 FINAL 10/04/2016 1742   REPTSTATUS 10/04/2016 FINAL 10/04/2016 1742      Labs: Results for orders placed or performed during the hospital encounter of 10/03/16 (from the past 48 hour(s))  Basic metabolic panel     Status: Abnormal   Collection Time: 10/09/16 11:45 PM  Result Value Ref Range   Sodium 138 135 - 145 mmol/L   Potassium 3.7 3.5 - 5.1 mmol/L   Chloride 113 (H) 101 - 111 mmol/L   CO2 21 (L) 22 - 32 mmol/L   Glucose, Bld 119 (H) 65 - 99 mg/dL   BUN 16 6 - 20 mg/dL   Creatinine, Ser 0.83 0.44 - 1.00 mg/dL   Calcium 7.9 (L) 8.9 - 10.3 mg/dL   GFR calc non Af Amer >60 >60 mL/min   GFR calc Af Amer >60 >60 mL/min    Comment: (NOTE) The eGFR has been calculated using the CKD EPI equation. This calculation has not been validated in all clinical situations. eGFR's persistently <60 mL/min signify possible Chronic Kidney Disease.    Anion gap 4 (L) 5 - 15  Creatinine, serum     Status: None   Collection Time: 10/10/16  4:27 AM  Result Value Ref Range   Creatinine, Ser 0.79 0.44 - 1.00 mg/dL   GFR calc non Af Amer >60 >60 mL/min   GFR calc Af Amer >60 >60 mL/min    Comment: (NOTE) The eGFR has been calculated using the CKD EPI equation. This calculation has not been validated in all clinical situations. eGFR's persistently <60 mL/min signify possible Chronic Kidney Disease.   CBC     Status: Abnormal   Collection Time: 10/10/16  4:27 AM  Result Value Ref Range   WBC 7.4 4.0 - 10.5 K/uL   RBC 3.10 (L) 3.87 - 5.11 MIL/uL   Hemoglobin 6.8 (LL)  12.0 - 15.0 g/dL    Comment: REPEATED TO VERIFY SPECIMEN CHECKED FOR CLOTS CRITICAL RESULT CALLED TO, READ BACK BY AND VERIFIED WITH: Dorena Bodo RN [id number] [id number] BY SHORTT    HCT 21.8 (L) 36.0 - 46.0 %   MCV 70.3 (L) 78.0 - 100.0 fL   MCH 22.3 (L) 26.0 - 34.0 pg   MCHC 31.7 30.0 - 36.0 g/dL   RDW 17.1 (H) 11.5 - 15.5 %   Platelets 278 150 - 400 K/uL  Type and screen [place]     Status: None   Collection Time: 10/10/16  5:55 AM  Result Value Ref Range   ABO/RH(D) O POS    Antibody Screen NEG    Sample Expiration 10/13/2016    Unit Number [id number]    Blood Component Type RED CELLS,LR    Unit division 00    Status of Unit ISSUED,FINAL    Transfusion Status OK TO TRANSFUSE    Crossmatch Result Compatible   Prepare RBC     Status: None   Collection Time: 10/10/16  5:55 AM  Result Value Ref Range   Order Confirmation ORDER PROCESSED BY BLOOD BANK   ABO/Rh     Status: None   Collection Time: 10/10/16  5:55 AM  Result Value Ref Range   ABO/RH(D) O POS   Glucose, capillary     Status: None   Collection Time: 10/10/16  7:45 AM  Result Value Ref Range   Glucose-Capillary 84 65 - 99 mg/dL  Comprehensive metabolic panel     Status: Abnormal   Collection Time: 10/11/16  5:31 AM  Result Value Ref Range   Sodium 141 135 - 145 mmol/L   Potassium 3.4 (L) 3.5 - 5.1 mmol/L   Chloride 113 (H) 101 - 111 mmol/L   CO2 23 22 - 32 mmol/L   Glucose, Bld 102 (H) 65 - 99 mg/dL   BUN 14 6 - 20 mg/dL   Creatinine, Ser 0.86 0.44 - 1.00 mg/dL   Calcium 8.1 (L) 8.9 - 10.3 mg/dL   Total Protein 5.3 (L) 6.5 - 8.1 g/dL   Albumin 2.1 (L) 3.5 - 5.0 g/dL   AST 17 15 - 41 U/L   ALT 18 14 - 54 U/L   Alkaline Phosphatase 45 38 - 126 U/L   Total Bilirubin 0.4 0.3 - 1.2 mg/dL   GFR calc non Af Amer >60 >60 mL/min   GFR calc Af Amer >60 >60 mL/min    Comment: (NOTE) The eGFR has been calculated using the CKD EPI equation. This calculation has not been validated in all clinical situations. eGFR's persistently <60 mL/min signify possible Chronic Kidney Disease.    Anion gap 5 5 - 15     Lipid Panel     Component Value Date/Time   CHOL 82 10/04/2016 1430   TRIG 139 10/03/2016 1928      HPI :  21 year old with history of Graves' disease on methimazole, cerebral atrophy. Brought to the ED by her mother with 3 days of worsening dyspnea. Found to have  temperature of 103 and a chest x-ray with large left pleural effusion, likely from severe community acquired pneumonia or viral illness. Also complains of increasing leg swelling for the past 2 weeks with redness, 1 episode of vomiting today. This was thought to be allergic reaction she was given 0.2 mg epinephrine, Solu-Medrol, albuterol and Atrovent by EMS. She  continued to have significant respiratory distress and was placed on BiPAP  and PCCM called for consultation. Noted to have elevated lactic acid, temperature of 103 with elevated WBC count.  She has a history of Graves' disease and cerebral atrophy. She moved a few weeks ago from [place], [place] and is establishing care here. There are no records in the system review. As per her mother she had a viral illness at age of 64 which caused cerebral atrophy.    .CT chest reviewed, showing pleural and pericardial effusion. She was found to have large left pleural effusion   as well as large pericardial effusion. She was  Intubated in ED and underwent left thoracocentesis for evacuation of 850 cc of cloudy exudative pleural effusion and on steroids by PCCM. 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   2D echo with EF 60-65% with severe circumferential effusion and she underwent pericardiocentesis of 870 cc of serosanguineous fluid suspected to be due to acute peritonitis v/s viral pericarditis. She was placed on colchicine with recommendations to continue X 3 months and follow up for repeat echo 1-2 days prior to discharge.  She tolerated extubation on  5/18 and noted to be debilitated with substantial deficits in mobility.    FOund to have abnormal Serologies 10/04/16:ANA 1:1280 Homogenous, 1:160 speckled, dsDNA > 300 Total complement 17, Smith ab >8-hence concerning for SLE vs Drug induced Lupus from Methimazole -Cultures negative, started on Prednisone, extubated Transferred  from PCCM to [place]:   Acute Resp failure -s/p VDRF, extubated 5/18 -see below, due to large pleural and pericardial effusion with early tamponade -improving, weaned off O2 -stopped IVF, appears volume overloaded, continue PO lasix today Plan is to go to CIR today  Large L pleural effusion -with L compressive atelectasis vs Pneumonia, clinically doubt pneumonia at this time -s/p L Thoracentesis 5/17; 820m of cloudypleural fluid was obtained -FLuid analysis is exudative, cultures negative -due to SLE vs Drug induced Lupus, see below -repeat CXR 5/21 with moderate L effusion, PCCM consulted, recommended to continue prednisone for now and monitor with serial CXR in 1 week, has FU with Dr.Mannam in office 5/29   Hold off on repeat thoracentesis until follow-up chest x-rays completed   Pericarditis/Large Pericardial effusion -with early tamponade on ECHO Cardiology Dr. 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 Abnormal Serologies 10/04/16: ANA 1:1280 Homogenous, 1:160 speckled, dsDNA > 300 Total complement 17, Anti Smith ab >8-hence concerning for SLE causing all of the above CCP antibody negative at 11, ANCA negative Cultures negative, started on Prednisone now 631mto 409maily now Methimazole has been discontinued Rheumatology referral made, called and discussed patient with Dr.Michaelyn Barterd faxed patients notes from today, they will call pt with FU, she will continue Prednisone 48m70mily until RHeum FU   Iron deficiency anemia, anemia of chronic disease, -due to acute illness/hemodilution and possibly low grade hemolysis from  SLE -baseline Hb 9.5-10, now 6.8, no signs of bleeding, scheduled to receive one unit of packed red blood cells -schistocytes noted in smear but Bili normal, haptoglobin normal, LDH normal-all this doesn't support hemolysis -anemia panel s/o IRon defi too Status post transfusion of one unit of packed red blood cells  on 5/23, consent obtained from the mother who is the POA   AKI, resolved -improving, stop IVF, renal US unremarkable   ? 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 rate 18, height 5' 5"  (1.651 m), weight 118.9 kg (262 lb 3.2 oz), last menstrual period 10/08/2016, SpO2 99 %.  HEENT: MM pink/moist, no JVD left neck dressing intact PUL:[id number] , smiling Neuro: Unsteady gait CV: s1s2 rrr, no m/r/g PULM: even/non-labored, lungs bilaterally decreased in bases CQ:PEAK, non-tender, bsx4 active  Extremities: warm/dry, ++edema     Follow-up Information    Marshell Garfinkel, MD Follow up on 10/16/2016.   Specialty:  Pulmonary Disease Why:  report at [place] for check in, then get chest-xray and see Dr Vaughan Browner at [facility] information: [address] [place] [address] [phone]        Gavin Pound, MD Follow up.   Specialty:  Rheumatology Why:  Office will call with Follow Up Contact information: [address] [place] [address] [phone]        [name], PA-C. Call.   Specialty:  Physician Assistant Why:  Hospital follow-up in 3-5 days after discharge Contact information: [place] [address] [phone]           Signed: Reyne Dumas 10/11/2016, 8:30 AM         Time spent >45 mins

## 2016-10-11 NOTE — Progress Notes (Signed)
I do not have an inpt rehab bed to admit pt to today, I do have a bed available tomorrow. I met with pt at bedside and then contacted her Mom by phone and she is in agreement. I will alert RN CM. I will follow up tomorrow. [phone]

## 2016-10-11 NOTE — Progress Notes (Signed)
Physical Therapy Treatment Patient Details Name: Erika FlatteryOmaure Mayol MRN: [id number] DOB: 08-05-1995 Today's Date: 10/11/2016    History of Present Illness 21 year old with history of Graves' disease on methimazole, cerebral atrophy. Brought to the ED by her mother with 3 days of worsening dyspnea. Found to have temperature of 103 and a chest x-ray with large left pleural effusion, likely from severe community acquired pneumonia or viral illness.    PT Comments    Pt was able to walk further in one bout today compared to yesterday, however, she still need min up to mod assist for balance and safe use of RW during gait.  She is easily distracted in a busy hallway looking into rooms and speaking with staff, however, with these head turns she becomes more off balanced, so cues to attend to her task at hand (walking).  She remains appropriate for CIR level therapies, but it looks like they do not have a bed available today, so PT will continue to follow acutely.   Follow Up Recommendations  CIR     Equipment Recommendations  None recommended by PT    Recommendations for Other Services Rehab consult     Precautions / Restrictions Precautions Precautions: Fall Restrictions Weight Bearing Restrictions: No    Mobility  Bed Mobility               General bed mobility comments: Pt seated EOB  Transfers Overall transfer level: Needs assistance Equipment used: Rolling walker (2 wheeled) Transfers: Sit to/from Stand Sit to Stand: Min assist         General transfer comment: Min assist for balance as she sometimes staggers immediately upon standing. Verbal cues for safe hand placement and to wait for PT to be ready  Ambulation/Gait Ambulation/Gait assistance: Min assist;Mod assist Ambulation Distance (Feet): 60 Feet Assistive device: Rolling walker (2 wheeled) Gait Pattern/deviations: Step-through pattern;Ataxic;Staggering left;Staggering right Gait velocity: decreased Gait  velocity interpretation: Below normal speed for age/gender General Gait Details: Pt needed up to mod assist for balance during gait with RW.  She likes to completely pick up RW when trying to get around obstacles or turning around and this is when she needs mod assist to prevent LOB.  Near constant verbal cues for safe RW use (closer proximity, keep all points on the ground, stand tall, look up, focus on walking and not looking in rooms while walking).           Balance Overall balance assessment: Needs assistance Sitting-balance support: Feet supported;No upper extremity supported Sitting balance-Leahy Scale: Good     Standing balance support: Bilateral upper extremity supported;No upper extremity supported;Single extremity supported Standing balance-Leahy Scale: Poor Standing balance comment: needs external assist dynamically to prevent falls.                             Cognition Arousal/Alertness: Awake/alert Behavior During Therapy: WFL for tasks assessed/performed;Impulsive Overall Cognitive Status: Impaired/Different from baseline Area of Impairment: Memory;Awareness;Safety/judgement;Problem solving                     Memory: Decreased short-term memory   Safety/Judgement: Decreased awareness of safety;Decreased awareness of deficits Awareness: Emergent Problem Solving: Slow processing;Difficulty sequencing;Requires verbal cues;Requires tactile cues General Comments: Pt did not remember walking with PT yesterday.  Decreased awareness of safety during gait and near constant cues for safety.              Pertinent Vitals/Pain Pain Assessment:  No/denies pain    Home Living     Available Help at Discharge:  (Mom has part time job 3 days per week)       Home Layout: 1/2 bath on main level;Two level;Able to live on main level with bedroom/bathroom        Prior Function            PT Goals (current goals can now be found in the care plan  section) Acute Rehab PT Goals Patient Stated Goal: independence Progress towards PT goals: Progressing toward goals    Frequency    Min 4X/week      PT Plan Current plan remains appropriate       AM-PAC PT "6 Clicks" Daily Activity  Outcome Measure  Difficulty turning over in bed (including adjusting bedclothes, sheets and blankets)?: A Little Difficulty moving from lying on back to sitting on the side of the bed? : A Little Difficulty sitting down on and standing up from a chair with arms (e.g., wheelchair, bedside commode, etc,.)?: Total Help needed moving to and from a bed to chair (including a wheelchair)?: A Little Help needed walking in hospital room?: A Lot Help needed climbing 3-5 steps with a railing? : A Lot 6 Click Score: 14    End of Session Equipment Utilized During Treatment: Gait belt Activity Tolerance: Patient limited by fatigue Patient left: in chair;with call bell/phone within reach;with chair alarm set;with family/visitor present   PT Visit Diagnosis: Muscle weakness (generalized) (M62.81);Other abnormalities of gait and mobility (R26.89)     Time: 5784-6962 PT Time Calculation (min) (ACUTE ONLY): 20 min  Charges:  $Gait Training: 8-22 mins          Allexa Acoff B. Mihailo Sage, PT, DPT [phone]            10/11/2016, 12:45 PM

## 2016-10-11 NOTE — PMR Pre-admission (Signed)
PMR Admission Coordinator Pre-Admission Assessment  Patient: Erika Boyer is an 21 y.o., female MRN: [id number] DOB: 1995-11-06 Height: 5\' 5"  (165.1 cm) Weight: 122.5 kg (270 lb)              Insurance Information HMO:     PPO:      PCP:      IPA:      80/20: yes     OTHER:  No HMO PRIMARY: Medicare a and b      Policy#: [id number]      Subscriber: pt Benefits:  Phone #: passport one online     Name: 10/11/2016 Eff. Date: 11/19/2015     Deduct: $1340      Out of Pocket Max: none      Life Max: none CIR: 100%      SNF: 20 full days Outpatient: 80%     Co-Pay: 20% Home Health: 100%      Co-Pay: none DME: 80%     Co-Pay: 20% Providers: pt choice  SECONDARY: Medicaid of [place]  Policy#: [id number]      Subscriber: pt Benefits:  Phone #: [phone]     Name: 10/11/16 Active 09/18/2016  Medicaid Application Date:       Case Manager:  Disability Application Date:       Case Worker:   Emergency Contact Information Contact Information    Name Relation Home Work Mobile   Kapler,Phyllis Mother   [phone]     Current Medical History  Patient Admitting Diagnosis: Debility  History of Present Illness: HPI: Erika Boyer is a 21 y.o. female with history of cerebral atrophy due to viral illness at age 21, MR with bouts of agitation, morbid obesity, Graves disease;  who was admitted on 10/03/16 with 3 day history of progressive SOB, BLE edema with erythema X 2 weeks and acute renal failure. CT chest reviewed, showing pleural and pericardial effusion. She was found to have large left pleural effusion due to HAP or viral illness as well as large pericardial effusion. She was  Intubated in ED and underwent left thoracocentesis for evacuation of 850 cc of cloudy exudative pleural effusion and on steroids by PCCM. Serologies positive with possibility of drug induced lupus and will need to be on steroids till seen by rheumatology.   2D echo with EF 60-65% with severe circumferential effusion and she  underwent pericardiocentesis of 870 cc of serosanguineous fluid suspected to be due to acute peritonitis v/s viral pericarditis. She was placed on colchicine with recommendations to continue X 3 months and follow up for repeat echo 1-2 days prior to discharge.  She tolerated extubation on  5/18 and noted to be debilitated with substantial deficits in mobility.   [place] and [place] a couple of months ago. Lives with mother and boyfriend there "most of the time". Mother manages home and provides supervision. She was independent--sedentary and watches TV most of the day. Was using walker "for safety" and when out of home recently.     Past Medical History  Past Medical History:  Diagnosis Date  . Anemia   . Anxiety   . Arthritis    "right knee" (10/11/2016)  . Cerebellar degeneration   . Chronic bronchitis (HCC)   . Daily headache   . Depression   . Graves disease 2012   hx; "was on RX; it disappeared" (10/11/2016)  . History of blood transfusion    "related to the anemia" (10/11/2016)  . Hypertension   .  Lupus    "not sure what kind" (10/11/2016)  . Pneumonia 10/03/2016    Family History  family history is not on file.  Prior Rehab/Hospitalizations:  Has the patient had major surgery during 100 days prior to admission? No  Current Medications   Current Facility-Administered Medications:  .  aspirin chewable tablet 81 mg, 81 mg, Oral, Daily, Mannam, Praveen, MD, 81 mg at 10/12/16 0936 .  Chlorhexidine Gluconate Cloth 2 % PADS 6 each, 6 each, Topical, Daily, Mannam, Praveen, MD, 6 each at 10/12/16 [phone] .  docusate (COLACE) 50 MG/5ML liquid 100 mg, 100 mg, Oral, BID PRN, Mannam, Praveen, MD .  enoxaparin (LOVENOX) injection 40 mg, 40 mg, Subcutaneous, Q24H, Junious Silk L, NP, 40 mg at 10/12/16 9604 .  furosemide (LASIX) tablet 20 mg, 20 mg, Oral, Daily, Zannie Cove, MD, 20 mg at 10/12/16 0936 .  metoprolol tartrate (LOPRESSOR) tablet 25 mg, 25 mg,  Oral, BID, Zannie Cove, MD, 25 mg at 10/12/16 0936 .  multivitamin (PROSIGHT) tablet 1 tablet, 1 tablet, Oral, Daily, Mannam, Praveen, MD, 1 tablet at 10/12/16 0936 .  ondansetron (ZOFRAN) injection 4 mg, 4 mg, Intravenous, Q6H PRN, Merwyn Katos, MD, 4 mg at 10/09/16 1248 .  pantoprazole (PROTONIX) EC tablet 40 mg, 40 mg, Oral, Q1200, Zannie Cove, MD, 40 mg at 10/11/16 1208 .  potassium chloride 20 MEQ/15ML (10%) solution 20 mEq, 20 mEq, Oral, BID, Richarda Overlie, MD, 20 mEq at 10/12/16 0936 .  predniSONE (DELTASONE) tablet 40 mg, 40 mg, Oral, Q breakfast, Zannie Cove, MD, 40 mg at 10/12/16 0936 .  sodium chloride flush (NS) 0.9 % injection 10-40 mL, 10-40 mL, Intracatheter, Q12H, Mannam, Praveen, MD, 10 mL at 10/11/16 0927 .  sodium chloride flush (NS) 0.9 % injection 10-40 mL, 10-40 mL, Intracatheter, PRN, Mannam, Praveen, MD  Patients Current Diet: Diet regular Room service appropriate? Yes; Fluid consistency: Thin Diet - low sodium heart healthy  Precautions / Restrictions Precautions Precautions: Fall Restrictions Weight Bearing Restrictions: No   Has the patient had 2 or more falls or a fall with injury in the past year?No  Prior Activity Level Limited Community (1-2x/wk): no drivers license; recent moe two months ago from [place] to [place] (sedentary per her Mom). Pt sedentary and Mom pushing her to be more active.Pt graduated [employer]. 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 States she has a short attention span. When Mom is present, Mom will ask her daughter to repeat what she just heard to make sure she understands. Pt then "parrots" what is said. Patient and Mom noticeably having issues with pt requesting more independence and Mom speaking for her. Pt states Mom will not let her make any decisions.  Mom states pt has had outpt therapy in the past with OT due to her hands dropping things, PT to assist with her  walking,. Pt with poor organizational skills, poor ability to write and so pt used computer in school to write. Pt does pay her own bills with her Mom's assistance.   Pt has a boyfriend since move to [facility] and he visits pt mainly when Mom not present, Mom is aware of his visiting.  Home Assistive Devices / Equipment Home Assistive Devices/Equipment: None Home Equipment: Walker - 2 wheels  Prior Device Use: Indicate devices/aids used by the patient prior to current illness, exacerbation or injury? Walker. Just began using RW over past few weeks pta due to weakness. Mom states  pt's walking had been good for several months pta.  Prior Functional Level Prior Function Level of Independence: Independent Comments: move from [place] 2 months ago; boyfriend from [place]  Self Care: Did the patient need help bathing, dressing, using the toilet or eating?  Independent  Indoor Mobility: Did the patient need assistance with walking from room to room (with or without device)? Independent  Stairs: Did the patient need assistance with internal or external stairs (with or without device)? Independent  Functional Cognition: Did the patient need help planning regular tasks such as shopping or remembering to take medications? Needed some help  Current Functional Level Cognition  Overall Cognitive Status: Impaired/Different from baseline Orientation Level: Oriented X4 Safety/Judgement: Decreased awareness of safety, Decreased awareness of deficits General Comments: Decreased awareness of safety during gait with RW and required continuous cues throughout. Difficulty sequencing with RW and easy distractibility during gait.     Extremity Assessment (includes Sensation/Coordination)  Upper Extremity Assessment: Generalized weakness  Lower Extremity Assessment: Generalized weakness    ADLs       Mobility  Overal bed mobility: Needs Assistance Bed Mobility: Supine to Sit Supine to sit: Supervision Sit to  supine: Min assist General bed mobility comments: In chair upon entry     Transfers  Overall transfer level: Needs assistance Equipment used: Rolling walker (2 wheeled) Transfers: Sit to/from Stand Sit to Stand: Min assist General transfer comment: Min A for steadying upon standing. Required safety cues to wait for PT to stand.     Ambulation / Gait / Stairs / Wheelchair Mobility  Ambulation/Gait Ambulation/Gait assistance: Min assist, Mod assist Ambulation Distance (Feet): 75 Feet Assistive device: Rolling walker (2 wheeled) Gait Pattern/deviations: Step-through pattern, Ataxic, Staggering left, Staggering right General Gait Details: Very unsteady gait with RW. Multiple safety cues for appropriate proximity and sequencing with RW. Ataxic gait, requiring up to mod A for steadying. Required verbal cues to stop and catch balance before continuing distance.  Gait velocity: decreased Gait velocity interpretation: Below normal speed for age/gender    Posture / Balance Dynamic Sitting Balance Sitting balance - Comments: pt able to maintain balance EOB but lack of trunk control noted in static sitting Balance Overall balance assessment: Needs assistance Sitting-balance support: Feet supported, No upper extremity supported Sitting balance-Leahy Scale: Good Sitting balance - Comments: pt able to maintain balance EOB but lack of trunk control noted in static sitting Standing balance support: Bilateral upper extremity supported, No upper extremity supported, Single extremity supported Standing balance-Leahy Scale: Poor Standing balance comment: needs external assist dynamically to prevent falls.     Special needs/care consideration BiPAP/CPAP  N/a CPM  N/a Continuous Drip IV  N/a Dialysis n/a Life Vest  N/a Oxygen  N/a Special Bed  N/a Trach Size  N/a Wound Vac n/a Skin excoriated areas to anterior aspects of BLE with edema noted                           Bowel mgmt:  Continent LBM  10/11/2016 Bladder mgmt:  continent Diabetic mgmt  N/a Decreased safety awareness; impulsive   Previous Home Environment Living Arrangements: Parent  Lives With:  (Mom) Available Help at Discharge: Family (Mom works partime 3 days per week) Type of Home: Apartment Home Layout: 1/2 bath on main level, Two level, Able to live on main level with bedroom/bathroom Alternate Level Stairs-Rails: Right, Left Alternate Level Stairs-Number of Steps: flight Home Access: Stairs to enter [employer] of Steps:  1 Bathroom Shower/Tub: Tub/shower unit, Curtain (pt feels she needs handicapped bars in bath) Bathroom Toilet: Standard Bathroom Accessibility: Yes How Accessible: Accessible via walker Home Care Services: No Additional Comments: Obtained RW after moving to [place], but only used in occassionally in the last few days prior to hospitalization.  Discharge Living Setting Plans for Discharge Living Setting: Lives with (comment) (Mom) Type of Home at Discharge: Apartment Discharge Home Layout: Two level, 1/2 bath on main level, Able to live on main level with bedroom/bathroom Alternate Level Stairs-Rails: Right, Left (two half rails on one side and full rail on other side) Alternate Level Stairs-Number of Steps: flight Discharge Home Access: Stairs to enter Entrance Stairs-Rails: None Entrance Stairs-Number of Steps: 1 Discharge Bathroom Shower/Tub: Tub/shower unit, Curtain (pt requests bars added to tub for safety) Discharge Bathroom Toilet: Standard Discharge Bathroom Accessibility: Yes How Accessible: Accessible via walker Does the patient have any problems obtaining your medications?: No  Social/Family/Support Systems Contact Information: Jamesetta So, Mom Anticipated Caregiver: Mom Anticipated Caregiver's Contact Information: see above Ability/Limitations of Caregiver: Mom works part time 3 days per week Caregiver Availability: Intermittent Discharge Plan Discussed with Primary  Caregiver: Yes Is Caregiver In Agreement with Plan?: Yes Does Caregiver/Family have Issues with Lodging/Transportation while Pt is in Rehab?: No  Patient has siblings who live in [place] area. Her Dad is from [place].  Goals/Additional Needs Patient/Family Goal for Rehab: Mod I to supervision with PT, OT, and SLP Expected length of stay: [facility] 7-11 days Special Service Needs: Pt with cognitive impairments at Baseline Pt/Family Agrees to Admission and willing to participate: Yes Program Orientation Provided & Reviewed with Pt/Caregiver Including Roles  & Responsibilities: Yes  Decrease burden of Care through IP rehab admission: n/a  Possible need for SNF placement upon discharge: not anticipated  Patient Condition: This patient's medical and functional status has changed since the consult dated: 10/08/2016 in which the Rehabilitation Physician determined and documented that the patient's condition is appropriate for intensive rehabilitative care in an inpatient rehabilitation facility. See "History of Present Illness" (above) for medical update. Functional changes are: overall min to mod assist. Patient's medical and functional status update has been discussed with the Rehabilitation physician and patient remains appropriate for inpatient rehabilitation. Will admit to inpatient rehab today.  Preadmission Screen Completed By:  Clois Dupes, 10/12/2016 10:48 AM ______________________________________________________________________   Discussed status with Dr. Riley Kill on 10/12/2016 at  1047 and received telephone approval for admission today.  Admission Coordinator:  Clois Dupes, time 1610 Date 10/12/2016.

## 2016-10-12 ENCOUNTER — Encounter (HOSPITAL_COMMUNITY): Payer: Self-pay | Admitting: Emergency Medicine

## 2016-10-12 ENCOUNTER — Encounter (HOSPITAL_COMMUNITY): Payer: Self-pay | Admitting: Physical Medicine and Rehabilitation

## 2016-10-12 ENCOUNTER — Inpatient Hospital Stay (HOSPITAL_COMMUNITY)
Admission: RE | Admit: 2016-10-12 | Discharge: 2016-10-25 | DRG: 948 | Disposition: A | Discharge status: Home Health | Payer: Medicare Other | Source: Intra-hospital | Attending: Physical Medicine & Rehabilitation | Admitting: Physical Medicine & Rehabilitation

## 2016-10-12 ENCOUNTER — Inpatient Hospital Stay (HOSPITAL_COMMUNITY): Payer: Medicare Other

## 2016-10-12 DIAGNOSIS — E876 Hypokalemia: Secondary | ICD-10-CM | POA: Diagnosis not present

## 2016-10-12 DIAGNOSIS — R0602 Shortness of breath: Secondary | ICD-10-CM

## 2016-10-12 DIAGNOSIS — L932 Other local lupus erythematosus: Secondary | ICD-10-CM | POA: Diagnosis present

## 2016-10-12 DIAGNOSIS — N179 Acute kidney failure, unspecified: Secondary | ICD-10-CM | POA: Diagnosis present

## 2016-10-12 DIAGNOSIS — D509 Iron deficiency anemia, unspecified: Secondary | ICD-10-CM | POA: Diagnosis present

## 2016-10-12 DIAGNOSIS — Z888 Allergy status to other drugs, medicaments and biological substances status: Secondary | ICD-10-CM

## 2016-10-12 DIAGNOSIS — I509 Heart failure, unspecified: Secondary | ICD-10-CM | POA: Diagnosis present

## 2016-10-12 DIAGNOSIS — R000 Tachycardia, unspecified: Secondary | ICD-10-CM | POA: Diagnosis present

## 2016-10-12 DIAGNOSIS — T50905A Adverse effect of unspecified drugs, medicaments and biological substances, initial encounter: Secondary | ICD-10-CM

## 2016-10-12 DIAGNOSIS — F09 Unspecified mental disorder due to known physiological condition: Secondary | ICD-10-CM | POA: Diagnosis present

## 2016-10-12 DIAGNOSIS — Z79899 Other long term (current) drug therapy: Secondary | ICD-10-CM | POA: Diagnosis not present

## 2016-10-12 DIAGNOSIS — Z885 Allergy status to narcotic agent status: Secondary | ICD-10-CM

## 2016-10-12 DIAGNOSIS — Z6838 Body mass index (BMI) 38.0-38.9, adult: Secondary | ICD-10-CM | POA: Diagnosis not present

## 2016-10-12 DIAGNOSIS — I313 Pericardial effusion (noninflammatory): Secondary | ICD-10-CM

## 2016-10-12 DIAGNOSIS — R0989 Other specified symptoms and signs involving the circulatory and respiratory systems: Secondary | ICD-10-CM | POA: Diagnosis not present

## 2016-10-12 DIAGNOSIS — M1711 Unilateral primary osteoarthritis, right knee: Secondary | ICD-10-CM | POA: Diagnosis present

## 2016-10-12 DIAGNOSIS — F419 Anxiety disorder, unspecified: Secondary | ICD-10-CM | POA: Diagnosis present

## 2016-10-12 DIAGNOSIS — E0590 Thyrotoxicosis, unspecified without thyrotoxic crisis or storm: Secondary | ICD-10-CM | POA: Diagnosis present

## 2016-10-12 DIAGNOSIS — R5381 Other malaise: Principal | ICD-10-CM

## 2016-10-12 DIAGNOSIS — Z91013 Allergy to seafood: Secondary | ICD-10-CM

## 2016-10-12 DIAGNOSIS — M329 Systemic lupus erythematosus, unspecified: Secondary | ICD-10-CM | POA: Diagnosis not present

## 2016-10-12 DIAGNOSIS — M320 Drug-induced systemic lupus erythematosus: Secondary | ICD-10-CM

## 2016-10-12 DIAGNOSIS — J90 Pleural effusion, not elsewhere classified: Secondary | ICD-10-CM

## 2016-10-12 DIAGNOSIS — J189 Pneumonia, unspecified organism: Secondary | ICD-10-CM | POA: Diagnosis not present

## 2016-10-12 DIAGNOSIS — R600 Localized edema: Secondary | ICD-10-CM | POA: Diagnosis not present

## 2016-10-12 DIAGNOSIS — Z6841 Body Mass Index (BMI) 40.0 and over, adult: Secondary | ICD-10-CM

## 2016-10-12 DIAGNOSIS — I110 Hypertensive heart disease with heart failure: Secondary | ICD-10-CM | POA: Diagnosis present

## 2016-10-12 DIAGNOSIS — I10 Essential (primary) hypertension: Secondary | ICD-10-CM | POA: Diagnosis not present

## 2016-10-12 DIAGNOSIS — N289 Disorder of kidney and ureter, unspecified: Secondary | ICD-10-CM | POA: Diagnosis not present

## 2016-10-12 DIAGNOSIS — R05 Cough: Secondary | ICD-10-CM | POA: Diagnosis not present

## 2016-10-12 MED ORDER — ENOXAPARIN SODIUM 40 MG/0.4ML ~~LOC~~ SOLN: 40.0000 mg | SUBCUTANEOUS | Status: DC

## 2016-10-12 MED ORDER — DIPHENHYDRAMINE HCL 12.5 MG/5ML PO ELIX: 12.5000 mg | ORAL_SOLUTION | Freq: Four times a day (QID) | ORAL | Status: DC | PRN

## 2016-10-12 MED ORDER — ASPIRIN 81 MG PO CHEW
81.0000 mg | CHEWABLE_TABLET | Freq: Every day | ORAL | Status: DC
  Administered 2016-10-13 – 2016-10-25 (×13): 81 mg via ORAL
  Filled 2016-10-12 (×13): qty 1

## 2016-10-12 MED ORDER — POLYETHYLENE GLYCOL 3350 17 G PO PACK: 17.0000 g | PACK | Freq: Every day | ORAL | Status: DC | PRN

## 2016-10-12 MED ORDER — POLYSACCHARIDE IRON COMPLEX 150 MG PO CAPS
150.0000 mg | ORAL_CAPSULE | Freq: Two times a day (BID) | ORAL | Status: DC
  Administered 2016-10-13 – 2016-10-24 (×23): 150 mg via ORAL
  Filled 2016-10-12 (×23): qty 1

## 2016-10-12 MED ORDER — PIPERACILLIN-TAZOBACTAM 3.375 G IVPB
3.3750 g | Freq: Three times a day (TID) | INTRAVENOUS | Status: DC
  Administered 2016-10-13 – 2016-10-16 (×12): 3.375 g via INTRAVENOUS
  Filled 2016-10-12 (×13): qty 50

## 2016-10-12 MED ORDER — IPRATROPIUM-ALBUTEROL 0.5-2.5 (3) MG/3ML IN SOLN
3.0000 mL | RESPIRATORY_TRACT | Status: DC | PRN
  Administered 2016-10-12: 3 mL via RESPIRATORY_TRACT
  Filled 2016-10-12: qty 3

## 2016-10-12 MED ORDER — PREDNISONE 20 MG PO TABS
40.0000 mg | ORAL_TABLET | Freq: Every day | ORAL | Status: DC
  Administered 2016-10-13 – 2016-10-19 (×7): 40 mg via ORAL
  Filled 2016-10-12 (×7): qty 2

## 2016-10-12 MED ORDER — PROCHLORPERAZINE 25 MG RE SUPP: 12.5000 mg | Freq: Four times a day (QID) | RECTAL | Status: DC | PRN

## 2016-10-12 MED ORDER — ALBUTEROL SULFATE (2.5 MG/3ML) 0.083% IN NEBU: 2.5000 mg | INHALATION_SOLUTION | RESPIRATORY_TRACT | Status: DC | PRN

## 2016-10-12 MED ORDER — METOPROLOL TARTRATE 25 MG PO TABS
25.0000 mg | ORAL_TABLET | Freq: Two times a day (BID) | ORAL | Status: DC
  Administered 2016-10-12 – 2016-10-15 (×6): 25 mg via ORAL
  Filled 2016-10-12 (×6): qty 1

## 2016-10-12 MED ORDER — GUAIFENESIN-DM 100-10 MG/5ML PO SYRP: 5.0000 mL | ORAL_SOLUTION | Freq: Four times a day (QID) | ORAL | Status: DC | PRN

## 2016-10-12 MED ORDER — BISACODYL 10 MG RE SUPP: 10.0000 mg | Freq: Every day | RECTAL | Status: DC | PRN

## 2016-10-12 MED ORDER — PROCHLORPERAZINE EDISYLATE 5 MG/ML IJ SOLN
5.0000 mg | Freq: Four times a day (QID) | INTRAMUSCULAR | Status: DC | PRN
  Administered 2016-10-15: 10 mg via INTRAMUSCULAR
  Filled 2016-10-12: qty 2

## 2016-10-12 MED ORDER — FUROSEMIDE 20 MG PO TABS
20.0000 mg | ORAL_TABLET | Freq: Every day | ORAL | Status: DC
  Administered 2016-10-13: 20 mg via ORAL
  Filled 2016-10-12: qty 1

## 2016-10-12 MED ORDER — VANCOMYCIN HCL 10 G IV SOLR
2000.0000 mg | Freq: Once | INTRAVENOUS | Status: AC
  Administered 2016-10-12: 2000 mg via INTRAVENOUS
  Filled 2016-10-12: qty 2000

## 2016-10-12 MED ORDER — SIMETHICONE 80 MG PO CHEW
80.0000 mg | CHEWABLE_TABLET | Freq: Four times a day (QID) | ORAL | Status: DC | PRN
  Administered 2016-10-12: 80 mg via ORAL
  Filled 2016-10-12: qty 1

## 2016-10-12 MED ORDER — ALBUTEROL SULFATE HFA 108 (90 BASE) MCG/ACT IN AERS: 2.0000 | INHALATION_SPRAY | RESPIRATORY_TRACT | Status: DC | PRN

## 2016-10-12 MED ORDER — VANCOMYCIN HCL 10 G IV SOLR
1250.0000 mg | Freq: Three times a day (TID) | INTRAVENOUS | Status: DC
  Administered 2016-10-13 (×2): 1250 mg via INTRAVENOUS
  Filled 2016-10-12 (×4): qty 1250

## 2016-10-12 MED ORDER — FLEET ENEMA 7-19 GM/118ML RE ENEM: 1.0000 | ENEMA | Freq: Once | RECTAL | Status: DC | PRN

## 2016-10-12 MED ORDER — FUROSEMIDE 10 MG/ML IJ SOLN
20.0000 mg | Freq: Once | INTRAMUSCULAR | Status: AC
  Administered 2016-10-12: 20 mg via INTRAVENOUS
  Filled 2016-10-12: qty 2

## 2016-10-12 MED ORDER — PROCHLORPERAZINE MALEATE 5 MG PO TABS
5.0000 mg | ORAL_TABLET | Freq: Four times a day (QID) | ORAL | Status: DC | PRN
  Administered 2016-10-13: 5 mg via ORAL
  Administered 2016-10-25: 10 mg via ORAL
  Filled 2016-10-12: qty 2
  Filled 2016-10-12: qty 1

## 2016-10-12 MED ORDER — POTASSIUM CHLORIDE 20 MEQ/15ML (10%) PO SOLN
20.0000 meq | Freq: Two times a day (BID) | ORAL | Status: DC
  Administered 2016-10-12 – 2016-10-13 (×2): 20 meq via ORAL
  Filled 2016-10-12 (×2): qty 15

## 2016-10-12 MED ORDER — ACETAMINOPHEN 325 MG PO TABS
325.0000 mg | ORAL_TABLET | ORAL | Status: DC | PRN
Start: 2016-10-12 — End: 2016-10-25
  Administered 2016-10-12: 325 mg via ORAL
  Administered 2016-10-13 – 2016-10-24 (×5): 650 mg via ORAL
  Filled 2016-10-12 (×8): qty 2

## 2016-10-12 MED ORDER — PROSIGHT PO TABS
1.0000 | ORAL_TABLET | Freq: Every day | ORAL | Status: DC
  Administered 2016-10-13 – 2016-10-19 (×7): 1 via ORAL
  Filled 2016-10-12 (×8): qty 1

## 2016-10-12 MED ORDER — TRAZODONE HCL 50 MG PO TABS
25.0000 mg | ORAL_TABLET | Freq: Every evening | ORAL | Status: DC | PRN
  Administered 2016-10-12 – 2016-10-15 (×2): 50 mg via ORAL
  Filled 2016-10-12 (×2): qty 1

## 2016-10-12 MED ORDER — FAMOTIDINE 20 MG PO TABS
20.0000 mg | ORAL_TABLET | Freq: Two times a day (BID) | ORAL | Status: DC
  Administered 2016-10-12 – 2016-10-25 (×26): 20 mg via ORAL
  Filled 2016-10-12 (×26): qty 1

## 2016-10-12 MED ORDER — ALUM & MAG HYDROXIDE-SIMETH 200-200-20 MG/5ML PO SUSP: 30.0000 mL | ORAL | Status: DC | PRN

## 2016-10-12 MED ORDER — ENOXAPARIN SODIUM 40 MG/0.4ML ~~LOC~~ SOLN
40.0000 mg | SUBCUTANEOUS | Status: DC
  Administered 2016-10-13 – 2016-10-25 (×13): 40 mg via SUBCUTANEOUS
  Filled 2016-10-12 (×12): qty 0.4

## 2016-10-12 MED ORDER — LORAZEPAM 0.5 MG PO TABS
0.5000 mg | ORAL_TABLET | Freq: Two times a day (BID) | ORAL | Status: DC | PRN
  Administered 2016-10-12: 0.5 mg via ORAL
  Filled 2016-10-12: qty 1

## 2016-10-12 NOTE — Discharge Instructions (Signed)
Community-Acquired Pneumonia, Adult °Pneumonia is an infection of the lungs. One type of pneumonia can happen while a person is in a hospital. A different type can happen when a person is not in a hospital (community-acquired pneumonia). It is easy for this kind to spread from person to person. It can spread to you if you breathe near an infected person who coughs or sneezes. Some symptoms include: °· A dry cough. °· A wet (productive) cough. °· Fever. °· Sweating. °· Chest pain. °Follow these instructions at home: °· Take over-the-counter and prescription medicines only as told by your doctor. °¨ Only take cough medicine if you are losing sleep. °¨ If you were prescribed an antibiotic medicine, take it as told by your doctor. Do not stop taking the antibiotic even if you start to feel better. °· Sleep with your head and neck raised (elevated). You can do this by putting a few pillows under your head, or you can sleep in a recliner. °· Do not use tobacco products. These include cigarettes, chewing tobacco, and e-cigarettes. If you need help quitting, ask your doctor. °· Drink enough water to keep your pee (urine) clear or pale yellow. °A shot (vaccine) can help prevent pneumonia. Shots are often suggested for: °· People older than 21 years of age. °· People older than 21 years of age: °¨ Who are having cancer treatment. °¨ Who have long-term (chronic) lung disease. °¨ Who have problems with their body's defense system (immune system). °You may also prevent pneumonia if you take these actions: °· Get the flu (influenza) shot every year. °· Go to the dentist as often as told. °· Wash your hands often. If soap and water are not available, use hand sanitizer. °Contact a doctor if: °· You have a fever. °· You lose sleep because your cough medicine does not help. °Get help right away if: °· You are short of breath and it gets worse. °· You have more chest pain. °· Your sickness gets worse. This is very serious if: °¨ You  are an older adult. °¨ Your body's defense system is weak. °· You cough up blood. °This information is not intended to replace advice given to you by your health care provider. Make sure you discuss any questions you have with your health care provider. °Document Released: 10/24/2007 Document Revised: 10/13/2015 Document Reviewed: 09/01/2014 °Elsevier Interactive Patient Education © 2017 [employer] ° °

## 2016-10-12 NOTE — Progress Notes (Signed)
Marcello Fennel, MD Physician Signed Physical Medicine and Rehabilitation  Consult Note Date of Service: 10/08/2016 10:10 AM  Related encounter: ED to Hosp-Admission (Current) from 10/03/2016 in [facility] 5W MEDICAL     Expand All Collapse All   [] Hide copied text [] Hover for attribution information      Physical Medicine and Rehabilitation Consult  Reason for Consult:  Debility.  Referring Physician: Dr. Jomarie Longs.    HPI: Erika Boyer is a 21 y.o. 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 CT chest reviewed, showing pleural and pericardial effusion. She was found to have large left pleural effusion due to HAP or viral illness as well as large pericardial effusion. She was  Intubated in ED and underwent left thoracocentesis for evacuation of 850 cc of cloudy exudative pleural effusion and on steroids by PCCM. 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   2D echo with EF 60-65% with severe circumferential effusion and she underwent pericardiocentesis of 870 cc of serosanguineous fluid suspected to be due to acute peritonitis v/s viral pericarditis. She was placed on colchicine with recommendations to continue X 3 months and follow up for repeat echo 1-2 days prior to discharge.  She tolerated extubation on  5/18 and noted to be debilitated with substantial deficits in mobility. CIR recommended for follow up therapy.   [place] and moved from [place] a couple of months ago per boyfriend. Lives with mother and boyfriend there "most of the time". Mother manages home and provides supervision. She was independent--sedentary and watches TV most of the day. Was using walker  "for safety" and when out of home recently.     Review of Systems  HENT: Negative for hearing loss and tinnitus.   Eyes: Positive for blurred vision.  Respiratory: Positive for shortness of breath.   Cardiovascular: Negative for chest pain and palpitations.  Gastrointestinal: Positive for abdominal pain. Negative for heartburn and nausea.  Genitourinary: Negative for dysuria.  Musculoskeletal: Positive for back pain, joint pain (right knee pain due to fall earlier this year. ) and myalgias.  Neurological: Positive for weakness. Negative for dizziness, speech change, focal weakness and headaches.  Psychiatric/Behavioral: The patient is not nervous/anxious.   All other systems reviewed and are negative.         Past Medical History:  Diagnosis Date  . Cerebellar degeneration   . Graves disease 2012         Past Surgical History:  Procedure Laterality Date  . BREAST REDUCTION SURGERY    . PERICARDIOCENTESIS N/A 10/04/2016   Procedure: Pericardiocentesis;  Surgeon: Yates Decamp, MD;  Location: [facility] INVASIVE CV LAB;  Service: Cardiovascular;  Laterality: N/A;  . TONSILLECTOMY      Family Hx:  Unknown not in the room.     Social History:  Lives with mother. Disabled. She reports that she has never smoked. She has never used smokeless tobacco. Her alcohol and drug histories are not on file.         Allergies  Allergen Reactions  . Shellfish Allergy Anaphylaxis  . Morphine And Related Itching  . Mushroom Extract Complex Other (See Comments)    Mother is allergic, so patient avoids  these  . Tapazole [Methimazole] Swelling and Rash    WELTS (also) Mother suspects this MAY be an allergy, as the onset of symptoms coincided with this being started          Medications Prior to Admission  Medication Sig Dispense Refill  . albuterol (PROAIR HFA) 108 (90 Base) MCG/ACT inhaler Inhale 2 puffs into the lungs every 4 (four) hours as needed for wheezing or shortness  of breath.    . EPINEPHrine (EPIPEN 2-PAK) 0.3 mg/0.3 mL IJ SOAJ injection Inject 0.3 mg into the muscle once as needed (for anaphylaxis).     Marland Kitchen ibuprofen (ADVIL,MOTRIN) 600 MG tablet Take 600 mg by mouth every 6 (six) hours as needed (for pain).    . methimazole (TAPAZOLE) 10 MG tablet Take 1.5 tablets (15 mg total) by mouth daily. 45 tablet 2    Home: Home Living Family/patient expects to be discharged to:: Private residence Living Arrangements: Parent Available Help at Discharge: Family, Available 24 hours/day Type of Home: Apartment Home Access: Stairs to enter [employer] of Steps: 1 Home Layout: Two level, Bed/bath upstairs Alternate Level Stairs-Number of Steps: flight Bathroom Toilet: Standard Home Equipment: Walker - 2 wheels Additional Comments: Obtained RW after moving to [place], but only used in occassionally in the last few days prior to hospitalization.  Functional History: Prior Function Level of Independence: Independent Comments: Prior to moving to [place] several weeks ago. Pt was active, worked out at [employer]  Functional Status:  Mobility: Bed Mobility Overal bed mobility: Needs Assistance Bed Mobility: Supine to Sit, Sit to Supine Supine to sit: Min assist Sit to supine: Min assist Transfers Overall transfer level: Needs assistance Equipment used: Rolling walker (2 wheeled) Transfers: Sit to/from Stand Sit to Stand: Min assist General transfer comment: Sit to stand x 3 trial for strengthening. Ambulation/Gait Ambulation/Gait assistance: Min assist Ambulation Distance (Feet): 5 Feet Assistive device: Rolling walker (2 wheeled) Gait Pattern/deviations: Step-through pattern, Decreased stride length, Wide base of support General Gait Details: decreased BLE strength resulting in unsteady gait with wide BOS. Pt on RA with sats 93% and HR 125. Gait velocity: decreased Gait velocity interpretation: Below normal speed for  age/gender  ADL:  Cognition: Cognition Overall Cognitive Status: Within Functional Limits for tasks assessed Orientation Level: Oriented X4 Cognition Arousal/Alertness: Awake/alert Behavior During Therapy: WFL for tasks assessed/performed Overall Cognitive Status: Within Functional Limits for tasks assessed   Blood pressure (!) 142/100, pulse (!) 112, temperature 99.8 F (37.7 C), temperature source Oral, resp. rate 18, height 5\' 5"  (1.651 m), weight 125.8 kg (277 lb 6.4 oz), last menstrual period 10/08/2016, SpO2 95 %. Physical Exam  Nursing note and vitals reviewed. Constitutional: She is oriented to person, place, and time. She appears well-developed. She is cooperative.  Obese  HENT:  Head: Normocephalic and atraumatic.  Eyes: Conjunctivae and EOM are normal. Pupils are equal, round, and reactive to light.  Neck: Normal range of motion. Neck supple.  Cardiovascular: Regular rhythm.  Tachycardia present.   Respiratory: No accessory muscle usage or stridor. No respiratory distress. She has decreased breath sounds in the left middle field and the left lower field.  Audible wheeze with minimal exertion.  Increased WOB  GI: Soft. Bowel sounds are normal. She exhibits no distension. There is no tenderness.  Musculoskeletal: She exhibits edema. She exhibits no tenderness.  2 + edema BLE  Neurological: She is alert and oriented to person, place, and time.  Speech slow but clear.  Able to answer orientation  questions and recall today's date with increased time.  Able to follow simple one and two step motor commands. Intentional tremors BUE with mild ataxia.  Motor: 4/5 grossly throughout Sensation intact to light touch  Skin: Skin is warm and dry.  Psychiatric: Her affect is blunt. Her speech is delayed. She is slowed.    Lab Results Last 24 Hours       Results for orders placed or performed during the hospital encounter of 10/03/16 (from the past 24 hour(s))  CBC      Status: Abnormal   Collection Time: 10/08/16  5:43 AM  Result Value Ref Range   WBC 8.4 4.0 - 10.5 K/uL   RBC 3.34 (L) 3.87 - 5.11 MIL/uL   Hemoglobin 7.3 (L) 12.0 - 15.0 g/dL   HCT 16.123.3 (L) 09.636.0 - 04.546.0 %   MCV 69.8 (L) 78.0 - 100.0 fL   MCH 21.9 (L) 26.0 - 34.0 pg   MCHC 31.3 30.0 - 36.0 g/dL   RDW 40.915.9 (H) 81.111.5 - 91.415.5 %   Platelets 233 150 - 400 K/uL  Basic metabolic panel     Status: Abnormal   Collection Time: 10/08/16  5:43 AM  Result Value Ref Range   Sodium 139 135 - 145 mmol/L   Potassium 3.3 (L) 3.5 - 5.1 mmol/L   Chloride 115 (H) 101 - 111 mmol/L   CO2 20 (L) 22 - 32 mmol/L   Glucose, Bld 100 (H) 65 - 99 mg/dL   BUN 18 6 - 20 mg/dL   Creatinine, Ser 7.820.88 0.44 - 1.00 mg/dL   Calcium 8.0 (L) 8.9 - 10.3 mg/dL   GFR calc non Af Amer >60 >60 mL/min   GFR calc Af Amer >60 >60 mL/min   Anion gap 4 (L) 5 - 15      Imaging Results (Last 48 hours)  Dg Chest 2 View  Result Date: 10/08/2016 CLINICAL DATA:  Shortness of breath and weakness EXAM: CHEST  2 VIEW COMPARISON:  Two days prior FINDINGS: Cardiopericardial enlargement that is similar to priors. There has been pericardiocentesis per EMR. Moderate left pleural effusion tracking laterally. The underlying left lower lobe is opacified, nonspecific between pneumonia and atelectasis. No pneumothorax. IMPRESSION: 1. Unchanged moderate left pleural effusion and opacified left lower lobe. 2. Persistent cardiopericardial enlargement despite pericardiocentesis. Electronically Signed   By: Marnee SpringJonathon  Watts M.D.   On: 10/08/2016 08:57     Assessment/Plan: Diagnosis: Debility Labs and imaging independently reviewed.  Records reviewed and summated above.  1. Does the need for close, 24 hr/day medical supervision in concert with the patient's rehab needs make it unreasonable for this patient to be served in a less intensive setting? Yes 2. 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 Due to safety, disease management, medication administration and patient education, does the patient require 24 hr/day rehab nursing? Yes 4. Does the patient require coordinated care of a physician, rehab nurse, PT (1-2 hrs/day, 5 days/week), OT (1-2 hrs/day, 5 days/week) and SLP (1-2 hrs/day, 5 days/week) to address physical and functional deficits in the context  of the above medical diagnosis(es)? Yes Addressing deficits in the following areas: balance, endurance, locomotion, strength, transferring, bathing, dressing, toileting and psychosocial support 5. Can the patient actively participate in an intensive therapy program of at least 3 hrs of therapy per day at least 5 days per week? Potentially 6. The potential for patient to make measurable gains while on inpatient rehab is good 7. Anticipated functional outcomes upon discharge from inpatient rehab are supervision  with PT, supervision with OT, supervision with SLP. 8. Estimated rehab length of stay to reach the above functional goals is: 7-11 days. 9. Anticipated D/C setting: Home 10. Anticipated post D/C treatments: HH therapy and Home excercise program 11. Overall Rehab/Functional Prognosis: good  RECOMMENDATIONS: This patient's condition is appropriate for continued rehabilitative care in the following setting: Will cont to follow. Pt currently unable to tolerate 3 hours therapy/day and may progress quickly.   Patient has agreed to participate in recommended program. Potentially Note that insurance prior authorization may be required for reimbursement for recommended care.  Comment: Rehab Admissions Coordinator to follow up.  Maryla Morrow, MD, [place] Dom Jacquelynn Cree, [place] 10/08/2016    Revision History                        Routing History

## 2016-10-12 NOTE — Progress Notes (Signed)
Patient being discharged to CIR  Results of 2-D echo reviewed with the patient's mother/POA

## 2016-10-12 NOTE — Progress Notes (Signed)
Physical Therapy Treatment Patient Details Name: Erika Boyer MRN: [id number] DOB: [birth year] Today's Date: 10/12/2016    History of Present Illness 21 year old with history of Graves' disease on methimazole, cerebral atrophy. Brought to the ED by her mother with 3 days of worsening dyspnea. Found to have temperature of 103 and a chest x-ray with large left pleural effusion, likely from severe community acquired pneumonia or viral illness.    PT Comments    Pt progressing towards goals, however, continues to remain very unsteady during gait requiring up to mod A to maintain balance. Pt also continues to demonstrate decreased safety awareness and requires continuous safety cues throughout session to ensure safety with ambulation. Pt continues to be appropriate for CIR to maximize functional mobility independence. Will continue to follow acutely.   Follow Up Recommendations  CIR     Equipment Recommendations  None recommended by PT    Recommendations for Other Services Rehab consult     Precautions / Restrictions Precautions Precautions: Fall Restrictions Weight Bearing Restrictions: No    Mobility  Bed Mobility               General bed mobility comments: In chair upon entry   Transfers Overall transfer level: Needs assistance Equipment used: Rolling walker (2 wheeled) Transfers: Sit to/from Stand Sit to Stand: Min assist         General transfer comment: Min A for steadying upon standing. Required safety cues to wait for PT to stand.   Ambulation/Gait Ambulation/Gait assistance: Min assist;Mod assist Ambulation Distance (Feet): 75 Feet Assistive device: Rolling walker (2 wheeled) Gait Pattern/deviations: Step-through pattern;Ataxic;Staggering left;Staggering right Gait velocity: decreased Gait velocity interpretation: Below normal speed for age/gender General Gait Details: Very unsteady gait with RW. Multiple safety cues for appropriate proximity and  sequencing with RW. Ataxic gait, requiring up to mod A for steadying. Required verbal cues to stop and catch balance before continuing distance.    Stairs            Wheelchair Mobility    Modified Rankin (Stroke Patients Only)       Balance Overall balance assessment: Needs assistance Sitting-balance support: Feet supported;No upper extremity supported Sitting balance-Leahy Scale: Good     Standing balance support: Bilateral upper extremity supported;No upper extremity supported;Single extremity supported Standing balance-Leahy Scale: Poor Standing balance comment: needs external assist dynamically to prevent falls.                             Cognition Arousal/Alertness: Awake/alert Behavior During Therapy: WFL for tasks assessed/performed;Impulsive Overall Cognitive Status: Impaired/Different from baseline Area of Impairment: Memory;Awareness;Safety/judgement;Problem solving                     Memory: Decreased short-term memory   Safety/Judgement: Decreased awareness of safety;Decreased awareness of deficits Awareness: Emergent Problem Solving: Slow processing;Difficulty sequencing;Requires verbal cues;Requires tactile cues General Comments: Decreased awareness of safety during gait with RW and required continuous cues throughout. Difficulty sequencing with RW and easy distractibility during gait.       Exercises General Exercises - Upper Extremity Shoulder Flexion: AROM;Both;10 reps;Seated (ataxia noted) General Exercises - Lower Extremity Long Arc Quad: AROM;Both;10 reps;Seated Hip Flexion/Marching: AROM;Both;10 reps;Seated Toe Raises: AROM;Both;10 reps;Seated Heel Raises: AROM;10 reps;Both;Seated    General Comments        Pertinent Vitals/Pain Pain Assessment: No/denies pain    Home Living  Prior Function            PT Goals (current goals can now be found in the care plan section) Acute Rehab  PT Goals Patient Stated Goal: independence PT Goal Formulation: With patient Time For Goal Achievement: 10/21/16 Potential to Achieve Goals: Good Progress towards PT goals: Progressing toward goals    Frequency    Min 4X/week      PT Plan Current plan remains appropriate    Co-evaluation              AM-PAC PT "6 Clicks" Daily Activity  Outcome Measure  Difficulty turning over in bed (including adjusting bedclothes, sheets and blankets)?: A Little Difficulty moving from lying on back to sitting on the side of the bed? : A Little Difficulty sitting down on and standing up from a chair with arms (e.g., wheelchair, bedside commode, etc,.)?: Total Help needed moving to and from a bed to chair (including a wheelchair)?: A Little Help needed walking in hospital room?: A Lot Help needed climbing 3-5 steps with a railing? : A Lot 6 Click Score: 14    End of Session Equipment Utilized During Treatment: Gait belt Activity Tolerance: Patient tolerated treatment well Patient left: in chair;with call bell/phone within reach Nurse Communication: Mobility status PT Visit Diagnosis: Muscle weakness (generalized) (M62.81);Other abnormalities of gait and mobility (R26.89)     Time: [id number] PT Time Calculation (min) (ACUTE ONLY): 13 min  Charges:  $Gait Training: 8-22 mins                    G Codes:       Margot ChimesBrittany Smith, PT, DPT  Acute Rehabilitation Services  Pager: [phone]    Melvyn NovasBrittany L Smith 10/12/2016, 9:58 AM

## 2016-10-12 NOTE — Progress Notes (Signed)
I have an inpt rehab bed available to admit pt to today. I will make the arrangements with pt and her Mom. Dr Susie CassetteAbrol has been notified as well as RN CM. [phone]

## 2016-10-12 NOTE — Progress Notes (Signed)
Standley Brooking, RN Rehab Admission Coordinator Signed Physical Medicine and Rehabilitation  PMR Pre-admission Date of Service: 10/11/2016 10:25 AM  Related encounter: ED to Hosp-Admission (Current) from 10/03/2016 in [facility]       [] Hide copied text PMR Admission Coordinator Pre-Admission Assessment  Patient: Erika Boyer is an 21 y.o., female MRN: [id number] DOB: June 05, 1995 Height: 5\' 5"  (165.1 cm) Weight: 122.5 kg (270 lb)                                                                                                                                                  Insurance Information HMO:     PPO:      PCP:      IPA:      80/20: yes     OTHER:  No HMO PRIMARY: Medicare a and b      Policy#: [id number]      Subscriber: pt Benefits:  Phone #: passport one online     Name: 10/11/2016 Eff. Date: 11/19/2015     Deduct: $1340      Out of Pocket Max: none      Life Max: none CIR: 100%      SNF: 20 full days Outpatient: 80%     Co-Pay: 20% Home Health: 100%      Co-Pay: none DME: 80%     Co-Pay: 20% Providers: pt choice  SECONDARY: Medicaid of [place]  Policy#: [id number]      Subscriber: pt Benefits:  Phone #: [phone]     Name: 10/11/16 Active 09/18/2016  Medicaid Application Date:       Case Manager:  Disability Application Date:       Case Worker:   Emergency Contact Information        Contact Information    Name Relation Home Work Mobile   Linden,Phyllis Mother   [phone]     Current Medical History  Patient Admitting Diagnosis: Debility  History of Present Illness: HPI: Fiona Coto a 21 y.o.femalewith 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 CT chest reviewed, showing pleural and pericardial effusion. She was found to  have large left pleural effusion due to HAP or viral illness as well as large pericardial effusion. She was Intubated in ED and underwent left thoracocentesis for evacuation of 850 cc of cloudy exudative pleural effusion and on steroids by PCCM. Serologies positive with possibility of drug induced lupus and will need to be on steroids till seen by rheumatology. 2D echo with EF 60-65% with severe circumferential effusion and she underwent pericardiocentesis of 870 cc of serosanguineous fluid suspected to be due to acute peritonitis v/s  viral pericarditis. She was placed on colchicine with recommendations to continue X 3 months and follow up for repeat echo 1-2 days prior to discharge. She tolerated extubation on 5/18 and noted to be debilitated with substantial deficits in mobility.   [place] and moved from [place] a couple of months ago. Lives with mother and boyfriend there "most of the time". Mother manages home and provides supervision. She was independent--sedentary and watches TV most of the day. Was using walker "for safety" and when out of home recently.   Past Medical History      Past Medical History:  Diagnosis Date  . Anemia   . Anxiety   . Arthritis    "right knee" (10/11/2016)  . Cerebellar degeneration   . Chronic bronchitis (HCC)   . Daily headache   . Depression   . Graves disease 2012   hx; "was on RX; it disappeared" (10/11/2016)  . History of blood transfusion    "related to the anemia" (10/11/2016)  . Hypertension   . Lupus    "not sure what kind" (10/11/2016)  . Pneumonia 10/03/2016    Family History  family history is not on file.  Prior Rehab/Hospitalizations:  Has the patient had major surgery during 100 days prior to admission? No  Current Medications   Current Facility-Administered Medications:  .  aspirin chewable tablet 81 mg, 81 mg, Oral, Daily, Mannam, Praveen, MD, 81 mg at 10/12/16 0936 .  Chlorhexidine  Gluconate Cloth 2 % PADS 6 each, 6 each, Topical, Daily, Mannam, Praveen, MD, 6 each at 10/12/16 [phone] .  docusate (COLACE) 50 MG/5ML liquid 100 mg, 100 mg, Oral, BID PRN, Mannam, Praveen, MD .  enoxaparin (LOVENOX) injection 40 mg, 40 mg, Subcutaneous, Q24H, Junious Silk L, NP, 40 mg at 10/12/16 9604 .  furosemide (LASIX) tablet 20 mg, 20 mg, Oral, Daily, Zannie Cove, MD, 20 mg at 10/12/16 0936 .  metoprolol tartrate (LOPRESSOR) tablet 25 mg, 25 mg, Oral, BID, Zannie Cove, MD, 25 mg at 10/12/16 0936 .  multivitamin (PROSIGHT) tablet 1 tablet, 1 tablet, Oral, Daily, Mannam, Praveen, MD, 1 tablet at 10/12/16 0936 .  ondansetron (ZOFRAN) injection 4 mg, 4 mg, Intravenous, Q6H PRN, Merwyn Katos, MD, 4 mg at 10/09/16 1248 .  pantoprazole (PROTONIX) EC tablet 40 mg, 40 mg, Oral, Q1200, Zannie Cove, MD, 40 mg at 10/11/16 1208 .  potassium chloride 20 MEQ/15ML (10%) solution 20 mEq, 20 mEq, Oral, BID, Richarda Overlie, MD, 20 mEq at 10/12/16 0936 .  predniSONE (DELTASONE) tablet 40 mg, 40 mg, Oral, Q breakfast, Zannie Cove, MD, 40 mg at 10/12/16 0936 .  sodium chloride flush (NS) 0.9 % injection 10-40 mL, 10-40 mL, Intracatheter, Q12H, Mannam, Praveen, MD, 10 mL at 10/11/16 0927 .  sodium chloride flush (NS) 0.9 % injection 10-40 mL, 10-40 mL, Intracatheter, PRN, Mannam, Praveen, MD  Patients Current Diet: Diet regular Room service appropriate? Yes; Fluid consistency: Thin Diet - low sodium heart healthy  Precautions / Restrictions Precautions Precautions: Fall Restrictions Weight Bearing Restrictions: No   Has the patient had 2 or more falls or a fall with injury in the past year?No  Prior Activity Level Limited Community (1-2x/wk): no drivers license; recent moe two months ago from [place] to [place] (sedentary per her Mom). Pt sedentary and Mom pushing her to be more active.Pt graduated [employer]. 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 States she has a short  attention span. When Mom is present, Mom will ask her daughter to repeat what she just heard to make sure she understands. Pt then "parrots" what is said. Patient and Mom noticeably having issues with pt requesting more independence and Mom speaking for her. Pt states Mom will not let her make any decisions.  Mom states pt has had outpt therapy in the past with OT due to her hands dropping things, PT to assist with her walking,. Pt with poor organizational skills, poor ability to write and so pt used computer in school to write. Pt does pay her own bills with her Mom's assistance.   [place] and he visits pt mainly when Mom not present, Mom is aware of his visiting.  Home Assistive Devices / Equipment Home Assistive Devices/Equipment: None Home Equipment: Walker - 2 wheels  Prior Device Use: Indicate devices/aids used by the patient prior to current illness, exacerbation or injury? Walker. Just began using RW over past few weeks pta due to weakness. Mom states pt's walking had been good for several months pta.  Prior Functional Level Prior Function Level of Independence: Independent Comments: move from [place] ago; [place]  Self Care: Did the patient need help bathing, dressing, using the toilet or eating?  Independent  Indoor Mobility: Did the patient need assistance with walking from room to room (with or without device)? Independent  Stairs: Did the patient need assistance with internal or external stairs (with or without device)? Independent  Functional Cognition: Did the patient need help planning regular tasks such as shopping or remembering to take medications? Needed some help  Current Functional Level Cognition  Overall Cognitive Status: Impaired/Different from baseline Orientation Level: Oriented X4 Safety/Judgement: Decreased awareness of safety, Decreased awareness of  deficits General Comments: Decreased awareness of safety during gait with RW and required continuous cues throughout. Difficulty sequencing with RW and easy distractibility during gait.     Extremity Assessment (includes Sensation/Coordination)  Upper Extremity Assessment: Generalized weakness  Lower Extremity Assessment: Generalized weakness    ADLs       Mobility  Overal bed mobility: Needs Assistance Bed Mobility: Supine to Sit Supine to sit: Supervision Sit to supine: Min assist General bed mobility comments: In chair upon entry     Transfers  Overall transfer level: Needs assistance Equipment used: Rolling walker (2 wheeled) Transfers: Sit to/from Stand Sit to Stand: Min assist General transfer comment: Min A for steadying upon standing. Required safety cues to wait for PT to stand.     Ambulation / Gait / Stairs / Wheelchair Mobility  Ambulation/Gait Ambulation/Gait assistance: Min assist, Mod assist Ambulation Distance (Feet): 75 Feet Assistive device: Rolling walker (2 wheeled) Gait Pattern/deviations: Step-through pattern, Ataxic, Staggering left, Staggering right General Gait Details: Very unsteady gait with RW. Multiple safety cues for appropriate proximity and sequencing with RW. Ataxic gait, requiring up to mod A for steadying. Required verbal cues to stop and catch balance before continuing distance.  Gait velocity: decreased Gait velocity interpretation: Below normal speed for age/gender    Posture / Balance Dynamic Sitting Balance Sitting balance - Comments: pt able to maintain balance EOB but lack of trunk control noted in static sitting Balance Overall balance assessment: Needs assistance Sitting-balance support: Feet supported, No upper extremity supported Sitting balance-Leahy Scale: Good Sitting balance - Comments: pt able to maintain balance EOB but lack of trunk control noted in static sitting Standing balance support: Bilateral upper  extremity supported,  No upper extremity supported, Single extremity supported Standing balance-Leahy Scale: Poor Standing balance comment: needs external assist dynamically to prevent falls.     Special needs/care consideration BiPAP/CPAP  N/a CPM  N/a Continuous Drip IV  N/a Dialysis n/a Life Vest  N/a Oxygen  N/a Special Bed  N/a Trach Size  N/a Wound Vac n/a Skin excoriated areas to anterior aspects of BLE with edema noted                           Bowel mgmt:  Continent LBM 10/11/2016 Bladder mgmt:  continent Diabetic mgmt  N/a Decreased safety awareness; impulsive   Previous Home Environment Living Arrangements: Parent  Lives With:  (Mom) Available Help at Discharge: Family (Mom works partime 3 days per week) Type of Home: Apartment Home Layout: 1/2 bath on main level, Two level, Able to live on main level with bedroom/bathroom Alternate Level Stairs-Rails: Right, Left Alternate Level Stairs-Number of Steps: flight Home Access: Stairs to enter Secretary/administrator of Steps: 1 Bathroom Shower/Tub: Tub/shower unit, Curtain (pt feels she needs handicapped bars in bath) Bathroom Toilet: Standard Bathroom Accessibility: Yes How Accessible: Accessible via walker Home Care Services: No Additional Comments: Obtained RW after moving to [place], but only used in occassionally in the last few days prior to hospitalization.  Discharge Living Setting Plans for Discharge Living Setting: Lives with (comment) (Mom) Type of Home at Discharge: Apartment Discharge Home Layout: Two level, 1/2 bath on main level, Able to live on main level with bedroom/bathroom Alternate Level Stairs-Rails: Right, Left (two half rails on one side and full rail on other side) Alternate Level Stairs-Number of Steps: flight Discharge Home Access: Stairs to enter Entrance Stairs-Rails: None Entrance Stairs-Number of Steps: 1 Discharge Bathroom Shower/Tub: Tub/shower unit, Curtain (pt requests bars added to  tub for safety) Discharge Bathroom Toilet: Standard Discharge Bathroom Accessibility: Yes How Accessible: Accessible via walker Does the patient have any problems obtaining your medications?: No  Social/Family/Support Systems Contact Information: Jamesetta So, Mom Anticipated Caregiver: Mom Anticipated Caregiver's Contact Information: see above Ability/Limitations of Caregiver: Mom works part time 3 days per week Caregiver Availability: Intermittent Discharge Plan Discussed with Primary Caregiver: Yes Is Caregiver In Agreement with Plan?: Yes Does Caregiver/Family have Issues with Lodging/Transportation while Pt is in Rehab?: No  Patient has siblings who live in [place] area. Her Dad is from [place].  Goals/Additional Needs Patient/Family Goal for Rehab: Mod I to supervision with PT, OT, and SLP Expected length of stay: [facility] 7-11 days Special Service Needs: Pt with cognitive impairments at Baseline Pt/Family Agrees to Admission and willing to participate: Yes Program Orientation Provided & Reviewed with Pt/Caregiver Including Roles  & Responsibilities: Yes  Decrease burden of Care through IP rehab admission: n/a  Possible need for SNF placement upon discharge: not anticipated  Patient Condition: This patient's medical and functional status has changed since the consult dated: 10/08/2016 in which the Rehabilitation Physician determined and documented that the patient's condition is appropriate for intensive rehabilitative care in an inpatient rehabilitation facility. See "History of Present Illness" (above) for medical update. Functional changes are: overall min to mod assist. Patient's medical and functional status update has been discussed with the Rehabilitation physician and patient remains appropriate for inpatient rehabilitation. Will admit to inpatient rehab today.  Preadmission Screen Completed By:  Clois Dupes, 10/12/2016 10:48  AM ______________________________________________________________________   Discussed status with Dr. Riley Kill on 10/12/2016 at  1047 and received telephone approval for admission  today.  Admission Coordinator:  Clois Dupes, time 1610 Date 10/12/2016.       Cosigned by: Ranelle Oyster, MD at 10/12/2016 10:56 AM  Revision History

## 2016-10-12 NOTE — Significant Event (Signed)
Rapid Response Event Note  Overview:Called by bedside RN with concerns for RR-30s with shallow respirations. Time Called: 2150 Arrival Time: 2200 Event Type: Respiratory  Initial Focused Assessment: Pt asleep in bed.  Per RN pt alert and oriented and very anxious PTA RRT.  Pt not awoken at time of arrival RRT d/t prior agitation.  VS-HR-102, BP-130/78, RR-32, sats-100% on RA  Lung sounds with scattered crackles t/o all lung fields.  Pt edematous BUE<LUE.  Heart sounds [id number].  No murmur, rub, or gallop heard. PCXR done PTA RRT showing questionable LLL PNA, atelectasis, small right pleural effusion, and moderate L pleural effusion.  Interventions: BMET/CBC/BNP, vanc/zosyn, 20mg  IV lasix per MD order.  Monitor pt.  Called RRT if further assistance needed. Plan of Care (if not transferred):  Event Summary: Name of Physician Notified: Dr. Posey ReaPlotnikov at  (PTA RRT)    at    Outcome: Stayed in room and stabalized  Event End Time: 2225  Terrilyn SaverHopper, Delonda Coley Anderson

## 2016-10-12 NOTE — H&P (Signed)
Physical Medicine and Rehabilitation Admission H&P       Chief Complaint  Patient presents with  . 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 CT chest reviewed, showing pleural and pericardial effusion. She was found to have large left pleural effusion due to CAP or viral illness as well as large pericardial effusion. she was intubated in ED and underwent left thoracocentesis for evacuation of 850 cc of cloudy exudative pleural effusion. She was placed on IV antibiotics as well as steroids due to concerns of drug induced SLE..  2D echo with EF 60-65% with severe circumferential effusion with early tamponade and on 5/17 and she underwent pericardiocentesis of 870 cc of serosanguineous fluid. She was placed on colchicine empirically by Dr. Einar Gip due to concerns of acute peritonitis v/s viral pericarditis with recommendations to repeat echo 1-2 days prior to discharge.  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 [facility] hospital[facility] has discussed case with Dr. Gavin Pound who recommended continuing prednisone 40 mg daily till follow up in office (they will contact patient with appt). Repeat 2 D echo on 5/23 showed EF 55-60% with small to moderate partially loculated pericardial effusion with fibrinous strands c/w hemopericardium, organized thrombus and no chamber collapse or tamponade.  AKI has improved with hydration.   Anemia of chronic disease with drop in H/H to 6.8/21.8 treated with on unit PRBC. Her respiratory status and endurance is showing improvement.  Therapy ongoing and she was noted to to be debilitated with substantial  deficits in balance, mobility and decreased ability to carry out ADL tasks. CIR recommended for follow up therapy   Review of Systems  Constitutional: Positive for malaise/fatigue. Negative for chills and fever.  HENT: Negative for hearing loss and tinnitus.   Eyes: Negative for blurred vision and double vision.  Respiratory: Positive for cough, shortness of breath (chest feels tight) and wheezing.   Cardiovascular: Positive for leg swelling. Negative for chest pain.  Gastrointestinal: Positive for abdominal pain (bloating?) and diarrhea.  Genitourinary: Negative for dysuria and urgency.  Musculoskeletal: Positive for back pain and joint pain (right knee pain since fall a couple of months ago).  Skin: Negative for itching and rash.  Neurological: Positive for weakness. Negative for dizziness, sensory change, focal weakness and headaches.  Psychiatric/Behavioral: Negative for depression. The patient is not nervous/anxious.           Past Medical History:  Diagnosis Date  . Anemia   . Anxiety   . Arthritis    "right knee" (10/11/2016)  . Cerebellar degeneration   . Chronic bronchitis ([place])   . Daily headache   . Depression   . Graves disease 2012   hx; "was on RX; it disappeared" (10/11/2016)  . History of blood transfusion    "related to the anemia" (10/11/2016)  . Hypertension   . Lupus    "not sure what kind" (10/11/2016)  . Pneumonia 10/03/2016          Past Surgical History:  Procedure Laterality Date  . PERICARDIOCENTESIS N/A 10/04/2016   Procedure: Pericardiocentesis;  Surgeon: Adrian Prows, MD;  Location: [facility];  Service:  Cardiovascular;  Laterality: N/A;  . REDUCTION MAMMAPLASTY Bilateral 2012  . TONSILLECTOMY           Family History  Problem Relation Age of Onset  . Thalassemia Mother   . Arthritis Mother   . High blood pressure Father   . Mental illness Father        question bipolar disorder per wife  . Sickle  cell trait Maternal Aunt       Social History:  Single. Lives in mother and boyfriend? (there most of the time). Moved from [place] a couple of months ago.  She reports that she has never smoked. She has never used smokeless tobacco. She reports that she does not drink alcohol or use drugs.          Allergies  Allergen Reactions  . Shellfish Allergy Anaphylaxis  . Morphine And Related Itching  . Mushroom Extract Complex Other (See Comments)    Mother is allergic, so patient avoids these  . Tapazole [Methimazole] Swelling and Rash    WELTS (also) Mother suspects this MAY be an allergy, as the onset of symptoms coincided with this being started          Medications Prior to Admission  Medication Sig Dispense Refill  . albuterol (PROAIR HFA) 108 (90 Base) MCG/ACT inhaler Inhale 2 puffs into the lungs every 4 (four) hours as needed for wheezing or shortness of breath.    . EPINEPHrine (EPIPEN 2-PAK) 0.3 mg/0.3 mL IJ SOAJ injection Inject 0.3 mg into the muscle once as needed (for anaphylaxis).     Marland Kitchen ibuprofen (ADVIL,MOTRIN) 600 MG tablet Take 600 mg by mouth every 6 (six) hours as needed (for pain).    . methimazole (TAPAZOLE) 10 MG tablet Take 1.5 tablets (15 mg total) by mouth daily. 45 tablet 2    Home: Home Living Family/patient expects to be discharged to:: Private residence Living Arrangements: Parent Available Help at Discharge: Family (Mom works partime 3 days per week) Type of Home: Apartment Home Access: Stairs to enter Technical brewer of Steps: 1 Home Layout: 1/2 bath on main level, Two level, Able to live on main level with bedroom/bathroom Alternate Level Stairs-Number of Steps: flight Alternate Level Stairs-Rails: Right, Left Bathroom Shower/Tub: Tub/shower unit, Curtain (pt feels she needs handicapped bars in bath) Bathroom Toilet: Standard Bathroom Accessibility: Yes Home Equipment: Walker - 2 wheels Additional Comments: Obtained RW after  moving to [place], but only used in occassionally in the last few days prior to hospitalization.  Lives With:  (Mom)   Functional History: Prior Function Level of Independence: Independent Comments: move from [place] 2 months ago; boyfriend from [place]  Functional Status:  Mobility: Bed Mobility Overal bed mobility: Needs Assistance Bed Mobility: Supine to Sit Supine to sit: Supervision Sit to supine: Min assist General bed mobility comments: In chair upon entry  Transfers Overall transfer level: Needs assistance Equipment used: Rolling walker (2 wheeled) Transfers: Sit to/from Stand Sit to Stand: Min assist General transfer comment: Min A for steadying upon standing. Required safety cues to wait for PT to stand.  Ambulation/Gait Ambulation/Gait assistance: Min assist, Mod assist Ambulation Distance (Feet): 75 Feet Assistive device: Rolling walker (2 wheeled) Gait Pattern/deviations: Step-through pattern, Ataxic, Staggering left, Staggering right General Gait Details: Very unsteady gait with RW. Multiple safety cues for appropriate proximity and sequencing with RW. Ataxic gait, requiring up to mod A for steadying. Required verbal cues to stop and catch balance before continuing distance.  Gait velocity: decreased Gait velocity  interpretation: Below normal speed for age/gender  ADL:  Cognition: Cognition Overall Cognitive Status: Impaired/Different from baseline Orientation Level: Oriented X4 Cognition Arousal/Alertness: Awake/alert Behavior During Therapy: WFL for tasks assessed/performed, Impulsive Overall Cognitive Status: Impaired/Different from baseline Area of Impairment: Memory, Awareness, Safety/judgement, Problem solving Memory: Decreased short-term memory Safety/Judgement: Decreased awareness of safety, Decreased awareness of deficits Awareness: Emergent Problem Solving: Slow processing, Difficulty sequencing, Requires verbal cues, Requires tactile cues General  Comments: Decreased awareness of safety during gait with RW and required continuous cues throughout. Difficulty sequencing with RW and easy distractibility during gait.    Blood pressure 133/89, pulse 87, temperature 99.7 F (37.6 C), temperature source Oral, resp. rate 17, height 5' 5"  (1.651 m), weight 122.5 kg (270 lb), last menstrual period 10/08/2016, SpO2 95 %. Physical Exam  Nursing note and vitals reviewed. Constitutional: She is oriented to person, place, and time. She appears well-developed and well-nourished. No distress.  HENT:  Head: Normocephalic and atraumatic.  Mouth/Throat: Oropharynx is clear and moist.  Eyes: Conjunctivae are normal. Pupils are equal, round, and reactive to light.  Neck: Normal range of motion. Neck supple.  Cardiovascular: Normal rate and regular rhythm.   Respiratory: No accessory muscle usage or stridor. No respiratory distress. She has decreased breath sounds.  Increased WOB with conversation and audible wheeze at times.   GI: Soft. Bowel sounds are normal. She exhibits no distension. There is no rebound.  Musculoskeletal: She exhibits edema. She exhibits no tenderness.  2+ edema BLE  Neurological: She is alert and oriented to person, place, and time.  Skin: Skin is warm and dry. She is not diaphoretic.  Psychiatric: She has a normal mood and affect. Her behavior is normal. Thought content normal.    Lab Results Last 48 Hours        Results for orders placed or performed during the hospital encounter of 10/03/16 (from the past 48 hour(s))  CBC     Status: Abnormal   Collection Time: 10/11/16  5:31 AM  Result Value Ref Range   WBC 7.2 4.0 - 10.5 K/uL   RBC 3.39 (L) 3.87 - 5.11 MIL/uL   Hemoglobin 7.7 (L) 12.0 - 15.0 g/dL   HCT 24.6 (L) 36.0 - 46.0 %   MCV 72.6 (L) 78.0 - 100.0 fL   MCH 22.7 (L) 26.0 - 34.0 pg   MCHC 31.3 30.0 - 36.0 g/dL   RDW 18.3 (H) 11.5 - 15.5 %   Platelets 312 150 - 400 K/uL  Comprehensive metabolic  panel     Status: Abnormal   Collection Time: 10/11/16  5:31 AM  Result Value Ref Range   Sodium 141 135 - 145 mmol/L   Potassium 3.4 (L) 3.5 - 5.1 mmol/L   Chloride 113 (H) 101 - 111 mmol/L   CO2 23 22 - 32 mmol/L   Glucose, Bld 102 (H) 65 - 99 mg/dL   BUN 14 6 - 20 mg/dL   Creatinine, Ser 0.86 0.44 - 1.00 mg/dL   Calcium 8.1 (L) 8.9 - 10.3 mg/dL   Total Protein 5.3 (L) 6.5 - 8.1 g/dL   Albumin 2.1 (L) 3.5 - 5.0 g/dL   AST 17 15 - 41 U/L   ALT 18 14 - 54 U/L   Alkaline Phosphatase 45 38 - 126 U/L   Total Bilirubin 0.4 0.3 - 1.2 mg/dL   GFR calc non Af Amer >60 >60 mL/min   GFR calc Af Amer >60 >60 mL/min    Comment: (NOTE) The eGFR has  been calculated using the CKD EPI equation. This calculation has not been validated in all clinical situations. eGFR's persistently <60 mL/min signify possible Chronic Kidney Disease.    Anion gap 5 5 - 15  CBC     Status: Abnormal   Collection Time: 10/11/16  9:56 AM  Result Value Ref Range   WBC 8.0 4.0 - 10.5 K/uL   RBC 3.49 (L) 3.87 - 5.11 MIL/uL   Hemoglobin 8.0 (L) 12.0 - 15.0 g/dL   HCT 25.4 (L) 36.0 - 46.0 %   MCV 72.8 (L) 78.0 - 100.0 fL   MCH 22.9 (L) 26.0 - 34.0 pg   MCHC 31.5 30.0 - 36.0 g/dL   RDW 18.2 (H) 11.5 - 15.5 %   Platelets 389 150 - 400 K/uL     Imaging Results (Last 48 hours)  No results found.       Medical Problem List and Plan: 1.  Debility  secondary to Sepsis, lupus with multiple medical issues.               -admit to inpatient rehab 2.  DVT Prophylaxis/Anticoagulation: Pharmaceutical: Lovenox 3. Pain Management: tylenol prn as well as increase in mobility should help with back pain.  4. Mood: LCSW to follow for evaluation and support.  5. Neuropsych: This patient is not fully capable of making decisions on his own behalf. 6. Skin/Wound Care: routine pressure relief measures. Add protein supplement for low protein stores and to help with edema.  7.  Fluids/Electrolytes/Nutrition: Monitor I/O. Check lytes in am.  8. CAP with SOB: Treated and respiratory status improving. Encourage pulmonary hygiene with flutter valve. Will add nebulizer's as mother concerned about  Breathing/ bronchitis.  9. Pleural effusion: will need repeat CXR 5/28 with thoracocentesis if still present or worse. 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 To continue prednisone 40 mg daily--will check Hgb A1c for baseline. To follow up with Dr. Trudie Reed after dischage 11. Peripheral edema: Monitor weights daily.  12. Iron deficiency anemia:  Improved with transfusion. Add iron supplement. :  13. Tachycardia: Monitor BP/HR bid --continue metoprolol bid.  14. Hyperthyroid: off methamazole.     Post Admission Physician Evaluation: 1. Functional deficits secondary  to debility from multiple medical issues. 2. Patient is admitted to receive collaborative, interdisciplinary care between the physiatrist, rehab nursing staff, and therapy team. 3. Patient's level of medical complexity and substantial therapy needs in context of that medical necessity cannot be provided at a lesser intensity of care such as a SNF. 4. Patient has experienced substantial functional loss from his/her baseline which was documented above under the "Functional History" and "Functional Status" headings.  Judging by the patient's diagnosis, physical exam, and functional history, the patient has potential for functional progress which will result in measurable gains while on inpatient rehab.  These gains will be of substantial and practical use upon discharge  in facilitating mobility and self-care at the household level. 5. Physiatrist will provide 24 hour management of medical needs as well as oversight of the therapy plan/treatment and provide guidance as appropriate regarding the interaction of the two. 6. The Preadmission Screening has been reviewed and patient status is unchanged unless otherwise stated above. 7. 24 hour  rehab nursing will assist with bladder management, bowel management, safety, skin/wound care, disease management, medication administration, pain management and patient education  and help integrate therapy concepts, techniques,education, etc. 8. PT will assess and treat for/with: Lower extremity strength, range of motion, stamina, balance, functional mobility, safety, adaptive techniques  and equipment, pain mgt activity tolerance.   Goals are: mod I. 9. OT will assess and treat for/with: ADL's, functional mobility, safety, upper extremity strength, adaptive techniques and equipment, pain control, family education.   Goals are: mod I. Therapy may proceed with showering this patient. 10. SLP will assess and treat for/with: n/a.  Goals are: n/a. 11. Case Management and Social Worker will assess and treat for psychological issues and discharge planning. 12. Team conference will be held weekly to assess progress toward goals and to determine barriers to discharge. 13. Patient will receive at least 3 hours of therapy per day at least 5 days per week. 14. ELOS: 12-15 days       15. Prognosis:  excellent     Meredith Staggers, MD, [place] [facility] 10/12/2016  Bary Leriche, Hershal Coria 10/12/2016

## 2016-10-12 NOTE — Progress Notes (Signed)
Patient instructed on use of Flutter Valve. Patient unable to produce deep breath or strong cough for me at this time. Left at bedside and patient instructed to use during stay.

## 2016-10-12 NOTE — Progress Notes (Signed)
Pharmacy Antibiotic Note  Erika Boyer is a 21 y.o. female admitted on 10/12/2016 with pneumonia.  Pharmacy has been consulted for vancomycin and zosyn dosing.  Rapid response called for shallow breathing, CXR with possible pneumonia and pleural effusions. Orders to start empiric abx. No fevers, wbc normal this am, new labs pending.  Plan: Vancomycin 2g IV x1 then 1250g IV q8 hours.  Goal trough 15-20 mcg/mL. Zosyn 3.375g IV q8h (4 hour infusion).  Height: 5\' 5"  (165.1 cm) Weight: 275 lb 8 oz (125 kg) IBW/kg (Calculated) : 57  Temp (24hrs), Avg:98.7 F (37.1 C), Min:98.1 F (36.7 C), Max:99.7 F (37.6 C)   Recent Labs Lab 10/08/16 0543 10/08/16 1457 10/09/16 0655 10/09/16 2345 10/10/16 0427 10/11/16 0531 10/11/16 0956  WBC 8.4 8.5  --   --  7.4 7.2 8.0  CREATININE 0.88  --  0.86 0.83 0.79 0.86  --     Estimated Creatinine Clearance: 138.7 mL/min (by C-G formula based on SCr of 0.86 mg/dL).    Allergies  Allergen Reactions  . Shellfish Allergy Anaphylaxis  . Morphine And Related Itching  . Mushroom Extract Complex Other (See Comments)    Mother is allergic, so patient avoids these  . Tapazole [Methimazole] Swelling and Rash    WELTS (also) Mother suspects this MAY be an allergy, as the onset of symptoms coincided with this being started   Thank you for allowing pharmacy to be a part of this patient's care.  Sheppard CoilFrank Boyer PharmD., BCPS Clinical Pharmacist Pager [phone] 10/12/2016 10:43 PM

## 2016-10-13 ENCOUNTER — Inpatient Hospital Stay (HOSPITAL_COMMUNITY): Payer: Medicare Other | Admitting: Speech Pathology

## 2016-10-13 ENCOUNTER — Inpatient Hospital Stay (HOSPITAL_COMMUNITY): Payer: Medicare Other

## 2016-10-13 ENCOUNTER — Inpatient Hospital Stay (HOSPITAL_COMMUNITY): Payer: Medicare Other | Admitting: Physical Therapy

## 2016-10-13 ENCOUNTER — Inpatient Hospital Stay (HOSPITAL_COMMUNITY): Payer: Medicare Other | Admitting: Occupational Therapy

## 2016-10-13 DIAGNOSIS — R5381 Other malaise: Principal | ICD-10-CM

## 2016-10-13 DIAGNOSIS — R600 Localized edema: Secondary | ICD-10-CM

## 2016-10-13 DIAGNOSIS — M329 Systemic lupus erythematosus, unspecified: Secondary | ICD-10-CM

## 2016-10-13 DIAGNOSIS — J189 Pneumonia, unspecified organism: Secondary | ICD-10-CM

## 2016-10-13 LAB — URINALYSIS, ROUTINE W REFLEX MICROSCOPIC
Bilirubin Urine: NEGATIVE
Glucose, UA: NEGATIVE mg/dL
Ketones, ur: NEGATIVE mg/dL
Nitrite: NEGATIVE
Protein, ur: NEGATIVE mg/dL
SPECIFIC GRAVITY, URINE: 1.011 (ref 1.005–1.030)
pH: 5 (ref 5.0–8.0)

## 2016-10-13 LAB — CBC WITH DIFFERENTIAL/PLATELET
BASOS ABS: 0 10*3/uL (ref 0.0–0.1)
Basophils Relative: 0 %
Eosinophils Absolute: 0 10*3/uL (ref 0.0–0.7)
Eosinophils Relative: 0 %
HEMATOCRIT: 25.8 % — AB (ref 36.0–46.0)
Hemoglobin: 8.2 g/dL — ABNORMAL LOW (ref 12.0–15.0)
LYMPHS ABS: 1.3 10*3/uL (ref 0.7–4.0)
Lymphocytes Relative: 14 %
MCH: 23.2 pg — AB (ref 26.0–34.0)
MCHC: 31.8 g/dL (ref 30.0–36.0)
MCV: 73.1 fL — AB (ref 78.0–100.0)
Monocytes Absolute: 1 10*3/uL (ref 0.1–1.0)
Monocytes Relative: 11 %
NEUTROS ABS: 6.9 10*3/uL (ref 1.7–7.7)
NEUTROS PCT: 75 %
Platelets: 369 10*3/uL (ref 150–400)
RBC: 3.53 MIL/uL — AB (ref 3.87–5.11)
RDW: 19.1 % — ABNORMAL HIGH (ref 11.5–15.5)
WBC: 9.2 10*3/uL (ref 4.0–10.5)

## 2016-10-13 LAB — COMPREHENSIVE METABOLIC PANEL
ALK PHOS: 39 U/L (ref 38–126)
ALT: 15 U/L (ref 14–54)
AST: 12 U/L — AB (ref 15–41)
Albumin: 2.3 g/dL — ABNORMAL LOW (ref 3.5–5.0)
Anion gap: 7 (ref 5–15)
BUN: 11 mg/dL (ref 6–20)
CALCIUM: 8.3 mg/dL — AB (ref 8.9–10.3)
CO2: 25 mmol/L (ref 22–32)
CREATININE: 0.89 mg/dL (ref 0.44–1.00)
Chloride: 106 mmol/L (ref 101–111)
Glucose, Bld: 85 mg/dL (ref 65–99)
Potassium: 3.2 mmol/L — ABNORMAL LOW (ref 3.5–5.1)
Sodium: 138 mmol/L (ref 135–145)
Total Bilirubin: 0.5 mg/dL (ref 0.3–1.2)
Total Protein: 5.8 g/dL — ABNORMAL LOW (ref 6.5–8.1)

## 2016-10-13 LAB — BRAIN NATRIURETIC PEPTIDE: B NATRIURETIC PEPTIDE 5: 582.7 pg/mL — AB (ref 0.0–100.0)

## 2016-10-13 MED ORDER — FUROSEMIDE 10 MG/ML IJ SOLN
20.0000 mg | Freq: Once | INTRAMUSCULAR | Status: AC
  Administered 2016-10-13: 20 mg via INTRAVENOUS
  Filled 2016-10-13: qty 2

## 2016-10-13 MED ORDER — FUROSEMIDE 40 MG PO TABS
40.0000 mg | ORAL_TABLET | Freq: Every day | ORAL | Status: DC
  Administered 2016-10-14 – 2016-10-16 (×3): 40 mg via ORAL
  Filled 2016-10-13 (×3): qty 1

## 2016-10-13 MED ORDER — POTASSIUM CHLORIDE CRYS ER 20 MEQ PO TBCR
20.0000 meq | EXTENDED_RELEASE_TABLET | Freq: Two times a day (BID) | ORAL | Status: DC
  Administered 2016-10-13 – 2016-10-25 (×25): 20 meq via ORAL
  Filled 2016-10-13 (×25): qty 1

## 2016-10-13 NOTE — Plan of Care (Signed)
Problem: Food- and Nutrition-Related Knowledge Deficit (NB-1.1) Goal: Nutrition education Formal process to instruct or train a patient/client in a skill or to impart knowledge to help patients/clients voluntarily manage or modify food choices and eating behavior to maintain or improve health. Outcome: Progressing Pt does not appear mentally capable of processing diet information. Only gave very simple responses. Feel it is largely up to her parents to influence what the patient eats. She does not have someone at home with her throughout the day. Ideally, parents would eliminate access to her extremely high salt items, convenience foods and salt shaker.   Nutrition Education Note  RD called RN to inquire about diet education as to what specific diet education RD was supposed to deliver. She reported patient eating high sodiums items and having severe swelling. As such focused on low sodium. Obviously would benefit from wt loss too.   RD consulted for nutrition education regarding a Low sodium diet.   RD provided "Low sodium Nutrition Therapy" handout from the [employer].  Reviewed patient's dietary recall.  Breakfast: Eggs w/ cheese, bacon.  Lunch: Chicken salad sandwich Dinner: "I forgot"  Became apparent patient was mentioning what she was getting in hospital. Not at home. Used different approach and asked about specific habits. She confirms that she uses the salt shaker at home. She drinks Regular soda. Favorite food is chicken alfredo. She eats bacon at home as well. She eats fast food frequently. Eats hotdogs  First and foremost, she should eliminate/drastically reduce fast food intake. RD stated how these items are high in salt and kcals- didn't respond to this.   Secondly, she should not be using the salt shaker. This is adding unnessary amounts of sodium to, what sounds like, a diet already very high in salt. RD recommended pepper, garlic powder, onion powder.    Thirdly, recommended choosing eggs or cereal over her regular bacon. This is one of the saltiest breakfast items available.   She is very dyspneic. She says her breathing is poor. Is very swollen. RD stated that it is in her best interest to reduce sodium so she can breathe better. Pt Nodded.  . Summary of recommendations 1. Eliminate use of salt shaker. Use salt free alternatives: pepper, onion powder, garlic powder 2. Eliminate bacon. Very high sodium item. Choose eggs or cereal 3. Reduce/cut back on fast food 4. Eliminate soda.   Unknown expected level of compliance. Pt does not appear mentally capable of processing diet information. Feel it is largely up to her parents to influence what the patient eats. Handouts were left at bedside.   Body mass index is 40.45 kg/m. Pt meets criteria for obese (has significant swelling at this time) based on current BMI.   No further nutrition interventions warranted at this time.  If additional nutrition issues arise, please re-consult RD.  Erika LouisNathan Samad Boyer RD, LDN, CNSC Clinical Nutrition Pager: [phone] 10/13/2016 11:08 AM

## 2016-10-13 NOTE — Evaluation (Signed)
Physical Therapy Assessment and Plan  Patient Details  Name: Francessca Friis MRN: [id number] Date of Birth: September 16, 1995  PT Diagnosis: Abnormality of gait, Coordination disorder, Difficulty walking and Muscle weakness Rehab Potential: Fair ELOS: 12-15 Days   Today's Date: 10/13/2016 PT Individual Time: 1100-1200 PT Individual Time Calculation (min): 60 min    Problem List:  Patient Active Problem List   Diagnosis Date Noted  . Debility 10/12/2016  . Leg swelling   . Shortness of breath   . Cerebral atrophy   . MR (mental retardation)   . Drug-induced lupus erythematosus   . Tachycardia   . Hypokalemia   . Acute blood loss anemia   . S/P thoracentesis   . Sepsis ([place]) 10/03/2016  . Acute respiratory failure with hypoxia ([place]) 10/03/2016  . CAP (community acquired pneumonia) 10/03/2016  . Hyperthyroidism 10/03/2016  . Morbid obesity with BMI of 40.0-44.9, adult ([place]) 10/03/2016  . Acute kidney injury ([place]) 10/03/2016  . 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 Microcytic anemia 10/03/2016  . Cardiomegaly 10/03/2016  . Acute respiratory failure ([place]) 10/03/2016  . Lobar pneumonia ([place])   . Pleural effusion   . Pleural effusion, left   . Graves disease 08/23/2016    Past Medical History:  Past Medical History:  Diagnosis Date  . Anemia   . Anxiety   . Arthritis    "right knee" (10/11/2016)  . Cerebellar degeneration   . Chronic bronchitis ([place])   . Daily headache   . Depression   . Graves disease 2012   hx; "was on RX; it disappeared" (10/11/2016)  . History of blood transfusion    "related to the anemia" (10/11/2016)  . Hypertension   . Lupus    "not sure what kind" (10/11/2016)  . Pneumonia 10/03/2016   Past Surgical History:  Past Surgical History:  Procedure Laterality Date  . PERICARDIOCENTESIS N/A 10/04/2016   Procedure: Pericardiocentesis;  Surgeon: Adrian Prows, MD;  Location: [facility];  Service: Cardiovascular;  Laterality: N/A;  . REDUCTION  MAMMAPLASTY Bilateral 2012  . TONSILLECTOMY      Assessment & Plan Clinical Impression: Patient is a 21 y.o.femalewith history of, cerebral atrophy due to viral illness at age 21, MR with bouts of agitation, morbid obesity, Graves disease; who was admitted on 10/03/16 with 3 day history of progressive SOB, BLE edema with erythema X 2 weeks and acute renal failure. CT chest reviewed, showing pleural and pericardial effusion. She was found to have large left pleural effusion due to CAP or viral illness as well as large pericardial effusion. she was intubated in ED and underwent left thoracocentesis for evacuation of 850 cc of cloudy exudative pleural effusion. She was placed on IV antibiotics as well as steroids due to concerns of drug induced SLE.. 2D echo with EF 60-65% with severe circumferential effusion with early tamponade and on 5/17 and she underwent pericardiocentesis of 870 cc of serosanguineous fluid. She was placed on colchicine empirically by Dr. Einar Gip due to concerns of acute peritonitis v/s viral pericarditis with recommendations to repeat echo 1-2 days prior to discharge. 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 [facility] hospitalist has discussed case with Dr. Gavin Pound who recommended continuing prednisone 40 mg daily till follow up in office (they will contact patient with appt). Repeat 2 D echo on 5/23 showed EF 55-60% with small to moderate partially loculated pericardial effusion with fibrinous strands c/w hemopericardium,  organized thrombus and no chamber collapse or tamponade. AKI has improved with hydration. Anemia of chronic disease with drop in H/H to 6.8/21.8 treated with on unit PRBC. Her respiratory status and endurance is showing improvement.  Patient transferred to CIR on 10/12/2016 .   Patient currently requires mod with mobility secondary to muscle weakness,  decreased cardiorespiratoy endurance and decreased oxygen support, ataxia and decreased coordination and decreased standing balance, decreased postural control and decreased balance strategies.  Prior to hospitalization, patient was modified independent  with mobility and lived with Other (Comment) (Mother) in a Other(Comment) (Townhome) home.  Home access is 1Stairs to enter.  Patient will benefit from skilled PT intervention to maximize safe functional mobility, minimize fall risk and decrease caregiver burden for planned discharge home with intermittent assist.  Anticipate patient will benefit from follow up [facility] at discharge.  PT - End of Session Activity Tolerance: Tolerates < 10 min activity, no significant change in vital signs Endurance Deficit: Yes PT Assessment Rehab Potential (ACUTE/IP ONLY): Fair Barriers to Discharge: Inaccessible home environment;Decreased caregiver support PT Patient demonstrates impairments in the following area(s): Balance;Edema;Endurance;Motor;Pain;Perception;Safety;Skin Integrity;Sensory;Behavior;Nutrition PT Transfers Functional Problem(s): Bed Mobility;Bed to Chair;Car;Furniture;Floor PT Locomotion Functional Problem(s): Ambulation;Wheelchair Mobility;Stairs PT Plan PT Intensity: Minimum of 1-2 x/day ,45 to 90 minutes PT Frequency: 5 out of 7 days PT Duration Estimated Length of Stay: 12-15 Days PT Treatment/Interventions: Ambulation/gait training;Balance/vestibular training;Cognitive remediation/compensation;Disease management/prevention;Discharge planning;Community reintegration;DME/adaptive equipment instruction;Functional electrical stimulation;Functional mobility training;Neuromuscular re-education;Pain management;Patient/family education;Psychosocial support;Skin care/wound management;Stair training;Therapeutic Activities;Therapeutic Exercise;Splinting/orthotics;UE/LE Strength taining/ROM;UE/LE Coordination activities;Visual/perceptual  remediation/compensation;Wheelchair propulsion/positioning PT Transfers Anticipated Outcome(s): Mod I with LRAD  PT Locomotion Anticipated Outcome(s): Mod I at household distance with LRAD  PT Recommendation Recommendations for Other Services: Neuropsych consult;Therapeutic Recreation consult Therapeutic Recreation Interventions: Pet therapy;Kitchen group;Stress management Follow Up Recommendations: Home health PT Patient destination: Home Equipment Recommended: Rolling walker with 5" wheels;Wheelchair cushion (measurements);Wheelchair (measurements);To be determined  Skilled Therapeutic Intervention PT instructed patient in PT Evaluation and initiated treatment intervention; see below for results. PT educated patient in [place], rehab potential, rehab goals, and discharge recommendations. Vitals assessed following gait 140/104. No change following 5 minute rest break. Pt instructed in stair negotiation x 4 steps with BUE support. Pt reports nausea following stairs. Pt returned to room and performed toileting with min assist for transfers. Vitals assessed at end of treatment 153/113 sitting in recliner. RN present and aware of pt vitals.    PT Evaluation Precautions/Restrictions Precautions Precautions: Fall Restrictions Weight Bearing Restrictions: No General   Vital SignsTherapy Vitals Temp: 98.5 F (36.9 C) Temp Source: Oral Pulse Rate: 92 Resp: (!) 24 BP: (!) 144/108 Patient Position (if appropriate): Lying Oxygen Therapy SpO2: 98 % O2 Device: Not Delivered Pain Pain Assessment Pain Assessment: No/denies pain Pain Score: 0-No pain Home Living/Prior Functioning Home Living Available Help at Discharge: Family (Mom works PT 3 days per week) Type of Home: Other(Comment) (Townhome) Home Access: Stairs to enter Technical brewer of Steps: 1 Home Layout: 1/2 bath on main level;Two level;Able to live on main level with bedroom/bathroom Alternate Level Stairs-Number of Steps:  flight Alternate Level Stairs-Rails: Right;Left Bathroom Shower/Tub: Tub/shower unit;Curtain Bathroom Toilet: Standard Bathroom Accessibility: Yes  Lives With: Other (Comment) (Mother) Prior Function Level of Independence: Independent with basic ADLs;Independent with homemaking with ambulation;Independent with gait  Able to Take Stairs?: Yes Driving: No Comments: move from [place] 2 months ago; boyfriend from [facility] Vision/Perception  Perception Perception: Within Functional Limits Praxis Praxis: Intact  Cognition Overall Cognitive Status: Impaired/Different from baseline (Per pt  report, different from baseline) Arousal/Alertness: Awake/alert Orientation Level: Oriented X4;Oriented to person;Oriented to place;Oriented to time;Oriented to situation Memory: Appears intact Awareness: Impaired Awareness Impairment: Anticipatory impairment Problem Solving: Appears intact Executive Function: Sequencing Sequencing: Impaired Sequencing Impairment: Verbal complex;Functional complex Safety/Judgment: Appears intact Sensation Sensation Light Touch: Appears Intact Stereognosis: Appears Intact Hot/Cold: Appears Intact Proprioception: Appears Intact Coordination Gross Motor Movements are Fluid and Coordinated: No Fine Motor Movements are Fluid and Coordinated: Yes Coordination and Movement Description: Severely deconditioned Heel Shin Test: impaired BLE Motor  Motor Motor: Within Functional Limits;Ataxia Motor - Skilled Clinical Observations: Generalized weakness/ deconditioning  Mobility Bed Mobility Bed Mobility: Rolling Right;Rolling Left;Supine to Sit;Sit to Supine Rolling Right: 5: Supervision Rolling Right Details: Verbal cues for sequencing;Verbal cues for technique Rolling Left: 5: Supervision Rolling Left Details: Verbal cues for technique;Verbal cues for sequencing Supine to Sit: 5: Supervision Supine to Sit Details: Verbal cues for sequencing;Verbal cues for technique Sit  to Supine - Details: Verbal cues for sequencing;Verbal cues for technique Transfers Transfers: Yes Sit to Stand: 4: Min assist Sit to Stand Details: Verbal cues for technique;Verbal cues for sequencing Squat Pivot Transfers: 4: Min Risk manager Details: Verbal cues for technique;Verbal cues for precautions/safety;Verbal cues for sequencing Squat Pivot Transfer Details (indicate cue type and reason): no AD  Locomotion  Ambulation Ambulation: Yes Ambulation/Gait Assistance: 3: Mod assist (+2 for IV pole management) Ambulation Distance (Feet): 30 Feet Assistive device: 1 person hand held assist Gait Gait: Yes Gait Pattern: Impaired Gait Pattern: Ataxic;Decreased step length - left;Decreased step length - right;Festinating;Left foot flat;Right foot flat Stairs / Additional Locomotion Stairs: Yes Stairs Assistance: 3: Mod assist Stairs Assistance Details: Verbal cues for precautions/safety;Verbal cues for gait pattern;Verbal cues for safe use of DME/AE Stair Management Technique: Two rails Number of Stairs: 4 Height of Stairs: 6 Wheelchair Mobility Wheelchair Mobility: Yes Wheelchair Assistance: 4: Advertising account executive Details: Verbal cues for safe use of DME/AE;Verbal cues for technique;Verbal cues for Information systems manager: Both upper extremities Wheelchair Parts Management: Needs assistance Distance: 4f   Trunk/Postural Assessment  Cervical Assessment Cervical Assessment: Ex[facility] to [facility](Forward head) Thoracic Assessment Thoracic Assessment: Exceptions to [facility](Rounded shoulders; kyphotic) Lumbar Assessment Lumbar Assessment: Exceptions to [facility](Posterior pelvic tilt) Postural Control Postural Control: Deficits on evaluation (Due to generalized weakness; posterior LOB with dynamic standing tasks)  Balance Balance Balance Assessed: Yes Dynamic Sitting Balance Dynamic Sitting - Balance Support: During functional activity;Feet  supported Dynamic Sitting - Level of Assistance: 5: Stand by assistance Static Standing Balance Static Standing - Balance Support: During functional activity;Bilateral upper extremity supported Static Standing - Level of Assistance: 5: Stand by assistance;4: Min assist Dynamic Standing Balance Dynamic Standing - Balance Support: Right upper extremity supported;Left upper extremity supported;During functional activity Dynamic Standing - Level of Assistance: 4: Min assist;3: Mod assist Extremity Assessment      RLE Assessment RLE Assessment: [facility] to [facility](Grosslt 4+/5 proximal to distal with decreased hip flexion due to body habitus) LLE Assessment LLE Assessment: Ex[facility] to [facility](grossly 4+/5 with decreased ROM and in hip flexion due to  body habitus)   See Function Navigator for Current Functional Status.   Refer to Care Plan for Long Term Goals  Recommendations for other services: Neuropsych and Therapeutic Recreation  Pet therapy, Kitchen group and Stress management  Discharge Criteria: Patient will be discharged from PT if patient refuses treatment 3 consecutive times without medical reason, if treatment goals not met, if there is a change in medical  status, if patient makes no progress towards goals or if patient is discharged from hospital.  The above assessment, treatment plan, treatment alternatives and goals were discussed and mutually agreed upon: by patient  Lorie Phenix 10/13/2016, 12:56 PM

## 2016-10-13 NOTE — Progress Notes (Signed)
Dr. Cato MulliganSwords notified of blood pressures of today.  Systolic 140-150s and diastolic 94-112 when sitting.  Systolic 120s lying and diastolic 70s.  Will continue to monitor as instructed.

## 2016-10-13 NOTE — Progress Notes (Signed)
Erika Boyer is a 21 y.o. female Apr 09, 1996 [id number]  Subjective: New problems last night: SOB, cough. CXR c/u pneumonia - IV abx wre started, IV Lasix. Slept well. Feeling OK now  Objective: Vital signs in last 24 hours: Temp:  [98.1 F (36.7 C)-99.4 F (37.4 C)] 99.4 F (37.4 C) (05/26 0455) Pulse Rate:  [89-104] 97 (05/26 1016) Resp:  [19-32] 30 (05/26 1016) BP: (124-148)/(78-102) 124/81 (05/26 1016) SpO2:  [96 %-100 %] 100 % (05/26 1016) Weight:  [232 lb 6.4 oz (105.4 kg)-275 lb 8 oz (125 kg)] 243 lb 1.6 oz (110.3 kg) (05/26 1042) Weight change:  Last BM Date: 10/12/16  Intake/Output from previous day: 05/25 0701 - 05/26 0700 In: 800 [P.O.:200; IV Piggyback:600] Out: 3300 [Urine:3300] Last cbgs: CBG (last 3)  No results for input(s): GLUCAP in the last 72 hours.   Physical Exam General: No apparent distress  Obese HEENT: not dry Lungs: Normal effort. Lungs clear to auscultation, no crackles or wheezes. Decreased BS at bases Cardiovascular: Regular rate and rhythm, distant; 1+ B LE edema Abdomen: S/NT/ND; BS(+) Musculoskeletal:  Unchanged Neurological: No new neurological deficits Wounds: N/A    Skin: clear  Mental state: Alert, cooperative    Lab Results: BMET    Component Value Date/Time   NA 138 10/13/2016 0443   NA 140 08/23/2016 1548   K 3.2 (L) 10/13/2016 0443   CL 106 10/13/2016 0443   CO2 25 10/13/2016 0443   GLUCOSE 85 10/13/2016 0443   BUN 11 10/13/2016 0443   BUN 9 08/23/2016 1548   CREATININE 0.89 10/13/2016 0443   CALCIUM 8.3 (L) 10/13/2016 0443   GFRNONAA >60 10/13/2016 0443   GFRAA >60 10/13/2016 0443   CBC    Component Value Date/Time   WBC 9.2 10/13/2016 0443   RBC 3.53 (L) 10/13/2016 0443   HGB 8.2 (L) 10/13/2016 0443   HCT 25.8 (L) 10/13/2016 0443   HCT 32.9 (L) 08/23/2016 1548   PLT 369 10/13/2016 0443   PLT 293 08/23/2016 1548   MCV 73.1 (L) 10/13/2016 0443   MCV 78 (L) 08/23/2016 1548   MCH 23.2 (L) 10/13/2016 0443    MCHC 31.8 10/13/2016 0443   RDW 19.1 (H) 10/13/2016 0443   RDW 14.8 08/23/2016 1548   LYMPHSABS 1.3 10/13/2016 0443   LYMPHSABS 1.1 08/23/2016 1548   MONOABS 1.0 10/13/2016 0443   EOSABS 0.0 10/13/2016 0443   EOSABS 0.1 08/23/2016 1548   BASOSABS 0.0 10/13/2016 0443   BASOSABS 0.0 08/23/2016 1548    Studies/Results: Dg Chest 2 View  Result Date: 10/13/2016 CLINICAL DATA:  Productive cough, shortness of breath. EXAM: CHEST  2 VIEW COMPARISON:  Radiograph Oct 12, 2016. FINDINGS: Stable cardiomegaly. No pneumothorax is noted. Stable bilateral pleural effusions are noted, with left larger than the right. Associated atelectasis in both lung bases is noted. Bony thorax is unremarkable. IMPRESSION: Stable cardiomegaly and bilateral pleural effusions, left greater than right, with associated atelectasis or infiltrates. Electronically Signed   By: Lupita RaiderJames  Green Jr, M.D.   On: 10/13/2016 09:13   Dg Chest Port 1 View  Result Date: 10/12/2016 CLINICAL DATA:  Short of breath EXAM: PORTABLE CHEST 1 VIEW COMPARISON:  10/08/2016 FINDINGS: Moderate cardiomegaly with central vascular congestion. Small right pleural effusion. Moderate left pleural effusion slightly increased. Unable to exclude atelectasis or pneumonia at the left base. IMPRESSION: 1. Stable degree of moderate cardiomegaly with mild central congestion 2. Small right pleural effusion. Moderate left pleural effusion slightly increased. Left lower lung atelectasis  or pneumonia. Electronically Signed   By: Jasmine Pang M.D.   On: 10/12/2016 21:56    Medications: I have reviewed the patient's current medications.  Assessment/Plan:  1. Debility due to sepsis - CIR 2. Dyspneic episode last night w/LLL infiltrate on the CXR. We started on IV Zosyn and Vancomycin. Will monitor progress 3. DVT proph w/Lovenox 4. Pleural effusion. S/p thoracentesis. 5. Pericarditis - Dr Jacinto Halim 6. LE edema - Lasix 7. 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 Morbid obesity 9. MR 10. Anxiety - given Lorazepam last night 11. Anemia - on iron  A complex case     Length of stay, days: 1  Sonda Primes , MD 10/13/2016, 10:48 AM

## 2016-10-13 NOTE — Evaluation (Addendum)
Occupational Therapy Assessment and Plan  Patient Details  Name: Tersea Aulds MRN: [id number] Date of Birth: 01/05/1996  OT Diagnosis: abnormal posture, muscle weakness (generalized) and swelling of limb Rehab Potential: Rehab Potential (ACUTE ONLY): Good ELOS: 12-15 days   Today's Date: 10/13/2016 OT Individual Time: 8099-8338 OT Individual Time Calculation (min): 75 min     Problem List:  Patient Active Problem List   Diagnosis Date Noted  . Debility 10/12/2016  . Leg swelling   . Shortness of breath   . Cerebral atrophy   . MR (mental retardation)   . Drug-induced lupus erythematosus   . Tachycardia   . Hypokalemia   . Acute blood loss anemia   . S/P thoracentesis   . Sepsis ([place]) 10/03/2016  . Acute respiratory failure with hypoxia ([place]) 10/03/2016  . CAP (community acquired pneumonia) 10/03/2016  . Hyperthyroidism 10/03/2016  . Morbid obesity with BMI of 40.0-44.9, adult ([place]) 10/03/2016  . Acute kidney injury ([place]) 10/03/2016  . 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 Microcytic anemia 10/03/2016  . Cardiomegaly 10/03/2016  . Acute respiratory failure ([place]) 10/03/2016  . Lobar pneumonia ([place])   . Pleural effusion   . Pleural effusion, left   . Graves disease 08/23/2016    Past Medical History:  Past Medical History:  Diagnosis Date  . Anemia   . Anxiety   . Arthritis    "right knee" (10/11/2016)  . Cerebellar degeneration   . Chronic bronchitis ([place])   . Daily headache   . Depression   . Graves disease 2012   hx; "was on RX; it disappeared" (10/11/2016)  . History of blood transfusion    "related to the anemia" (10/11/2016)  . Hypertension   . Lupus    "not sure what kind" (10/11/2016)  . Pneumonia 10/03/2016   Past Surgical History:  Past Surgical History:  Procedure Laterality Date  . PERICARDIOCENTESIS N/A 10/04/2016   Procedure: Pericardiocentesis;  Surgeon: Adrian Prows, MD;  Location: [facility];  Service: Cardiovascular;  Laterality:  N/A;  . REDUCTION MAMMAPLASTY Bilateral 2012  . TONSILLECTOMY      Assessment & Plan Clinical Impression: Zeynep Fantroy a 21 y.o.femalewith history of, cerebral atrophy due to viral illness at age 77, MR with bouts of agitation, morbid obesity, Graves disease; who was admitted on 10/03/16 with 3 day history of progressive SOB, BLE edema with erythema X 2 weeks and acute renal failure. CT chest reviewed, showing pleural and pericardial effusion. She was found to have large left pleural effusion due to CAP or viral illness as well as large pericardial effusion. she was intubated in ED and underwent left thoracocentesis for evacuation of 850 cc of cloudy exudative pleural effusion. She was placed on IV antibiotics as well as steroids due to concerns of drug induced SLE.. 2D echo with EF 60-65% with severe circumferential effusion with early tamponade and on 5/17 and she underwent pericardiocentesis of 870 cc of serosanguineous fluid. She was placed on colchicine empirically by Dr. Einar Gip due to concerns of acute peritonitis v/s viral pericarditis with recommendations to repeat echo 1-2 days prior to discharge. 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 [facility] hospitalist has discussed case with Dr. Gavin Pound who recommended continuing prednisone 40 mg daily till follow up in office (they will contact patient with appt). Repeat 2 D echo on 5/23 showed EF 55-60% with small to moderate partially loculated pericardial effusion with  fibrinous strands c/w hemopericardium, organized thrombus and no chamber collapse or tamponade. AKI has improved with hydration. Anemia of chronic disease with drop in H/H to 6.8/21.8 treated with on unit PRBC. Her respiratory status and endurance is showing improvement. Therapy ongoing and she was noted to to be debilitated with substantial deficits in balance,  mobility and decreased ability to carry out ADL tasks. CIR recommended for follow up therapy   Patient transferred to CIR on 10/12/2016 .    Patient currently requires mod with basic self-care skills secondary to muscle weakness, decreased cardiorespiratoy endurance, decreased safety awareness and delayed processing and decreased standing balance, decreased postural control and decreased balance strategies.  Prior to hospitalization, patient could complete ADLs with modified independent .  Patient will benefit from skilled intervention to decrease level of assist with basic self-care skills, increase independence with basic self-care skills and increase level of independence with iADL prior to discharge home with care partner.  Anticipate patient will require intermittent supervision and follow up home health.  OT - End of Session Activity Tolerance: Tolerates < 10 min activity with changes in vital signs Endurance Deficit: Yes Endurance Deficit Description: Very deconditioned, HR up to 125BPM following standing trials, rest breaks required throughout seated level ADLs OT Assessment Rehab Potential (ACUTE ONLY): Good OT Patient demonstrates impairments in the following area(s): Balance;Cognition;Edema;Endurance;Safety OT Basic ADL's Functional Problem(s): Grooming;Bathing;Dressing;Toileting OT Advanced ADL's Functional Problem(s): Simple Meal Preparation;Laundry OT Transfers Functional Problem(s): Toilet;Tub/Shower OT Additional Impairment(s): None OT Plan OT Intensity: Minimum of 1-2 x/day, 45 to 90 minutes OT Frequency: 5 out of 7 days OT Treatment/Interventions: Balance/vestibular training;Community reintegration;Cognitive remediation/compensation;Discharge planning;DME/adaptive equipment instruction;Patient/family education;Psychosocial support;Self Care/advanced ADL retraining;Therapeutic Activities;Therapeutic Exercise;UE/LE Strength taining/ROM OT Self Feeding Anticipated Outcome(s): Mod  I OT Basic Self-Care Anticipated Outcome(s): Mod I OT Toileting Anticipated Outcome(s): Mod I OT Bathroom Transfers Anticipated Outcome(s): Supervision-mod I OT Recommendation Recommendations for Other Services: Neuropsych consult;Therapeutic Recreation consult Therapeutic Recreation Interventions: Clinical cytogeneticist;Outing/community reintergration Patient destination: Home Follow Up Recommendations: Home health OT Equipment Recommended: To be determined   Skilled Therapeutic Intervention Pt seen for OT eval and ADL bathing/dressing session. Pt asleep in supine upon arrival, easily awoken and agreeable to tx session and denying pain. She ambulated very short distances throughout session with min A using RW, required multimodal cuing for RW management throughout session and transfers. She required mod-max A for all sit<> stands due to poor descentric control. HR ranging 115-125 following stand pivots and standing trials during ADLs, returned to 105-110 following seated rest breaks. She completed bathing/dressing from w/c level at sink as pt with IV running during tx session. Required increased assist for LB bathing/dressing due to decreased standing balance and limited LE ROM.   Pt returned to recliner at end of session, LEs elevated for edema management and all needs in reach. Pt educated throughout session regarding role of OT, POC, IPR, energy conservation, and d/c planning.   OT Evaluation Precautions/Restrictions  Precautions Precautions: Fall Restrictions Weight Bearing Restrictions: No General Chart Reviewed: Yes Pain   No/ denies pain Home Living/Prior Functioning Home Living Family/patient expects to be discharged to:: Private residence Living Arrangements: Parent (mother ) Available Help at Discharge: Family (Mom works PT 3 days per week) Type of Home: Other(Comment) (Townhome) Home Access: Stairs to enter Technical brewer of Steps: 1 Home Layout: 1/2  bath on main level, Two level, Able to live on main level with bedroom/bathroom Alternate Level Stairs-Number of Steps: flight Alternate Level Stairs-Rails: Right, Left Bathroom Shower/Tub: Tub/shower  unit, Curtain Armed forces training and education officer: Yes  Lives With: Other (Comment) (Mother) IADL History Homemaking Responsibilities: Yes Current License: No Leisure and Hobbies: Pt reports being very sedentary PTA, spending days watching TV, would complete simple meal prep independently Prior Function Level of Independence: Independent with basic ADLs, Independent with homemaking with ambulation, Independent with gait  Able to Take Stairs?: Yes Driving: No Comments: move from [place] 2 months ago; boyfriend from [place] Vision/History: No visual deficits Patient Visual Report: No change from baseline Vision Assessment?: No apparent visual deficits Perception  Perception: Within Functional Limits Praxis Praxis: Intact Cognition Overall Cognitive Status: Impaired/Different from baseline (Per pt report, different from baseline) Arousal/Alertness: Awake/alert Orientation Level: Person;Place;Situation Person: Oriented Place: Oriented Situation: Oriented Year: 2018 Month: May Day of Week: Incorrect Memory: Appears intact Immediate Memory Recall: Sock;Blue;Bed Memory Recall: Sock;Blue;Bed Memory Recall Sock: Without Cue Memory Recall Blue: Without Cue Memory Recall Bed: Without Cue Awareness: Impaired Awareness Impairment: Anticipatory impairment Problem Solving: Appears intact Executive Function: Sequencing Sequencing: Impaired Sequencing Impairment: Verbal complex;Functional complex Safety/Judgment: Appears intact Sensation Sensation Light Touch: Appears Intact Stereognosis: Appears Intact Hot/Cold: Appears Intact Proprioception: Appears Intact Coordination Gross Motor Movements are Fluid and Coordinated: No Fine Motor Movements are Fluid and  Coordinated: Yes Coordination and Movement Description: Severely deconditioned Motor  Motor Motor: Within Functional Limits Motor - Skilled Clinical Observations: Generalized weakness/ deconditioning Trunk/Postural Assessment  Cervical Assessment Cervical Assessment: Exceptions to [facility] (Forward head) Thoracic Assessment Thoracic Assessment: Exceptions to [facility] (Rounded shoulders; kyphotic) Lumbar Assessment Lumbar Assessment: Exceptions to [facility] (Posterior pelvic tilt) Postural Control Postural Control: Deficits on evaluation (Due to generalized weakness; posterior LOB with dynamic standing tasks)  Balance Balance Balance Assessed: Yes Dynamic Sitting Balance Dynamic Sitting - Balance Support: During functional activity;Feet supported Dynamic Sitting - Level of Assistance: 5: Stand by assistance Static Standing Balance Static Standing - Balance Support: During functional activity;Bilateral upper extremity supported Static Standing - Level of Assistance: 5: Stand by assistance;4: Min assist Dynamic Standing Balance Dynamic Standing - Balance Support: Right upper extremity supported;Left upper extremity supported;During functional activity Dynamic Standing - Level of Assistance: 4: Min assist;3: Mod assist Dynamic Standing - Comments: Standing to complete LB Bathing/dressing tasks Extremity/Trunk Assessment RUE Assessment RUE Assessment: Exceptions to [facility] RUE AROM (degrees) Overall AROM Right Upper Extremity: Within functional limits for tasks performed RUE Strength RUE Overall Strength: Deficits (3+/5 throughout) LUE Assessment LUE Assessment: Exceptions to WFL LUE AROM (degrees) Overall AROM Left Upper Extremity: Within functional limits for tasks assessed LUE Strength LUE Overall Strength: Deficits (3+/5 throughout)   See Function Navigator for Current Functional Status.   Refer to Care Plan for Long Term Goals  Recommendations for other services: Neuropsych and Therapeutic  Recreation  Kitchen group, Stress management and Outing/community reintegration   Discharge Criteria: Patient will be discharged from OT if patient refuses treatment 3 consecutive times without medical reason, if treatment goals not met, if there is a change in medical status, if patient makes no progress towards goals or if patient is discharged from hospital.  The above assessment, treatment plan, treatment alternatives and goals were discussed and mutually agreed upon: by patient  Ernestina Patches 10/13/2016, 8:44 AM

## 2016-10-13 NOTE — Progress Notes (Signed)
Per Aubery Lappingenise Lloyd, RN, Pt arrived to unit at 1717. RN educated and reviewed rehab process and safety plan with pt and with pt's Mother Jamesetta Sohyllis on the phone, admission completed with assist from Mother.

## 2016-10-13 NOTE — Progress Notes (Signed)
Shift summary 7p-0130--During bedside report @ 1930,  pt was on phone with Mother, upset and yelling.  Pt state she is not breathing right and that she feels the same as she felt when she had fluid on her heart.  HR 92/regular, lungs clear, diminished to bases, O2 sat 100%. Called respiratory for pt's prn breathing treatment, after the breathing treatment at 2025, pt assessment completed, pt state L back pain/shoulder area, state she has had that for a while, tachypnea, RR 32, HR 102, O2 100% on 2L per [place], BP 130/78,T-98.8 administered tylenol and trazodone for pt's pain and to help her relax, however, at 2100, pt breathing remained tachypneic, and pt SOB while talking, call to Dr Posey ReaPlotnikov On call for Dr Riley KillSwartz, orders for chest xray, Ativan, rec'd.  lorezepam given at 2130 and pt was still breathing 32/min, call to Rapid Response nurse to come assess pt.  At 2220, call to Dr Posey ReaPlotnikov with RR nurse assessment of rales to lungs, plus chest xray results and orders rec'd.  Pt is sleeping and when awake states she is feeling better. Pt has voided 2100ml of urine since lasix dose at 2330. Pt is resting quietly now, RR 24/min during sleep. O2 2L per [place], Temp 99 orrally.

## 2016-10-13 NOTE — Evaluation (Signed)
Speech Language Pathology Assessment and Plan  Patient Details  Name: Erika Boyer MRN: [id number] Date of Birth: [birth year]  SLP Diagnosis: Cognitive Impairments  Rehab Potential: Good ELOS: 10-14 days     Today's Date: 10/13/2016 SLP Individual Time: 1305-1400 SLP Individual Time Calculation (min): 55 min   Problem List:  Patient Active Problem List   Diagnosis Date Noted  . Debility 10/12/2016  . Leg swelling   . Shortness of breath   . Cerebral atrophy   . MR (mental retardation)   . Drug-induced lupus erythematosus   . Tachycardia   . Hypokalemia   . Acute blood loss anemia   . S/P thoracentesis   . Sepsis ([place]) 10/03/2016  . Acute respiratory failure with hypoxia ([place]) 10/03/2016  . CAP (community acquired pneumonia) 10/03/2016  . Hyperthyroidism 10/03/2016  . Morbid obesity with BMI of 40.0-44.9, adult ([place]) 10/03/2016  . Acute kidney injury ([place]) 10/03/2016  . 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 Microcytic anemia 10/03/2016  . Cardiomegaly 10/03/2016  . Acute respiratory failure ([place]) 10/03/2016  . Lobar pneumonia ([place])   . Pleural effusion   . Pleural effusion, left   . Graves disease 08/23/2016   Past Medical History:  Past Medical History:  Diagnosis Date  . Anemia   . Anxiety   . Arthritis    "right knee" (10/11/2016)  . Cerebellar degeneration   . Chronic bronchitis ([place])   . Daily headache   . Depression   . Graves disease 2012   hx; "was on RX; it disappeared" (10/11/2016)  . History of blood transfusion    "related to the anemia" (10/11/2016)  . Hypertension   . Lupus    "not sure what kind" (10/11/2016)  . Pneumonia 10/03/2016   Past Surgical History:  Past Surgical History:  Procedure Laterality Date  . PERICARDIOCENTESIS N/A 10/04/2016   Procedure: Pericardiocentesis;  Surgeon: Adrian Prows, MD;  Location: [place] [facility];  Service: Cardiovascular;  Laterality: N/A;  . REDUCTION MAMMAPLASTY Bilateral 2012  . TONSILLECTOMY       Assessment / Plan / Recommendation Clinical Impression Erika Boyer a 21 y.o.femalewith history of, cerebral atrophy due to viral illness at age 21, MR with bouts of agitation, morbid obesity, Graves disease; who was admitted on 10/03/16 with 3 day history of progressive SOB, BLE edema with erythema X 2 weeks and acute renal failure. CT chest reviewed, showing pleural and pericardial effusion. She was found to have large left pleural effusion due to CAP or viral illness as well as large pericardial effusion. she was intubated in ED and underwent left thoracocentesis for evacuation of 850 cc of cloudy exudative pleural effusion. She was placed on IV antibiotics as well as steroids due to concerns of drug induced SLE.2D echo with EF 60-65% with severe circumferential effusion with early tamponade and on 5/17 and she underwent pericardiocentesis of 870 cc of serosanguineous fluid. She was placed on colchicine empirically by Dr. Einar Gip due to concerns of acute peritonitis v/s viral pericarditis with recommendations to repeat echo 1-2 days prior to discharge. 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 [facility] hospitalist has discussed case with Dr. Gavin Pound who recommended continuing prednisone 40 mg daily till follow up in office (they will contact patient with appt). Repeat 2 D echo on 5/23 showed EF 55-60% with small to moderate partially loculated pericardial effusion with fibrinous strands c/w hemopericardium, organized thrombus and no chamber  collapse or tamponade. AKI has improved with hydration. Anemia of chronic disease with drop in H/H to 6.8/21.8 treated with on unit PRBC. Her respiratory status and endurance is showing improvement. Therapy ongoing and she was noted to to be debilitated with substantial deficits in balance, mobility and decreased ability to carry out ADL tasks. CIR  recommended for follow up therapy and patient admitted 10/12/16.    Cognitive-linguistic evaluation complete.  Patient was administered the [facility] Cognitive Assessment, basic and demonstrates skills consistent with a score of 28/30 which is considered to be [facility]; however, functional impairments were noted during session in the areas of recall of new information, and anticipatory awareness.  This impacts the patient's overall safety with functional self-care tasks. As a result, patient would benefit from skilled SLP intervention in order to maximize her functional independence prior to discharge. Hopeful that patient will require intermittent supervision; supervision with complex tasks.    Skilled Therapeutic Interventions          Skilled treatment initiated with focus on addressing cognition goals. SLP facilitated session by providing Min physical assist and Mod assist verbal cues for patient to recall safe hand placement during sit to stand and stand to sit transfers.  With repetitions SLP was able to fade cues to Min assist during session.  Continue with current plan of care.    SLP Assessment  Patient will need skilled Speech Lanaguage Pathology Services during CIR admission    Recommendations  Follow up Recommendations: Other (comment);Outpatient SLP (Intermittent Supervision ) Equipment Recommended: None recommended by SLP    SLP Frequency 3 to 5 out of 7 days   SLP Duration  SLP Intensity  SLP Treatment/Interventions 10-14 days   Minumum of 1-2 x/day, 30 to 90 minutes  Cognitive remediation/compensation;Cueing hierarchy;Environmental controls;Functional tasks;Medication managment;Patient/family education    Pain Pain Assessment Pain Assessment: No/denies pain  Prior Functioning Cognitive/Linguistic Baseline: Baseline deficits Baseline deficit details: managed only basic task PTA Type of Home: Other(Comment) (Townhouse)  Lives With: Other (Comment) (Mom) Available Help at  Discharge: Family  Function:  Eating Eating   Modified Consistency Diet: No     Eating Set Up Assist For: Opening containers       Cognition Comprehension Comprehension assist level: Understands basic 90% of the time/cues < 10% of the time  Expression   Expression assist level: Expresses basic 90% of the time/requires cueing < 10% of the time.  Social Interaction Social Interaction assist level: Interacts appropriately 75 - 89% of the time - Needs redirection for appropriate language or to initiate interaction.  Problem Solving Problem solving assist level: Solves basic 50 - 74% of the time/requires cueing 25 - 49% of the time  Memory Memory assist level: Recognizes or recalls 90% of the time/requires cueing < 10% of the time   Short Term Goals: Week 1: SLP Short Term Goal 1 (Week 1): Patient will safely sequence transfers with recall for hand placement with walker during sit to stand and stand to sit with Supervision assist verbal cues SLP Short Term Goal 2 (Week 1): Patient will demonstrate ability to recall current medications, functions, and frequencies with Min assist verbal cues to utilize external aid SLP Short Term Goal 3 (Week 1): Patient will recall recommended diet restrictions with Supervision assist to multimodal cues SLP Short Term Goal 4 (Week 1): Patient will utilize working memory strategies for accurate completion of multi-step directions with Min assist verbal cues   Refer to Care Plan for Long Term Goals  Recommendations  for other services: None   Discharge Criteria: Patient will be discharged from SLP if patient refuses treatment 3 consecutive times without medical reason, if treatment goals not met, if there is a change in medical status, if patient makes no progress towards goals or if patient is discharged from hospital.  The above assessment, treatment plan, treatment alternatives and goals were discussed and mutually agreed upon: by patient  Gunnar Fusi,  M.A., CCC-SLP [place] 10/13/2016, 4:26 PM

## 2016-10-14 ENCOUNTER — Inpatient Hospital Stay (HOSPITAL_COMMUNITY): Payer: Medicare Other | Admitting: Occupational Therapy

## 2016-10-14 LAB — VANCOMYCIN, TROUGH: Vancomycin Tr: 30 ug/mL (ref 15–20)

## 2016-10-14 LAB — URINE CULTURE

## 2016-10-14 MED ORDER — VANCOMYCIN HCL 10 G IV SOLR
1250.0000 mg | Freq: Two times a day (BID) | INTRAVENOUS | Status: DC
  Administered 2016-10-14 (×2): 1250 mg via INTRAVENOUS
  Filled 2016-10-14 (×3): qty 1250

## 2016-10-14 MED ORDER — FUROSEMIDE 10 MG/ML IJ SOLN
20.0000 mg | Freq: Once | INTRAMUSCULAR | Status: AC
  Administered 2016-10-14: 20 mg via INTRAVENOUS
  Filled 2016-10-14: qty 2

## 2016-10-14 NOTE — Progress Notes (Signed)
Erika Boyer is a 21 y.o. female 11-07-1995 [id number]  Subjective: Looks "brighter" per RNs. Slept well. Feeling OK. SOB w/exertion  Objective: Vital signs in last 24 hours: Temp:  [97.6 F (36.4 C)-98.5 F (36.9 C)] 98.3 F (36.8 C) (05/27 0432) Pulse Rate:  [89-112] 105 (05/27 0738) Resp:  [18-24] 18 (05/27 0432) BP: (137-151)/(90-112) 140/108 (05/27 0738) SpO2:  [98 %-99 %] 98 % (05/27 0432) Weight:  [245 lb 7.4 oz (111.3 kg)] 245 lb 7.4 oz (111.3 kg) (05/27 0947) Weight change: -43 lb 1.6 oz (-19.55 kg) Last BM Date: 10/13/16  Intake/Output from previous day: 05/26 0701 - 05/27 0700 In: 462 [P.O.:462] Out: 901 [Urine:901] Last cbgs: CBG (last 3)  No results for input(s): GLUCAP in the last 72 hours.   Physical Exam General: No apparent distress  Obese HEENT: not dry Lungs: Normal effort. Lungs w/decr BS at bases, no crackles or wheezes. Cardiovascular: Regular rate and rhythm, tachy; 1+ edema Abdomen: S/NT/ND; BS(+) Musculoskeletal:  unchanged Neurological: No new neurological deficits Wounds: N/A    Skin: clear  Aging changes Mental state: Alert, cooperative    Lab Results: BMET    Component Value Date/Time   NA 138 10/13/2016 0443   NA 140 08/23/2016 1548   K 3.2 (L) 10/13/2016 0443   CL 106 10/13/2016 0443   CO2 25 10/13/2016 0443   GLUCOSE 85 10/13/2016 0443   BUN 11 10/13/2016 0443   BUN 9 08/23/2016 1548   CREATININE 0.89 10/13/2016 0443   CALCIUM 8.3 (L) 10/13/2016 0443   GFRNONAA >60 10/13/2016 0443   GFRAA >60 10/13/2016 0443   CBC    Component Value Date/Time   WBC 9.2 10/13/2016 0443   RBC 3.53 (L) 10/13/2016 0443   HGB 8.2 (L) 10/13/2016 0443   HCT 25.8 (L) 10/13/2016 0443   HCT 32.9 (L) 08/23/2016 1548   PLT 369 10/13/2016 0443   PLT 293 08/23/2016 1548   MCV 73.1 (L) 10/13/2016 0443   MCV 78 (L) 08/23/2016 1548   MCH 23.2 (L) 10/13/2016 0443   MCHC 31.8 10/13/2016 0443   RDW 19.1 (H) 10/13/2016 0443   RDW 14.8 08/23/2016  1548   LYMPHSABS 1.3 10/13/2016 0443   LYMPHSABS 1.1 08/23/2016 1548   MONOABS 1.0 10/13/2016 0443   EOSABS 0.0 10/13/2016 0443   EOSABS 0.1 08/23/2016 1548   BASOSABS 0.0 10/13/2016 0443   BASOSABS 0.0 08/23/2016 1548    Studies/Results: Dg Chest 2 View  Result Date: 10/13/2016 CLINICAL DATA:  Productive cough, shortness of breath. EXAM: CHEST  2 VIEW COMPARISON:  Radiograph Oct 12, 2016. FINDINGS: Stable cardiomegaly. No pneumothorax is noted. Stable bilateral pleural effusions are noted, with left larger than the right. Associated atelectasis in both lung bases is noted. Bony thorax is unremarkable. IMPRESSION: Stable cardiomegaly and bilateral pleural effusions, left greater than right, with associated atelectasis or infiltrates. Electronically Signed   By: Lupita RaiderJames  Green Jr, M.D.   On: 10/13/2016 09:13   Dg Chest Port 1 View  Result Date: 10/12/2016 CLINICAL DATA:  Short of breath EXAM: PORTABLE CHEST 1 VIEW COMPARISON:  10/08/2016 FINDINGS: Moderate cardiomegaly with central vascular congestion. Small right pleural effusion. Moderate left pleural effusion slightly increased. Unable to exclude atelectasis or pneumonia at the left base. IMPRESSION: 1. Stable degree of moderate cardiomegaly with mild central congestion 2. Small right pleural effusion. Moderate left pleural effusion slightly increased. Left lower lung atelectasis or pneumonia. Electronically Signed   By: Jasmine PangKim  Fujinaga M.D.   On: 10/12/2016 21:56  Medications: I have reviewed the patient's current medications.  Assessment/Plan:  1. Debility caused by sepsis - CIR 2.  Dyspneic episode on Fri night w/LLL infiltrate on the CXR. We started on IV Zosyn and Vancomycin. Clinically better. Will cont to monitor progress. May need an ID consult. 3. DVT proph - Lovenox 4. CHF. PO Lasix. IV Lasix today 5. SLE on Prednisone 6. Anxiety - Lorazepam prn.    Length of stay, days: 2  Sonda Primes , MD 10/14/2016, 10:48 AM

## 2016-10-14 NOTE — Plan of Care (Signed)
Problem: RH SKIN INTEGRITY Goal: RH STG SKIN FREE OF INFECTION/BREAKDOWN Outcome: Progressing No skin breakdown or infection noted Goal: RH STG MAINTAIN SKIN INTEGRITY WITH ASSISTANCE STG Maintain Skin Integrity With Assistance.  Outcome: Progressing No skin issues noted  Problem: RH SAFETY Goal: RH STG ADHERE TO SAFETY PRECAUTIONS W/ASSISTANCE/DEVICE STG Adhere to Safety Precautions With Assistance/Device.  Outcome: Not Progressing Patient understand safety precautions Goal: RH OTHER STG SAFETY GOALS W/ASSIST Other STG Safety Goals With Assistance.  Outcome: Progressing Safety precautions maintained

## 2016-10-14 NOTE — IPOC Note (Addendum)
Overall Plan of Care [facility]) Patient Details Name: Erika Boyer MRN: [id number] DOB: [birth year]  Admitting Diagnosis: debility  Hospital Problems: Active Problems:   Debility     Functional Problem List: Nursing Behavior, Bladder, Bowel, Pain, Safety, Skin Integrity  PT Balance, Edema, Endurance, Motor, Pain, Perception, Safety, Skin Integrity, Sensory, Behavior, Nutrition  OT Balance, Cognition, Edema, Endurance, Safety  SLP Cognition  TR         Basic ADL's: OT Grooming, Bathing, Dressing, Toileting     Advanced  ADL's: OT Simple Meal Preparation, Laundry     Transfers: PT Bed Mobility, Bed to Chair, Car, [employer], Civil Service fast streamer, Research scientist (life sciences): PT Ambulation, Psychologist, prison and probation services, Stairs     Additional Impairments: OT None  SLP Social Cognition   Problem Solving, Memory, Awareness  TR      Anticipated Outcomes Item Anticipated Outcome  Self Feeding Mod I  Swallowing      Basic self-care  Mod I  Toileting  Mod I   Bathroom Transfers Supervision-mod I  Bowel/Bladder  Pt will remain cont b/b, staff assist with toileting   Transfers  Mod I with LRAD   Locomotion  Mod I at household distance with LRAD   Communication     Cognition  Supervision for complex   Pain  Pt less than 5 to being free of pain. staff administered prn pain regimen as needed   Safety/Judgment  Free of injury and fall    Therapy Plan: PT Intensity: Minimum of 1-2 x/day ,45 to 90 minutes PT Frequency: 5 out of 7 days PT Duration Estimated Length of Stay: 12-15 Days OT Intensity: Minimum of 1-2 x/day, 45 to 90 minutes OT Frequency: 5 out of 7 days OT Duration/Estimated Length of Stay: 12-15 days SLP Intensity: Minumum of 1-2 x/day, 30 to 90 minutes SLP Frequency: 3 to 5 out of 7 days SLP Duration/Estimated Length of Stay: 10-14 days        Team Interventions: Nursing Interventions Patient/Family Education, Bladder Management, Bowel Management, Pain  Management, Medication Management, Psychosocial Support  PT interventions Ambulation/gait training, Balance/vestibular training, Cognitive remediation/compensation, Disease management/prevention, Discharge planning, Community reintegration, DME/adaptive equipment instruction, Functional electrical stimulation, Functional mobility training, Neuromuscular re-education, Pain management, Patient/family education, Psychosocial support, Skin care/wound management, Stair training, Therapeutic Activities, Therapeutic Exercise, Splinting/orthotics, UE/LE Strength taining/ROM, UE/LE Coordination activities, Visual/perceptual remediation/compensation, Wheelchair propulsion/positioning  OT Interventions Warden/ranger, Firefighter, Cognitive remediation/compensation, Discharge planning, Fish farm manager, Patient/family education, Psychosocial support, Self Care/advanced ADL retraining, Therapeutic Activities, Therapeutic Exercise, UE/LE Strength taining/ROM  SLP Interventions Cognitive remediation/compensation, Financial trader, Environmental controls, Functional tasks, Medication managment, Patient/family education  TR Interventions    SW/CM Interventions Discharge Planning, Psychosocial Support, Patient/Family Education    Team Discharge Planning: Destination: PT-Home ,OT- Home , SLP- Home Projected Follow-up: PT-Home health PT, OT-  Home health OT, SLP-Other (comment), Outpatient SLP (Intermittent Supervision ) Projected Equipment Needs: PT-Rolling walker with 5" wheels, Wheelchair cushion (measurements), Wheelchair (measurements), To be determined, OT- To be determined, SLP-None recommended by SLP Equipment Details: PT- , OT-  Patient/family involved in discharge planning: PT- Patient,  OT-Patient, SLP-Patient  MD ELOS: 12-15 days Medical Rehab Prognosis:  Excellent Assessment: The patient has been admitted for CIR therapies with the diagnosis of debility due to  sepsis/lupus. The team will be addressing functional mobility, strength, stamina, balance, safety, adaptive techniques and equipment, self-care, bowel and bladder mgt, patient and caregiver education, pain control, symptom mgt, activity tolerance, cognition, communication, community reintegration. Goals have  been set at mod I to supervision with mobility, self-care and cognition. She has a supportive family.    Ranelle OysterZachary T. Swartz, MD, FAAPMR      See Team Conference Notes for weekly updates to the plan of care

## 2016-10-14 NOTE — Progress Notes (Signed)
Occupational Therapy Session Note  Patient Details  Name: Baxter FlatteryOmaure Halle MRN: [id number] Date of Birth: 1996/04/05  Today's Date: 10/14/2016 OT Individual Time: 1115-1200 OT Individual Time Calculation (min): 45 min    Short Term Goals: Week 1:  OT Short Term Goal 1 (Week 1): Pt will complete 3 grooming tasks in standing in order to increase functional activity tolerance OT Short Term Goal 2 (Week 1): Pt will dress LB with set-up/ dupervision using AE PRN OT Short Term Goal 3 (Week 1): Pt will complete 3/3 toileting tasks with supervision OT Short Term Goal 4 (Week 1): Pt will tolerate full bathing/dressing session with no more than 2 rest breaks  Skilled Therapeutic Interventions/Progress Updates:    Treatment session with focus on standing balance and activity tolerance.  Pt declined bathing and dressing this session.  Completed stand pivot transfers min guard.  In therapy gym engaged in Wii activity with focus on sit <> stand and standing balance.  Supervision for all sit <> stands and min assist during standing due to instability in standing without UE support.  Pt tolerated standing ~5 mins at a time before requiring seated rest break.  Returned to room and left seated EOB with all needs in reach.  Therapy Documentation Precautions:  Precautions Precautions: Fall Restrictions Weight Bearing Restrictions: No Pain:  Pt with c/o pain in back, premedicated.  See Function Navigator for Current Functional Status.   Therapy/Group: Individual Therapy  Rosalio LoudHOXIE, Donta Fuster 10/14/2016, 12:51 PM

## 2016-10-14 NOTE — Progress Notes (Signed)
Pharmacy Antibiotic Note Erika Boyer is a 21 y.o. female admitted on 10/12/2016. Currently on day 2 of Zosyn and vancomycin for treatment on pneumonia.   Vancomycin trough of 30 is high than desired (goal 15-20). SCr stable  Plan: 1. Adjust vancomycin to 1250 mg IV every 12 hours  2. Continue Zosyn 3.375 grams IV every 8 hours (infused over 4 hours) 3. Follow up cx data and narrow abx as feasible   Height: 5\' 5"  (165.1 cm) Weight: 243 lb 1.6 oz (110.3 kg) IBW/kg (Calculated) : 57  Temp (24hrs), Avg:98.5 F (36.9 C), Min:97.6 F (36.4 C), Max:99.4 F (37.4 C)   Recent Labs Lab 10/08/16 1457 10/09/16 0655 10/09/16 2345 10/10/16 0427 10/11/16 0531 10/11/16 0956 10/13/16 0443 10/13/16 2327  WBC 8.5  --   --  7.4 7.2 8.0 9.2  --   CREATININE  --  0.86 0.83 0.79 0.86  --  0.89  --   VANCOTROUGH  --   --   --   --   --   --   --  30*    Estimated Creatinine Clearance: 124.6 mL/min (by C-G formula based on SCr of 0.89 mg/dL).    Allergies  Allergen Reactions  . Shellfish Allergy Anaphylaxis  . Morphine And Related Itching  . Mushroom Extract Complex Other (See Comments)    Mother is allergic, so patient avoids these  . Tapazole [Methimazole] Swelling and Rash    WELTS (also) Mother suspects this MAY be an allergy, as the onset of symptoms coincided with this being started    Antimicrobials this admission: 5/25 vancomycin>> 5/25 Zosyn>>  Dose adjustments this admission: 5/27 VT 30 on 1250 mg q8h >> 1250 mg q 12h  Microbiology results: 5/25 UCx: px    Thank you for allowing pharmacy to be a part of this patient's care.  Pollyann SamplesAndy Makenlee Mckeag, PharmD, BCPS 10/14/2016, 12:44 AM

## 2016-10-15 ENCOUNTER — Inpatient Hospital Stay (HOSPITAL_COMMUNITY): Payer: Medicare Other | Admitting: Physical Therapy

## 2016-10-15 ENCOUNTER — Inpatient Hospital Stay (HOSPITAL_COMMUNITY): Payer: Medicare Other | Admitting: Speech Pathology

## 2016-10-15 ENCOUNTER — Inpatient Hospital Stay (HOSPITAL_COMMUNITY): Payer: Medicare Other | Admitting: Occupational Therapy

## 2016-10-15 DIAGNOSIS — E876 Hypokalemia: Secondary | ICD-10-CM

## 2016-10-15 LAB — BASIC METABOLIC PANEL
Anion gap: 9 (ref 5–15)
BUN: 11 mg/dL (ref 6–20)
CO2: 29 mmol/L (ref 22–32)
Calcium: 8.2 mg/dL — ABNORMAL LOW (ref 8.9–10.3)
Chloride: 102 mmol/L (ref 101–111)
Creatinine, Ser: 1.16 mg/dL — ABNORMAL HIGH (ref 0.44–1.00)
GFR calc Af Amer: >60 mL/min (ref >60)
GFR calc non Af Amer: >60 mL/min (ref >60)
GLUCOSE: 83 mg/dL (ref 65–99)
POTASSIUM: 3.3 mmol/L — AB (ref 3.5–5.1)
Sodium: 140 mmol/L (ref 135–145)

## 2016-10-15 MED ORDER — METOPROLOL TARTRATE 25 MG PO TABS
25.0000 mg | ORAL_TABLET | Freq: Once | ORAL | Status: AC
  Administered 2016-10-15: 25 mg via ORAL
  Filled 2016-10-15: qty 1

## 2016-10-15 MED ORDER — CLONIDINE HCL 0.1 MG PO TABS
0.1000 mg | ORAL_TABLET | Freq: Three times a day (TID) | ORAL | Status: DC | PRN
  Administered 2016-10-15 – 2016-10-23 (×8): 0.1 mg via ORAL
  Filled 2016-10-15 (×10): qty 1

## 2016-10-15 MED ORDER — METOPROLOL TARTRATE 50 MG PO TABS
50.0000 mg | ORAL_TABLET | Freq: Two times a day (BID) | ORAL | Status: DC
  Administered 2016-10-15 – 2016-10-16 (×2): 50 mg via ORAL
  Filled 2016-10-15 (×2): qty 1

## 2016-10-15 NOTE — Progress Notes (Signed)
[place]     PROGRESS NOTE    Subjective/Complaints: Had a fair night. Was restless. Low back sore when she lays in bed. Breathing is improving.   ROS: pt denies nausea, vomiting, diarrhea, cough, shortness of breath or chest pain   Objective: Vital Signs: Blood pressure (!) 141/102, pulse 87, temperature 98.5 F (36.9 C), temperature source Oral, resp. rate 18, height 5\' 5"  (1.651 m), weight 111.3 kg (245 lb 7.4 oz), last menstrual period 10/08/2016, SpO2 97 %. No results found.  Recent Labs  10/13/16 0443  WBC 9.2  HGB 8.2*  HCT 25.8*  PLT 369    Recent Labs  10/13/16 0443 10/15/16 0602  NA 138 140  K 3.2* 3.3*  CL 106 102  GLUCOSE 85 83  BUN 11 11  CREATININE 0.89 1.16*  CALCIUM 8.3* 8.2*   CBG (last 3)  No results for input(s): GLUCAP in the last 72 hours.  Wt Readings from Last 3 Encounters:  10/14/16 111.3 kg (245 lb 7.4 oz)  10/12/16 122.5 kg (270 lb)  08/23/16 118.5 kg (261 lb 3.2 oz)    Physical Exam:  Constitutional: She is oriented to person, place, and time. She appears well-developedand well-nourished. No distress.  HENT:  Head: Normocephalicand atraumatic.  Mouth/Throat: Oropharynx is clear and moist.  Eyes: Conjunctivaeare normal. Pupils are equal, round, and reactive to light.  Neck: Normal range of motion. Neck supple.  Cardiovascular: RRR  Respiratory: lungs with a few scattered wheezes. Breathing more easily. No problems with conversation. Decreased sounds at bases GI: Soft. Bowel sounds are normal. She exhibits no distension. There is no rebound.  Musculoskeletal: She exhibits edema. She exhibits no tenderness.  2+ edema BLE Neurological: She is alertand oriented to person, place, and time.  Skin: Skin is warmand dry. She is not diaphoretic.  Psychiatric: She has a normal mood and affect. Her behavior is normal. Thought contentnormal.   Assessment/Plan: 1. Functional and mobility deficits  secondary to sepsis/debility which require 3+ hours per day of interdisciplinary therapy in a comprehensive inpatient rehab setting. Physiatrist is providing close team supervision and 24 hour management of active medical problems listed below. Physiatrist and rehab team continue to assess barriers to discharge/monitor patient progress toward functional and medical goals.  Function:  Bathing Bathing position   Position: Wheelchair/chair at sink  Bathing parts Body parts bathed by patient: Right arm, Left arm, Chest, Abdomen, Front perineal area, Buttocks, Right upper leg, Left upper leg Body parts bathed by helper: Right lower leg, Left lower leg, Back  Bathing assist Assist Level: Touching or steadying assistance(Pt > 75%)      Upper Body Dressing/Undressing Upper body dressing   What is the patient wearing?: Pull over shirt/dress     Pull over shirt/dress - Perfomed by patient: Thread/unthread left sleeve Pull over shirt/dress - Perfomed by helper: Thread/unthread right sleeve, Put head through opening, Pull shirt over trunk        Upper body assist        Lower Body Dressing/Undressing Lower body dressing   What is the patient wearing?: Underwear, Pants, Non-skid slipper socks Underwear - Performed by patient: Pull underwear up/down Underwear - Performed by helper: Thread/unthread right underwear leg, Thread/unthread left underwear leg Pants- Performed by patient: Pull pants up/down Pants- Performed by helper: Thread/unthread right pants leg, Thread/unthread left pants leg   Non-skid slipper socks- Performed by helper: Don/doff right sock, Don/doff left sock  Lower body assist Assist for lower body dressing: Touching or steadying assistance (Pt > 75%)      Toileting Toileting   Toileting steps completed by patient: Adjust clothing prior to toileting, Adjust clothing after toileting, Performs perineal hygiene   Toileting Assistive Devices: Grab bar  or rail  Toileting assist Assist level: Touching or steadying assistance (Pt.75%)   Transfers Chair/bed transfer   Chair/bed transfer method: Stand pivot Chair/bed transfer assist level: Touching or steadying assistance (Pt > 75%) Chair/bed transfer assistive device: Armrests     Locomotion Ambulation     Max distance: 30 Assist level: Touching or steadying assistance (Pt > 75%)   Wheelchair   Type: Manual Max wheelchair distance: 4650ft  Assist Level: Touching or steadying assistance (Pt > 75%)  Cognition Comprehension Comprehension assist level: Understands basic 90% of the time/cues < 10% of the time  Expression Expression assist level: Expresses basic 90% of the time/requires cueing < 10% of the time.  Social Interaction Social Interaction assist level: Interacts appropriately 75 - 89% of the time - Needs redirection for appropriate language or to initiate interaction.  Problem Solving Problem solving assist level: Solves basic 50 - 74% of the time/requires cueing 25 - 49% of the time  Memory Memory assist level: Recognizes or recalls 90% of the time/requires cueing < 10% of the time   Medical Problem List and Plan: 1. Debility secondary to Sepsis, lupuswith multiple medical issues.  -admit to inpatient rehab 2. DVT Prophylaxis/Anticoagulation: Pharmaceutical: Lovenox 3. Pain Management: tylenol prn as well as increase in mobility should help with back pain.  4. Mood: LCSW to follow for evaluation and support.  5. Neuropsych: This patient is not fully capable of making decisions on hisown behalf. 6. Skin/Wound Care: routine pressure relief measures.   protein supplement for low protein stores and to help with edema.  7. Fluids/Electrolytes/Nutrition:  Replace potassium  -encourage PO 8. CAP with SOB: nebulizes, IS/FV  -5/25 with ?LLL > RLL infiltrate/effusions  -vanc/zosyn started---has been afebrile. Doing better since episode Friday  -will narrow to  zosyn. Follow up CXR today  -lasix iniitated as well over weekend 9. Pleural effusion:   repeat CXR 5/28. consider thoracocentesis if still present or worse. 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  prednisone 40 mg daily- . To follow up with Dr. Nickola MajorHawkes after dischage 11. Peripheral edema: Monitor weights daily.  12. Iron deficiency anemia: Improved with transfusion. Add iron supplement. :  13. Tachycardia: Monitor BP/HR bid --continue metoprolol bid.  14. Hyperthyroid: off methamazole.    LOS (Days) 3 A FACE TO FACE EVALUATION WAS PERFORMED  Ranelle OysterSWARTZ,ZACHARY T, MD 10/15/2016 9:21 AM

## 2016-10-15 NOTE — Progress Notes (Signed)
Speech Language Pathology Daily Session Note  Patient Details  Name: Erika Boyer MRN: [id number] Date of Birth: 11-28-1995  Today's Date: 10/15/2016 SLP Individual Time: 1120-1140 SLP Individual Time Calculation (min): 20 min and  Today's Date: 10/15/2016 SLP Missed Time: 25 Minutes Missed Time Reason: Patient fatigue;Patient ill (Comment);Other (Comment) (earlier today high BP with N/V )  Short Term Goals: Week 1: SLP Short Term Goal 1 (Week 1): Patient will safely sequence transfers with recall for hand placement with walker during sit to stand and stand to sit with Supervision assist verbal cues SLP Short Term Goal 2 (Week 1): Patient will demonstrate ability to recall current medications, functions, and frequencies with Min assist verbal cues to utilize external aid SLP Short Term Goal 3 (Week 1): Patient will recall recommended diet restrictions with Supervision assist to multimodal cues SLP Short Term Goal 4 (Week 1): Patient will utilize working memory strategies for accurate completion of multi-step directions with Min assist verbal cues   Skilled Therapeutic Interventions: Skilled treatment session focused on addressing cognition goals. SLP aware of earlier high BP event with N/V.  Upon SLP arrival patient asleep in chair and easily aroused with verbal stimuli.  SLP facilitated session with discussion regarding recall of recommended diet modifications to reduce sodium intake; patient Mod I for recall of 2/4 and with Min cues patient then able to recall 4/4.  Following a 5 minute delay patient recalled 3/4 Mod I and again with Min question cues accuracy increased to 4/4.  Patient recalled appropriate hand placement for sit to stand with walker and required Min verbal cues to recall for stand to sit.  Patient reported fatigue and requested to go back to bed.  Patient with Min physical assist and Supervision cues to utilize correct hand placement during transfer.  Session ended early due  to fatigue after earlier events.  Continue with current plan of care.    Function:  Cognition Comprehension Comprehension assist level: Follows basic conversation/direction with extra time/assistive device  Expression   Expression assist level: Expresses basic needs/ideas: With extra time/assistive device  Social Interaction Social Interaction assist level: Interacts appropriately 75 - 89% of the time - Needs redirection for appropriate language or to initiate interaction.  Problem Solving Problem solving assist level: Solves basic 50 - 74% of the time/requires cueing 25 - 49% of the time  Memory Memory assist level: Recognizes or recalls 50 - 74% of the time/requires cueing 25 - 49% of the time    Pain Pain Assessment Pain Assessment: No/denies pain  Therapy/Group: Individual Therapy  Charlane FerrettiMelissa Mykah Shin, M.A., CCC-SLP [phone]  Erika Boyer 10/15/2016, 11:44 AM

## 2016-10-15 NOTE — Progress Notes (Addendum)
Pt reported nausea on top of elevated BP reported during therapy session. Pam Love notified, compazine given for nausea p vomited ~50 cc undigested pudding/eggs. Reported nausea subsided p vomiting. Up to sink to brush teeth and use Bathroom. Cool cloth to head.  [facility] REviewed order for CXR with pt and patient. Pamelia HoitSharp, Deetta Siegmann B

## 2016-10-15 NOTE — Progress Notes (Signed)
Pt arrived on unit via wheelchair by RN at 1717. Hand held assisted pt to chair in room. Pt was introduced to staff. Educated pt on the admission process. Pt was able to answer the majority of admission questions. Telephoned mother while in the room with pt. Mother stated she tries to encourage and motive her daughter to do most of things own her and support her. Mother notes daughter does becomes anxious at time to refrain doing things she does not want to do, mother states we would have to encourage Meria to participate in her activities. BLE edema. No skin issues. Denies any swallowing issues. Family is her from [place], have not been in the area long. Mother notes, since the move she had not been as active. Mother mentions there 14-15 flight of stairs. Does not want to use a password result to family members stay out of town and they will be calling.

## 2016-10-15 NOTE — Progress Notes (Signed)
Social Work  Social Work Assessment and Plan  Patient Details  Name: Erika Boyer MRN: [id number] Date of Birth: Nov 14, 1995  Today's Date: 10/15/2016  Problem List:  Patient Active Problem List   Diagnosis Date Noted  . Debility 10/12/2016  . Leg swelling   . Shortness of breath   . Cerebral atrophy   . MR (mental retardation)   . Drug-induced lupus erythematosus   . Tachycardia   . Hypokalemia   . Acute blood loss anemia   . S/P thoracentesis   . Sepsis (HCC) 10/03/2016  . Acute respiratory failure with hypoxia (HCC) 10/03/2016  . CAP (community acquired pneumonia) 10/03/2016  . Hyperthyroidism 10/03/2016  . Morbid obesity with BMI of 40.0-44.9, adult (HCC) 10/03/2016  . Acute kidney injury (HCC) 10/03/2016  . 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 Microcytic anemia 10/03/2016  . Cardiomegaly 10/03/2016  . Acute respiratory failure (HCC) 10/03/2016  . Lobar pneumonia (HCC)   . Pleural effusion   . Pleural effusion, left   . Graves disease 08/23/2016   Past Medical History:  Past Medical History:  Diagnosis Date  . Anemia   . Anxiety   . Arthritis    "right knee" (10/11/2016)  . Cerebellar degeneration   . Chronic bronchitis (HCC)   . Daily headache   . Depression   . Graves disease 2012   hx; "was on RX; it disappeared" (10/11/2016)  . History of blood transfusion    "related to the anemia" (10/11/2016)  . Hypertension   . Lupus    "not sure what kind" (10/11/2016)  . Pneumonia 10/03/2016   Past Surgical History:  Past Surgical History:  Procedure Laterality Date  . PERICARDIOCENTESIS N/A 10/04/2016   Procedure: Pericardiocentesis;  Surgeon: Yates Decamp, MD;  Location: [facility] INVASIVE CV LAB;  Service: Cardiovascular;  Laterality: N/A;  . REDUCTION MAMMAPLASTY Bilateral 2012  . TONSILLECTOMY     Social History:  reports that she has never smoked. She has never used smokeless tobacco. She reports that she does not drink alcohol or use drugs.  Family / Support  Systems Marital Status: Single Patient Roles: Other (Comment) (daughter) Other Supports: mother, Pearlina Friedly @ [phone] Anticipated Caregiver: Mom Ability/Limitations of Caregiver: Mom works part time 3 days per week Caregiver Availability: Intermittent Family Dynamics: Pt lives with mother and good relationship overall.  Pt's father and siblings live mostly in [place] but only limited contact.  Social History Preferred language: English Religion: Baptist Cultural Background: NA Education: HS grad Read: Yes Write: Yes Employment Status: Disabled Date Retired/Disabled/Unemployed: since age 53 due to viral illness causing cerebral atrophy Legal Hisotry/Current Legal Issues: None Guardian/Conservator: None - per MD, pt is not fully capable of making decisions on her own behalf - defer to mother.   Abuse/Neglect Physical Abuse: Denies Verbal Abuse: Denies Sexual Abuse: Denies Exploitation of patient/patient's resources: Denies Self-Neglect: Denies  Emotional Status Pt's affect, behavior adn adjustment status: Pt sitting up in w/c, pleasant and able to complete assessment interview with basic questions.  She reports she feels "so tired...for no reason..." but denies any current emotional distress. Feel pt would benefit from neuropsychology input. Recent Psychosocial Issues: pt and mother just moved to [place] from [place] ~ 3 months ago "for better weather..." Pyschiatric History: Pt reports that she was seen as a child "for depression" but no services recently.  No medications. Substance Abuse History: None  Patient / Family Perceptions, Expectations & Goals Pt/Family understanding of illness & functional limitations: Pt states the reason for her  admit was "because I wasn't eating...".  Does not appear to have much understanding of her multiple medical issues and identifies symptoms as the problems.  Mother with good, basic understanding but with questions about  diet, meds, etc and will refer for nursing/ MD to follow up. Premorbid pt/family roles/activities: Pt admits that she was not very physically active PTA.  Watching a lot of tv.  Making meal for herself "when I felt like eating..." Anticipated changes in roles/activities/participation: Per tx goals of mod independent, little change anticipated.  Mother was assisting as needed PTA. Pt/family expectations/goals: "I just want to feel better."  [employer] Community Agencies: None Premorbid Home Care/DME Agencies: None Transportation available at discharge: yes Resource referrals recommended: Neuropsychology  Discharge Planning Living Arrangements: Parent Support Systems: Parent Type of Residence: Private residence Insurance Resources: [employer]icaid (specify county), [employer]icare Financial Resources: SSD, [employer] Financial Screen Referred: No Living Expenses: Psychologist, sport and exerciseent Money Management: Family Does the patient have any problems obtaining your medications?: No Home Management: Pt and mother share upkeep of home Patient/Family Preliminary Plans: Pt to return home with her mother as primary support. Social Work Anticipated Follow Up Needs: HH/OP Expected length of stay: 12-15 days  Clinical Impression Pleasant, soft-spoken young woman here for deconditioning and with multiple medical issues.  Some cognitive limitations that are baseline from illness as a child.  Denies any significant emotional distress.  Lives with her mother who is her primary support but does work very part-time.  Will follow for support and d/c planning needs.  Pocahontas Cohenour 10/15/2016, 4:13 PM

## 2016-10-15 NOTE — Progress Notes (Signed)
Occupational Therapy Session Note  Patient Details  Name: Erika FlatteryOmaure Klingerman MRN: [id number] Date of Birth: June 06, 1995  Today's Date: 10/15/2016 OT Individual Time: [id number] OT Individual Time Calculation (min): 29 min  and Today's Date: 10/15/2016 OT Missed Time: 31 Minutes Missed Time Reason: Patient ill (comment) (vomiting; high BP with RN/PA recommending therapy hold)   Short Term Goals: Week 1:  OT Short Term Goal 1 (Week 1): Pt will complete 3 grooming tasks in standing in order to increase functional activity tolerance OT Short Term Goal 2 (Week 1): Pt will dress LB with set-up/ dupervision using AE PRN OT Short Term Goal 3 (Week 1): Pt will complete 3/3 toileting tasks with supervision OT Short Term Goal 4 (Week 1): Pt will tolerate full bathing/dressing session with no more than 2 rest breaks  Skilled Therapeutic Interventions/Progress Updates:    Pt sitting in recliner with emesis basin in lap at time of arrival. Pt verbalizing strong desire to shower but looked unwell and was hooked up to IV. Consulted RN Gavin Pound(Deborah). She reported pt could shower with IV running as long as entry site on skin was covered (and pt was willing). Gathered items while RN assessed BP. Read of 160/116. Pt vomited into basin and reported nausea had subsided. Pt expressing desire to brush her teeth and void. With RN consent, OT assisted with these self care tasks. Pt ambulating to bathroom with Min A and RW, tended to sway, with 1 LOB and OT correction. Pt completed toileting, then oral care at sink with Min A and cues for safe walker placement. Pt was then transferred to recliner, repositioned for comfort and left with all needs within reach.   RN contacted PA Pam during tx. She advised to hold further AM therapy until 2nd BP read was taken 30 minutes later. 31 minutes missed.   Therapy Documentation Precautions:  Precautions Precautions: Fall Restrictions Weight Bearing Restrictions:  No General: General OT Amount of Missed Time: 31 Minutes Vital Signs: Therapy Vitals Pulse Rate: 88 Resp: 20 BP: (!) 160/116 Patient Position (if appropriate): Sitting Pain: Pain Assessment Pain Assessment: 0-10 Pain Score: 6  Pain Location: Scapula Pain Orientation: Right;Left Pain Descriptors / Indicators: Aching Patients Stated Pain Goal: 4 Pain Intervention(s): Medication (See eMAR) ADL:      See Function Navigator for Current Functional Status.   Therapy/Group: Individual Therapy  Hubbard Seldon A Naria Abbey 10/15/2016, 10:51 AM

## 2016-10-15 NOTE — Care Management Note (Signed)
Inpatient Rehabilitation Center Individual Statement of Services  Patient Name:  Erika Boyer  Date:  10/15/2016  Welcome to the Inpatient Rehabilitation Center.  Our goal is to provide you with an individualized program based on your diagnosis and situation, designed to meet your specific needs.  With this comprehensive rehabilitation program, you will be expected to participate in at least 3 hours of rehabilitation therapies Monday-Friday, with modified therapy programming on the weekends.  Your rehabilitation program will include the following services:  Physical Therapy (PT), Occupational Therapy (OT), Speech Therapy (ST), 24 hour per day rehabilitation nursing, Therapeutic Recreaction (TR), Neuropsychology, Case Management (Social Worker), Rehabilitation Medicine, Nutrition Services and Pharmacy Services  Weekly team conferences will be held on Tuesdays to discuss your progress.  Your Social Worker will talk with you frequently to get your input and to update you on team discussions.  Team conferences with you and your family in attendance may also be held.  Expected length of stay: 12-15 days  Overall anticipated outcome: modified independent  Depending on your progress and recovery, your program may change. Your Social Worker will coordinate services and will keep you informed of any changes. Your Social Worker's name and contact numbers are listed  below.  The following services may also be recommended but are not provided by the Inpatient Rehabilitation Center:   Driving Evaluations  Home Health Rehabiltiation Services  Outpatient Rehabilitation Services    Arrangements will be made to provide these services after discharge if needed.  Arrangements include referral to agencies that provide these services.  Your insurance has been verified to be:  Medicare and Medicaid Your primary doctor is:  Sindy MessingRoger Gomez, [place]  Pertinent information will be shared with your doctor and your  insurance company.  Social Worker:  TehuacanaLucy Jamill [place], [place] [phone] or [phone]   Information discussed with and copy given to patient by: Amada JupiterHOYLE, Odile Veloso, 10/15/2016, 4:15 PM

## 2016-10-15 NOTE — Progress Notes (Signed)
Physical Therapy Session Note  Patient Details  Name: Erika Boyer MRN: [id number] Date of Birth: [birth year]  Today's Date: 10/15/2016 PT Individual Time: [id number] and [id number] PT Individual Time Calculation (min): 25 min and 30 min  Short Term Goals: Week 1:  PT Short Term Goal 1 (Week 1): Pt will negotiate 12 steps with 1 rail and min assist. PT Short Term Goal 2 (Week 1): Pt will ambulate 75 ft with LRAD & supervision.  Skilled Therapeutic Interventions/Progress Updates:  Treatment 1: Pt received in room & agreeable to tx. Pt noting 6/10 back pain & unable to recall receiving pain meds even though nurse reports giving her meds 1 hr prior to session. Session focused on functional mobility (transfers, gait). Pt completed ambulatory transfer bed>w/c with RW & min assist for overall balance. In gym pt ambulated 200 ft with RW & min assist demonstrating BLE weakness, hyperextension R knee, and decreased overall balance. After gait pt's vitals: BP = 150/109 mmHg (LUE sitting, automatic), HR = 88bpm. Pt took 5 minute seated rest break & vitals: BP = 141/102 mmHg (LUE sitting, automatic), HR = 87 bpm. Pt reports feeling "good" and no other adverse symptoms; RN & MD made aware of pt's elevated BP. Pt rested and consumed water. Rechecked pt's BP manually:  BP = 160/120 mmHg (manually checked by PT, LUE, sitting, resting for ~30 minutes) BP = 162/110 mmHg (manually checked by NT, LUE, sitting, resting for ~30 minutes) RN made aware of manual BP's.  Pt then assisted back to room & performed stand pivot w/c>recliner without AD and min assist. Pt left in recliner with all needs within reach & visitor present.  Throughout session pt demonstrates decreased safety awareness & impulsiveness with all movements; educated pt on need to slow down for increased safety.     Treatment 2: RN cleared pt for participation in therapy. Pt denied c/o pain at rest, noting only fatigue. Pt transferred supine>sitting  EOB with mod I & bed rails. BP = 157/106 mmHg, HR = 80 bpm (automatic, LUE). RN made aware of pt's BP & cleared her for continuation in therapy. Educated pt on use of acapella flutter valve with pt return demonstrating; discussed importance of daily use. Pt transferred bed>w/c via stand pivot with min assist with poor eccentric control for stand>sit. Transported pt to gym via w/c total assist & pt performed BLE long arc quads (1# on each LE) and hip adduction squeezes for strengthening, with cuing for technique. Pt's BP = 135/101 mmHg, HR = 73 bpm. RN administered BP medication. Transported pt back to room via w/c total assist and pt assisted w/c>recliner via stand pivot with min assist with cuing for increased eccentric control with stand>sit. Pt left sitting in recliner with chair alarm set, call bell within reach, & RN present.     Therapy Documentation Precautions:  Precautions Precautions: Fall Restrictions Weight Bearing Restrictions: No  General: PT Amount of Missed Time (min): 35 Minutes PT Missed Treatment Reason:  (medical)   See Function Navigator for Current Functional Status.   Therapy/Group: Individual Therapy  Sandi MariscalVictoria M Emerita Berkemeier 10/15/2016, 1:40 PM

## 2016-10-16 ENCOUNTER — Inpatient Hospital Stay (HOSPITAL_COMMUNITY): Payer: Medicare Other

## 2016-10-16 ENCOUNTER — Inpatient Hospital Stay: Payer: Medicare Other | Admitting: Pulmonary Disease

## 2016-10-16 ENCOUNTER — Inpatient Hospital Stay (HOSPITAL_COMMUNITY): Payer: Medicare Other | Admitting: Speech Pathology

## 2016-10-16 ENCOUNTER — Inpatient Hospital Stay (HOSPITAL_COMMUNITY): Payer: Medicare Other | Admitting: Physical Therapy

## 2016-10-16 ENCOUNTER — Inpatient Hospital Stay (HOSPITAL_COMMUNITY): Payer: Medicare Other | Admitting: Occupational Therapy

## 2016-10-16 DIAGNOSIS — T50905A Adverse effect of unspecified drugs, medicaments and biological substances, initial encounter: Secondary | ICD-10-CM

## 2016-10-16 DIAGNOSIS — L932 Other local lupus erythematosus: Secondary | ICD-10-CM

## 2016-10-16 LAB — CBC
HCT: 27.2 % — ABNORMAL LOW (ref 36.0–46.0)
Hemoglobin: 8.6 g/dL — ABNORMAL LOW (ref 12.0–15.0)
MCH: 23.8 pg — AB (ref 26.0–34.0)
MCHC: 31.6 g/dL (ref 30.0–36.0)
MCV: 75.3 fL — ABNORMAL LOW (ref 78.0–100.0)
PLATELETS: 316 10*3/uL (ref 150–400)
RBC: 3.61 MIL/uL — AB (ref 3.87–5.11)
RDW: 20.3 % — ABNORMAL HIGH (ref 11.5–15.5)
WBC: 10 10*3/uL (ref 4.0–10.5)

## 2016-10-16 LAB — BASIC METABOLIC PANEL
Anion gap: 12 (ref 5–15)
BUN: 15 mg/dL (ref 6–20)
CALCIUM: 8.5 mg/dL — AB (ref 8.9–10.3)
CO2: 27 mmol/L (ref 22–32)
CREATININE: 2 mg/dL — AB (ref 0.44–1.00)
Chloride: 103 mmol/L (ref 101–111)
GFR calc Af Amer: 40 mL/min — ABNORMAL LOW (ref >60)
GFR calc non Af Amer: 35 mL/min — ABNORMAL LOW (ref >60)
Glucose, Bld: 117 mg/dL — ABNORMAL HIGH (ref 65–99)
Potassium: 3.5 mmol/L (ref 3.5–5.1)
SODIUM: 142 mmol/L (ref 135–145)

## 2016-10-16 LAB — HEMOGLOBIN A1C
HEMOGLOBIN A1C: 5.3 % (ref 4.8–5.6)
MEAN PLASMA GLUCOSE: 105 mg/dL

## 2016-10-16 MED ORDER — METOPROLOL TARTRATE 50 MG PO TABS
50.0000 mg | ORAL_TABLET | Freq: Two times a day (BID) | ORAL | Status: DC
  Administered 2016-10-16 – 2016-10-18 (×4): 50 mg via ORAL
  Filled 2016-10-16 (×4): qty 1

## 2016-10-16 MED ORDER — DILTIAZEM HCL 30 MG PO TABS
30.0000 mg | ORAL_TABLET | Freq: Three times a day (TID) | ORAL | Status: DC
  Administered 2016-10-16 – 2016-10-25 (×27): 30 mg via ORAL
  Filled 2016-10-16 (×29): qty 1

## 2016-10-16 MED ORDER — FUROSEMIDE 40 MG PO TABS: 40.0000 mg | ORAL_TABLET | Freq: Every day | ORAL | Status: DC

## 2016-10-16 NOTE — Evaluation (Signed)
Recreational Therapy Assessment and Plan  Patient Details  Name: Erika Boyer MRN: [id number] Date of Birth: July 05, 1995 Today's Date: 10/16/2016  Rehab Potential: Good ELOS: 10 days   Assessment Problem List:      Patient Active Problem List   Diagnosis Date Noted  . Debility 10/12/2016  . Leg swelling   . Shortness of breath   . Cerebral atrophy   . MR (mental retardation)   . Drug-induced lupus erythematosus   . Tachycardia   . Hypokalemia   . Acute blood loss anemia   . S/P thoracentesis   . Sepsis ([place]) 10/03/2016  . Acute respiratory failure with hypoxia ([place]) 10/03/2016  . CAP (community acquired pneumonia) 10/03/2016  . Hyperthyroidism 10/03/2016  . Morbid obesity with BMI of 40.0-44.9, adult ([place]) 10/03/2016  . Acute kidney injury ([place]) 10/03/2016  . 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 Microcytic anemia 10/03/2016  . Cardiomegaly 10/03/2016  . Acute respiratory failure ([place]) 10/03/2016  . Lobar pneumonia ([place])   . Pleural effusion   . Pleural effusion, left   . Graves disease 08/23/2016    Past Medical History:      Past Medical History:  Diagnosis Date  . Anemia   . Anxiety   . Arthritis    "right knee" (10/11/2016)  . Cerebellar degeneration   . Chronic bronchitis ([place])   . Daily headache   . Depression   . Graves disease 2012   hx; "was on RX; it disappeared" (10/11/2016)  . History of blood transfusion    "related to the anemia" (10/11/2016)  . Hypertension   . Lupus    "not sure what kind" (10/11/2016)  . Pneumonia 10/03/2016   Past Surgical History:       Past Surgical History:  Procedure Laterality Date  . PERICARDIOCENTESIS N/A 10/04/2016   Procedure: Pericardiocentesis;  Surgeon: Adrian Prows, MD;  Location: [facility];  Service: Cardiovascular;  Laterality: N/A;  . REDUCTION MAMMAPLASTY Bilateral 2012  . TONSILLECTOMY      Assessment & Plan Clinical Impression: Patient is a 21  y.o.femalewith history of, cerebral atrophy due to viral illness at age 21, MR with bouts of agitation, morbid obesity, Graves disease; who was admitted on 10/03/16 with 3 day history of progressive SOB, BLE edema with erythema X 2 weeks and acute renal failure. CT chest reviewed, showing pleural and pericardial effusion. She was found to have large left pleural effusion due to CAP or viral illness as well as large pericardial effusion. she was intubated in ED and underwent left thoracocentesis for evacuation of 850 cc of cloudy exudative pleural effusion. She was placed on IV antibiotics as well as steroids due to concerns of drug induced SLE.. 2D echo with EF 60-65% with severe circumferential effusion with early tamponade and on 5/17 and she underwent pericardiocentesis of 870 cc of serosanguineous fluid. She was placed on colchicine empirically by Dr. Einar Gip due to concerns of acute peritonitis v/s viral pericarditis with recommendations to repeat echo 1-2 days prior to discharge. 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 [facility] hospitalist has discussed case with Dr. Gavin Pound who recommended continuing prednisone 40 mg daily till follow up in office (they will contact patient with appt). Repeat 2 D echo on 5/23 showed EF 55-60% with small to moderate partially loculated pericardial effusion with fibrinous strands c/w hemopericardium, organized thrombus and no chamber collapse or tamponade. AKI has improved with  hydration. Anemia of chronic disease with drop in H/H to 6.8/21.8 treated with on unit PRBC. Her respiratory status and endurance is showing improvement.  Patient transferred to CIR on 10/12/2016.  Pt presents with decreased activity tolerance, decreased functional mobility, decreased balance, decreased coordination Limiting pt's independence with leisure/community pursuits.   Leisure  History/Participation Premorbid leisure interest/current participation: Sports - Exercise (Comment) (plans to [facility]ing the [facility] when she's discharged and able to go) Other Leisure Interests: Television;Movies (likes comedy & horror) Leisure Participation Style: Alone;With Family/Friends Psychosocial / Spiritual Patient agreeable to Pet Therapy: Yes Social interaction - Mood/Behavior: Cooperative Recreational Therapy Orientation Orientation -Reviewed with patient: Available activity resources Strengths/Weaknesses Patient Strengths/Abilities: Willingness to participate Patient weaknesses: Physical limitations;Minimal Premorbid Leisure Activity TR Patient demonstrates impairments in the following area(s): Endurance;Motor;Safety  Plan Rec Therapy Plan Is patient appropriate for Therapeutic Recreation?: Yes Rehab Potential: Good Treatment times per week: Min 1 TR session/group >20 minutes during LOS Estimated Length of Stay: 10 days TR Treatment/Interventions: Adaptive equipment instruction;1:1 session;Balance/vestibular training;Functional mobility training;Community reintegration;Group participation (Comment);Patient/family education;Therapeutic activities;Recreation/leisure participation;Therapeutic exercise;UE/LE Coordination activities;Wheelchair propulsion/positioning Recommendations for other services: Neuropsych  Recommendations for other services: Neuropsych  Discharge Criteria: Patient will be discharged from TR if patient refuses treatment 3 consecutive times without medical reason.  If treatment goals not met, if there is a change in medical status, if patient makes no progress towards goals or if patient is discharged from hospital.  The above assessment, treatment plan, treatment alternatives and goals were discussed and mutually agreed upon: by patient  [place] 10/16/2016, 3:51 PM

## 2016-10-16 NOTE — Progress Notes (Signed)
Social Work Patient ID: Erika Boyer, female   DOB: 12-May-1996, 22 y.o.   MRN: [id number]  Met with pt and Mom to discuss team conference goals supervision level due to safety risk and impulsive in her movements. Mom feels she has always been like this and is no different. Mom had many concerns regarding medical issues and fluid. Have asked Pam-PA to go in and answer mom's questions. Mom also had therapy questions and  Invited her to come in and attend therapies with pt. Pt does not want her mom to see her in therapies then she will nag her to do the exercises at home. Seems to be a family dynamic issue with what Mom wants and what pt Wants. Pt stated: " I don't need your negative energy just leave."  Have also asked for Angie-Rn to go in and address RN concerns regarding going to the bathroom at night and not using a bedside commode. Both aware target discharge date is 6/7. Will work toward this goal.

## 2016-10-16 NOTE — Progress Notes (Signed)
Speech Language Pathology Daily Session Note  Patient Details  Name: [name] MRN: [id number] Date of Birth: [birth year]  Today's Date: 10/16/2016 SLP Individual Time: 0750-0830 SLP Individual Time Calculation (min): 40 min  Missed Time: 20 minutes, X-ray  Short Term Goals: Week 1: SLP Short Term Goal 1 (Week 1): Patient will safely sequence transfers with recall for hand placement with walker during sit to stand and stand to sit with Supervision assist verbal cues SLP Short Term Goal 2 (Week 1): Patient will demonstrate ability to recall current medications, functions, and frequencies with Min assist verbal cues to utilize external aid SLP Short Term Goal 3 (Week 1): Patient will recall recommended diet restrictions with Supervision assist to multimodal cues SLP Short Term Goal 4 (Week 1): Patient will utilize working memory strategies for accurate completion of multi-step directions with Min assist verbal cues   Skilled Therapeutic Interventions: Skilled treatment session focused on cognitive goals. SLP facilitated session by providing education and a visual aid in regards to her current medications and their functions to facilitate carryover. Patient was then able to recall the functions of her medications to the RN with Min A verbal cues after a 5 minute delay with 75% accuracy.  Patient left supine in bed with all needs within reach and alarm on. Continue with current plan of care.      Function:  Eating Eating     Eating Assist Level: More than reasonable amount of time   Eating Set Up Assist For: Opening containers       Cognition Comprehension Comprehension assist level: Follows basic conversation/direction with extra time/assistive device  Expression   Expression assist level: Expresses basic needs/ideas: With extra time/assistive device  Social Interaction Social Interaction assist level: Interacts appropriately 75 - 89% of the time - Needs redirection for appropriate  language or to initiate interaction.  Problem Solving Problem solving assist level: Solves basic 50 - 74% of the time/requires cueing 25 - 49% of the time  Memory Memory assist level: Recognizes or recalls 50 - 74% of the time/requires cueing 25 - 49% of the time    Pain No/Denies Pain   Therapy/Group: Individual Therapy  Chez Bulnes 10/16/2016, 12:45 PM

## 2016-10-16 NOTE — Progress Notes (Signed)
PHYSICAL MEDICINE & REHABILITATION     PROGRESS NOTE    Subjective/Complaints: Overall feeling better. Has a few wheezes here and there but otherwise ok. Complains of occasional belly pain. No fevers  ROS: pt denies nausea, vomiting, diarrhea, cough, shortness of breath or chest pain    Objective: Vital Signs: Blood pressure 130/80, pulse 92, temperature 98.4 F (36.9 C), temperature source Oral, resp. rate 18, height 5\' 5"  (1.651 m), weight 109.9 kg (242 lb 3.2 oz), last menstrual period 10/08/2016, SpO2 97 %. Dg Chest 2 View  Result Date: 10/16/2016 CLINICAL DATA:  Shortness of breath. EXAM: CHEST  2 VIEW COMPARISON:  10/13/2016 FINDINGS: Two views study shows decrease in the bilateral pleural effusions, left greater than right with persistent bibasilar atelectasis. Cardiopericardial silhouette appears enlarged but stable. The visualized bony structures of the thorax are intact. IMPRESSION: Decreasing bilateral pleural effusions with bilateral basilar atelectasis. Electronically Signed   By: Kennith Center M.D.   On: 10/16/2016 07:49   No results for input(s): WBC, HGB, HCT, PLT in the last 72 hours.  Recent Labs  10/15/16 0602  NA 140  K 3.3*  CL 102  GLUCOSE 83  BUN 11  CREATININE 1.16*  CALCIUM 8.2*   CBG (last 3)  No results for input(s): GLUCAP in the last 72 hours.  Wt Readings from Last 3 Encounters:  10/16/16 109.9 kg (242 lb 3.2 oz)  10/12/16 122.5 kg (270 lb)  08/23/16 118.5 kg (261 lb 3.2 oz)    Physical Exam:  Constitutional: She is oriented to person, place, and time. She appears well-developedand well-nourished. No distress.  HENT:  Head: Normocephalicand atraumatic.  Mouth/Throat: Oropharynx is clear and moist.  Eyes: Conjunctivaeare normal. Pupils are equal, round, and reactive to light.  Neck: Normal range of motion. Neck supple.  Cardiovascular:  RRR  Respiratory: no wheezes, reasonable air movement.  GI: Soft. Bowel sounds are  normal. She exhibits no distension. There is no rebound.  Musculoskeletal: She exhibits edema. She exhibits no tenderness.  1+ to 2+ edema BLE Neurological: She is alertand oriented to person, place, and time.  Skin: Skin is warmand dry.  Marland Kitchen  Psychiatric: a little flat but cooperative.   Assessment/Plan: 1. Functional and mobility deficits secondary to sepsis/debility which require 3+ hours per day of interdisciplinary therapy in a comprehensive inpatient rehab setting. Physiatrist is providing close team supervision and 24 hour management of active medical problems listed below. Physiatrist and rehab team continue to assess barriers to discharge/monitor patient progress toward functional and medical goals.  Function:  Bathing Bathing position   Position: Wheelchair/chair at sink  Bathing parts Body parts bathed by patient: Right arm, Left arm, Chest, Abdomen, Front perineal area, Buttocks, Right upper leg, Left upper leg Body parts bathed by helper: Right lower leg, Left lower leg, Back  Bathing assist Assist Level: Touching or steadying assistance(Pt > 75%)      Upper Body Dressing/Undressing Upper body dressing   What is the patient wearing?: Pull over shirt/dress     Pull over shirt/dress - Perfomed by patient: Thread/unthread left sleeve Pull over shirt/dress - Perfomed by helper: Thread/unthread right sleeve, Put head through opening, Pull shirt over trunk        Upper body assist        Lower Body Dressing/Undressing Lower body dressing   What is the patient wearing?: Underwear, Pants, Non-skid slipper socks Underwear - Performed by patient: Pull underwear up/down Underwear - Performed by helper: Thread/unthread right underwear  leg, Thread/unthread left underwear leg Pants- Performed by patient: Pull pants up/down Pants- Performed by helper: Thread/unthread right pants leg, Thread/unthread left pants leg   Non-skid slipper socks- Performed by helper: Don/doff right  sock, Don/doff left sock                  Lower body assist Assist for lower body dressing: Touching or steadying assistance (Pt > 75%)      Toileting Toileting   Toileting steps completed by patient: Adjust clothing prior to toileting, Adjust clothing after toileting, Performs perineal hygiene   Toileting Assistive Devices: Grab bar or rail  Toileting assist Assist level: Touching or steadying assistance (Pt.75%)   Transfers Chair/bed transfer   Chair/bed transfer method: Stand pivot Chair/bed transfer assist level: Touching or steadying assistance (Pt > 75%) Chair/bed transfer assistive device: Armrests     Locomotion Ambulation     Max distance: 200 ft Assist level: Touching or steadying assistance (Pt > 75%)   Wheelchair   Type: Manual Max wheelchair distance: 650ft  Assist Level: Touching or steadying assistance (Pt > 75%)  Cognition Comprehension Comprehension assist level: Follows basic conversation/direction with extra time/assistive device  Expression Expression assist level: Expresses basic needs/ideas: With extra time/assistive device  Social Interaction Social Interaction assist level: Interacts appropriately 75 - 89% of the time - Needs redirection for appropriate language or to initiate interaction.  Problem Solving Problem solving assist level: Solves basic 50 - 74% of the time/requires cueing 25 - 49% of the time  Memory Memory assist level: Recognizes or recalls 50 - 74% of the time/requires cueing 25 - 49% of the time   Medical Problem List and Plan: 1. Debility secondary to Sepsis, lupuswith multiple medical issues.  -continue therapies  -team conference today 2. DVT Prophylaxis/Anticoagulation: Pharmaceutical: Lovenox 3. Pain Management: tylenol prn as well as increase in mobility should help with back pain.  4. Mood: LCSW to follow for evaluation and support.  5. Neuropsych: This patient is not fully capable of making decisions on  hisown behalf. 6. Skin/Wound Care: routine pressure relief measures.   protein supplement for low protein stores and to help with edema.  7. Fluids/Electrolytes/Nutrition:  Replace potassium  -encourage PO 8. CAP with SOB: nebulizers, IS/FV  -5/25 with ?LLL > RLL infiltrate/effusions. Today's cxr with decreased effusions, ?atelectasis  -vanc/zosyn started---has been afebrile.   -narrowed to zosyn yesterday---dc after today's doses  -lasix iniitated as well over weekend--continue 9. Pleural effusion:   repeat CXR this morning with improvement---continue to monitor. 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  prednisone 40 mg daily- . To follow up with Dr. Nickola MajorHawkes after dischage 11. Peripheral edema: Monitor weights daily.  12. Iron deficiency anemia: Improved with transfusion. Add iron supplement. :  13. Tachycardia: Monitor BP/HR bid --continue metoprolol bid.  14. Hyperthyroid: off methamazole.    LOS (Days) 4 A FACE TO FACE EVALUATION WAS PERFORMED  Ranelle OysterSWARTZ,Kolin Erdahl T, MD 10/16/2016 8:46 AM

## 2016-10-16 NOTE — Plan of Care (Signed)
Problem: RH Balance Goal: LTG Patient will maintain dynamic standing with ADLs (OT) LTG:  Patient will maintain dynamic standing balance with assist during activities of daily living (OT)   Downgraded secondary to safety   Problem: RH Dressing Goal: LTG Patient will perform lower body dressing w/assist (OT) LTG: Patient will perform lower body dressing with assist, with/without cues in positioning using equipment (OT)  Downgraded secondary to safety   Problem: RH Toileting Goal: LTG Patient will perform toileting w/assist, cues/equip (OT) LTG: Patient will perform toiletiing (clothes management/hygiene) with assist, with/without cues using equipment (OT)  Downgraded secondary to safety

## 2016-10-16 NOTE — Plan of Care (Signed)
Problem: RH Balance Goal: LTG Patient will maintain dynamic standing balance (PT) LTG:  Patient will maintain dynamic standing balance with assistance during mobility activities (PT)  Downgrade 2/2 impaired cognition  Problem: RH Bed to Chair Transfers Goal: LTG Patient will perform bed/chair transfers w/assist (PT) LTG: Patient will perform bed/chair transfers with assistance, with/without cues (PT).  Downgrade 2/2 slow progress  Problem: RH Furniture Transfers Goal: LTG Patient will perform furniture transfers w/assist (OT/PT LTG: Patient will perform furniture transfers  with assistance (OT/PT).  Downgrade 2/2 slow progress  Problem: RH Ambulation Goal: LTG Patient will ambulate in controlled environment (PT) LTG: Patient will ambulate in a controlled environment, # of feet with assistance (PT).  100 ft with LRAD; downgrade 2/2 slow progress Goal: LTG Patient will ambulate in home environment (PT) LTG: Patient will ambulate in home environment, # of feet with assistance (PT).  50 ft with LRAD; downgrade 2/2 slow progress

## 2016-10-16 NOTE — Progress Notes (Addendum)
Physical Therapy Session Note  Patient Details  Name: Erika Boyer MRN: [id number] Date of Birth: 12-Aug-1995  Today's Date: 10/16/2016 PT Individual Time: [id number] and 1400-1415 PT Individual Time Calculation (min): 56 min and 15 min  Short Term Goals: Week 1:  PT Short Term Goal 1 (Week 1): Pt will negotiate 12 steps with 1 rail and min assist. PT Short Term Goal 2 (Week 1): Pt will ambulate 75 ft with LRAD & supervision.  Skilled Therapeutic Interventions/Progress Updates:  Treatment 1: Pt received sitting on EOB & BP immediately checked, 142/107 mmHg, HR = 83 bpm & RN made aware. Pt with c/o back pain (4/10) that increased with some activity & rest breaks provided PRN. RN administered BP medication at beginning of session & cleared pt for participation in treatment. Pt reported need to use bathroom & ambulated within room & bathroom with RW & min assist, requiring max assist on one occasion to prevent LOB. Pt with very poor awareness of impaired balance & safety awareness; therapist continued to educate pt on safety. Pt with continent void on toilet and required min assist for balance while performing hand hygiene standing at sink. Transported pt room<>gym via w/c total assist for time management & energy conservation. In gym pt utilized cybex kinetron in sitting up to 40 cm/sec x 1 minute bouts for BLE strengthening. Pt then transferred onto mat table and into tall kneeling position. Pt tolerated tall kneeling position for 3 minutes while engaging in puzzle assembly with task focusing on balance and BLE strengthening; pt requested to quit activity 2/2 increasing back pain. Pt then engaged in standing balance activity, reaching outside of BOS to obtain and match cards. Pt required min/mod assist for balance 2/2 unsteadiness and LOB. Throughout session therapist educated pt on sequencing for sit<>stand and stand pivot transfers with RW, with focus on safety, hand placement, and eccentric control.  At end of session pt transferred to sitting EOB & BP = 157/107 mmHg, HR = 82 bpm & RN made aware. Pt transferred sit>supine & left in bed with alarm set & all needs within reach.   Treatment 2: Pt received in bed & agreeable to tx, denying c/o pain at rest. Pt transferred to sitting EOB with mod I & bed rails. BP assessed in RUE: Dinamap: 135/102 mmHg, HR = 77 bpm Manual: 148/108 mmHg When asked if symptomatic pt reports diplopia. RN made aware of pt's elevated BP & diplopia. Therapist deferred treatment at this time 2/2 medical issues. Pt left sitting on EOB with bed alarm set, all needs within reach, and set up with meal tray - RN aware of pt's location.   Addendum: After treatment sessions, per MD during team conference, pt treatment should be limited to seated exercises if diastolic >110.  Therapy Documentation Precautions:  Precautions Precautions: Fall Restrictions Weight Bearing Restrictions: No   Pt missed 15 minutes of skilled PT treatment 2/2 medical issues (high BP).   See Function Navigator for Current Functional Status.   Therapy/Group: Individual Therapy  Sandi MariscalVictoria M Jazell Rosenau 10/16/2016, 2:55 PM

## 2016-10-16 NOTE — Progress Notes (Signed)
Social Work Corlene Sabia, Elveria Rising Social Worker Signed   Patient Care Conference Date of Service: 10/16/2016  3:09 PM      Hide copied text Hover for attribution information Inpatient RehabilitationTeam Conference and Plan of Care Update Date: 10/16/2016   Time: 2:50 PM      Patient Name: Erika Boyer      Medical Record Number: [id number]  Date of Birth: Oct 29, 1995 Sex: Female         Room/Bed: 4W04C/4W04C-01 Payor Info: Payor: MEDICARE / Plan: MEDICARE PART A AND B / Product Type: *No Product type* /     Admitting Diagnosis: debility  Admit Date/Time:  10/12/2016  5:11 PM Admission Comments: No comment available    Primary Diagnosis:  <principal problem not specified> Principal Problem: <principal problem not specified>       Patient Active Problem List    Diagnosis Date Noted  . Debility 10/12/2016  . Leg swelling    . Shortness of breath    . Cerebral atrophy    . MR (mental retardation)    . Drug-induced lupus erythematosus    . Tachycardia    . Hypokalemia    . Acute blood loss anemia    . S/P thoracentesis    . Sepsis (HCC) 10/03/2016  . Acute respiratory failure with hypoxia (HCC) 10/03/2016  . CAP (community acquired pneumonia) 10/03/2016  . Hyperthyroidism 10/03/2016  . Morbid obesity with BMI of 40.0-44.9, adult (HCC) 10/03/2016  . Acute kidney injury (HCC) 10/03/2016  . 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 Microcytic anemia 10/03/2016  . Cardiomegaly 10/03/2016  . Acute respiratory failure (HCC) 10/03/2016  . Lobar pneumonia (HCC)    . Pleural effusion    . Pleural effusion, left    . Graves disease 08/23/2016      Expected Discharge Date: Expected Discharge Date: 10/25/16   Team Members Present: Physician leading conference: Dr. Faith Rogue Social Worker Present: Dossie Der, LCSW Nurse Present: Carmie End, RN PT Present: Aleda Grana, PT OT Present: Callie Fielding, OT SLP Present: Feliberto Gottron, SLP PPS Coordinator present : Tora Duck, RN, CRRN       Current Status/Progress Goal Weekly Team Focus  Medical     debility after multiple medical, chemical -induced lupus. pnuemonia/fluid overload. multipke ongoing medical issues  improve activity tolerane  pulmonary rx, id rx, bp control    Bowel/Bladder     continent of b/b, urgency with urine at times. LBM 5/27. up to BR with one assist with walker, or at night, use BSC due to weakness.  mod I assist with b/b  monitor b/b q shift and prn   Swallow/Nutrition/ Hydration               ADL's     Mod A bathing w/c level at sink, Max A UB/LB dressing, Min A toileting/toilet transfer with RW  Supervision-Mod I overall  Balance, UB strengthening, activity tolerance, safety awareness    Mobility     min assist for transfers (sit<>stand, stand pivot), min assist for gait with RW, poor safety awareness  mod I overall, supervision for stair negotiation  pt education, endurance, balance, strengthening, safety awareness   Communication               Safety/Cognition/ Behavioral Observations   Min-Mod A  Mod I-Supervision   recall with use of aids, problem solving and awareness    Pain     rarely c/o pain, takes 2 tylenol q 4 hours prn with relief  maintain pain </= 2  monitor q shift and prn   Skin     bruising to belly due to lovenox injections, BLE edema 2-3+, 3+ pedal edema bilat., obese with belly fold, no masd, no breakdown   maintain skin without breakdown while on IPR  monitor skin q shift and prn, lasix for edema, protective skin products prn     Rehab Goals Patient on target to meet rehab goals: Yes *See Care Plan and progress notes for long and short-term goals.   Barriers to Discharge: poor stamina, limited initiation     Possible Resolutions to Barriers:  increase engagement and acgtivity tolerance     Discharge Planning/Teaching Needs:  Plan to return home with mother who works a few hours / week.      Team Discussion:  Team plans to downgrade goals to  supervision level due to safety risk and impulsive. MD pushing fluids, creatine is going down. Zosyn being stopped tonight. Still asjusting BP meds. Double vision at times. Needs cueing for safety. Pt reluctant at times to participate in therapies.  Revisions to Treatment Plan:  Downgrading goals to supervision level and discharge 6/7    Continued Need for Acute Rehabilitation Level of Care: The patient requires daily medical management by a physician with specialized training in physical medicine and rehabilitation for the following conditions: Daily direction of a multidisciplinary physical rehabilitation program to ensure safe treatment while eliciting the highest outcome that is of practical value to the patient.: Yes Daily medical management of patient stability for increased activity during participation in an intensive rehabilitation regime.: Yes Daily analysis of laboratory values and/or radiology reports with any subsequent need for medication adjustment of medical intervention for : Neurological problems;Blood pressure problems;Pulmonary problems   Lucy ChrisDupree, Kara Mierzejewski G 10/16/2016, 3:09 PM       Patient ID: Erika Boyer, female   DOB: [birth year], 21 y.o.   MRN: [id number]

## 2016-10-16 NOTE — Progress Notes (Signed)
Occupational Therapy Session Note  Patient Details  Name: Baxter FlatteryOmaure Straker MRN: [id number] Date of Birth: [birth year]  Today's Date: 10/16/2016 OT Individual Time: [id number] OT Individual Time Calculation (min): 58 min    Short Term Goals: Week 1:  OT Short Term Goal 1 (Week 1): Pt will complete 3 grooming tasks in standing in order to increase functional activity tolerance OT Short Term Goal 2 (Week 1): Pt will dress LB with set-up/ dupervision using AE PRN OT Short Term Goal 3 (Week 1): Pt will complete 3/3 toileting tasks with supervision OT Short Term Goal 4 (Week 1): Pt will tolerate full bathing/dressing session with no more than 2 rest breaks  Skilled Therapeutic Interventions/Progress Updates:    Upon entering the room, pt supine in bed awaiting therapist. Pt performs supine >sit on EOB with BP checked on R UE with results of 148/99. OT covering IV site and pt ambulating to bathroom with RW and min A for safety and balance. Pt required min A for balance with clothing management and hygiene after BM. Pt transferred onto TTB for bathing at shower level with cues for lateral leans to wash buttocks. Pt needing cues throughout session for safety awareness in shower as she is very impulsive with movement. Pt returned to sit on EOB for dressing with steady assistance for balance with LB clothing management. BP taken one pt is sitting with results of 144/109 taken in R UE. RN notified. Pt continues to be asymptomatic during this session. Pt returns to supine with bed alarm activated and call bell within reach upon exiting the room.   Therapy Documentation Precautions:  Precautions Precautions: Fall Restrictions Weight Bearing Restrictions: No General:   Vital Signs: Therapy Vitals BP: (!) 157/107 Patient Position (if appropriate): Sitting  See Function Navigator for Current Functional Status.   Therapy/Group: Individual Therapy  Alen BleacherBradsher, Nandi Tonnesen P 10/16/2016, 12:55 PM

## 2016-10-16 NOTE — Patient Care Conference (Signed)
Inpatient RehabilitationTeam Conference and Plan of Care Update Date: 10/16/2016   Time: 2:50 PM    Patient Name: Erika Boyer      Medical Record Number: [id number]  Date of Birth: March 06, 1996 Sex: Female         Room/Bed: 4W04C/4W04C-01 Payor Info: Payor: MEDICARE / Plan: MEDICARE PART A AND B / Product Type: *No Product type* /    Admitting Diagnosis: debility  Admit Date/Time:  10/12/2016  5:11 PM Admission Comments: No comment available   Primary Diagnosis:  <principal problem not specified> Principal Problem: <principal problem not specified>  Patient Active Problem List   Diagnosis Date Noted  . Debility 10/12/2016  . Leg swelling   . Shortness of breath   . Cerebral atrophy   . MR (mental retardation)   . Drug-induced lupus erythematosus   . Tachycardia   . Hypokalemia   . Acute blood loss anemia   . S/P thoracentesis   . Sepsis (HCC) 10/03/2016  . Acute respiratory failure with hypoxia (HCC) 10/03/2016  . CAP (community acquired pneumonia) 10/03/2016  . Hyperthyroidism 10/03/2016  . Morbid obesity with BMI of 40.0-44.9, adult (HCC) 10/03/2016  . Acute kidney injury (HCC) 10/03/2016  . 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 Microcytic anemia 10/03/2016  . Cardiomegaly 10/03/2016  . Acute respiratory failure (HCC) 10/03/2016  . Lobar pneumonia (HCC)   . Pleural effusion   . Pleural effusion, left   . Graves disease 08/23/2016    Expected Discharge Date: Expected Discharge Date: 10/25/16  Team Members Present: Physician leading conference: Dr. Faith Rogue Social Worker Present: Dossie Der, LCSW Nurse Present: Carmie End, RN PT Present: Aleda Grana, PT OT Present: Callie Fielding, OT SLP Present: Feliberto Gottron, SLP PPS Coordinator present : Tora Duck, RN, CRRN     Current Status/Progress Goal Weekly Team Focus  Medical   debility after multiple medical, chemical -induced lupus. pnuemonia/fluid overload. multipke ongoing medical issues  improve  activity tolerane  pulmonary rx, id rx, bp control    Bowel/Bladder   continent of b/b, urgency with urine at times. LBM 5/27. up to BR with one assist with walker, or at night, use BSC due to weakness.  mod I assist with b/b  monitor b/b q shift and prn   Swallow/Nutrition/ Hydration             ADL's   Mod A bathing w/c level at sink, Max A UB/LB dressing, Min A toileting/toilet transfer with RW  Supervision-Mod I overall  Balance, UB strengthening, activity tolerance, safety awareness    Mobility   min assist for transfers (sit<>stand, stand pivot), min assist for gait with RW, poor safety awareness  mod I overall, supervision for stair negotiation  pt education, endurance, balance, strengthening, safety awareness   Communication             Safety/Cognition/ Behavioral Observations  Min-Mod A  Mod I-Supervision   recall with use of aids, problem solving and awareness    Pain   rarely c/o pain, takes 2 tylenol q 4 hours prn with relief  maintain pain </= 2  monitor q shift and prn   Skin   bruising to belly due to lovenox injections, BLE edema 2-3+, 3+ pedal edema bilat., obese with belly fold, no masd, no breakdown   maintain skin without breakdown while on IPR  monitor skin q shift and prn, lasix for edema, protective skin products prn    Rehab Goals Patient on target to meet rehab goals: Yes *See  Care Plan and progress notes for long and short-term goals.  Barriers to Discharge: poor stamina, limited initiation    Possible Resolutions to Barriers:  increase engagement and acgtivity tolerance    Discharge Planning/Teaching Needs:  Plan to return home with mother who works a few hours / week.      Team Discussion:  Team plans to downgrade goals to supervision level due to safety risk and impulsive. MD pushing fluids, creatine is going down. Zosyn being stopped tonight. Still asjusting BP meds. Double vision at times. Needs cueing for safety. Pt reluctant at times to  participate in therapies.  Revisions to Treatment Plan:  Downgrading goals to supervision level and discharge 6/7   Continued Need for Acute Rehabilitation Level of Care: The patient requires daily medical management by a physician with specialized training in physical medicine and rehabilitation for the following conditions: Daily direction of a multidisciplinary physical rehabilitation program to ensure safe treatment while eliciting the highest outcome that is of practical value to the patient.: Yes Daily medical management of patient stability for increased activity during participation in an intensive rehabilitation regime.: Yes Daily analysis of laboratory values and/or radiology reports with any subsequent need for medication adjustment of medical intervention for : Neurological problems;Blood pressure problems;Pulmonary problems  Oakes Mccready, Lemar LivingsRebecca G 10/16/2016, 3:09 PM

## 2016-10-16 NOTE — Progress Notes (Signed)
Patient continues to have B/P elevated throughout the  Day. Clonidine 0.1 mg po given prn with B/P remaining elevated. Patient denied symptoms of elevated B/P this am but reported she is having double vision at times this pm. P. Love, PA notified and Dr. Riley KillSwartz aware of elevated B/P throughout day. P. Love, PA entered new orders. Patient verbalized understanding when instructed by RN to report any abnormal symptoms this pm. Reviewed with patients's mother safety awareness and plan for diet education and making better food choices to support low sodium diet. Patient's mother receptive and encouraged patient to follow diet plan . Continue with plan of care.  Cleotilde NeerJoyce, [place] Fant S

## 2016-10-17 ENCOUNTER — Inpatient Hospital Stay (HOSPITAL_COMMUNITY): Payer: Medicare Other | Admitting: Speech Pathology

## 2016-10-17 ENCOUNTER — Inpatient Hospital Stay (HOSPITAL_COMMUNITY): Payer: Medicare Other | Admitting: Occupational Therapy

## 2016-10-17 ENCOUNTER — Inpatient Hospital Stay (HOSPITAL_COMMUNITY): Payer: Medicare Other | Admitting: Physical Therapy

## 2016-10-17 ENCOUNTER — Encounter (HOSPITAL_COMMUNITY): Payer: Medicare Other | Admitting: Psychology

## 2016-10-17 DIAGNOSIS — N289 Disorder of kidney and ureter, unspecified: Secondary | ICD-10-CM

## 2016-10-17 DIAGNOSIS — F09 Unspecified mental disorder due to known physiological condition: Secondary | ICD-10-CM

## 2016-10-17 DIAGNOSIS — R0602 Shortness of breath: Secondary | ICD-10-CM

## 2016-10-17 DIAGNOSIS — R0989 Other specified symptoms and signs involving the circulatory and respiratory systems: Secondary | ICD-10-CM

## 2016-10-17 LAB — BASIC METABOLIC PANEL
Anion gap: 8 (ref 5–15)
BUN: 18 mg/dL (ref 6–20)
CHLORIDE: 103 mmol/L (ref 101–111)
CO2: 29 mmol/L (ref 22–32)
CREATININE: 2.11 mg/dL — AB (ref 0.44–1.00)
Calcium: 8.5 mg/dL — ABNORMAL LOW (ref 8.9–10.3)
GFR calc Af Amer: 38 mL/min — ABNORMAL LOW (ref >60)
GFR calc non Af Amer: 33 mL/min — ABNORMAL LOW (ref >60)
GLUCOSE: 86 mg/dL (ref 65–99)
Potassium: 3.9 mmol/L (ref 3.5–5.1)
Sodium: 140 mmol/L (ref 135–145)

## 2016-10-17 NOTE — Progress Notes (Addendum)
Physical Therapy Session Note  Patient Details  Name: Erika Boyer MRN: [id number] Date of Birth: [birth year]  Today's Date: 10/17/2016 PT Individual Time: 1447-1531 PT Individual Time Calculation (min): 44 min   Short Term Goals: Week 1:  PT Short Term Goal 1 (Week 1): Pt will negotiate 12 steps with 1 rail and min assist. PT Short Term Goal 2 (Week 1): Pt will ambulate 75 ft with LRAD & supervision.  Skilled Therapeutic Interventions/Progress Updates:  Pt seen to make up missed time from previous date.   Pt received in room & agreeable to tx. Pt ambulated within room to w/c with min assist. Session focused on transfers, safety awareness, BLE strengthening, and standing tolerance. Transported pt to gym via w/c total assist for time management. Pt completes stand pivot w/c<>mat table with steady assist, arm rests, and without AD. Pt completes sit<>stand transfers without BUE with descending seat heights with task focusing on BLE strengthening, balance when standing without support, and eccentric lowering; pt required supervision overall. Pt's BP elevated after task & therapist provided instructional cuing for pursed lip breathing and relaxation techniques & pt's BP lowered. In day room pt tolerated standing 8 minutes with UE support while engaging in puzzle assembly with task focusing on standing tolerance, endurance training & BLE strengthening. Pt requires supervision overall but relies heavily on knee extension to maintain standing. Pt unable to stand without UE support. At end of session pt left in handoff to OT.  Therapy Documentation Precautions:  Precautions Precautions: Fall Restrictions Weight Bearing Restrictions: No  Vital Signs: Sitting at rest at beginning of session: BP = 140/103 mmHg (RUE, sitting, dinamap) HR = 84 bpm  After multiple repetitions of sit<>stand: BP = 184/114 mmHg (RUE, sitting, dinamap) HR = 78 bpm Pt asymptomatic.  After breathing & relaxation  techniques: BP = 151/102 mmHg (RUE, dinamap, sitting) HR = 84 bpm   Pain: Denies c/o pain.   See Function Navigator for Current Functional Status.   Therapy/Group: Individual Therapy  Sandi MariscalVictoria M Santos Hardwick 10/17/2016, 3:40 PM

## 2016-10-17 NOTE — Progress Notes (Signed)
Physical Therapy Session Note  Patient Details  Name: Erika Boyer MRN: [id number] Date of Birth: 16-May-1996  Today's Date: 10/17/2016 PT Individual Time: 0806-0901 PT Individual Time Calculation (min): 55 min   Short Term Goals: Week 1:  PT Short Term Goal 1 (Week 1): Pt will negotiate 12 steps with 1 rail and min assist. PT Short Term Goal 2 (Week 1): Pt will ambulate 75 ft with LRAD & supervision.  Skilled Therapeutic Interventions/Progress Updates:  Pt received in room & agreeable to tx. Session focused on balance during gait, gait, safety awareness, BLE strengthening & overall endurance training. Throughout session therapist frequently educated pt on need for increased safety awareness with all tasks; pt with poor understanding & demo during session. Pt completes stand pivot and sit<>stand transfers with min assist demonstrating impaired safety awareness & impaired eccentric control. Pt also requires cuing for steering walker as she will frequently chose to pick it up or run into objects. Gait training x 100 ft + 50 ft with RW & min assist with pt demonstrating weak hips BLE and impaired balance. Pt negotiated 12 steps (6") with B rails and min assist with decreased eccentric control when descending stairs. Pt utilized nu-step on Level 3 x 5 minutes + Level 5 x 5 minutes with BLE only with task focusing on BLE strengthening & endurance training. At end of session pt left in bed with alarm set, all needs within reach, & visitor present.  Encouraged pt to have mother [name] attend some therapy sessions but pt resistant to this idea.   Therapy Documentation Precautions:  Precautions Precautions: Fall Restrictions Weight Bearing Restrictions: No   Vital Signs: Therapy Vitals  At rest, sitting EOB: Pulse Rate: 85 BP: (!) 140/100 Patient Position (if appropriate): Sitting  After ambulating 100 ft: BP = 131/98 mmHg (RUE, dinamap, sitting) HR = 83 bpm  After using nu-step: BP  = 148/108 mmHg (RUE, dinamap, sitting) HR = 91 bpm  Pain: Denies c/o pain.   See Function Navigator for Current Functional Status.   Therapy/Group: Individual Therapy  Sandi MariscalVictoria M Miller 10/17/2016, 9:45 AM

## 2016-10-17 NOTE — Progress Notes (Signed)
Occupational Therapy Session Note  Patient Details  Name: Erika Boyer MRN: [id number] Date of Birth: 09-18-1995  Today's Date: 10/17/2016 OT Individual Time: 1400-1500 and 1531-1600 OT Individual Time Calculation (min): 60 min and 29 min    Short Term Goals: Week 1:  OT Short Term Goal 1 (Week 1): Pt will complete 3 grooming tasks in standing in order to increase functional activity tolerance OT Short Term Goal 2 (Week 1): Pt will dress LB with set-up/ dupervision using AE PRN OT Short Term Goal 3 (Week 1): Pt will complete 3/3 toileting tasks with supervision OT Short Term Goal 4 (Week 1): Pt will tolerate full bathing/dressing session with no more than 2 rest breaks  Skilled Therapeutic Interventions/Progress Updates:    Session 1:  Upon entering the room, pt seated on EOB awaiting therapist for shower. Pt stating, " I need to wash my hair." BP taken with pt seated in R UE with results of 156/98 this session. Pt ambulating with RW and min A to gather clothing items from [employer]UM! Brandsdresser. Pt ambulating to bathroom for toileting with overall steady assistance for clothing hygiene and removing LB clothing while seated. Pt transferring to shower with supervision overall with min verbal cues for safety and min A when pt standing to wash buttocks and peri area. Pt dressing while seated on edge of bed with steady assistance for balance with LB clothing management. Pt remained seated on EOB at end of session with call bell and all needed items within reach.   Session 2: Pt received in dayroom with PT and transitioned easily to OT session. Pt involved in standing task when arriving and pt standing for an additional 5 minutes with close supervision - CGA for balance while assembling puzzle. Pt taking seated rest break and BP taken in R UE with results of 144/99 and pt asymptomatic. OT propels pt via wheelchair to ADL apartment. OT demonstrates use of RW to transfer onto TTB. OT educating pt on recommended use  of TTB at discharge in order to reduce fall risk. Pt refusing to attempt transfer and is unset about recommendation. OT continues to provide education to pt regarding realistic expectations for safety when going home. Education to continue. Pt returned to room and seated in recliner chair with friend present in the room. Call bell and all needed items within reach.   Therapy Documentation Precautions:  Precautions Precautions: Fall Restrictions Weight Bearing Restrictions: No General:   Vital Signs: Therapy Vitals Temp: 98.5 F (36.9 C) Temp Source: Oral Pulse Rate: 88 Resp: 18 BP: (!) 156/98 Patient Position (if appropriate): Sitting Oxygen Therapy SpO2: 99 % O2 Device: Not Delivered Pain:   ADL:   Vision   Perception    Praxis   Exercises:   Other Treatments:    See Function Navigator for Current Functional Status.   Therapy/Group: Individual Therapy  Alen BleacherBradsher, [name] 10/17/2016, 2:01 PM

## 2016-10-17 NOTE — Progress Notes (Signed)
Erika Boyer PHYSICAL MEDICINE & REHABILITATION     PROGRESS NOTE    Subjective/Complaints: Sitting up in w/c with therapy. No distress. Slept last night. Minimal pain  ROS: pt denies nausea, vomiting, diarrhea, cough, shortness of breath or chest pain     Objective: Vital Signs: Blood pressure (!) 148/108, pulse 91, temperature 98.2 F (36.8 C), temperature source Oral, resp. rate 18, height 5\' 5"  (1.651 m), weight 109.6 kg (241 lb 10 oz), last menstrual period 10/08/2016, SpO2 99 %. Dg Chest 2 View  Result Date: 10/16/2016 CLINICAL DATA:  Shortness of breath. EXAM: CHEST  2 VIEW COMPARISON:  10/13/2016 FINDINGS: Two views study shows decrease in the bilateral pleural effusions, left greater than right with persistent bibasilar atelectasis. Cardiopericardial silhouette appears enlarged but stable. The visualized bony structures of the thorax are intact. IMPRESSION: Decreasing bilateral pleural effusions with bilateral basilar atelectasis. Electronically Signed   By: Kennith CenterEric  Mansell M.D.   On: 10/16/2016 07:49    Recent Labs  10/16/16 0902  WBC 10.0  HGB 8.6*  HCT 27.2*  PLT 316    Recent Labs  10/16/16 0902 10/17/16 0640  NA 142 140  K 3.5 3.9  CL 103 103  GLUCOSE 117* 86  BUN 15 18  CREATININE 2.00* 2.11*  CALCIUM 8.5* 8.5*   CBG (last 3)  No results for input(s): GLUCAP in the last 72 hours.  Wt Readings from Last 3 Encounters:  10/17/16 109.6 kg (241 lb 10 oz)  10/12/16 122.5 kg (270 lb)  08/23/16 118.5 kg (261 lb 3.2 oz)    Physical Exam:  Constitutional: She is oriented to person, place, and time. She appears well-developedand well-nourished. No distress.  HENT:  Head: Normocephalicand atraumatic.  Mouth/Throat: Oropharynx is clear and moist.  Eyes: PERRL  Neck:  . Neck supple.  Cardiovascular:  RRR  Respiratory: no wheezes or rales.  GI: Soft. Bowel sounds are normal. She exhibits no distension. There is no rebound.  Musculoskeletal: She exhibits  edema. She exhibits no tenderness.  1+  edema BLE Neurological: She is alertand oriented to person, place, and time.  Skin: Skin is warmand dry.  Marland Kitchen.  Psychiatric: a little flat but cooperative. Fair insight and awareness   Assessment/Plan: 1. Functional and mobility deficits secondary to sepsis/debility which require 3+ hours per day of interdisciplinary therapy in a comprehensive inpatient rehab setting. Physiatrist is providing close team supervision and 24 hour management of active medical problems listed below. Physiatrist and rehab team continue to assess barriers to discharge/monitor patient progress toward functional and medical goals.  Function:  Bathing Bathing position   Position: Wheelchair/chair at sink  Bathing parts Body parts bathed by patient: Right arm, Left arm, Chest, Abdomen, Front perineal area, Buttocks, Right upper leg, Left upper leg Body parts bathed by helper: Right lower leg, Left lower leg, Back  Bathing assist Assist Level: Touching or steadying assistance(Pt > 75%)      Upper Body Dressing/Undressing Upper body dressing   What is the patient wearing?: Pull over shirt/dress     Pull over shirt/dress - Perfomed by patient: Thread/unthread left sleeve, Thread/unthread right sleeve, Put head through opening, Pull shirt over trunk Pull over shirt/dress - Perfomed by helper: Thread/unthread right sleeve, Put head through opening, Pull shirt over trunk        Upper body assist Assist Level: Supervision or verbal cues, Set up   Set up : To obtain clothing/put away  Lower Body Dressing/Undressing Lower body dressing   What is  the patient wearing?: Underwear, Pants, Non-skid slipper socks Underwear - Performed by patient: Thread/unthread right underwear leg, Thread/unthread left underwear leg Underwear - Performed by helper: Thread/unthread right underwear leg, Thread/unthread left underwear leg Pants- Performed by patient: Thread/unthread right pants leg,  Thread/unthread left pants leg Pants- Performed by helper: Thread/unthread right pants leg, Thread/unthread left pants leg Non-skid slipper socks- Performed by patient: Don/doff right sock, Don/doff left sock Non-skid slipper socks- Performed by helper: Don/doff right sock, Don/doff left sock                  Lower body assist Assist for lower body dressing: Touching or steadying assistance (Pt > 75%)      Toileting Toileting   Toileting steps completed by patient: Adjust clothing prior to toileting, Performs perineal hygiene, Adjust clothing after toileting Toileting steps completed by helper: Adjust clothing prior to toileting, Performs perineal hygiene, Adjust clothing after toileting (per Nilsa Nutting, NT report) Toileting Assistive Devices: Grab bar or Educational psychologist Assist level: Touching or steadying assistance (Pt.75%)   Transfers Chair/bed transfer   Chair/bed transfer method: Ambulatory Chair/bed transfer assist level: Touching or steadying assistance (Pt > 75%) Chair/bed transfer assistive device: Patent attorney     Max distance: 10 ft Assist level: Touching or steadying assistance (Pt > 75%)   Wheelchair   Type: Manual Max wheelchair distance: 41ft  Assist Level: Touching or steadying assistance (Pt > 75%)  Cognition Comprehension Comprehension assist level: Follows basic conversation/direction with extra time/assistive device  Expression Expression assist level: Expresses basic needs/ideas: With extra time/assistive device  Social Interaction Social Interaction assist level: Interacts appropriately 75 - 89% of the time - Needs redirection for appropriate language or to initiate interaction.  Problem Solving Problem solving assist level: Solves basic 50 - 74% of the time/requires cueing 25 - 49% of the time  Memory Memory assist level: Recognizes or recalls 50 - 74% of the time/requires cueing 25 - 49% of the time   Medical Problem  List and Plan: 1. Debility secondary to Sepsis, lupuswith multiple medical issues.  -continue therapies  -pt with safety issues but a lot of this appears to be baseline  -will discuss further with mother 2. DVT Prophylaxis/Anticoagulation: Pharmaceutical: Lovenox 3. Pain Management: tylenol prn as well as increase in mobility should help with back pain.  4. Mood: LCSW to follow for evaluation and support.  5. Neuropsych: This patient is not fully capable of making decisions on hisown behalf. 6. Skin/Wound Care: routine pressure relief measures.   protein supplement for low protein stores and to help with edema.  7. Fluids/Electrolytes/Nutrition:  Replace potassium  -encourage PO 8. CAP with SOB: nebulizers, IS/FV  -5/25 with ?LLL > RLL infiltrate/effusions. Today's cxr with decreased effusions, ?atelectasis  -vanc/zosyn started---has been afebrile.   -zosyn dc/ed  -lasix iniitated as well over weekend--held 9. Pleural effusion:   repeat CXR with improvement---continue to monitor. 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  prednisone 40 mg daily- . To follow up with Dr. Nickola Major after dischage 11. Peripheral edema: Monitor weights daily.  12. Iron deficiency anemia: Improved with transfusion. Add iron supplement. :  13. Tachycardia: Monitor BP/HR bid --continue metoprolol bid.  14. Hyperthyroid: off methamazole.  15. ARI: lasix held  -vanc stopped 2 days ago  -reviewed appropriate fluid intake with patient, RN to encourage  -serial labs 16. Labile HTN: metoprolol increased to 50mg  BID  -now on cardizem 20mg  q8  -sl decrease in diastolic bP  over last 12 hours but still around 100  -clonidine prn  -introduce HCTZ when Cr improves    LOS (Days) 5 A FACE TO FACE EVALUATION WAS PERFORMED  Ranelle Oyster, MD 10/17/2016 8:57 AM

## 2016-10-17 NOTE — Consult Note (Signed)
Neuropsychological Consultation   Patient:   Erika Boyer   DOB:   1995/09/08  MR Number:  [id number]  [facility] [address] [id number] [facility] [place] [id number] Dept: [phone] Loc: [phone]           Date of Service:   10/17/2016  Start Time:   9 AM End Time:   10 AM  Provider/Observer:  Erika PhenixJohn Nellene Boyer, Psy.D.       Clinical Neuropsychologist       Billing Code/Service: [phone] 4 Units  Chief Complaint:    The patient has been having difficulty with ADL and mobility impacting ADLs.  She has a history of cerebral viral illness at age 21 creating cerebral atrophy and cognitive deficients.  These are most noted for expressive language speed, mental operation speed, fine motor deficits and ataxia.  Reduced intellectual functioning and developmental delays after illness.    Reason for Service:  The patient was referred for neuropsychological consultation due to coping issues with being in hospital and for cognitive screening.    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 CT chest reviewed, showing pleural and pericardial effusion. She was found to have large left pleural effusion due to CAP or viral illness as well as large pericardial effusion. she was intubated in ED and underwent left thoracocentesis for evacuation of 850 cc of cloudy exudative pleural effusion. She was placed on IV antibiotics as well as steroids due to concerns of drug induced SLE.. 2D echo with EF 60-65% with severe circumferential effusion with early tamponade and on 5/17 and she underwent pericardiocentesis of 870 cc of  serosanguineous fluid. She was placed on colchicine empirically by Dr. Jacinto Boyer due to concerns of acute peritonitis v/s viral pericarditis with recommendations to repeat echo 1-2 days prior to discharge. 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 [facility] hospitalist has discussed case with Dr. Zenovia JordanAngela Boyer who recommended continuing prednisone 40 mg daily till follow up in office (they will contact patient with appt). Repeat 2 D echo on 5/23 showed EF 55-60% with small to moderate partially loculated pericardial effusion with fibrinous strands c/w hemopericardium, organized thrombus and no chamber collapse or tamponade. AKI has improved with hydration. Anemia of chronic disease with drop in H/H to 6.8/21.8 treated with on unit PRBC. Her respiratory status and endurance is showing improvement. Therapy ongoing and she was noted to to be debilitated with substantial deficits in balance, mobility and decreased ability to carry out ADL tasks. CIR recommended for follow up therapy  Current Status:  The patient is reporting some frustration and stress with being in the hospital.  She reports that she can get agitated with change and that this has been true since her viral infection at age 21.  Behavioral Observation: Erika Boyer  presents as a 21 y.o.-year-old Right African American Female who appeared her stated age. her dress was Appropriate and she was Well Groomed and her manners were Appropriate to the situation.  her participation was indicative of Appropriate and Attentive behaviors.  There were physical disabilities noted related  to leg weakness.  she displayed an appropriate level of cooperation and motivation.     Interactions:    Active Appropriate and Attentive  Attention:   abnormal and attention span appeared shorter than expected for age  Memory:   abnormal; recent memory intact, remote  memory impaired  Visuo-spatial:  within normal limits  Speech (Volume):  low  Speech:   Slow production with some articulation issues  Thought Process:  Coherent  Though Content:  WNL;   Orientation:   person, place, time/date and situation  Judgment:   Fair  Planning:   Fair  Affect:    Anxious  Mood:    Anxious  Insight:   Fair  Intelligence:   low  Medical History:   Past Medical History:  Diagnosis Date  . Anemia   . Anxiety   . Arthritis    "right knee" (10/11/2016)  . Cerebellar degeneration   . Chronic bronchitis (HCC)   . Daily headache   . Depression   . Graves disease 2012   hx; "was on RX; it disappeared" (10/11/2016)  . History of blood transfusion    "related to the anemia" (10/11/2016)  . Hypertension   . Lupus    "not sure what kind" (10/11/2016)  . Pneumonia 10/03/2016            Family Med/Psych History:  Family History  Problem Relation Age of Onset  . Thalassemia Mother   . Arthritis Mother   . High blood pressure Father   . Mental illness Father        question bipolar disorder per wife  . Sickle cell trait Maternal Aunt     Risk of Suicide/Violence: virtually non-existent Patient denies SI or HI  Impression/DX:  The patient is a 21 year old with history of cerebral atrophy subsequent to viral infection in her brain when she was 21 years old.  Her verbal fluency, motor speed, information processing speed, and fine motor control are mild to moderately impaired.  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  She does not have intellectual disabilities (mental retardation) as her cognitive deficits are due to injury.  The patient is aware and able to make decision and follow instructions.  She will be able to comprehend the efforts of physical and occupational therapies.    Disposition/Plan:  The patient is coping better now and will be able to learn and adaptive to rehabilitative efforts.        Electronically  Signed   _______________________ Erika Boyer, Psy.D.

## 2016-10-17 NOTE — Progress Notes (Signed)
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  Pt agreeable to working on standing balance and endurance while braiding his hair. Friend sat in arm chair in front of recliner, pt stood up from recliner with S and was able to tolerate standing 4-5 min at a time for 3 cycles. Pt stated she needed to sit due to upper back pain. On the 3rd cycle, she had a minor loss of balance with min A to correct.   Discussed with pt that her upper back pain may be coming from her arm positioning with the hair braiding as this just started.  Recommended to pt that she do a cross body arm stretch before and after braiding to stretch her traps.  Also provided her with a long sock with 2 tennis balls for her to do self massage with. Pt practiced using this by leaning against the sock against the back of the recliner.  Pt stated she like the way the massage balls felt.  Pt resting in room prior to next therapy session.     Nijah Tejera 10/17/2016, 10:45 AM

## 2016-10-17 NOTE — Progress Notes (Signed)
Speech Language Pathology Daily Session Note  Patient Details  Name: Erika Boyer MRN: [id number] Date of Birth: 1995/09/09  Today's Date: 10/17/2016 SLP Individual Time: 1030-1100 SLP Individual Time Calculation (min): 30 min  Short Term Goals: Week 1: SLP Short Term Goal 1 (Week 1): Patient will safely sequence transfers with recall for hand placement with walker during sit to stand and stand to sit with Supervision assist verbal cues SLP Short Term Goal 2 (Week 1): Patient will demonstrate ability to recall current medications, functions, and frequencies with Min assist verbal cues to utilize external aid SLP Short Term Goal 3 (Week 1): Patient will recall recommended diet restrictions with Supervision assist to multimodal cues SLP Short Term Goal 4 (Week 1): Patient will utilize working memory strategies for accurate completion of multi-step directions with Min assist verbal cues   Skilled Therapeutic Interventions: Pt was seen in am for cognitive treatment. Pt states she is responsible for her own medication management at home. Due to this hospital stay she is taking additional meds as compared to before. Pt was resistant to using a pill box, stating "it was too much trouble" Pt was educated on how using a pill box can acturally decrease the amount of time that she spends on keeping track of meds. She completed medication management task with min cues with 100% acc. Pt was encouraged to  ask her mother to check behind her to make sure she is filling the pill box correctly. Pt states she doesn't want any help with it. Pt was left sitting in her recliner chair with call bell available.      Function:    Cognition Comprehension Comprehension assist level: Follows basic conversation/direction with extra time/assistive device  Expression   Expression assist level: Expresses basic needs/ideas: With extra time/assistive device  Social Interaction Social Interaction assist level: Interacts  appropriately 75 - 89% of the time - Needs redirection for appropriate language or to initiate interaction.  Problem Solving Problem solving assist level: Solves basic 50 - 74% of the time/requires cueing 25 - 49% of the time  Memory Memory assist level: Recognizes or recalls 50 - 74% of the time/requires cueing 25 - 49% of the time    Pain Pain Assessment Pain Assessment: No/denies pain  Therapy/Group: Individual Therapy  Isaac LaudJulie G Abdiaziz Klahn 10/17/2016, 2:37 PM

## 2016-10-17 NOTE — Progress Notes (Signed)
Occupational Therapy Session Note  Patient Details  Name: Erika Boyer MRN: [id number] Date of Birth: 05-07-1996  Today's Date: 10/17/2016 OT Individual Time: 0730-0800 OT Individual Time Calculation (min): 30 min    Short Term Goals: Week 1:  OT Short Term Goal 1 (Week 1): Pt will complete 3 grooming tasks in standing in order to increase functional activity tolerance OT Short Term Goal 2 (Week 1): Pt will dress LB with set-up/ dupervision using AE PRN OT Short Term Goal 3 (Week 1): Pt will complete 3/3 toileting tasks with supervision OT Short Term Goal 4 (Week 1): Pt will tolerate full bathing/dressing session with no more than 2 rest breaks  Skilled Therapeutic Interventions/Progress Updates:    Upon entering the room, pt seated in recliner chair and finishing breakfast. BP taken in R UE with results of 139/93. Pt requesting grooming at sink with min A for ambulating with use of RW. Pt standing at sink for 9 minutes while performing grooming tasks with steady assistance for balance. Pt ambulating 20' in room and out into hallway with 5 LOB to the R requiring mod A for balance. Pt completely lifting RW up from floor and leaning laterally to the R. Pt with very fast pace and unable to change directions and attempts to lift/move RW after running into items vs. Changing route. Pt returning to sit in recliner chair with friend present in room. Call bell and all needed items within reach upon exiting the room.   Therapy Documentation Precautions:  Precautions Precautions: Fall Restrictions Weight Bearing Restrictions: No Vital Signs: Therapy Vitals Temp: 98.5 F (36.9 C) Temp Source: Oral Pulse Rate: 84 Resp: 18 BP: (!) 151/102 Patient Position (if appropriate): Sitting Oxygen Therapy SpO2: 99 % O2 Device: Not Delivered Pain: Pain Assessment Pain Assessment: No/denies pain  See Function Navigator for Current Functional Status.   Therapy/Group: Individual  Therapy  Alen BleacherBradsher, Dahna Hattabaugh P 10/17/2016, 4:59 PM

## 2016-10-18 ENCOUNTER — Inpatient Hospital Stay (HOSPITAL_COMMUNITY): Payer: Medicare Other | Admitting: Speech Pathology

## 2016-10-18 ENCOUNTER — Inpatient Hospital Stay (HOSPITAL_COMMUNITY): Payer: Medicare Other | Admitting: Physical Therapy

## 2016-10-18 ENCOUNTER — Inpatient Hospital Stay (HOSPITAL_COMMUNITY): Payer: Medicare Other | Admitting: Occupational Therapy

## 2016-10-18 DIAGNOSIS — I10 Essential (primary) hypertension: Secondary | ICD-10-CM

## 2016-10-18 DIAGNOSIS — F09 Unspecified mental disorder due to known physiological condition: Secondary | ICD-10-CM

## 2016-10-18 MED ORDER — LABETALOL HCL 100 MG PO TABS
100.0000 mg | ORAL_TABLET | Freq: Three times a day (TID) | ORAL | Status: DC
  Administered 2016-10-18 – 2016-10-19 (×3): 100 mg via ORAL
  Filled 2016-10-18 (×3): qty 1

## 2016-10-18 NOTE — Progress Notes (Signed)
Occupational Therapy Session Note  Patient Details  Name: Erika Boyer MRN: [id number] Date of Birth: 1996/01/10  Today's Date: 10/18/2016 OT Individual Time: 1530-1601 OT Individual Time Calculation (min): 31 min    Short Term Goals: Week 1:  OT Short Term Goal 1 (Week 1): Pt will complete 3 grooming tasks in standing in order to increase functional activity tolerance OT Short Term Goal 2 (Week 1): Pt will dress LB with set-up/ dupervision using AE PRN OT Short Term Goal 3 (Week 1): Pt will complete 3/3 toileting tasks with supervision OT Short Term Goal 4 (Week 1): Pt will tolerate full bathing/dressing session with no more than 2 rest breaks  Skilled Therapeutic Interventions/Progress Updates:    Upon entering the room, pt seated on EOB awaiting therapist and stating, " I want to shower." BP taken from R UE with results of 144/106 and RN notified. Pt ambulating with RW to bathroom with min A for balance and safety. Pt seated on TTB for bathing at shower level with close supervision for safety. Pt ambulating from bathroom to bed for dressing with 1 LOB to the R requiring mod A to correct. Pt donning UB clothing with set up A and LB self care with steady assistance for standing balance with clothing management. Pt returned to seated position. Call bell and all needed items within reach upon exiting the room. RN notified of pt resting.   Therapy Documentation Precautions:  Precautions Precautions: Fall Restrictions Weight Bearing Restrictions: Yes General: General PT Missed Treatment Reason: Other (Comment) Vital Signs: Therapy Vitals Temp: 98.5 F (36.9 C) Temp Source: Oral Pulse Rate: 72 Resp: 19 BP: (!) 140/96 Patient Position (if appropriate): Lying Oxygen Therapy SpO2: 98 % O2 Device: Not Delivered  See Function Navigator for Current Functional Status.   Therapy/Group: Individual Therapy  Alen BleacherBradsher, Wandell Scullion P 10/18/2016, 4:11 PM

## 2016-10-18 NOTE — Progress Notes (Signed)
Speech Language Pathology Daily Session Note  Patient Details  Name: Erika Boyer MRN: [id number] Date of Birth: 02-27-1996  Today's Date: 10/18/2016 SLP Individual Time: 0930-1030 SLP Individual Time Calculation (min): 60 min  Short Term Goals: Week 1: SLP Short Term Goal 1 (Week 1): Patient will safely sequence transfers with recall for hand placement with walker during sit to stand and stand to sit with Supervision assist verbal cues SLP Short Term Goal 2 (Week 1): Patient will demonstrate ability to recall current medications, functions, and frequencies with Min assist verbal cues to utilize external aid SLP Short Term Goal 3 (Week 1): Patient will recall recommended diet restrictions with Supervision assist to multimodal cues SLP Short Term Goal 4 (Week 1): Patient will utilize working memory strategies for accurate completion of multi-step directions with Min assist verbal cues   Skilled Therapeutic Interventions: Pt was seen in am for cognitive linguistic treatment in a semi distracting environment. Pt was able to perform sequencing activity with mod cues for more information. Pt completed cause and effect activity with 90% acc. Pt completed simple problem solving activity with 60% acc. Pt was able to recall walker safety with min cues. She was able to recall her daily meds with min assist, but needed max cues for what they were for and their frequency. Pt completed cross word puzzle requiring max assist from therapist. Pt would benefit from continued treatment focusing on goals of care. Pt left comfortable in bed with alarm on, call bell in reach and family at the bedside.      Function:  Eating Eating                 Cognition Comprehension Comprehension assist level: Follows basic conversation/direction with extra time/assistive device  Expression   Expression assist level: Expresses basic needs/ideas: With extra time/assistive device  Social Interaction Social  Interaction assist level: Interacts appropriately 75 - 89% of the time - Needs redirection for appropriate language or to initiate interaction.  Problem Solving Problem solving assist level: Solves basic 50 - 74% of the time/requires cueing 25 - 49% of the time  Memory Memory assist level: Recognizes or recalls 50 - 74% of the time/requires cueing 25 - 49% of the time    Pain Pain Assessment Pain Assessment: No/denies pain Pain Score: 0-No pain  Therapy/Group: Individual Therapy  Isaac LaudJulie G Jonluke Cobbins 10/18/2016, 10:21 AM

## 2016-10-18 NOTE — Progress Notes (Signed)
[facility] PHYSICAL MEDICINE & REHABILITATION     PROGRESS NOTE    Subjective/Complaints: Sitting EOB. Doesn't feel well. Says she has nausea, feels a little dizzy. Yet trying to eat breakfast.  ROS: pt denies nausea, vomiting, diarrhea, cough, shortness of breath or chest pain    Objective: Vital Signs: Blood pressure (!) 165/113, pulse 67, temperature 98.7 F (37.1 C), temperature source Oral, resp. rate 18, height 5\' 5"  (1.651 m), weight 109.7 kg (241 lb 13.5 oz), last menstrual period 10/08/2016, SpO2 96 %. No results found.  Recent Labs  10/16/16 0902  WBC 10.0  HGB 8.6*  HCT 27.2*  PLT 316    Recent Labs  10/16/16 0902 10/17/16 0640  NA 142 140  K 3.5 3.9  CL 103 103  GLUCOSE 117* 86  BUN 15 18  CREATININE 2.00* 2.11*  CALCIUM 8.5* 8.5*   CBG (last 3)  No results for input(s): GLUCAP in the last 72 hours.  Wt Readings from Last 3 Encounters:  10/18/16 109.7 kg (241 lb 13.5 oz)  10/12/16 122.5 kg (270 lb)  08/23/16 118.5 kg (261 lb 3.2 oz)    Physical Exam:  Constitutional: She is oriented to person, place, and time. She appears well-developedand well-nourished. No distress.  HENT:  Head: Normocephalicand atraumatic.  Mouth/Throat: Oropharynx is clear and moist.  Eyes: PERRL  Neck:  . Neck supple.  Cardiovascular:  RRR  Respiratory: CTA with normal effort  GI: Soft. Bowel sounds are normal. She exhibits no distension. There is no rebound.  Musculoskeletal:  She exhibits no tenderness.  1+  edema BLE Neurological: She is alertand oriented to person, place, and time. CN 2-12 normal. Good sitting balance Skin: Skin is warmand dry.  Marland Kitchen  Psychiatric: a little flat but cooperative. Fair insight and awareness   Assessment/Plan: 1. Functional and mobility deficits secondary to sepsis/debility which require 3+ hours per day of interdisciplinary therapy in a comprehensive inpatient rehab setting. Physiatrist is providing close team supervision and 24  hour management of active medical problems listed below. Physiatrist and rehab team continue to assess barriers to discharge/monitor patient progress toward functional and medical goals.  Function:  Bathing Bathing position   Position: Shower  Bathing parts Body parts bathed by patient: Right arm, Left arm, Chest, Abdomen, Front perineal area, Buttocks, Right upper leg, Left upper leg, Right lower leg, Left lower leg Body parts bathed by helper: Right lower leg, Left lower leg, Back  Bathing assist Assist Level: Touching or steadying assistance(Pt > 75%)      Upper Body Dressing/Undressing Upper body dressing   What is the patient wearing?: Pull over shirt/dress     Pull over shirt/dress - Perfomed by patient: Thread/unthread left sleeve, Thread/unthread right sleeve, Put head through opening, Pull shirt over trunk Pull over shirt/dress - Perfomed by helper: Thread/unthread right sleeve, Put head through opening, Pull shirt over trunk        Upper body assist Assist Level: Supervision or verbal cues   Set up : To obtain clothing/put away  Lower Body Dressing/Undressing Lower body dressing   What is the patient wearing?: Underwear, Pants, Non-skid slipper socks Underwear - Performed by patient: Thread/unthread right underwear leg, Thread/unthread left underwear leg, Pull underwear up/down Underwear - Performed by helper: Thread/unthread right underwear leg, Thread/unthread left underwear leg Pants- Performed by patient: Thread/unthread right pants leg, Thread/unthread left pants leg, Pull pants up/down Pants- Performed by helper: Thread/unthread right pants leg, Thread/unthread left pants leg Non-skid slipper socks- Performed by  patient: Don/doff right sock, Don/doff left sock Non-skid slipper socks- Performed by helper: Don/doff right sock, Don/doff left sock                  Lower body assist Assist for lower body dressing: Touching or steadying assistance (Pt > 75%)       Toileting Toileting   Toileting steps completed by patient: Adjust clothing prior to toileting, Performs perineal hygiene, Adjust clothing after toileting Toileting steps completed by helper: Adjust clothing prior to toileting, Performs perineal hygiene, Adjust clothing after toileting (per Nilsa NuttingPaula Brennan, NT report) Toileting Assistive Devices: Grab bar or rail  Toileting assist Assist level: Touching or steadying assistance (Pt.75%)   Transfers Chair/bed transfer   Chair/bed transfer method: Stand pivot Chair/bed transfer assist level: Supervision or verbal cues Chair/bed transfer assistive device: Armrests     Locomotion Ambulation     Max distance: 20' Assist level: Touching or steadying assistance (Pt > 75%)   Wheelchair   Type: Manual Max wheelchair distance: 850ft  Assist Level: Touching or steadying assistance (Pt > 75%)  Cognition Comprehension Comprehension assist level: Follows basic conversation/direction with extra time/assistive device  Expression Expression assist level: Expresses basic needs/ideas: With extra time/assistive device  Social Interaction Social Interaction assist level: Interacts appropriately 75 - 89% of the time - Needs redirection for appropriate language or to initiate interaction.  Problem Solving Problem solving assist level: Solves basic 50 - 74% of the time/requires cueing 25 - 49% of the time  Memory Memory assist level: Recognizes or recalls 50 - 74% of the time/requires cueing 25 - 49% of the time   Medical Problem List and Plan: 1. Debility secondary to Sepsis, lupuswith multiple medical issues.  -continue therapies  -continue to mobilize with therapy symptom permitting, if DBP <110 2. DVT Prophylaxis/Anticoagulation: Pharmaceutical: Lovenox 3. Pain Management: tylenol prn as well as increase in mobility should help with back pain.  4. Mood: LCSW to follow for evaluation and support.  5. Neuropsych: This patient is not  fully capable of making decisions on hisown behalf. 6. Skin/Wound Care: routine pressure relief measures.   protein supplement for low protein stores and to help with edema.  7. Fluids/Electrolytes/Nutrition:  Replace potassium  -encourage PO 8. CAP with SOB: nebulizers, IS/FV  -5/25 with ?LLL > RLL infiltrate/effusions. Most recent cxr with decreased effusions, ?atelectasis  -vanc/zosyn started---has been afebrile.   -zosyn dc/ed  -lasix iniitated as well over weekend--held 9. Pleural effusion:   repeat CXR with improvement---continue to monitor. 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  prednisone 40 mg daily- . To follow up with Dr. Nickola MajorHawkes after dischage 11. Peripheral edema: Monitor weights daily.  12. Iron deficiency anemia: Improved with transfusion. Add iron supplement. :  13. Tachycardia: Monitor BP/HR bid --labetalol 14. Hyperthyroid: off methamazole.  15. ARI: lasix held  -vanc stopped 2 days ago  -reviewed appropriate fluid intake with patient, RN to encourage  -serial labs--recheck bmet tomorrow 16. Malignant HTN: bp remains poorly controlled  -change metoprolol to labetalol to see if we can push higher without causing excessive bradycrdia  -continue on cardizem 20mg  q8  --clonidine prn  -potentially introduce HCTZ when Cr improves    LOS (Days) 6 A FACE TO FACE EVALUATION WAS PERFORMED  Ranelle OysterSWARTZ,ZACHARY T, MD 10/18/2016 9:09 AM

## 2016-10-18 NOTE — Progress Notes (Signed)
Physical Therapy Note  Patient Details  Name: Baxter FlatteryOmaure Ellerman MRN: [id number] Date of Birth: 07-Jul-1995 Today's Date: 10/18/2016    Missed minutes:  60 minutes  Upon entering room, therapist took pt BP which was:  143/110.  Per nursing, pt requires medications to decrease BP.  Session held until later time to allow BP meds to work.  Following wait, nursing reports that the patients BP has elevated to 154/115.  Therapy held due to elevated resting BP.  Nursing in agreement and stated that pt would receive large dose of BP meds at 1400.  Discussed with OT who sees pt at 1530.  Therapist additionally discussed pt elevated BP with therapy supervisor as well.  Gabbie Marzo Elveria RisingM Elgene Coral 10/18/2016, 12:45 PM

## 2016-10-18 NOTE — Progress Notes (Signed)
Occupational Therapy Note  Patient Details  Name: Erika Boyer MRN: [id number] Date of Birth: [birth year]  Today's Date: 10/18/2016 OT Individual Time: 0700-0725 OT Individual Time Calculation (min): 25 min  and Today's Date: 10/18/2016 OT Missed Time: 35 Minutes Missed Time Reason: Patient ill (comment)  Upon entering the room, pt seated on EOB eating breakfast. Pt with c/o stomach pain this session. BP taken on R UE with results of 165/113. RN notified and reports BP medication given at 0630 prior to session. Pt requesting to continue eating breakfast and continued c/o stomach pain. OT providing education to pt regarding progress towards goals and focus for continued therapeutic intervention. Missed OT time secondary to hypertension and pt feeling unwell. Bed alarm activated and call bell within reach. Friend present in room.    Alen BleacherBradsher, [name] P 10/18/2016, 7:38 AM

## 2016-10-19 ENCOUNTER — Inpatient Hospital Stay (HOSPITAL_COMMUNITY): Payer: Medicare Other | Admitting: Physical Therapy

## 2016-10-19 ENCOUNTER — Inpatient Hospital Stay (HOSPITAL_COMMUNITY): Payer: Medicare Other | Admitting: Occupational Therapy

## 2016-10-19 ENCOUNTER — Inpatient Hospital Stay (HOSPITAL_COMMUNITY): Payer: Medicare Other | Admitting: Speech Pathology

## 2016-10-19 DIAGNOSIS — N179 Acute kidney failure, unspecified: Secondary | ICD-10-CM

## 2016-10-19 LAB — BASIC METABOLIC PANEL
ANION GAP: 9 (ref 5–15)
BUN: 13 mg/dL (ref 6–20)
CHLORIDE: 104 mmol/L (ref 101–111)
CO2: 27 mmol/L (ref 22–32)
Calcium: 8.8 mg/dL — ABNORMAL LOW (ref 8.9–10.3)
Creatinine, Ser: 1.61 mg/dL — ABNORMAL HIGH (ref 0.44–1.00)
GFR calc Af Amer: 52 mL/min — ABNORMAL LOW (ref >60)
GFR calc non Af Amer: 45 mL/min — ABNORMAL LOW (ref >60)
GLUCOSE: 92 mg/dL (ref 65–99)
POTASSIUM: 3.9 mmol/L (ref 3.5–5.1)
Sodium: 140 mmol/L (ref 135–145)

## 2016-10-19 LAB — TSH: TSH: 1.769 u[IU]/mL (ref 0.350–4.500)

## 2016-10-19 LAB — T4, FREE: Free T4: 0.95 ng/dL (ref 0.61–1.12)

## 2016-10-19 MED ORDER — PREDNISONE 5 MG PO TABS
30.0000 mg | ORAL_TABLET | Freq: Every day | ORAL | Status: DC
  Administered 2016-10-20 – 2016-10-22 (×3): 30 mg via ORAL
  Filled 2016-10-19 (×5): qty 1

## 2016-10-19 MED ORDER — LABETALOL HCL 300 MG PO TABS
150.0000 mg | ORAL_TABLET | Freq: Three times a day (TID) | ORAL | Status: DC
  Administered 2016-10-19 – 2016-10-25 (×18): 150 mg via ORAL
  Filled 2016-10-19 (×18): qty 1

## 2016-10-19 MED ORDER — PROSIGHT PO TABS
1.0000 | ORAL_TABLET | Freq: Every day | ORAL | Status: DC
  Administered 2016-10-19 – 2016-10-25 (×7): 1 via ORAL
  Filled 2016-10-19 (×7): qty 1

## 2016-10-19 MED ORDER — LOSARTAN POTASSIUM 50 MG PO TABS
50.0000 mg | ORAL_TABLET | Freq: Every day | ORAL | Status: DC
  Administered 2016-10-19 – 2016-10-25 (×7): 50 mg via ORAL
  Filled 2016-10-19 (×6): qty 1

## 2016-10-19 MED ORDER — OCUVITE-LUTEIN PO CAPS
1.0000 | ORAL_CAPSULE | Freq: Every day | ORAL | Status: DC
  Administered 2016-10-19: 1 via ORAL
  Filled 2016-10-19: qty 1

## 2016-10-19 NOTE — Progress Notes (Signed)
Occupational Therapy Session Note  Patient Details  Name: Erika Boyer MRN: [id number] Date of Birth: 04-Sep-1995  Today's Date: 10/19/2016 OT Individual Time: [id number] OT Individual Time Calculation (min): 58 min    Short Term Goals: Week 1:  OT Short Term Goal 1 (Week 1): Pt will complete 3 grooming tasks in standing in order to increase functional activity tolerance OT Short Term Goal 2 (Week 1): Pt will dress LB with set-up/ dupervision using AE PRN OT Short Term Goal 3 (Week 1): Pt will complete 3/3 toileting tasks with supervision OT Short Term Goal 4 (Week 1): Pt will tolerate full bathing/dressing session with no more than 2 rest breaks Week 2:     Skilled Therapeutic Interventions/Progress Updates:    Tx focus on functional transfers, endurance, and walker safety.   Pt greeted in bathroom. She had just pulled call bell for assist off of toilet. Pt stood to complete hygiene and ambulated out of bathroom with Min A and RW to wash her hands/brush teeth. BP assessed at EOB (breakfast had just arrived). 140/122. RN notified and provided medication. Continued d/c planning/discussion regarding DME needs while pt ate breakfast. BP afterwards 135/103. Pt requesting shower. Pt ambulated to dresser for clothing retrieval, then into bathroom with cues for walker safety. She doffed clothing on toilet and proceeded to shower bench with Min A. Pt completed bathing with overall supervision and extra time. Dressing was completed at EOB with overall steady assist without device. Teds donned with Total A. Pt left EOB at time of departure with bed alarm activated. Pt verbalized that she would not get up without staff assist.   Pt reported pulling her IV out last night. Per pt, she was half asleep and it was already coming out of her arm  Therapy Documentation Precautions:  Precautions Precautions: Fall Restrictions Weight Bearing Restrictions: Yes   Vital Signs: Therapy Vitals Pulse Rate:  82 BP: (!) 180/120 Patient Position (if appropriate): Sitting Pain:  Pain Assessment Pain Assessment: No/denies pain Pain Score: 0-No pain Faces Pain Scale: No hurt PAINAD (Pain Assessment in Advanced Dementia) Breathing: normal Negative Vocalization: none Facial Expression: smiling or inexpressive Body Language: relaxed Consolability: no need to console PAINAD Score: 0 ADL:  :    See Function Navigator for Current Functional Status.   Therapy/Group: Individual Therapy  Tyauna Lacaze A Cayce Quezada 10/19/2016, 11:54 AM

## 2016-10-19 NOTE — Progress Notes (Signed)
Physical Therapy Weekly Progress Note  Patient Details  Name: Erika Boyer MRN: [id number] Date of Birth: 1996/02/12  Beginning of progress report period: Oct 13, 2016 End of progress report period: October 19, 2016  Today's Date: 10/19/2016   Patient has met 0 of 2 short term goals.  Pt is making fair progress towards LTG's. Pt continues to be limited by elevated BP leading ot modified treatment sessions. Pt continues to demonstrate very poor safety awareness overall, despite max education. Pt is limited by BLE weakness and significantly impaired balance. Pt would benefit from continued skilled PT treatment to focus on BLE strengthening, balance, coordination, endurance training, stair training, safety awareness, and pt/family education.   Patient continues to demonstrate the following deficits muscle weakness, decreased cardiorespiratory endurance, decreased coordination, decreased awareness, decreased problem solving, decreased safety awareness, decreased memory and delayed processing, and decreased standing balance, decreased postural control and decreased balance strategies and therefore will continue to benefit from skilled PT intervention to increase functional independence with mobility.  Patient progressing toward long term goals. LTG's have previously been downgraded to Supervision overall.   PT Short Term Goals Week 1:  PT Short Term Goal 1 (Week 1): Pt will negotiate 12 steps with 1 rail and min assist. PT Short Term Goal 1 - Progress (Week 1): Progressing toward goal PT Short Term Goal 2 (Week 1): Pt will ambulate 75 ft with LRAD & supervision. PT Short Term Goal 2 - Progress (Week 1): Progressing toward goal Week 2:  PT Short Term Goal 1 (Week 2): STG = LTG due to anticipated d/c date.    Therapy Documentation Precautions:  Precautions Precautions: Fall Restrictions Weight Bearing Restrictions: Yes   See Function Navigator for Current Functional Status.  Therapy/Group:  Individual Therapy  Waunita Schooner 10/19/2016, 12:03 PM

## 2016-10-19 NOTE — Progress Notes (Signed)
Pam Love PA notified of manuel Blood pressure of 180/110 . Catapress given early. Instructions to recheck in one hour.

## 2016-10-19 NOTE — Progress Notes (Signed)
[place] PHYSICAL MEDICINE & REHABILITATION     PROGRESS NOTE    Subjective/Complaints: Up in shower. Feeling better today. Denies sob  .  ROS: pt denies nausea, vomiting, diarrhea, cough, shortness of breath or chest pain    Objective: Vital Signs: Blood pressure (!) 158/106, pulse 84, temperature 99.7 F (37.6 C), temperature source Oral, resp. rate 18, height 5\' 5"  (1.651 m), weight 109.8 kg (242 lb 1 oz), last menstrual period 10/08/2016, SpO2 98 %. No results found.  Recent Labs  10/16/16 0902  WBC 10.0  HGB 8.6*  HCT 27.2*  PLT 316    Recent Labs  10/16/16 0902 10/17/16 0640  NA 142 140  K 3.5 3.9  CL 103 103  GLUCOSE 117* 86  BUN 15 18  CREATININE 2.00* 2.11*  CALCIUM 8.5* 8.5*   CBG (last 3)  No results for input(s): GLUCAP in the last 72 hours.  Wt Readings from Last 3 Encounters:  10/19/16 109.8 kg (242 lb 1 oz)  10/12/16 122.5 kg (270 lb)  08/23/16 118.5 kg (261 lb 3.2 oz)    Physical Exam:  Constitutional: She is oriented to person, place, and time. She appears well-developedand well-nourished. No distress.  HENT:  Head: Normocephalicand atraumatic.  Mouth/Throat: Oropharynx is clear and moist.  Eyes: EOMI  Neck:  . Neck supple.  Cardiovascular:  RRR  Respiratory: CTA with normal effort  GI: Soft. Bowel sounds are normal. She exhibits no distension. There is no rebound.  Musculoskeletal:  She exhibits no tenderness.  1+  edema BLE Neurological: She is alertand oriented to person, place, and time. CN 2-12 normal. Good sitting balance Skin: Skin is warmand dry.  Marland Kitchen  Psychiatric: a little flat but cooperative. Fair insight and awareness   Assessment/Plan: 1. Functional and mobility deficits secondary to sepsis/debility which require 3+ hours per day of interdisciplinary therapy in a comprehensive inpatient rehab setting. Physiatrist is providing close team supervision and 24 hour management of active medical problems listed  below. Physiatrist and rehab team continue to assess barriers to discharge/monitor patient progress toward functional and medical goals.  Function:  Bathing Bathing position   Position: Shower  Bathing parts Body parts bathed by patient: Right arm, Left arm, Chest, Abdomen, Front perineal area, Buttocks, Right upper leg, Left upper leg, Right lower leg, Left lower leg Body parts bathed by helper: Right lower leg, Left lower leg, Back  Bathing assist Assist Level: Supervision or verbal cues      Upper Body Dressing/Undressing Upper body dressing   What is the patient wearing?: Pull over shirt/dress     Pull over shirt/dress - Perfomed by patient: Thread/unthread left sleeve, Thread/unthread right sleeve, Put head through opening, Pull shirt over trunk Pull over shirt/dress - Perfomed by helper: Thread/unthread right sleeve, Put head through opening, Pull shirt over trunk        Upper body assist Assist Level: Set up, Supervision or verbal cues   Set up : To obtain clothing/put away  Lower Body Dressing/Undressing Lower body dressing   What is the patient wearing?: Underwear, Pants Underwear - Performed by patient: Thread/unthread right underwear leg, Thread/unthread left underwear leg, Pull underwear up/down Underwear - Performed by helper: Thread/unthread right underwear leg, Thread/unthread left underwear leg Pants- Performed by patient: Thread/unthread right pants leg, Thread/unthread left pants leg, Pull pants up/down Pants- Performed by helper: Thread/unthread right pants leg, Thread/unthread left pants leg Non-skid slipper socks- Performed by patient: Don/doff right sock, Don/doff left sock Non-skid slipper socks- Performed  by helper: Don/doff right sock, Don/doff left sock                  Lower body assist Assist for lower body dressing: Touching or steadying assistance (Pt > 75%)      Toileting Toileting   Toileting steps completed by patient: Adjust clothing  prior to toileting, Performs perineal hygiene, Adjust clothing after toileting Toileting steps completed by helper: Adjust clothing prior to toileting, Performs perineal hygiene, Adjust clothing after toileting (per Nilsa NuttingPaula Brennan, NT report) Toileting Assistive Devices: Grab bar or rail  Toileting assist Assist level: Touching or steadying assistance (Pt.75%)   Transfers Chair/bed transfer   Chair/bed transfer method: Stand pivot Chair/bed transfer assist level: Supervision or verbal cues Chair/bed transfer assistive device: Armrests     Locomotion Ambulation     Max distance: 10' Assist level: Touching or steadying assistance (Pt > 75%)   Wheelchair   Type: Manual Max wheelchair distance: 8250ft  Assist Level: Touching or steadying assistance (Pt > 75%)  Cognition Comprehension Comprehension assist level: Follows basic conversation/direction with extra time/assistive device  Expression Expression assist level: Expresses basic needs/ideas: With extra time/assistive device  Social Interaction Social Interaction assist level: Interacts appropriately 75 - 89% of the time - Needs redirection for appropriate language or to initiate interaction.  Problem Solving Problem solving assist level: Solves basic 50 - 74% of the time/requires cueing 25 - 49% of the time  Memory Memory assist level: Recognizes or recalls 50 - 74% of the time/requires cueing 25 - 49% of the time   Medical Problem List and Plan: 1. Debility secondary to Sepsis, lupuswith multiple medical issues.  -continue therapies  -continue to mobilize with therapy symptom permitting, if DBP <110 2. DVT Prophylaxis/Anticoagulation: Pharmaceutical: Lovenox 3. Pain Management: tylenol prn as well as increase in mobility should help with back pain.  4. Mood: LCSW to follow for evaluation and support.  5. Neuropsych: This patient is not fully capable of making decisions on hisown behalf. 6. Skin/Wound Care: routine  pressure relief measures.   protein supplement for low protein stores and to help with edema.  7. Fluids/Electrolytes/Nutrition:  Replace potassium  -encourage PO 8. CAP with SOB: nebulizers, IS/FV  -5/25 with ?LLL > RLL infiltrate/effusions. Most recent cxr with decreased effusions, ?atelectasis  -vanc/zosyn started---has been afebrile.   -zosyn dc/ed  -lasix iniitated as well over weekend--held 9. Pleural effusion:   repeat CXR with improvement---continue to monitor. 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  prednisone 40 mg daily- . To follow up with Dr. Nickola MajorHawkes after dischage 11. Peripheral edema: Monitor weights daily.  12. Iron deficiency anemia: Improved with transfusion. Add iron supplement. :  13. Tachycardia: Monitor BP/HR bid --labetalol 14. Hyperthyroid: off methamazole currently.  15. ARI: lasix held  -vanc stopped earlier in the week  -reviewed appropriate fluid intake with patient, RN to encourage  -serial labs--bmet pending for today 16. Malignant HTN: bp remains difficult to control  -changed metoprolol to labetalol to see if we can push higher without causing excessive bradycrdia  -continue on cardizem 20mg  q8  -introduce losartan 50mg  (home medication per mother)  --clonidine prn  -potentially introduce HCTZ when Cr improves  -may need to decrease prednisone   I called mother and had an extensive discussion regarding her care plan. She was concerned why she wasn't on HCTZ and I explained to her the reason why. Greater than 50% of time during this encounter was spent counseling patient/family.  LOS (Days) 7 A FACE TO  FACE EVALUATION WAS PERFORMED  Ranelle Oyster, MD 10/19/2016 8:55 AM

## 2016-10-19 NOTE — Progress Notes (Signed)
Physical Therapy Session Note  Patient Details  Name: Erika Boyer MRN: [id number] Date of Birth: [birth year]  Today's Date: 10/19/2016 PT Individual Time: 1130-1150 PT Individual Time Calculation (min): 20 min     Skilled Therapeutic Interventions/Progress Updates:  Pt received sitting on EOB & agreeable to tx. Pt reporting nausea & holding L side of stomach - RN made aware. Pt's BP assessed:  At rest, sitting:  LUE: BP = 180/120 mmHg (dinamap) HR = 77 bpm  RUE: BP = 179/119 mmHg (dinamap) HR = 82 bpm  RUE: BP = 180/120 mmHg (manual BP)  RN made aware of pt's elevated BP and cleared pt to ambulate to bathroom. Pt required min assist with mod assist on 1-2 occasions 2/2 LOB to ambulate bed<>bathroom. Instructed pt to keep walker on floor, as she would frequently pick it up; pt also with poor obstacle avoidance & overall safety awareness. Pt with continent void & performed hand hygiene standing at sink. At end of session pt left in bed with alarm set, all needs within reach, & visitor present to supervise.   Pt missed 10 minutes of skilled PT treatment 2/2 elevated BP.   Therapy Documentation Precautions:  Precautions Precautions: Fall Restrictions Weight Bearing Restrictions: Yes  General: PT Amount of Missed Time (min): 10 Minutes PT Missed Treatment Reason:  (elevated BP)   See Function Navigator for Current Functional Status.   Therapy/Group: Individual Therapy  Sandi MariscalVictoria M Keniel Ralston 10/19/2016, 11:55 AM

## 2016-10-19 NOTE — Progress Notes (Signed)
Physical Therapy Session Note  Patient Details  Name: Erika Boyer MRN: [id number] Date of Birth: 1996/02/12  Today's Date: 10/19/2016 PT Individual Time: 1405-1507 PT Individual Time Calculation (min): 62 min   Short Term Goals: Week 1:  PT Short Term Goal 1 (Week 1): Pt will negotiate 12 steps with 1 rail and min assist. PT Short Term Goal 1 - Progress (Week 1): Progressing toward goal PT Short Term Goal 2 (Week 1): Pt will ambulate 75 ft with LRAD & supervision. PT Short Term Goal 2 - Progress (Week 1): Progressing toward goal Week 2:  PT Short Term Goal 1 (Week 2): STG = LTG due to anticipated d/c date.   Skilled Therapeutic Interventions/Progress Updates:   Pt received sitting EOB and agreeable to PT. PT assessed BP sitting EOB 133/90, HR: 103bpm. Pt performed ambulatory transfer to [facility] with min assist from PT. Min cues for AD management to prevent LOB.  PT Transported pt to rehab gym in [facility] for energy conservation. Gait training x 1110f with min assist from PT and min cues for proper UE support and stopping gait prior to adjusting pants. Pt noted to have improved AD management to avoid obstacles on the the R and L. Vital assessed following gait:  147/94 HR 95bpm.  PT instructed pt in stair negotiation x12 steps with BUE support on L rail ascending. Min assist from PT and mod cues for step to gait pattern. Pt demonstrated self selected step over step pattern for second half of steps. Step-to pattern for descent with moderate cues for UE placement.   PT instructed pt in nustep reciprocal movement and endurance training 6 min +4 min with 3 minute therapeutic rest break between. Vitals assessed following Nustep exercises 139/81, HR 92bpm.   Gait training in simulated home environment with RW and min assist from PT. Moderate cues for AD management to prevent poor use of RW in turns and improve safety.   Ambulatory transfer to the toilet with min assist from PT. Supervisio.n assist for  sit<>stand and clothing management standing at toilet  Patient returned too room and left sitting EOM with call bell in reach and all needs met.          Therapy Documentation Precautions:  Precautions Precautions: Fall Restrictions Weight Bearing Restrictions: Yes General: PT Amount of Missed Time (min): 10 Minutes PT Missed Treatment Reason:  (elevated BP) Vital Signs: Therapy Vitals Pulse Rate: 85 BP: (!) 144/93 Patient Position (if appropriate): Lying Oxygen Therapy O2 Device: Not Delivered Pain: Pain Assessment Pain Assessment: No/denies pain Pain Score: 0-No pain Faces Pain Scale: No hurt   See Function Navigator for Current Functional Status.   Therapy/Group: Individual Therapy  ALorie Phenix6/05/2016, 3:11 PM

## 2016-10-19 NOTE — Progress Notes (Signed)
Speech Language Pathology Weekly Progress Note  Patient Details  Name: Erika Boyer MRN: [id number] Date of Birth: 07-15-1995  Beginning of progress report period: Oct 12, 2016 End of progress report period: October 19, 2016  Short Term Goals: Week 1: SLP Short Term Goal 1 (Week 1): Patient will safely sequence transfers with recall for hand placement with walker during sit to stand and stand to sit with Supervision assist verbal cues SLP Short Term Goal 1 - Progress (Week 1): Not met SLP Short Term Goal 2 (Week 1): Patient will demonstrate ability to recall current medications, functions, and frequencies with Min assist verbal cues to utilize external aid SLP Short Term Goal 2 - Progress (Week 1): Not met SLP Short Term Goal 3 (Week 1): Patient will recall recommended diet restrictions with Supervision assist to multimodal cues SLP Short Term Goal 3 - Progress (Week 1): Met SLP Short Term Goal 4 (Week 1): Patient will utilize working memory strategies for accurate completion of multi-step directions with Min assist verbal cues  SLP Short Term Goal 4 - Progress (Week 1): Not met    New Short Term Goals: Week 2: SLP Short Term Goal 1 (Week 2): Patient will utilize working memory strategies for accurate completion of multi-step directions with Min assist verbal cues  SLP Short Term Goal 2 (Week 2): Patient will demonstrate ability to recall current medications, functions, and frequencies with Min assist verbal cues to utilize external aid SLP Short Term Goal 3 (Week 2): Patient will safely sequence transfers with recall for hand placement with walker during sit to stand and stand to sit with Supervision assist verbal cues  Weekly Progress Updates: Patient has made functional gains and has met 1 of 4 STG's this reporting period. Currently, patient continues to require overall Min A to safely sequence transfers, recall medications and follow multi-step commands. However, patient can recall her  current diet recommendations appropriately. Patient and family education is ongoing. Patient would benefit from continued skilled SLP intervention to maximize her cognitive function and overall functional independence prior to discharge.      Intensity: Minumum of 1-2 x/day, 30 to 90 minutes Frequency: 3 to 5 out of 7 days Duration/Length of Stay: 10/25/16 Treatment/Interventions: Cognitive remediation/compensation;Cueing hierarchy;Environmental controls;Functional tasks;Medication managment;Patient/family education  Weston Anna, [place], [place]    Weston Anna 10/19/2016, 3:22 PM

## 2016-10-19 NOTE — Progress Notes (Signed)
Speech Language Pathology Daily Session Note  Patient Details  Name: Erika Boyer MRN: [id number] Date of Birth: 11-22-1995  Today's Date: 10/19/2016 SLP Individual Time: 1045-1130 SLP Individual Time Calculation (min): 45 min  Short Term Goals: Week 1: SLP Short Term Goal 1 (Week 1): Patient will safely sequence transfers with recall for hand placement with walker during sit to stand and stand to sit with Supervision assist verbal cues SLP Short Term Goal 2 (Week 1): Patient will demonstrate ability to recall current medications, functions, and frequencies with Min assist verbal cues to utilize external aid SLP Short Term Goal 3 (Week 1): Patient will recall recommended diet restrictions with Supervision assist to multimodal cues SLP Short Term Goal 4 (Week 1): Patient will utilize working memory strategies for accurate completion of multi-step directions with Min assist verbal cues   Skilled Therapeutic Interventions: Pt was seen in am for cognitive treatment in a semi-distracting environment. Family was present during the session. Pt did not want to work on [employer] management today. Pt worked on sequencing for functional kitchen tasks with min assist, however pt needed mod cues for using her walker. She states she only uses walker at home when her feet are hurting. Completed reading comprehension task with mod cues for more info and for pt to re-read paragraph. Pt completed deduction exercise with 100% acc with min assist. Pt was able to state what kind of diet she was on and why. Pt is not happy with low sodium diet. Pt was able to recall events of yesterday of therapy session. She followed 2 step directions with min cues for repetition with 80% acc. Embedded directions with 60% acc.      Function:  Eating Eating     Eating Assist Level: No help, No cues           Cognition Comprehension Comprehension assist level: Follows basic conversation/direction with extra time/assistive device   Expression   Expression assist level: Expresses basic needs/ideas: With extra time/assistive device  Social Interaction Social Interaction assist level: Interacts appropriately 75 - 89% of the time - Needs redirection for appropriate language or to initiate interaction.  Problem Solving Problem solving assist level: Solves basic 50 - 74% of the time/requires cueing 25 - 49% of the time  Memory Memory assist level: Recognizes or recalls 50 - 74% of the time/requires cueing 25 - 49% of the time    Pain Pain Assessment Pain Assessment: No/denies pain Pain Score: 0-No pain Faces Pain Scale: No hurt  Therapy/Group: Individual Therapy  Isaac LaudJulie G Eila Runyan 10/19/2016, 2:20 PM

## 2016-10-20 ENCOUNTER — Inpatient Hospital Stay (HOSPITAL_COMMUNITY): Payer: Medicare Other | Admitting: *Deleted

## 2016-10-20 LAB — T3, FREE: T3 FREE: 2.1 pg/mL (ref 2.0–4.4)

## 2016-10-20 LAB — BASIC METABOLIC PANEL
Anion gap: 9 (ref 5–15)
BUN: 12 mg/dL (ref 6–20)
CALCIUM: 8.7 mg/dL — AB (ref 8.9–10.3)
CO2: 26 mmol/L (ref 22–32)
CREATININE: 1.54 mg/dL — AB (ref 0.44–1.00)
Chloride: 100 mmol/L — ABNORMAL LOW (ref 101–111)
GFR calc Af Amer: 55 mL/min — ABNORMAL LOW (ref >60)
GFR, EST NON AFRICAN AMERICAN: 48 mL/min — AB (ref >60)
Glucose, Bld: 77 mg/dL (ref 65–99)
Potassium: 3.8 mmol/L (ref 3.5–5.1)
SODIUM: 135 mmol/L (ref 135–145)

## 2016-10-20 NOTE — Progress Notes (Signed)
Physical Therapy Session Note  Patient Details  Name: Erika Boyer MRN: [id number] Date of Birth: 1996/01/28  Today's Date: 10/20/2016 PT Individual Time: 0940-1010 PT Individual Time Calculation (min): 30 min   Short Term Goals: Week 1:  PT Short Term Goal 1 (Week 1): Pt will negotiate 12 steps with 1 rail and min assist. PT Short Term Goal 1 - Progress (Week 1): Progressing toward goal PT Short Term Goal 2 (Week 1): Pt will ambulate 75 ft with LRAD & supervision. PT Short Term Goal 2 - Progress (Week 1): Progressing toward goal Week 2:  PT Short Term Goal 1 (Week 2): STG = LTG due to anticipated d/c date.   Skilled Therapeutic Interventions/Progress Updates:  Tx focused on therex and functional mobility teraining within cardiorespiratory endurance limits. Pt resting in bed, agreeable to PT. BP 148/107  Supine<>sti with S.  Sit<>stand with min -guard A from bed and toilet with safety cues to RW.  Gait in room with RW 2x12' with Min A tro steady due to multi-directional instability  Pt stood at sink x653min with close S and body resting on sink for support. Standing BP 147/110  Seated BP was the same.  Supine BP 124/75 RN made aware.  Supine therex: clam shells, heel slides, bridging and lower trunk rotations 2x10 each with cues for technique.  Pt left in bed with all needs in reach.       Therapy Documentation Precautions:  Precautions Precautions: Fall Restrictions Weight Bearing Restrictions: No General:   Vital Signs: Therapy Vitals Pulse Rate: 89 BP: 124/75 Patient Position (if appropriate): Lying Pain: none     See Function Navigator for Current Functional Status.   Therapy/Group: Individual Therapy  Eulalie Speights, Chrisandra NettersOLE M  Ravneet Spilker, PT, DPT  10/20/2016, 10:04 AM

## 2016-10-20 NOTE — Progress Notes (Addendum)
Erika Boyer PHYSICAL MEDICINE & REHABILITATION     PROGRESS NOTE    Subjective/Complaints: Up at eob. Feels sl nauseas, occasionally dizzy.   ROS: pt denies  vomiting, diarrhea, cough, shortness of breath or chest pain   Objective: Vital Signs: Blood pressure (!) 169/93, pulse 92, temperature 98.6 F (37 C), temperature source Oral, resp. rate 18, height 5\' 5"  (1.651 m), weight 108.8 kg (239 lb 13.8 oz), last menstrual period 10/08/2016, SpO2 98 %. No results found. No results for input(s): WBC, HGB, HCT, PLT in the last 72 hours.  Recent Labs  10/19/16 0902 10/20/16 0625  NA 140 135  K 3.9 3.8  CL 104 100*  GLUCOSE 92 77  BUN 13 12  CREATININE 1.61* 1.54*  CALCIUM 8.8* 8.7*   CBG (last 3)  No results for input(s): GLUCAP in the last 72 hours.  Wt Readings from Last 3 Encounters:  10/20/16 108.8 kg (239 lb 13.8 oz)  10/12/16 122.5 kg (270 lb)  08/23/16 118.5 kg (261 lb 3.2 oz)    Physical Exam:  Constitutional: She is oriented to person, place, and time. She appears well-developedand well-nourished. No distress.  HENT:  Head: Normocephalicand atraumatic.  Mouth/Throat: Oropharynx is clear and moist.  Eyes: EOMI  Neck:  . Neck supple.  Cardiovascular:  RRR  Respiratory: CTA  GI: Soft. Bowel sounds are normal. She exhibits no distension. There is no rebound.  Musculoskeletal:  She exhibits no tenderness.  Tr to 1+  edema BLE Neurological: She is alertand oriented to person, place, and time. CN 2-12 normal. Good sitting balance Skin: Skin is warmand dry.  Marland Kitchen.  Psychiatric: a little flat but cooperative. Fair insight and awareness   Assessment/Plan: 1. Functional and mobility deficits secondary to sepsis/debility which require 3+ hours per day of interdisciplinary therapy in a comprehensive inpatient rehab setting. Physiatrist is providing close team supervision and 24 hour management of active medical problems listed below. Physiatrist and rehab team  continue to assess barriers to discharge/monitor patient progress toward functional and medical goals.  Function:  Bathing Bathing position   Position: Shower  Bathing parts Body parts bathed by patient: Right arm, Left arm, Chest, Abdomen, Front perineal area, Buttocks, Right upper leg, Left upper leg, Right lower leg, Left lower leg Body parts bathed by helper: Right lower leg, Left lower leg, Back  Bathing assist Assist Level: Supervision or verbal cues      Upper Body Dressing/Undressing Upper body dressing   What is the patient wearing?: Pull over shirt/dress     Pull over shirt/dress - Perfomed by patient: Thread/unthread left sleeve, Thread/unthread right sleeve, Put head through opening, Pull shirt over trunk Pull over shirt/dress - Perfomed by helper: Thread/unthread right sleeve, Put head through opening, Pull shirt over trunk        Upper body assist Assist Level: Set up   Set up : To obtain clothing/put away  Lower Body Dressing/Undressing Lower body dressing   What is the patient wearing?: Underwear, Pants, Ted Hose, Non-skid slipper socks Underwear - Performed by patient: Thread/unthread right underwear leg, Thread/unthread left underwear leg, Pull underwear up/down Underwear - Performed by helper: Thread/unthread right underwear leg, Thread/unthread left underwear leg Pants- Performed by patient: Thread/unthread right pants leg, Thread/unthread left pants leg, Pull pants up/down Pants- Performed by helper: Thread/unthread right pants leg, Thread/unthread left pants leg Non-skid slipper socks- Performed by patient: Don/doff right sock, Don/doff left sock Non-skid slipper socks- Performed by helper: Don/doff right sock, Don/doff left sock  TED Hose - Performed by helper: Don/doff right TED hose, Don/doff left TED hose  Lower body assist Assist for lower body dressing: Touching or steadying assistance (Pt > 75%)      Toileting Toileting    Toileting steps completed by patient: Performs perineal hygiene, Adjust clothing after toileting Toileting steps completed by helper: Adjust clothing prior to toileting, Performs perineal hygiene, Adjust clothing after toileting Toileting Assistive Devices: Grab bar or rail  Toileting assist Assist level: Touching or steadying assistance (Pt.75%)   Transfers Chair/bed transfer   Chair/bed transfer method: Ambulatory Chair/bed transfer assist level: Touching or steadying assistance (Pt > 75%) Chair/bed transfer assistive device: Patent attorney     Max distance: 162ft  Assist level: Touching or steadying assistance (Pt > 75%)   Wheelchair   Type: Manual Max wheelchair distance: 6ft  Assist Level: Touching or steadying assistance (Pt > 75%)  Cognition Comprehension Comprehension assist level: Follows basic conversation/direction with extra time/assistive device  Expression Expression assist level: Expresses basic needs/ideas: With extra time/assistive device  Social Interaction Social Interaction assist level: Interacts appropriately 75 - 89% of the time - Needs redirection for appropriate language or to initiate interaction.  Problem Solving Problem solving assist level: Solves basic 50 - 74% of the time/requires cueing 25 - 49% of the time  Memory Memory assist level: Recognizes or recalls 50 - 74% of the time/requires cueing 25 - 49% of the time   Medical Problem List and Plan: 1. Debility secondary to Sepsis, lupuswith multiple medical issues.  -continue therapies  -continue  with therapy symptom permitting, if DBP <110 2. DVT Prophylaxis/Anticoagulation: Pharmaceutical: Lovenox 3. Pain Management: tylenol prn as well as increase in mobility should help with back pain.  4. Mood: LCSW to follow for evaluation and support.  5. Neuropsych: This patient is not fully capable of making decisions on hisown behalf. 6. Skin/Wound Care: routine  pressure relief measures.   protein supplement for low protein stores and to help with edema.  7. Fluids/Electrolytes/Nutrition:  Replace potassium  -encourage PO 8. CAP with SOB: nebulizers, IS/FV  -5/25 with ?LLL > RLL infiltrate/effusions. Most recent cxr with decreased effusions, ?atelectasis  -vanc/zosyn started---has been afebrile.   -zosyn dc/ed  -lasix held d/t incr Cr 9. Pleural effusion:   repeat CXR with improvement---continue to monitor. 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  prednisone 40 mg daily- . Decreased to 30mg  daily to help with BP To follow up with Dr. Nickola Major after dischage 11. Peripheral edema: Monitor weights daily.  12. Iron deficiency anemia: Improved with transfusion. Add iron supplement. :  13. Tachycardia: Monitor BP/HR bid --labetalol 14. Hyperthyroid: off methamazole currently.  15. ARI: lasix held  -vanc stopped earlier in the week  -reviewed appropriate fluid intake with patient, RN to encourage  -serial labs--Cr down to 1.54 16. Malignant HTN: improved control over last 18 hours  -continue labetalol 150mg  tid  -continue on cardizem 20mg  q8  -introduced losartan 50mg  (home medication per mother)  --clonidine prn  -potentially introduce HCTZ when Cr falls further  -decreased prednisone to 30mg      LOS (Days) 8 A FACE TO FACE EVALUATION WAS PERFORMED  Ranelle Oyster, MD 10/20/2016 8:34 AM

## 2016-10-21 ENCOUNTER — Inpatient Hospital Stay (HOSPITAL_COMMUNITY): Payer: Medicare Other | Admitting: Occupational Therapy

## 2016-10-21 MED ORDER — HYDROCHLOROTHIAZIDE 25 MG PO TABS
25.0000 mg | ORAL_TABLET | Freq: Every day | ORAL | Status: DC
  Administered 2016-10-21 – 2016-10-25 (×5): 25 mg via ORAL
  Filled 2016-10-21 (×5): qty 1

## 2016-10-21 NOTE — Progress Notes (Signed)
Erika Boyer PHYSICAL MEDICINE & REHABILITATION     PROGRESS NOTE    Subjective/Complaints: Sitting EOB. Feeling better. States she would like to be off low sodium diet  ROS: pt denies nausea, vomiting, diarrhea, cough, shortness of breath or chest pain    Objective: Vital Signs: Blood pressure (!) 139/102, pulse 73, temperature 98.4 F (36.9 C), temperature source Oral, resp. rate 16, height 5\' 5"  (1.651 m), weight 104.8 kg (231 lb), last menstrual period 10/08/2016, SpO2 95 %. No results found. No results for input(s): WBC, HGB, HCT, PLT in the last 72 hours.  Recent Labs  10/19/16 0902 10/20/16 0625  NA 140 135  K 3.9 3.8  CL 104 100*  GLUCOSE 92 77  BUN 13 12  CREATININE 1.61* 1.54*  CALCIUM 8.8* 8.7*   CBG (last 3)  No results for input(s): GLUCAP in the last 72 hours.  Wt Readings from Last 3 Encounters:  10/21/16 104.8 kg (231 lb)  10/12/16 122.5 kg (270 lb)  08/23/16 118.5 kg (261 lb 3.2 oz)    Physical Exam:  Constitutional: She is oriented to person, place, and time. She appears well-developedand well-nourished. No distress.  HENT:  Head: Normocephalicand atraumatic.  Mouth/Throat: Oropharynx is clear and moist.  Eyes: EOMI  Neck:  . Neck supple.  Cardiovascular:  RRR  Respiratory: cta b  GI: Soft. NT .  Musculoskeletal:  She exhibits no tenderness.  Tr to 1+  edema BLE Neurological: She is alertand oriented to person, place, and time. CN 2-12 normal. Good sitting balance Skin: Skin is warmand dry.  Marland Kitchen  Psychiatric:  cooperative. Fair insight and awareness   Assessment/Plan: 1. Functional and mobility deficits secondary to sepsis/debility which require 3+ hours per day of interdisciplinary therapy in a comprehensive inpatient rehab setting. Physiatrist is providing close team supervision and 24 hour management of active medical problems listed below. Physiatrist and rehab team continue to assess barriers to discharge/monitor patient progress  toward functional and medical goals.  Function:  Bathing Bathing position   Position: Shower  Bathing parts Body parts bathed by patient: Right arm, Left arm, Chest, Abdomen, Front perineal area, Buttocks, Right upper leg, Left upper leg, Right lower leg, Left lower leg Body parts bathed by helper: Right lower leg, Left lower leg, Back  Bathing assist Assist Level: Supervision or verbal cues      Upper Body Dressing/Undressing Upper body dressing   What is the patient wearing?: Pull over shirt/dress     Pull over shirt/dress - Perfomed by patient: Thread/unthread left sleeve, Thread/unthread right sleeve, Put head through opening, Pull shirt over trunk Pull over shirt/dress - Perfomed by helper: Thread/unthread right sleeve, Put head through opening, Pull shirt over trunk        Upper body assist Assist Level: Set up   Set up : To obtain clothing/put away  Lower Body Dressing/Undressing Lower body dressing   What is the patient wearing?: Underwear, Pants, Ted Hose, Non-skid slipper socks Underwear - Performed by patient: Thread/unthread right underwear leg, Thread/unthread left underwear leg, Pull underwear up/down Underwear - Performed by helper: Thread/unthread right underwear leg, Thread/unthread left underwear leg Pants- Performed by patient: Thread/unthread right pants leg, Thread/unthread left pants leg, Pull pants up/down Pants- Performed by helper: Thread/unthread right pants leg, Thread/unthread left pants leg Non-skid slipper socks- Performed by patient: Don/doff right sock, Don/doff left sock Non-skid slipper socks- Performed by helper: Don/doff right sock, Don/doff left sock  TED Hose - Performed by helper: Don/doff right TED hose, Don/doff left TED hose  Lower body assist Assist for lower body dressing: Touching or steadying assistance (Pt > 75%)      Toileting Toileting   Toileting steps completed by patient: Performs perineal hygiene, Adjust  clothing after toileting Toileting steps completed by helper: Adjust clothing prior to toileting, Performs perineal hygiene, Adjust clothing after toileting Toileting Assistive Devices: Grab bar or rail  Toileting assist Assist level: Touching or steadying assistance (Pt.75%)   Transfers Chair/bed transfer   Chair/bed transfer method: Ambulatory Chair/bed transfer assist level: Touching or steadying assistance (Pt > 75%) Chair/bed transfer assistive device: Patent attorneyWalker     Locomotion Ambulation     Max distance: 12 Assist level: Touching or steadying assistance (Pt > 75%)   Wheelchair   Type: Manual Max wheelchair distance: 4850ft  Assist Level: Touching or steadying assistance (Pt > 75%)  Cognition Comprehension Comprehension assist level: Follows basic conversation/direction with extra time/assistive device  Expression Expression assist level: Expresses basic needs/ideas: With extra time/assistive device  Social Interaction Social Interaction assist level: Interacts appropriately 75 - 89% of the time - Needs redirection for appropriate language or to initiate interaction.  Problem Solving Problem solving assist level: Solves basic 50 - 74% of the time/requires cueing 25 - 49% of the time  Memory Memory assist level: Recognizes or recalls 50 - 74% of the time/requires cueing 25 - 49% of the time   Medical Problem List and Plan: 1. Debility secondary to Sepsis, lupuswith multiple medical issues.  -continue therapies  -continue  with therapy symptom permitting, if DBP <110 2. DVT Prophylaxis/Anticoagulation: Pharmaceutical: Lovenox 3. Pain Management: tylenol prn as well as increase in mobility should help with back pain.  4. Mood: LCSW to follow for evaluation and support.  5. Neuropsych: This patient is not fully capable of making decisions on hisown behalf. 6. Skin/Wound Care: routine pressure relief measures.   protein supplement for low protein stores and to help  with edema.  7. Fluids/Electrolytes/Nutrition:  Replace potassium  -encourage PO 8. CAP with SOB: nebulizers, IS/FV  -5/25 with ?LLL > RLL infiltrate/effusions. Most recent cxr with decreased effusions, ?atelectasis  -vanc/zosyn started---has been afebrile.   -zosyn dc/ed  -lasix held d/t incr Cr 9. Pleural effusion:   repeat CXR with improvement---continue to monitor. 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  prednisone 40 mg daily- . Decreased to 30mg  daily to help with BP To follow up with Dr. Nickola MajorHawkes after dischage 11. Peripheral edema: Monitor weights daily.  12. Iron deficiency anemia: Improved with transfusion. Add iron supplement. :  13. Tachycardia: Monitor BP/HR bid --labetalol 14. Hyperthyroid: off methamazole currently.  15. ARI: lasix held  -vanc stopped earlier in the week   -serial labs--Cr down to 1.54 with today's labs pending 16. Malignant HTN: showing some improvement  -continue labetalol 150mg  tid  -continue on cardizem 20mg  q8  -introduced losartan 50mg  (home medication per mother)  --clonidine prn  -potentially introduce HCTZ today depending upon Cr  -decreased prednisone to 30mg  which will be effective today     LOS (Days) 9 A FACE TO FACE EVALUATION WAS PERFORMED  Ranelle OysterSWARTZ,Kalli Greenfield T, MD 10/21/2016 8:31 AM

## 2016-10-21 NOTE — Progress Notes (Signed)
Dr. Riley KillSwartz notified of blood pressure of 148/110. No new orders given.

## 2016-10-21 NOTE — Progress Notes (Signed)
Occupational Therapy Weekly Progress Note  Patient Details  Name: Erika Boyer MRN: [id number] Date of Birth: 1995/08/20  Beginning of progress report period: 10/13/16 End of progress report period: 10/21/16 Today's Date: 10/21/2016 OT Individual Time: 4720-7218 OT Individual Time Calculation (min): 41 min    Patient has met 2 of 4 short term goals.    Pt has made slow yet steady progress during this report period. Pt continues to be limited by decreased safety awareness, balance deficits and episodes of nausea. Therapy restrictions/cancelations relating to BP fluctuations also hindered functional gains. Overall pt requires Min A with functional transfers and for standing balance during dressing tasks. Pt has reached supervision level in shower with use of grab bars. Skilled OT will continue to focus on remediation of balance, and strength, endurance, while increasing safety awareness during functional tasks.   Patient continues to demonstrate the following deficits: muscle weakness, decreased cardiorespiratoy endurance, decreased awareness, decreased problem solving, decreased safety awareness and decreased memory and decreased standing balance and decreased postural control and therefore will continue to benefit from skilled OT intervention to enhance overall performance with BADL.  Patient progressing toward long term goals..  Continue plan of care.  OT Short Term Goals Week 2:  OT Short Term Goal 1 (Week 2): STGs=LTGs due to ELOS  Skilled Therapeutic Interventions/Progress Updates:    Tx focus on activity tolerance, functional ambulation with device, and balance.   Pt greeted EOB. BP 146/107. RN notified. Pt requesting shower. She ambulated to dresser with RW and Min A to retrieve clothing. Cues for safe walker placement/use. She then ambulated into bathroom, undressed on toilet, and proceeded to shower bench without AD and Min A. Pt bathing while on bench, laterally leaning for hygiene  mostly (she also does small squats for thoroughness). Pt then completed dressing at EOB with steady assist overall. Assist provided for donning Teds. At end of tx pt was left seated at EOB with all needs within reach. Pt verbalizing she will notify staff for transfer needs.   Therapy Documentation Precautions:  Precautions Precautions: Fall Restrictions Weight Bearing Restrictions: No  Pain: No c/o pain during tx    ADL:  :    See Function Navigator for Current Functional Status.   Therapy/Group: Individual Therapy  Isador Castille A Luella Gardenhire 10/21/2016, 12:50 PM

## 2016-10-22 ENCOUNTER — Ambulatory Visit: Payer: Medicare Other | Admitting: Endocrinology

## 2016-10-22 ENCOUNTER — Inpatient Hospital Stay (HOSPITAL_COMMUNITY): Payer: Medicare Other | Admitting: Speech Pathology

## 2016-10-22 ENCOUNTER — Inpatient Hospital Stay (HOSPITAL_COMMUNITY): Payer: Medicare Other | Admitting: Occupational Therapy

## 2016-10-22 ENCOUNTER — Ambulatory Visit: Payer: Self-pay | Admitting: Neurology

## 2016-10-22 ENCOUNTER — Inpatient Hospital Stay (HOSPITAL_COMMUNITY): Payer: Medicare Other | Admitting: Physical Therapy

## 2016-10-22 LAB — BASIC METABOLIC PANEL
ANION GAP: 8 (ref 5–15)
BUN: 12 mg/dL (ref 6–20)
CALCIUM: 9.1 mg/dL (ref 8.9–10.3)
CHLORIDE: 103 mmol/L (ref 101–111)
CO2: 26 mmol/L (ref 22–32)
Creatinine, Ser: 1.4 mg/dL — ABNORMAL HIGH (ref 0.44–1.00)
GFR calc Af Amer: >60 mL/min (ref >60)
GFR calc non Af Amer: 54 mL/min — ABNORMAL LOW (ref >60)
GLUCOSE: 82 mg/dL (ref 65–99)
Potassium: 3.9 mmol/L (ref 3.5–5.1)
Sodium: 137 mmol/L (ref 135–145)

## 2016-10-22 NOTE — Progress Notes (Signed)
Speech Language Pathology Daily Session Note  Patient Details  Name: Baxter FlatteryOmaure Rohleder MRN: [id number] Date of Birth: [birth year]  Today's Date: 10/22/2016 SLP Individual Time: 1005-1030 SLP Individual Time Calculation (min): 25 min  Short Term Goals: Week 2: SLP Short Term Goal 1 (Week 2): Patient will utilize working memory strategies for accurate completion of multi-step directions with Min assist verbal cues  SLP Short Term Goal 2 (Week 2): Patient will demonstrate ability to recall current medications, functions, and frequencies with Min assist verbal cues to utilize external aid SLP Short Term Goal 3 (Week 2): Patient will safely sequence transfers with recall for hand placement with walker during sit to stand and stand to sit with Supervision assist verbal cues  Skilled Therapeutic Interventions: Skilled treatment session focused on cognitive goals. SLP facilitated session by providing Min A verbal cues to recall sequence of transfers using the RW. Patient also required Mod-Max A multimodal cues for safety awareness and anticipatory awareness for d/c planning in regards to identifying tasks she will need assistance with at home. Patient left supine in bed with all needs within reach and alarm on. Continue with current plan of care.      Function:  Cognition Comprehension Comprehension assist level: Follows basic conversation/direction with extra time/assistive device  Expression   Expression assist level: Expresses complex ideas: With extra time/assistive device  Social Interaction Social Interaction assist level: Interacts appropriately 90% of the time - Needs monitoring or encouragement for participation or interaction.  Problem Solving Problem solving assist level: Solves basic 75 - 89% of the time/requires cueing 10 - 24% of the time  Memory Memory assist level: Recognizes or recalls 50 - 74% of the time/requires cueing 25 - 49% of the time    Pain Pain Assessment Pain Assessment:  No/denies pain  Therapy/Group: Individual Therapy  Yuri Flener 10/22/2016, 3:58 PM

## 2016-10-22 NOTE — Progress Notes (Signed)
[facility]     PROGRESS NOTE    Subjective/Complaints: Eating breakfast.  Feels pretty good today. Has good appetite. Denies nausea  ROS: pt denies nausea, vomiting, diarrhea, cough, shortness of breath or chest pain    Objective: Vital Signs: Blood pressure (!) 137/99, pulse (!) 108, temperature 98.4 F (36.9 C), temperature source Oral, resp. rate 17, height 5\' 5"  (1.651 m), weight 104.8 kg (231 lb), last menstrual period 10/08/2016, SpO2 99 %. No results found. No results for input(s): WBC, HGB, HCT, PLT in the last 72 hours.  Recent Labs  10/20/16 0625 10/22/16 0706  NA 135 137  K 3.8 3.9  CL 100* 103  GLUCOSE 77 82  BUN 12 12  CREATININE 1.54* 1.40*  CALCIUM 8.7* 9.1   CBG (last 3)  No results for input(s): GLUCAP in the last 72 hours.  Wt Readings from Last 3 Encounters:  10/21/16 104.8 kg (231 lb)  10/12/16 122.5 kg (270 lb)  08/23/16 118.5 kg (261 lb 3.2 oz)    Physical Exam:  Constitutional: She is oriented to person, place, and time. She appears well-developedand well-nourished. No distress.  HENT:  Head: Normocephalicand atraumatic.  Mouth/Throat: Oropharynx is clear and moist.  Eyes: EOMI  Neck:  . Neck supple.  Cardiovascular:  RRR  Respiratory: CTA B GI: Soft. NT .  Musculoskeletal:  She exhibits no tenderness.  Tr  edema BLE Neurological: She is alertand oriented to person, place, and time. CN 2-12 normal.  Skin: Skin is warmand dry.  Marland Kitchen  Psychiatric:  cooperative. Fair insight and awareness   Assessment/Plan: 1. Functional and mobility deficits secondary to sepsis/debility which require 3+ hours per day of interdisciplinary therapy in a comprehensive inpatient rehab setting. Physiatrist is providing close team supervision and 24 hour management of active medical problems listed below. Physiatrist and rehab team continue to assess barriers to discharge/monitor patient progress toward functional and  medical goals.  Function:  Bathing Bathing position   Position: Shower  Bathing parts Body parts bathed by patient: Right arm, Left arm, Chest, Abdomen, Front perineal area, Buttocks, Right upper leg, Left upper leg, Right lower leg, Left lower leg, Back Body parts bathed by helper: Right lower leg, Left lower leg, Back  Bathing assist Assist Level: Supervision or verbal cues      Upper Body Dressing/Undressing Upper body dressing   What is the patient wearing?: Pull over shirt/dress     Pull over shirt/dress - Perfomed by patient: Thread/unthread left sleeve, Thread/unthread right sleeve, Put head through opening, Pull shirt over trunk Pull over shirt/dress - Perfomed by helper: Thread/unthread right sleeve, Put head through opening, Pull shirt over trunk        Upper body assist Assist Level: Set up   Set up : To obtain clothing/put away  Lower Body Dressing/Undressing Lower body dressing   What is the patient wearing?: Underwear, Pants, Ted Hose, Non-skid slipper socks Underwear - Performed by patient: Thread/unthread right underwear leg, Thread/unthread left underwear leg, Pull underwear up/down Underwear - Performed by helper: Thread/unthread right underwear leg, Thread/unthread left underwear leg Pants- Performed by patient: Thread/unthread right pants leg, Thread/unthread left pants leg, Pull pants up/down Pants- Performed by helper: Thread/unthread right pants leg, Thread/unthread left pants leg Non-skid slipper socks- Performed by patient: Don/doff right sock, Don/doff left sock Non-skid slipper socks- Performed by helper: Don/doff right sock, Don/doff left sock               TED Hose -  Performed by helper: Don/doff right TED hose, Don/doff left TED hose  Lower body assist Assist for lower body dressing: Touching or steadying assistance (Pt > 75%)      Toileting Toileting   Toileting steps completed by patient: Performs perineal hygiene, Adjust clothing after  toileting Toileting steps completed by helper: Adjust clothing prior to toileting, Performs perineal hygiene, Adjust clothing after toileting Toileting Assistive Devices: Grab bar or rail  Toileting assist Assist level: Touching or steadying assistance (Pt.75%)   Transfers Chair/bed transfer   Chair/bed transfer method: Ambulatory Chair/bed transfer assist level: Touching or steadying assistance (Pt > 75%) Chair/bed transfer assistive device: Patent attorneyWalker     Locomotion Ambulation     Max distance: 12 Assist level: Touching or steadying assistance (Pt > 75%)   Wheelchair   Type: Manual Max wheelchair distance: 7550ft  Assist Level: Touching or steadying assistance (Pt > 75%)  Cognition Comprehension Comprehension assist level: Follows basic conversation/direction with extra time/assistive device  Expression Expression assist level: Expresses basic needs/ideas: With extra time/assistive device  Social Interaction Social Interaction assist level: Interacts appropriately 75 - 89% of the time - Needs redirection for appropriate language or to initiate interaction.  Problem Solving Problem solving assist level: Solves basic 50 - 74% of the time/requires cueing 25 - 49% of the time  Memory Memory assist level: Recognizes or recalls 50 - 74% of the time/requires cueing 25 - 49% of the time   Medical Problem List and Plan: 1. Debility secondary to Sepsis, lupuswith multiple medical issues.  -continue therapies  -continue  with therapy symptom permitting, if DBP <110 2. DVT Prophylaxis/Anticoagulation: Pharmaceutical: Lovenox 3. Pain Management: tylenol prn as well as increase in mobility should help with back pain.  4. Mood: LCSW to follow for evaluation and support.  5. Neuropsych: This patient is not fully capable of making decisions on hisown behalf. 6. Skin/Wound Care: routine pressure relief measures.   protein supplement for low protein stores and to help with edema.  7.  Fluids/Electrolytes/Nutrition:  Replace potassium  -encourage PO 8. CAP with SOB: nebulizers, IS/FV  -5/25 with ?LLL > RLL infiltrate/effusions. Most recent cxr with decreased effusions, ?atelectasis  -off abx   9. Pleural effusion:   repeat CXR with improvement---continue to monitor. 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  prednisone 40 mg daily-  Decreased to 30mg  daily to help with BP To follow up with Dr. Nickola MajorHawkes after dischage 11. Peripheral edema: Monitor weights daily.  12. Iron deficiency anemia: Improved with transfusion. Add iron supplement. :  13. Tachycardia: Monitor BP/HR bid --labetalol 14. Hyperthyroid: off methamazole currently. Thyroid studies within normal range 15. ARI: lasix held  -vanc stopped earlier in the week   -serial labs--Cr down to 1.4 today 16. Malignant HTN: showing gradual improvement  -continue labetalol 150mg  tid  -continue on cardizem 20mg  q8  -introduced losartan 50mg  (home medication per mother)  --clonidine prn  -re-introduced hctz yesterday---monitor Cr  -decreased prednisone to 30mg  also     LOS (Days) 10 A FACE TO FACE EVALUATION WAS PERFORMED  Ranelle OysterSWARTZ,Darrielle Pflieger T, MD 10/22/2016 8:37 AM

## 2016-10-22 NOTE — Progress Notes (Signed)
Speech Language Pathology Daily Session Note  Patient Details  Name: Erika Boyer MRN: [id number] Date of Birth: Nov 30, 1995  Today's Date: 10/22/2016 SLP Individual Time: 1405-1500 SLP Individual Time Calculation (min): 55 min  Short Term Goals: Week 2: SLP Short Term Goal 1 (Week 2): Patient will utilize working memory strategies for accurate completion of multi-step directions with Min assist verbal cues  SLP Short Term Goal 2 (Week 2): Patient will demonstrate ability to recall current medications, functions, and frequencies with Min assist verbal cues to utilize external aid SLP Short Term Goal 3 (Week 2): Patient will safely sequence transfers with recall for hand placement with walker during sit to stand and stand to sit with Supervision assist verbal cues  Skilled Therapeutic Interventions: Skilled treatment session focused on addressing cognition goals. SLP facilitated session by providing Min assist verbal cues for patient to accurately complete 2 step pen and paper tasks.  SLP encouraged patient to utilize self-talk/repetiotion to assist with recall.  SLP also facilitated session with discussion regarding current medications, patient recalled 6/10 with extra time and required Mod assist verbal cues to recall other 4.  Patient was also able to verbally recall sequence for transfers with Supervision level question cues.  Continue with current plan of care.    Function:   Cognition Comprehension Comprehension assist level: Follows basic conversation/direction with extra time/assistive device  Expression   Expression assist level: Expresses complex ideas: With extra time/assistive device  Social Interaction Social Interaction assist level: Interacts appropriately 90% of the time - Needs monitoring or encouragement for participation or interaction.  Problem Solving Problem solving assist level: Solves basic 75 - 89% of the time/requires cueing 10 - 24% of the time  Memory Memory  assist level: Recognizes or recalls 50 - 74% of the time/requires cueing 25 - 49% of the time    Pain Pain Assessment Pain Assessment: No/denies pain  Therapy/Group: Individual Therapy  Charlane FerrettiMelissa Pamelia Botto, M.A., CCC-SLP [phone]  Beauty Pless 10/22/2016, 2:56 PM

## 2016-10-22 NOTE — Progress Notes (Signed)
At 2006 BP=160/112, PRN clonidine given at 2018. At 2307 BP-145/88. Patient without complaint of. BLE edema, R>L. Alfredo MartinezMurray, Adilen Pavelko A

## 2016-10-22 NOTE — Progress Notes (Signed)
Occupational Therapy Session Note  Patient Details  Name: Erika Boyer MRN: [id number] Date of Birth: 06-30-1995  Today's Date: 10/22/2016 OT Individual Time: 0916-1000 OT Individual Time Calculation (min): 44 min    Short Term Goals: Week 1:  OT Short Term Goal 1 (Week 1): Pt will complete 3 grooming tasks in standing in order to increase functional activity tolerance OT Short Term Goal 2 (Week 1): Pt will dress LB with set-up/ dupervision using AE PRN OT Short Term Goal 3 (Week 1): Pt will complete 3/3 toileting tasks with supervision OT Short Term Goal 4 (Week 1): Pt will tolerate full bathing/dressing session with no more than 2 rest breaks Week 2:  OT Short Term Goal 1 (Week 2): STGs=LTGs due to ELOS  Skilled Therapeutic Interventions/Progress Updates:    Upon entering the room, pt seated on EOB awaiting therapist arrival. BP taken in R UE with results of 128/91. Pt ambulating with RW to dresser to obtain clothing items with min A secondary to pt's safety awareness with use of RW. When pt comes in contact with obstacles pt picks up RW, causing her to become unstable, to change directions instead of correcting direction while maintaining balance and RW on floor. Pt given mod cues throughout session but continues in this way. Pt seated on TTB for bathing at shower level with overall supervision. Pt ambulating to sit on EOB for dressing tasks. Pt needing seated rest break secondary to fatigue. Pt standing at sink with RW and supervision for grooming tasks. Pt seated on EOB awaiting next therapy session with call bell and all needed items within reach upon exiting the room.   Therapy Documentation Precautions:  Precautions Precautions: Fall Restrictions Weight Bearing Restrictions: No  See Function Navigator for Current Functional Status.   Therapy/Group: Individual Therapy  Alen BleacherBradsher, Elbert Polyakov P 10/22/2016, 1:07 PM

## 2016-10-22 NOTE — Progress Notes (Signed)
Physical Therapy Session Note  Patient Details  Name: Erika Boyer MRN: [id number] Date of Birth: [birth year]  Today's Date: 10/22/2016 PT Individual Time: 1306-1401 PT Individual Time Calculation (min): 55 min   Short Term Goals: Week 1:  PT Short Term Goal 1 (Week 1): Pt will negotiate 12 steps with 1 rail and min assist. PT Short Term Goal 1 - Progress (Week 1): Progressing toward goal PT Short Term Goal 2 (Week 1): Pt will ambulate 75 ft with LRAD & supervision. PT Short Term Goal 2 - Progress (Week 1): Progressing toward goal  Skilled Therapeutic Interventions/Progress Updates:  Pt received in room & agreeable to tx, RN administering BP medication. Pt initially reporting back pain & RN aware, but later in gym pt reporting having a HA all day (6/10). Therapist requested pain medication from RN who reports pt has not reported HA on this date.  PA (Pam) cleared pt for seated exercises since diastolic BP >110. Transported pt to gym via w/c total assist and pt engaged in BLE strengthening exercises (long arc quads and hip flexion) with 5# weights. Pt's BP had decreased therefore stair negotiation initiated. Pt negotiated 12 steps with L rail and min assist; pt requires cuing to hold L rail with BUE to increase stability. Pt distracted during task and with very poor safety awareness overall, but pt reports she does not feel she would fall without therapist's assistance. Pt propelled w/c gym>apartment>nurses station with frequent cuing for obstacle avoidance on L/R with task focusing on endurance training. In apartment pt performed bed mobility with mod I. Throughout session pt performed bed<>w/c stand pivot transfers with close supervision and cuing for safety awareness & technique. At end of session pt left sitting in w/c, set up with meal tray, all needs within reach, and awaiting arrival of SLP.  Therapy Documentation Precautions:    Precautions Precautions: Fall Restrictions Weight Bearing  Restrictions: No  Vital Signs: Sitting EOB at beginning of session: BP = 150/118 mmHg (LUE dinamap), HR = 97 bpm 1:36 PM: After performing BLE exercises: BP = 149/97 mmHg, HR = 93 bpm 1:45 PM: After stair negotiation: BP = 166/107 mmHg, HR = 90 bpm 2:00 PM: After propelling w/c from apartment to nurses station: BP = 147/112 mmHg, HR = 95 bpm   See Function Navigator for Current Functional Status.   Therapy/Group: Individual Therapy  Sandi MariscalVictoria M Anita Mcadory 10/22/2016, 4:09 PM

## 2016-10-23 ENCOUNTER — Inpatient Hospital Stay (HOSPITAL_COMMUNITY): Payer: Medicare Other | Admitting: Speech Pathology

## 2016-10-23 ENCOUNTER — Inpatient Hospital Stay (HOSPITAL_COMMUNITY): Payer: Medicare Other | Admitting: Physical Therapy

## 2016-10-23 ENCOUNTER — Inpatient Hospital Stay (HOSPITAL_COMMUNITY): Payer: Medicare Other | Admitting: Occupational Therapy

## 2016-10-23 MED ORDER — PREDNISONE 20 MG PO TABS
20.0000 mg | ORAL_TABLET | Freq: Every day | ORAL | Status: DC
Start: 2016-10-23 — End: 2016-10-25
  Administered 2016-10-23 – 2016-10-25 (×3): 20 mg via ORAL
  Filled 2016-10-23 (×2): qty 1

## 2016-10-23 NOTE — Plan of Care (Signed)
Problem: RH Balance Goal: LTG: Patient will maintain dynamic sitting balance (OT) LTG:  Patient will maintain dynamic sitting balance with assistance during activities of daily living (OT)  Downgraded secondary to safety Goal: LTG Patient will maintain dynamic standing with ADLs (OT) LTG:  Patient will maintain dynamic standing balance with assist during activities of daily living (OT)   Downgraded secondary to safety  Problem: RH Grooming Goal: LTG Patient will perform grooming w/assist,cues/equip (OT) LTG: Patient will perform grooming with assist, with/without cues using equipment (OT)  Downgraded secondary to safety  Problem: RH Bathing Goal: LTG Patient will bathe with assist, cues/equipment (OT) LTG: Patient will bathe specified number of body parts with assist with/without cues using equipment (position)  (OT)  Downgraded secondary to safety  Problem: RH Dressing Goal: LTG Patient will perform upper body dressing (OT) LTG Patient will perform upper body dressing with assist, with/without cues (OT).  Downgraded secondary to safety  Problem: RH Simple Meal Prep Goal: LTG Patient will perform simple meal prep w/assist (OT) LTG: Patient will perform simple meal prep with assistance, with/without cues (OT).  Outcome: Not Applicable Date Met: 90/22/84 Pt declines to work towards goal at this time  Problem: RH Laundry Goal: LTG Patient will perform laundry w/assist, cues (OT) LTG: Patient will perform laundry with assistance, with/without cues (OT).  Outcome: Not Applicable Date Met: 06/98/61 Pt declines to work towards goal at this time

## 2016-10-23 NOTE — Progress Notes (Signed)
Patient's BP 165/109 HR 85 at 2200. Clonidine 0.1mg  given prn, rechecked BP at 0014 it was 140/82 HR 80. Denies SOB, HA or lightheadedness. Will monitor.

## 2016-10-23 NOTE — Plan of Care (Signed)
Problem: RH Balance Goal: LTG Patient will maintain dynamic standing balance (PT) LTG:  Patient will maintain dynamic standing balance with assistance during mobility activities (PT)  With LRAD; downgrade 2/2 slow progress  Problem: RH Furniture Transfers Goal: LTG Patient will perform furniture transfers w/assist (OT/PT LTG: Patient will perform furniture transfers  with assistance (OT/PT).  Downgrade 2/2 slow progress  Problem: RH Ambulation Goal: LTG Patient will ambulate in controlled environment (PT) LTG: Patient will ambulate in a controlled environment, # of feet with assistance (PT).  Downgrade 2/2 slow progress Goal: LTG Patient will ambulate in home environment (PT) LTG: Patient will ambulate in home environment, # of feet with assistance (PT).  50 ft with LRAD; downgrade 2/2 slow progress  Problem: RH Stairs Goal: LTG Patient will ambulate up and down stairs w/assist (PT) LTG: Patient will ambulate up and down # of stairs with assistance (PT)  12 steps with 1 rail for access to bed/bath on 2nd level; downgrade 2/2 slow progress

## 2016-10-23 NOTE — Progress Notes (Signed)
Physical Therapy Session Note  Patient Details  Name: Erika Boyer MRN: [id number] Date of Birth: 1996/04/27  Today's Date: 10/23/2016 PT Individual Time: 1305-1430 PT Individual Time Calculation (min): 85 min   Short Term Goals: Week 2:  PT Short Term Goal 1 (Week 2): STG = LTG due to anticipated d/c date.   Skilled Therapeutic Interventions/Progress Updates:  Pt received in room & agreeable to tx. Recreational therapist present for majority of session. Session focused on pt education, transfers, functional mobility, gait/dynamic balance, and Berg Balance Testing. Pt ambulated without AD and mod assist 2/2 anterior lean and multiple LOB. Pt emotional after task reporting "I thought it'd be better"; therapist and recreational therapist provided encouragement. Pt completed tub transfer with bench and min assist for balance. Pt reporting she feels like she needs the tub bench for use at home. Pt completed Berg Balance Test & scored 19/56; educated pt on interpretation of score & current fall risk. Patient demonstrates increased fall risk as noted by score of 19/56 on Berg Balance Scale.  (<36= high risk for falls, close to 100%; 37-45 significant >80%; 46-51 moderate >50%; 52-55 lower >25%). Pt very emotional and crying during Berg Balance Testing 2/2 impaired balance & lack of progress, therapist continued to provide support. Pt engaged in Wii games while standing without UE support and min assist overall for balance; pt with multiple losses of balance posteriorly with very poor righting reactions. Transported pt outside and educated pt on d/c planning, safety recommendations, and current fall risk. Pt reports "if I fall it won't hurt" even after therapist educating her on risks. At end of session pt left in bed with alarm set & all needs within reach .   Therapy Documentation Precautions:  Precautions Precautions: Fall Restrictions Weight Bearing Restrictions: No   Vital Signs: 1310: BP  131/95 mmHg (RUE), HR = 90 bpm  Pain: Pt denied c/o pain at rest but did require occasional rest breaks 2/2 knee pain after standing.   Balance: Standardized Balance Assessment Standardized Balance Assessment: Berg Balance Test Berg Balance Test Sit to Stand: Able to stand without using hands and stabilize independently Standing Unsupported: Able to stand 2 minutes with supervision Sitting with Back Unsupported but Feet Supported on Floor or Stool: Able to sit safely and securely 2 minutes Stand to Sit: Sits safely with minimal use of hands (requires cuing to control descent) Transfers: Needs two people to assist of supervise to be safe (pt with LOB requiring 2 person assist to prevent fall) Standing Unsupported with Eyes Closed: Able to stand 3 seconds Standing Ubsupported with Feet Together: Needs help to attain position and unable to hold for 15 seconds From Standing, Reach Forward with Outstretched Arm: Reaches forward but needs supervision (reaches forward 10 inches) From Standing Position, Pick up Object from Floor: Unable to try/needs assist to keep balance (steady assist for balance) From Standing Position, Turn to Look Behind Over each Shoulder: Needs supervision when turning Turn 360 Degrees: Needs assistance while turning Standing Unsupported, Alternately Place Feet on Step/Stool: Needs assistance to keep from falling or unable to try Standing Unsupported, One Foot in Front: Loses balance while stepping or standing Standing on One Leg: Unable to try or needs assist to prevent fall Total Score: 19   See Function Navigator for Current Functional Status.   Therapy/Group: Individual Therapy  Sandi MariscalVictoria M Miller 10/23/2016, 4:21 PM

## 2016-10-23 NOTE — Progress Notes (Signed)
[facility] PHYSICAL MEDICINE & REHABILITATION     PROGRESS NOTE    Subjective/Complaints: Lying in bed talking on phone. Feeling better. Good appetite  ROS: pt denies nausea, vomiting, diarrhea, cough, shortness of breath or chest pain     Objective: Vital Signs: Blood pressure (!) 133/109, pulse 85, temperature 98.6 F (37 C), temperature source Oral, resp. rate 18, height 5\' 5"  (1.651 m), weight 104.9 kg (231 lb 3.9 oz), last menstrual period 10/08/2016, SpO2 98 %. No results found. No results for input(s): WBC, HGB, HCT, PLT in the last 72 hours.  Recent Labs  10/22/16 0706  NA 137  K 3.9  CL 103  GLUCOSE 82  BUN 12  CREATININE 1.40*  CALCIUM 9.1   CBG (last 3)  No results for input(s): GLUCAP in the last 72 hours.  Wt Readings from Last 3 Encounters:  10/23/16 104.9 kg (231 lb 3.9 oz)  10/12/16 122.5 kg (270 lb)  08/23/16 118.5 kg (261 lb 3.2 oz)    Physical Exam:  Constitutional: She is oriented to person, place, and time. She appears well-developedand well-nourished. No distress.  HENT:  Head: Normocephalicand atraumatic.  Mouth/Throat: Oropharynx is clear and moist.  Eyes: EOMI  Neck:  . Neck supple.  Cardiovascular: RRR Respiratory: cta b GI: Soft. NT .  Musculoskeletal:  She exhibits no tenderness.  Tr to no  edema BLE Neurological: She is alertand oriented to person, place, and time. CN 2-12 normal.  Skin: Skin is warmand dry.  Marland Kitchen.  Psychiatric:  cooperative. Fair insight and awareness   Assessment/Plan: 1. Functional and mobility deficits secondary to sepsis/debility which require 3+ hours per day of interdisciplinary therapy in a comprehensive inpatient rehab setting. Physiatrist is providing close team supervision and 24 hour management of active medical problems listed below. Physiatrist and rehab team continue to assess barriers to discharge/monitor patient progress toward functional and medical goals.  Function:  Bathing Bathing  position   Position: Shower  Bathing parts Body parts bathed by patient: Right arm, Left arm, Chest, Abdomen, Front perineal area, Buttocks, Right upper leg, Left upper leg, Right lower leg, Left lower leg, Back Body parts bathed by helper: Right lower leg, Left lower leg, Back  Bathing assist Assist Level: Supervision or verbal cues      Upper Body Dressing/Undressing Upper body dressing   What is the patient wearing?: Pull over shirt/dress     Pull over shirt/dress - Perfomed by patient: Thread/unthread left sleeve, Thread/unthread right sleeve, Put head through opening, Pull shirt over trunk Pull over shirt/dress - Perfomed by helper: Thread/unthread right sleeve, Put head through opening, Pull shirt over trunk        Upper body assist Assist Level: Supervision or verbal cues   Set up : To obtain clothing/put away  Lower Body Dressing/Undressing Lower body dressing   What is the patient wearing?: Underwear, Pants, Non-skid slipper socks Underwear - Performed by patient: Thread/unthread right underwear leg, Thread/unthread left underwear leg, Pull underwear up/down Underwear - Performed by helper: Thread/unthread right underwear leg, Thread/unthread left underwear leg Pants- Performed by patient: Thread/unthread right pants leg, Thread/unthread left pants leg, Pull pants up/down Pants- Performed by helper: Thread/unthread right pants leg, Thread/unthread left pants leg Non-skid slipper socks- Performed by patient: Don/doff right sock, Don/doff left sock Non-skid slipper socks- Performed by helper: Don/doff right sock, Don/doff left sock               TED Hose - Performed by helper: Don/doff right TED  hose, Don/doff left TED hose  Lower body assist Assist for lower body dressing: Touching or steadying assistance (Pt > 75%)      Toileting Toileting   Toileting steps completed by patient: Adjust clothing prior to toileting, Performs perineal hygiene, Adjust clothing after  toileting Toileting steps completed by helper: Adjust clothing prior to toileting, Performs perineal hygiene, Adjust clothing after toileting Toileting Assistive Devices: Grab bar or rail  Toileting assist Assist level: Touching or steadying assistance (Pt.75%)   Transfers Chair/bed transfer   Chair/bed transfer method: Stand pivot Chair/bed transfer assist level: Supervision or verbal cues Chair/bed transfer assistive device: Armrests     Locomotion Ambulation     Max distance: 15 Assist level: Touching or steadying assistance (Pt > 75%)   Wheelchair   Type: Manual Max wheelchair distance: 150 ft Assist Level: Supervision or verbal cues  Cognition Comprehension Comprehension assist level: Follows basic conversation/direction with extra time/assistive device  Expression Expression assist level: Expresses complex ideas: With extra time/assistive device  Social Interaction Social Interaction assist level: Interacts appropriately 90% of the time - Needs monitoring or encouragement for participation or interaction.  Problem Solving Problem solving assist level: Solves basic 75 - 89% of the time/requires cueing 10 - 24% of the time  Memory Memory assist level: Recognizes or recalls 50 - 74% of the time/requires cueing 25 - 49% of the time   Medical Problem List and Plan: 1. Debility secondary to Sepsis, lupuswith multiple medical issues.  -team conference today  -continue  with therapy symptom permitting, if DBP <110 2. DVT Prophylaxis/Anticoagulation: Pharmaceutical: Lovenox 3. Pain Management: tylenol prn as well as increase in mobility should help with back pain.  4. Mood: LCSW to follow for evaluation and support.  5. Neuropsych: This patient is not fully capable of making decisions on hisown behalf. 6. Skin/Wound Care: routine pressure relief measures.   protein supplement for low protein stores and to help with edema.  7. Fluids/Electrolytes/Nutrition:  Replace  potassium  -encourage PO 8. CAP with SOB: nebulizers, IS/FV  -5/25 with ?LLL > RLL infiltrate/effusions. Most recent cxr with decreased effusions, ?atelectasis  -off abx   9. Pleural effusion:   repeat CXR with improvement---continue to monitor. 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  prednisone 40 mg daily-  Decreased to 30mg  daily to help with BP To follow up with Dr. Nickola Major after dischage 11. Peripheral edema: Monitor weights daily.  12. Iron deficiency anemia: Improved with transfusion. Add iron supplement. :  13. Tachycardia: Monitor BP/HR bid --labetalol 14. Hyperthyroid: off methamazole currently. Thyroid studies within normal range 15. ARI: lasix held  -?vanc effect   -serial labs--Cr down to 1.4   16. Malignant HTN: showing gradual improvement  -continue labetalol 150mg  tid  -continue on cardizem 20mg  q8  -introduced losartan 50mg  (home medication per mother)--push further if needed  --clonidine prn  -re-introduced hctz yesterday---monitor Cr--check tomorrow  -decreased prednisone further to 20mg  daily     LOS (Days) 11 A FACE TO FACE EVALUATION WAS PERFORMED  Faith Rogue T, MD 10/23/2016 8:40 AM

## 2016-10-23 NOTE — Progress Notes (Signed)
Speech Language Pathology Daily Session Note  Patient Details  Name: Erika Boyer MRN: [id number] Date of Birth: [birth year]  Today's Date: 10/23/2016 SLP Individual Time: 0935-1030 SLP Individual Time Calculation (min): 55 min  Short Term Goals: Week 2: SLP Short Term Goal 1 (Week 2): Patient will utilize working memory strategies for accurate completion of multi-step directions with Min assist verbal cues  SLP Short Term Goal 2 (Week 2): Patient will demonstrate ability to recall current medications, functions, and frequencies with Min assist verbal cues to utilize external aid SLP Short Term Goal 3 (Week 2): Patient will safely sequence transfers with recall for hand placement with walker during sit to stand and stand to sit with Supervision assist verbal cues  Skilled Therapeutic Interventions: Skilled treatment session focused on cognitive goals. SLP facilitated session by providing Mod-Max A verbal cues for anticipatory awareness in regards to d/c planning and need to utilize RW at home as well as have 24 hour supervision from family to maximize safety due to unsafe behaviors. Patient continues to report she does not see the "need" to use the RW or have supervision at home. Patient requested to use the bathroom and ambulated the short distance with the RW to the bathroom from her bed with running into ~4 obstacles with Max verbal cues needed for safety. Patient also participated in a novel card task with Mod I for recall of procedures and supervision verbal cues for problem solving. Patient left EOB with alarm on and all needs within reach. Continue with current plan of care.      Function:  Cognition Comprehension Comprehension assist level: Follows basic conversation/direction with extra time/assistive device  Expression   Expression assist level: Expresses complex ideas: With extra time/assistive device  Social Interaction Social Interaction assist level: Interacts appropriately 90%  of the time - Needs monitoring or encouragement for participation or interaction.  Problem Solving Problem solving assist level: Solves basic 75 - 89% of the time/requires cueing 10 - 24% of the time  Memory Memory assist level: Recognizes or recalls 50 - 74% of the time/requires cueing 25 - 49% of the time    Pain No/Denies Pain   Therapy/Group: Individual Therapy  Erika Boyer 10/23/2016, 11:14 AM

## 2016-10-23 NOTE — Progress Notes (Signed)
Occupational Therapy Session Note  Patient Details  Name: Baxter FlatteryOmaure Senkbeil MRN: [id number] Date of Birth: Nov 16, 1995  Today's Date: 10/23/2016 OT Individual Time: 0703-0800 OT Individual Time Calculation (min): 57 min    Short Term Goals: Week 2:  OT Short Term Goal 1 (Week 2): STGs=LTGs due to ELOS  Skilled Therapeutic Interventions/Progress Updates:    Upon entering the room, pt seated on EOB awaiting therapist with no c/o pain this session. Pt received BP medication prior to OT arrival and BP while seated on EOB is 144/104. Pt reports no symptoms at this time. She declined shower. Pt ambulating with RW to dresser to obtain clothing items with steady assistance and min verbal cues for safety awareness with RW advancement. Pt placing items on bed and ambulating to bathroom for toileting needs. Pt performed clothing management and hygiene with overall steady assistance for safety. Pt returning to bed to don clothing items. Pt placing self in circle sitting in order to don B LE TED hose with increased time. Pt standing at sink for grooming tasks with supervision for balance. Pt returning to sit on EOB for breakfast while OT discussing equipment needs and safety within home environment. Pt showing poor insight and judgement but initially declining shower equipment for safety as well as stating, " I feel before so I will be okay if I fall again." OT continuing to education pt as she ate food. Call bell and all needed items within reach upon exiting the room.  Therapy Documentation Precautions:  Precautions Precautions: Fall Restrictions Weight Bearing Restrictions: No    See Function Navigator for Current Functional Status.   Therapy/Group: Individual Therapy  Alen BleacherBradsher, Constantino Starace P 10/23/2016, 12:52 PM

## 2016-10-23 NOTE — Progress Notes (Signed)
Recreational Therapy Session Note  Patient Details  Name: Erika Boyer MRN: [id number] Date of Birth: [birth year] Today's Date: 10/23/2016  Pain: no c/o Skilled Therapeutic Interventions/Progress Updates: Session focused on activity tolerance, standing tolerance, dynamic standing balance, safety awareness, discharge planning during co-treat with PT.  Therapeutic activities included playing Wii Carnival Games standing without UE support.  Pt tearful during session when balance challenged and pt experienced LOB.  Pt focused on wanting to go home.  Emotional support provided.  Therapy/Group: Co-Treatment   Wyndham Santilli 10/23/2016, 4:04 PM

## 2016-10-24 ENCOUNTER — Ambulatory Visit (INDEPENDENT_AMBULATORY_CARE_PROVIDER_SITE_OTHER): Payer: Medicare Other | Admitting: Physician Assistant

## 2016-10-24 ENCOUNTER — Inpatient Hospital Stay (HOSPITAL_COMMUNITY): Payer: Medicare Other | Admitting: Physical Therapy

## 2016-10-24 ENCOUNTER — Inpatient Hospital Stay (HOSPITAL_COMMUNITY): Payer: Medicare Other | Admitting: Speech Pathology

## 2016-10-24 ENCOUNTER — Inpatient Hospital Stay (HOSPITAL_COMMUNITY): Payer: Medicare Other | Admitting: Occupational Therapy

## 2016-10-24 LAB — BASIC METABOLIC PANEL
ANION GAP: 9 (ref 5–15)
BUN: 14 mg/dL (ref 6–20)
CALCIUM: 8.7 mg/dL — AB (ref 8.9–10.3)
CO2: 25 mmol/L (ref 22–32)
Chloride: 103 mmol/L (ref 101–111)
Creatinine, Ser: 1.38 mg/dL — ABNORMAL HIGH (ref 0.44–1.00)
GFR calc non Af Amer: 55 mL/min — ABNORMAL LOW (ref >60)
Glucose, Bld: 91 mg/dL (ref 65–99)
POTASSIUM: 4.1 mmol/L (ref 3.5–5.1)
SODIUM: 137 mmol/L (ref 135–145)

## 2016-10-24 LAB — CBC
HEMATOCRIT: 30.5 % — AB (ref 36.0–46.0)
HEMOGLOBIN: 9.5 g/dL — AB (ref 12.0–15.0)
MCH: 23.8 pg — ABNORMAL LOW (ref 26.0–34.0)
MCHC: 31.1 g/dL (ref 30.0–36.0)
MCV: 76.4 fL — ABNORMAL LOW (ref 78.0–100.0)
Platelets: 288 10*3/uL (ref 150–400)
RBC: 3.99 MIL/uL (ref 3.87–5.11)
RDW: 21.2 % — ABNORMAL HIGH (ref 11.5–15.5)
WBC: 6 10*3/uL (ref 4.0–10.5)

## 2016-10-24 NOTE — Progress Notes (Signed)
Social Work Patient ID: Erika Boyer, female   DOB: 09/18/1995, 20 y.o.   MRN: [id number]   Met with pt and mother today to review team conference and discuss concerns about pt's safety at home.  Explained to both that tx team strongly recommends 24/7 supervision/ assistance for pt at home due to high fall risk and poor safety awareness.  Mother immediately begins to give description of pt's sedentary behavior at home and notes she has access to a cell phone if she were to fall.  Mother even notes that pt had falls at home PTA as a reason to discount the team's concerns.  Again, explained that team recommends that pt have 24/7 assistance at home or she places herself at risk of fall with injuries or worse.  Offered option of SNF if they cannot cover 24/7 - mother asks patient if she "wants to go to a nursing home?"  Pt declines and mother states, "We're just going to go home."  Repeated risks to both a couple of more times but they continued to deny any concerns themselves and state their wish to d/c home.  Therapies were able to complete family education and continue to stress safety concerns but pt and mother remained set on d/c home.   I have made a referral for a PCS aide in the home and mother noted that she had already been contacted to schedule a RN assessment visit this Friday.  I stressed to her that it could still take a couple of weeks before aide service would begin - she understands.  Mother had noted that, in [place], she was able to get these same services and that mother would schedule her work around aide times.  I encouraged her to consider taking time off until this aide service begins but she does not respond to me.  I have also set up HHRN, PT,  OT, ST and SW visits.  Referred pt's case to [facility] for community case management and still await word as to whether or not they can open a case for pt.  Pt for d/c home tomorrow.  , , LCSW  

## 2016-10-24 NOTE — Progress Notes (Addendum)
PHYSICAL MEDICINE & REHABILITATION     PROGRESS NOTE    Subjective/Complaints: Sitting at eob eating breafkast. States that she feels "dizzy"  ROS: pt denies nausea, vomiting, diarrhea, cough, shortness of breath or chest pain    Objective: Vital Signs: Blood pressure 137/78, pulse 93, temperature 99.2 F (37.3 C), temperature source Oral, resp. rate 17, height 5\' 5"  (1.651 m), weight 104.9 kg (231 lb 3.9 oz), last menstrual period 10/08/2016, SpO2 96 %. No results found.  Recent Labs  10/24/16 0539  WBC 6.0  HGB 9.5*  HCT 30.5*  PLT 288    Recent Labs  10/22/16 0706 10/24/16 0539  NA 137 137  K 3.9 4.1  CL 103 103  GLUCOSE 82 91  BUN 12 14  CREATININE 1.40* 1.38*  CALCIUM 9.1 8.7*   CBG (last 3)  No results for input(s): GLUCAP in the last 72 hours.  Wt Readings from Last 3 Encounters:  10/23/16 104.9 kg (231 lb 3.9 oz)  10/12/16 122.5 kg (270 lb)  08/23/16 118.5 kg (261 lb 3.2 oz)    Physical Exam:  Constitutional: She is oriented to person, place, and time. She appears well-developedand well-nourished. No distress.  HENT:  Head: Normocephalicand atraumatic.  Mouth/Throat: Oropharynx is clear and moist.  Eyes: EOMI  Neck:  . Neck supple.  Cardiovascular: RRR without murmur. No jvd Respiratory: both lungs clear to auscultation GI: Soft. NT .  Musculoskeletal:  She exhibits no tenderness.  No edema BLE Neurological: She is alertand oriented to person, place, and time. CN 2-12 normal.  Skin: Skin is warmand dry.  Marland Kitchen.  Psychiatric:  cooperative. Fair insight and awareness, child-like  Assessment/Plan: 1. Functional and mobility deficits secondary to sepsis/debility which require 3+ hours per day of interdisciplinary therapy in a comprehensive inpatient rehab setting. Physiatrist is providing close team supervision and 24 hour management of active medical problems listed below. Physiatrist and rehab team continue to assess barriers to  discharge/monitor patient progress toward functional and medical goals.  Function:  Bathing Bathing position   Position: Shower  Bathing parts Body parts bathed by patient: Right arm, Left arm, Chest, Abdomen, Front perineal area, Buttocks, Right upper leg, Left upper leg, Right lower leg, Left lower leg, Back Body parts bathed by helper: Right lower leg, Left lower leg, Back  Bathing assist Assist Level: Supervision or verbal cues      Upper Body Dressing/Undressing Upper body dressing   What is the patient wearing?: Pull over shirt/dress     Pull over shirt/dress - Perfomed by patient: Thread/unthread left sleeve, Thread/unthread right sleeve, Put head through opening, Pull shirt over trunk Pull over shirt/dress - Perfomed by helper: Thread/unthread right sleeve, Put head through opening, Pull shirt over trunk        Upper body assist Assist Level: Supervision or verbal cues   Set up : To obtain clothing/put away  Lower Body Dressing/Undressing Lower body dressing   What is the patient wearing?: Underwear, Pants, American Family Insuranceed Hose, Socks Underwear - Performed by patient: Thread/unthread right underwear leg, Thread/unthread left underwear leg, Pull underwear up/down Underwear - Performed by helper: Thread/unthread right underwear leg, Thread/unthread left underwear leg Pants- Performed by patient: Thread/unthread right pants leg, Thread/unthread left pants leg, Pull pants up/down Pants- Performed by helper: Thread/unthread right pants leg, Thread/unthread left pants leg Non-skid slipper socks- Performed by patient: Don/doff right sock, Don/doff left sock Non-skid slipper socks- Performed by helper: Don/doff right sock, Don/doff left sock  TED Hose - Performed by patient: Don/doff right TED hose, Don/doff left TED hose TED Hose - Performed by helper: Don/doff right TED hose, Don/doff left TED hose  Lower body assist Assist for lower body dressing: Supervision or verbal  cues      Toileting Toileting   Toileting steps completed by patient: Adjust clothing prior to toileting, Performs perineal hygiene, Adjust clothing after toileting Toileting steps completed by helper: Adjust clothing prior to toileting, Performs perineal hygiene, Adjust clothing after toileting Toileting Assistive Devices: Grab bar or rail  Toileting assist Assist level: More than reasonable time, Supervision or verbal cues, Touching or steadying assistance (Pt.75%)   Transfers Chair/bed transfer   Chair/bed transfer method: Stand pivot Chair/bed transfer assist level: Supervision or verbal cues Chair/bed transfer assistive device: Armrests     Locomotion Ambulation     Max distance: 50 ft Assist level: Moderate assist (Pt 50 - 74%)   Wheelchair   Type: Manual Max wheelchair distance: 150 ft Assist Level: Supervision or verbal cues  Cognition Comprehension Comprehension assist level: Follows basic conversation/direction with extra time/assistive device  Expression Expression assist level: Expresses complex ideas: With extra time/assistive device  Social Interaction Social Interaction assist level: Interacts appropriately 90% of the time - Needs monitoring or encouragement for participation or interaction.  Problem Solving Problem solving assist level: Solves basic 75 - 89% of the time/requires cueing 10 - 24% of the time  Memory Memory assist level: Recognizes or recalls 50 - 74% of the time/requires cueing 25 - 49% of the time   Medical Problem List and Plan: 1. Debility secondary to Sepsis, lupuswith multiple medical issues.  -mother in today for ed. If no supervision at home will need to consider SNF  -continue  with therapy symptom permitting, if DBP <110 2. DVT Prophylaxis/Anticoagulation: Pharmaceutical: Lovenox 3. Pain Management: tylenol prn as well as increase in mobility should help with back pain.  4. Mood: LCSW to follow for evaluation and support.   5. Neuropsych: This patient is not fully capable of making decisions on hisown behalf. 6. Skin/Wound Care: routine pressure relief measures.   protein supplement for low protein stores and to help with edema.  7. Fluids/Electrolytes/Nutrition:  Replace potassium  -encourage PO 8. CAP with SOB: nebulizers, IS/FV  -5/25 with ?LLL > RLL infiltrate/effusions. Most recent cxr with decreased effusions, ?atelectasis  -off abx   9. Pleural effusion:   repeat CXR with improvement---continue to monitor. 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  prednisone 40 mg daily-  Decreased to 20mg  daily to help with BP To follow up with Dr. Nickola Major after dischage 11. Peripheral edema: Monitor weights daily.  12. Iron deficiency anemia: Improved with transfusion. Add iron supplement. :  13. Tachycardia: Monitor BP/HR bid --labetalol 14. Hyperthyroid: off methamazole currently. Thyroid studies within normal range 15. ARI: lasix held  -likely vanc effect too   -serial labs--Cr down to 1.38   16. Malignant HTN: showing improvement. Hopefully won't over correct  -continue labetalol 150mg  tid  -continue on cardizem 20mg  q8  -continue losartan 50mg  (home medication per mother)--can increase further if needed  --clonidine prn  -re-introduced hctz 25mg  daily  -continue daily potassium now that hctz back on board  -decreased prednisone further to 20mg  daily which is also helping  -discussed dietary choices with patient     LOS (Days) 12 A FACE TO FACE EVALUATION WAS PERFORMED  Ranelle Oyster, MD 10/24/2016 8:37 AM

## 2016-10-24 NOTE — Plan of Care (Signed)
Problem: RH Balance Goal: LTG Patient will maintain dynamic standing balance (PT) LTG:  Patient will maintain dynamic standing balance with assistance during mobility activities (PT)  Outcome: Completed/Met Date Met: 10/24/16 With RW  Problem: RH Furniture Transfers Goal: LTG Patient will perform furniture transfers w/assist (OT/PT LTG: Patient will perform furniture transfers  with assistance (OT/PT).  Outcome: Completed/Met Date Met: 10/24/16 For sit<>stand from bed  Problem: RH Ambulation Goal: LTG Patient will ambulate in controlled environment (PT) LTG: Patient will ambulate in a controlled environment, # of feet with assistance (PT).  Outcome: Completed/Met Date Met: 10/24/16 100 ft with RW Goal: LTG Patient will ambulate in home environment (PT) LTG: Patient will ambulate in home environment, # of feet with assistance (PT).  Outcome: Completed/Met Date Met: 10/24/16 50 ft with RW  Problem: RH Wheelchair Mobility Goal: LTG Patient will propel w/c in controlled environment (PT) LTG: Patient will propel wheelchair in controlled environment, # of feet with assist (PT)  Outcome: Adequate for Discharge Pt to d/c at ambulatory level  Problem: RH Stairs Goal: LTG Patient will ambulate up and down stairs w/assist (PT) LTG: Patient will ambulate up and down # of stairs with assistance (PT)  Outcome: Completed/Met Date Met: 10/24/16 16 steps with L rail

## 2016-10-24 NOTE — Plan of Care (Signed)
Problem: RH Toilet Transfers Goal: LTG Patient will perform toilet transfers w/assist (OT) LTG: Patient will perform toilet transfers with assist, with/without cues using equipment (OT)  Outcome: Not Met (add Reason) Pt needing supervision - steady assistance for safety

## 2016-10-24 NOTE — Patient Care Conference (Signed)
Inpatient RehabilitationTeam Conference and Plan of Care Update Date: 10/23/2016   Time: 2:45 PM    Patient Name: Erika Boyer      Medical Record Number: [id number]  Date of Birth: April 17, 1996 Sex: Female         Room/Bed: 4W04C/4W04C-01 Payor Info: Payor: MEDICARE / Plan: MEDICARE PART A AND B / Product Type: *No Product type* /    Admitting Diagnosis: debility  Admit Date/Time:  10/12/2016  5:11 PM Admission Comments: No comment available   Primary Diagnosis:  Debility Principal Problem: Debility  Patient Active Problem List   Diagnosis Date Noted  . Malignant hypertension 10/18/2016  . Cognitive disorder   . Debility 10/12/2016  . Leg swelling   . Shortness of breath   . Cerebral atrophy   . MR (mental retardation)   . Drug-induced lupus erythematosus   . Tachycardia   . Hypokalemia   . Acute blood loss anemia   . S/P thoracentesis   . Sepsis (HCC) 10/03/2016  . Acute respiratory failure with hypoxia (HCC) 10/03/2016  . CAP (community acquired pneumonia) 10/03/2016  . Hyperthyroidism 10/03/2016  . Morbid obesity with BMI of 40.0-44.9, adult (HCC) 10/03/2016  . Acute kidney injury (HCC) 10/03/2016  . 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 Microcytic anemia 10/03/2016  . Cardiomegaly 10/03/2016  . Acute respiratory failure (HCC) 10/03/2016  . Lobar pneumonia (HCC)   . Pleural effusion   . Pleural effusion, left   . Graves disease 08/23/2016    Expected Discharge Date: Expected Discharge Date:  (possible SNF?  )  Team Members Present: Physician leading conference: Dr. Faith RogueZachary Swartz Social Worker Present: Amada JupiterLucy Maston Wight, LCSW Nurse Present: Chana Bodeeborah Sharp, RN;Luz Rosero, RN PT Present: Aleda GranaVictoria Miller, PT OT Present: Callie FieldingKatie Pittman, OT SLP Present: Jackalyn LombardNicole Page, SLP PPS Coordinator present : Tora DuckMarie Noel, RN, CRRN     Current Status/Progress Goal Weekly Team Focus  Medical   bp has been difficult to control, recent medication changes helping. Cr improving  stablize  bp   bp control, nutrition, renal function   Bowel/Bladder   continent of bowel and bladder. last BM 6/5.   Remain continent with bowel and bladder.  Monitor for changes in B/B daily and prn   Swallow/Nutrition/ Hydration             ADL's   supervision for grooming and UB self care with min A needed for LB self care, functional transfers/mobility. Hypertensive episode and safety awareness are greatest barriers  Mod I toilet transfers and UB dressing, supervision overall  self care retraining, balance, endurance, safety, pt/family education   Mobility   min assist with RW, min assist stair negotiation with 1 rail, very poor safety awareness  supervision overall  pt education, balance, strengthening, transfers, stair negotiation, gait, awareness   Communication             Safety/Cognition/ Behavioral Observations  Min A   Min A-Supervision   recall, problem solving, awareness    Pain   Denies pain this shift.  Pain less than or equal to 2.  Monitor pain q shift and prn   Skin   Skin is intact, no skin breakdown.  No skin breakdown while in rehab.  Assess skin q shift and prn.    Rehab Goals Patient on target to meet rehab goals: No Rehab Goals Revised: still with significant safety concerns *See Care Plan and progress notes for long and short-term goals.  Barriers to Discharge: safety awareness, labile bp    Possible  Resolutions to Barriers:  continued bp mgt, family/pt ed    Discharge Planning/Teaching Needs:  Plan to return home with mother who works a few hours / week.  Mother does not have a plan for coverage when she is at work.  will need to discuss SNF option.  Teaching planned for Wed at 1:00 pm   Team Discussion:  Very poor balance and poor safety awareness.  Min assist with PT/OT overall but can be supervision with seated activities.  Min assist with cognition. No amount of education seems to improve awareness or safety.  MD reports BP is stabilizing and not a  concern for d/c.  MD reports he has spoken with pt and mother about their medical concerns and does not feel their concerns are realistic/ valid.  Decreasing steroids and hope this may decreased her voracious appetite.  Cont b/b.  Mother to be in tomorrow for family ed.  SW request that PA be available to address medical concerns.  Revisions to Treatment Plan:  May need to downgrade some goals to min assist.  SW to discuss option of SNF if mother unable to coordinate 24/7 care.   Continued Need for Acute Rehabilitation Level of Care: The patient requires daily medical management by a physician with specialized training in physical medicine and rehabilitation for the following conditions: Daily direction of a multidisciplinary physical rehabilitation program to ensure safe treatment while eliciting the highest outcome that is of practical value to the patient.: Yes Daily medical management of patient stability for increased activity during participation in an intensive rehabilitation regime.: Yes Daily analysis of laboratory values and/or radiology reports with any subsequent need for medication adjustment of medical intervention for : Blood pressure problems;Neurological problems  Erika Boyer 10/24/2016, 10:22 AM

## 2016-10-24 NOTE — Progress Notes (Signed)
Physical Therapy Session Note  Patient Details  Name: Erika Boyer MRN: [id number] Date of Birth: 06-25-1995  Today's Date: 10/24/2016 PT Individual Time: 1135-1202 and [id number] PT Individual Time Calculation (min): 27 min and 40 min   Short Term Goals: Week 2:  PT Short Term Goal 1 (Week 2): STG = LTG due to anticipated d/c date.   Skilled Therapeutic Interventions/Progress Updates:  Treatment 1: Pt received in room & agreeable to tx. Pt reporting HA & dizziness (that's been occurring all day) - RN made aware of pt's c/o pain. Vitals at beginning of session: BP = 137/99 mmHg, HR = 97 bpm. Vitals at end of session: BP = 129/96 mmHg, HR = 94 bpm. Session focused on grad day activities and continued pt education. Pt completed car transfer, ambulation with RW over uneven surface (mulch), curb negotiation with RW, negotiating 16 steps (6") with L rail only, and stand pivot transfers with min assist overall. Pt continues to demonstrate ataxic gait and very poor balance and safety awareness overall, requiring frequent cuing for safety. Pt requires cuing for RW placement and sequencing when negotiating curb. At end of session pt left sitting in recliner in room with all needs within reach.   Treatment 2: Pt received in room with mother Erika So(Phyllis) present for caregiver training. Pt reporting her HA has improved, vitals: BP = 132/89 mmHg, HR = 100 bpm. Session focused on caregiver training/education and hands on participation with gait, transfers, and stair negotiation. Therapist thoroughly educated pt & mother on need for 24 hr assistance upon d/c home. Educated them that pt is only able to safely perform stand pivot transfers with close supervision if bed/chair have been set up for her, otherwise pt requires hands on physical assist for all other mobility tasks. Educated pt & mother on pt's Sharlene MottsBerg Balance test score of 19/56 and pt being a high fall risk. Therapist provided demonstration for use of gait belt  and positioning when pt ambulated and negotiated stairs. Pt ambulated up to 70 ft with RW & min assist, ambulated over uneven terrain (mulch), then negotiated steps with L rail only, and negotiated a curb with therapist providing assistance then mother & pt return demonstrating all of these activities. Pt's mother requires MAX education to maintain physical contact with pt while she is ambulating as mother would initially only hold to her when negotiating stairs and would otherwise allow pt to ambulate unsafely with supervision. Phyllis assisted pt with negotiating a ramp and completing a car transfer at sedan simulated height. Pt continues to require mod/max cuing from therapist for increased safety and overall awareness. Pt and mother both feel comfortable with pt discharging home tomorrow at current level of function; neither pt nor mother report concerns regarding d/c home. At end of session pt left in w/c in dayroom with mother present and awaiting OT arrival.  Therapy Documentation Precautions:  Precautions Precautions: Fall Restrictions Weight Bearing Restrictions: No    See Function Navigator for Current Functional Status.   Therapy/Group: Individual Therapy  Sandi MariscalVictoria M Grover Robinson 10/24/2016, 5:36 PM

## 2016-10-24 NOTE — Progress Notes (Signed)
Occupational Therapy Session Note  Patient Details  Name: Erika Boyer MRN: [id number] Date of Birth: [birth year]  Today's Date: 10/24/2016 OT Individual Time: 0800-0900 and 1345-1430 OT Individual Time Calculation (min): 60 min and 45 min   Short Term Goals: Week 1:  OT Short Term Goal 1 (Week 1): Pt will complete 3 grooming tasks in standing in order to increase functional activity tolerance OT Short Term Goal 2 (Week 1): Pt will dress LB with set-up/ dupervision using AE PRN OT Short Term Goal 3 (Week 1): Pt will complete 3/3 toileting tasks with supervision OT Short Term Goal 4 (Week 1): Pt will tolerate full bathing/dressing session with no more than 2 rest breaks Week 2:  OT Short Term Goal 1 (Week 2): STGs=LTGs due to ELOS  Skilled Therapeutic Interventions/Progress Updates:    Session 1:Upon entering the room, pt seated on EOB finishing breakfast. BP taken at while seated on EOB of 128/96. Pt with c/o headache this session and RN arrived with medications.Pt ambulating with RW and steady assistance to dresser to obtain clothing items. Pt continuing into bathroom for toileting needs with overall supervision. Pt removing clothing while seated on toilet and transferred into shower with use of RW and supervision. Pt bathing from TTB with overall close supervision from seated position. Pt ambulating to sit on EOB and don clothing items. Pt continued to report headache pain and returns to supine to rest. Call bell and all needed items within reach.   Session 2: Pt's mother present for family education. Pt with no c/o pain this session. OT demonstrated floor transfer secondary to pt's high fall risk at home. Pt returned demonstration from floor with steady assistance for balance with technique. Caregiver refused to engage in this task with pt. OT propelled wheelchair to ADL apartment and demonstrated transfer onto TTB with use of RW. Caregiver provided min A for pt to ambulate into bathroom and  supervision for transfer onto TTB . OT educated caregiver on fall risks within the home as well as continued education regarding recommended 24/7 supervision and caregiver verbalized understanding. Pt returning to room and transferred onto toilet with assistance from mother to have BM. No further needs or questions at this time.   Therapy Documentation Precautions:  Precautions Precautions: Fall Restrictions Weight Bearing Restrictions: No General:   Vital Signs: Therapy Vitals BP: 126/82 Patient Position (if appropriate): Lying  See Function Navigator for Current Functional Status.   Therapy/Group: Individual Therapy  Alen BleacherBradsher, Deangelo Berns P 10/24/2016, 12:09 PM

## 2016-10-24 NOTE — Progress Notes (Signed)
Speech Language Pathology Session Note & Discharge Summary  Patient Details  Name: Erika Boyer MRN: [id number] Date of Birth: [birth year]  Today's Date: 10/24/2016 SLP Individual Time: 1430-1458 SLP Individual Time Calculation (min): 28 min   Skilled Therapeutic Interventions:  Skilled treatment session focused on completion of patient and family education with the patient's mother. Patient's mother educated in regards to patient's current cognitive function, importance/need for 24 hour supervision to maximize safety and strategies to utilize at home to maximize recall/safety such as establishing a routine and utilizing a pill box for medications. She verbalized understanding of all information. Patient left upright in recliner with all needs within reach and RN present.   Patient has met 3 of 3 long term goals.  Patient to discharge at [facility] level.   Reasons goals not met: N/A   Clinical Impression/Discharge Summary: Patient has made functional gains and has met 3 of 3 LTG's this admission. Currently, patient requires overall Min A to complete functional and familiar tasks safely in regards to problem solving, recall of functional information and anticipatory awareness. Patient and family education is complete and patient will discharge home with 24 hour supervision. Patient would benefit from home health SLP services to maximize her cognitive function and overall functional independence in order to reduce caregiver burden.   Care Partner:  Caregiver Able to Provide Assistance: Yes  Type of Caregiver Assistance: Physical;Cognitive  Recommendation:  24 hour supervision/assistance;Home Health SLP  Rationale for SLP Follow Up: Reduce caregiver burden;Maximize cognitive function and independence   Equipment: N/A   Reasons for discharge: Discharged from hospital   Patient/Family Agrees with Progress Made and Goals Achieved: Yes   Function:  Cognition Comprehension Comprehension  assist level: Follows basic conversation/direction with extra time/assistive device  Expression   Expression assist level: Expresses complex ideas: With extra time/assistive device  Social Interaction Social Interaction assist level: Interacts appropriately 90% of the time - Needs monitoring or encouragement for participation or interaction.  Problem Solving Problem solving assist level: Solves basic 75 - 89% of the time/requires cueing 10 - 24% of the time  Memory Memory assist level: Recognizes or recalls 50 - 74% of the time/requires cueing 25 - 49% of the time   Evaline Waltman 10/24/2016, 3:12 PM

## 2016-10-24 NOTE — Progress Notes (Signed)
Physical Therapy Discharge Summary  Patient Details  Name: Erika Boyer MRN: [id number] Date of Birth: 02-15-1996  Today's Date: 10/24/2016   Patient has met 9 of 9 long term goals due to improved balance, increased strength and ability to compensate for deficits.  Patient to discharge at an ambulatory level [place].   Patient's mother has participated in hands on caregiver training and received education from therapist regarding need for safety and min assist at home. Pt's mother reports comfort with assisting pt at d/c.  Reasons goals not met: n/a  Recommendation:  Patient will benefit from ongoing skilled PT services in home health setting to continue to advance safe functional mobility, address ongoing impairments in decreased balance, impaired coordination, decreased endurance, impaired safety awareness & problem solving, gait & transfer training, stair negotiation and minimize fall risk.  Equipment: No equipment provided - pt reports she already has RW at home.  Reasons for discharge: treatment goals met  Patient/family agrees with progress made and goals achieved: Yes  PT Discharge Precautions/Restrictions Precautions Precautions: Fall Precaution Comments: monitor BP Restrictions Weight Bearing Restrictions: No  Cognition Overall Cognitive Status: History of cognitive impairments - at baseline Arousal/Alertness: Awake/alert Orientation Level: Oriented X4 Memory: Impaired Awareness: Impaired Awareness Impairment: Anticipatory impairment Problem Solving: Impaired Behaviors: Impulsive Safety/Judgment: Impaired  Motor  Motor Motor:  (general weakness) Motor - Discharge Observations: generalized weakness   Mobility Bed Mobility Bed Mobility: Supine to Sit;Sit to Supine;Rolling Left;Rolling Right Rolling Right: 6: Modified independent (Device/Increase time) Rolling Left: 6: Modified independent (Device/Increase time) Supine to Sit: 6: Modified independent  (Device/Increase time) Sit to Supine: 6: Modified independent (Device/Increase time) Transfers Transfers: Yes Sit to Stand: 5: Supervision;With armrests Stand Pivot Transfers: 5: Supervision;With armrests  Locomotion  Ambulation Ambulation: Yes Ambulation/Gait Assistance: 4: Min assist Ambulation Distance (Feet): 100 Feet Assistive device: Rolling walker Ambulation/Gait Assistance Details: Verbal cues for precautions/safety Gait Gait: Yes Gait Pattern: Ataxic;Decreased step length - left;Decreased step length - right;Decreased stance time - left;Decreased stance time - right Stairs / Additional Locomotion Stairs: Yes Stairs Assistance: 4: Min assist Stair Management Technique: One rail Left Number of Stairs: 16 Height of Stairs: 6 (inches) Ramp: 4: Min assist (with RW) Curb: 4: Min assist (max cuing for technique with RW)   Trunk/Postural Assessment  Postural Control Postural Control: Deficits on evaluation Righting Reactions: impaired Protective Responses: impaired   Balance Balance Balance Assessed: Yes Standardized Balance Assessment  Standardized Balance Assessment: Berg Balance Test Berg Balance Test Sit to Stand: Able to stand without using hands and stabilize independently Standing Unsupported: Able to stand 2 minutes with supervision Sitting with Back Unsupported but Feet Supported on Floor or Stool: Able to sit safely and securely 2 minutes Stand to Sit: Sits safely with minimal use of hands (requires cuing to control descent) Transfers: Needs two people to assist of supervise to be safe (pt with LOB requiring 2 person assist to prevent fall) Standing Unsupported with Eyes Closed: Able to stand 3 seconds Standing Ubsupported with Feet Together: Needs help to attain position and unable to hold for 15 seconds From Standing, Reach Forward with Outstretched Arm: Reaches forward but needs supervision (reaches forward 10 inches) From Standing Position, Pick up Object  from Floor: Unable to try/needs assist to keep balance (steady assist for balance) From Standing Position, Turn to Look Behind Over each Shoulder: Needs supervision when turning Turn 360 Degrees: Needs assistance while turning Standing Unsupported, Alternately Place Feet on Step/Stool: Needs assistance to keep from falling or unable  to try Standing Unsupported, One Foot in Front: Loses balance while stepping or standing Standing on One Leg: Unable to try or needs assist to prevent fall Total Score: 19  Static Standing Balance Static Standing - Balance Support: No upper extremity supported Static Standing - Level of Assistance: 4: Min assist  Dynamic Standing Balance Dynamic Standing - Balance Support: Bilateral upper extremity supported Dynamic Standing - Level of Assistance: 4: Min assist Dynamic Standing - Balance Activities:  (during gait)  Extremity Assessment  RLE Assessment RLE Assessment: Within Functional Limits LLE Assessment LLE Assessment: Within Functional Limits   See Function Navigator for Current Functional Status.  Waunita Schooner 10/24/2016, 5:50 PM

## 2016-10-24 NOTE — Progress Notes (Signed)
Occupational Therapy Discharge Summary  Patient Details  Name: Erika Boyer MRN: [id number] Date of Birth: [birth year]    Patient has met 8 of 9 long term goals due to improved activity tolerance, improved balance, ability to compensate for deficits and improved coordination.  Patient to discharge at overall supervision - min A level.  Patient's care partner is independent to provide the necessary physical and cognitive assistance at discharge. Her mother arrived for hands on family education and has been educated on need for 24/7 supervision secondary to fall risk.  Reasons goals not met: toileting goal not met as pt needs supervision - steady assistance.  Recommendation:  Patient will benefit from ongoing skilled OT services in home health setting to continue to advance functional skills in the area of BADL and iADL.  Equipment: TTB  Reasons for discharge: treatment goals met  Patient/family agrees with progress made and goals achieved: Yes  OT Discharge Precautions/Restrictions  Precautions Precautions: Fall Precaution Comments: monitor BP Restrictions Weight Bearing Restrictions: No Vital Signs Therapy Vitals Temp: 98.6 F (37 C) Temp Source: Oral Pulse Rate: 85 Resp: 18 BP: 124/80 Patient Position (if appropriate): Sitting Oxygen Therapy SpO2: 98 % O2 Device: Not Delivered Pain Pain Assessment Pain Assessment: No/denies pain Vision Baseline Vision/History: No visual deficits Cognition Overall Cognitive Status: History of cognitive impairments - at baseline Arousal/Alertness: Awake/alert Orientation Level: Oriented X4 Memory: Impaired Memory Impairment: Decreased recall of new information Awareness: Impaired Awareness Impairment: Anticipatory impairment Problem Solving: Impaired Problem Solving Impairment: Functional basic Sequencing: Impaired Sequencing Impairment: Functional basic Self Monitoring Impairment: Functional basic Self Correcting  Impairment: Functional basic Behaviors: Impulsive Safety/Judgment: Impaired Motor  Motor Motor:  (general weakness) Motor - Discharge Observations: generalized weakness Mobility  Bed Mobility Bed Mobility: Supine to Sit;Sit to Supine;Rolling Left;Rolling Right Rolling Right: 6: Modified independent (Device/Increase time) Rolling Left: 6: Modified independent (Device/Increase time) Supine to Sit: 6: Modified independent (Device/Increase time) Sit to Supine: 6: Modified independent (Device/Increase time) Transfers Sit to Stand: 5: Supervision;With armrests  Trunk/Postural Assessment  Postural Control Postural Control: Deficits on evaluation Righting Reactions: impaired Protective Responses: impaired  Balance Balance Balance Assessed: Yes Standardized Balance Assessment Standardized Balance Assessment: Berg Balance Test Berg Balance Test Sit to Stand: Able to stand without using hands and stabilize independently Standing Unsupported: Able to stand 2 minutes with supervision Sitting with Back Unsupported but Feet Supported on Floor or Stool: Able to sit safely and securely 2 minutes Stand to Sit: Sits safely with minimal use of hands (requires cuing to control descent) Transfers: Needs two people to assist of supervise to be safe (pt with LOB requiring 2 person assist to prevent fall) Standing Unsupported with Eyes Closed: Able to stand 3 seconds Standing Ubsupported with Feet Together: Needs help to attain position and unable to hold for 15 seconds From Standing, Reach Forward with Outstretched Arm: Reaches forward but needs supervision (reaches forward 10 inches) From Standing Position, Pick up Object from Floor: Unable to try/needs assist to keep balance (steady assist for balance) From Standing Position, Turn to Look Behind Over each Shoulder: Needs supervision when turning Turn 360 Degrees: Needs assistance while turning Standing Unsupported, Alternately Place Feet on  Step/Stool: Needs assistance to keep from falling or unable to try Standing Unsupported, One Foot in Front: Loses balance while stepping or standing Standing on One Leg: Unable to try or needs assist to prevent fall Total Score: 19 Dynamic Sitting Balance Dynamic Sitting - Balance Support: During functional activity;Feet supported Dynamic Sitting - Level of  Assistance: 6: Modified independent (Device/Increase time) Static Standing Balance Static Standing - Balance Support: No upper extremity supported Static Standing - Level of Assistance: 4: Min assist;5: Stand by assistance Dynamic Standing Balance Dynamic Standing - Balance Support: Bilateral upper extremity supported Dynamic Standing - Level of Assistance: 4: Min assist Dynamic Standing - Balance Activities:  (during gait) Extremity/Trunk Assessment RUE Assessment RUE Assessment: Within Functional Limits LUE Assessment LUE Assessment: Within Functional Limits   See Function Navigator for Current Functional Status.  Gypsy Decant 10/24/2016, 5:47 PM

## 2016-10-25 ENCOUNTER — Inpatient Hospital Stay: Payer: Medicare Other | Admitting: Pulmonary Disease

## 2016-10-25 MED ORDER — LOSARTAN POTASSIUM 50 MG PO TABS: 50.0000 mg | ORAL_TABLET | Freq: Every day | ORAL | 0 refills | Status: DC

## 2016-10-25 MED ORDER — POTASSIUM CHLORIDE CRYS ER 20 MEQ PO TBCR: 20.0000 meq | EXTENDED_RELEASE_TABLET | Freq: Two times a day (BID) | ORAL | 0 refills | Status: DC

## 2016-10-25 MED ORDER — PROSIGHT PO TABS: 1.0000 | ORAL_TABLET | Freq: Every day | ORAL | 0 refills | Status: DC

## 2016-10-25 MED ORDER — HYDROCHLOROTHIAZIDE 25 MG PO TABS: 25.0000 mg | ORAL_TABLET | Freq: Every day | ORAL | 0 refills | Status: DC

## 2016-10-25 MED ORDER — PREDNISONE 20 MG PO TABS: 20.0000 mg | ORAL_TABLET | Freq: Every day | ORAL | 0 refills | Status: DC

## 2016-10-25 MED ORDER — PROCHLORPERAZINE MALEATE 5 MG PO TABS: 5.0000 mg | ORAL_TABLET | Freq: Four times a day (QID) | ORAL | 1 refills | Status: DC | PRN

## 2016-10-25 MED ORDER — LABETALOL HCL 300 MG PO TABS: 150.0000 mg | ORAL_TABLET | Freq: Three times a day (TID) | ORAL | 0 refills | Status: DC

## 2016-10-25 MED ORDER — POLYSACCHARIDE IRON COMPLEX 150 MG PO CAPS: 150.0000 mg | ORAL_CAPSULE | Freq: Two times a day (BID) | ORAL | 0 refills | Status: DC

## 2016-10-25 MED ORDER — FAMOTIDINE 20 MG PO TABS: 20.0000 mg | ORAL_TABLET | Freq: Two times a day (BID) | ORAL | 0 refills | Status: DC

## 2016-10-25 MED ORDER — PANTOPRAZOLE SODIUM 20 MG PO TBEC: 20.0000 mg | DELAYED_RELEASE_TABLET | Freq: Every day | ORAL | 1 refills | Status: DC

## 2016-10-25 NOTE — Discharge Instructions (Signed)
Inpatient Rehab Discharge Instructions  Erika Boyer Discharge date and time: 10/25/16   Activities/Precautions/ Functional Status: Activity: activity as tolerated Diet: low fat, low cholesterol diet--low salt Wound Care: none needed   Functional status:  ___ No restrictions     ___ Walk up steps independently _X__ 24/7 supervision/assistance   ___ Walk up steps with assistance ___ Intermittent supervision/assistance  ___ Bathe/dress independently ___ Walk with walker     _X__ Bathe/dress with assistance ___ Walk Independently    ___ Shower independently _X__ Walk with assistance    ___ Shower with assistance ___ No alcohol     ___ Return to work/school ________    COMMUNITY REFERRALS UPON DISCHARGE:    Home Health:   PT     OT     ST    RN     SW                    Agency:  [facility] Phone: [phone]  Medical Equipment/Items Ordered: tub bench                                                     Agency/Supplier: [facility] @ [phone]  Special Instructions:      Calorie Counting for Weight Loss Calories are units of energy. Your body needs a certain amount of calories from food to keep you going throughout the day. When you eat more calories than your body needs, your body stores the extra calories as fat. When you eat fewer calories than your body needs, your body burns fat to get the energy it needs. Calorie counting means keeping track of how many calories you eat and drink each day. Calorie counting can be helpful if you need to lose weight. If you make sure to eat fewer calories than your body needs, you should lose weight. Ask your health care provider what a healthy weight is for you. For calorie counting to work, you will need to eat the right number of calories in a day in order to lose a healthy amount of weight per week. A dietitian can help you determine how many calories you need in a day and will give you suggestions on how to reach your  calorie goal.  A healthy amount of weight to lose per week is usually 1-2 lb (0.5-0.9 kg). This usually means that your daily calorie intake should be reduced by 500-750 calories.  Eating 1,200 - 1,500 calories per day can help most women lose weight.  Eating 1,500 - 1,800 calories per day can help most men lose weight.  What is my plan? My goal is to have __________ calories per day. If I have this many calories per day, I should lose around __________ pounds per week. What do I need to know about calorie counting? In order to meet your daily calorie goal, you will need to:  Find out how many calories are in each food you would like to eat. Try to do this before you eat.  Decide how much of the food you plan to eat.  Write down what you ate and how many calories it had. Doing this is called keeping a food log.  To successfully lose weight, it is important to balance calorie counting with a healthy lifestyle that includes regular activity. Aim for  150 minutes of moderate exercise (such as walking) or 75 minutes of vigorous exercise (such as running) each week. Where do I find calorie information?  The number of calories in a food can be found on a Nutrition Facts label. If a food does not have a Nutrition Facts label, try to look up the calories online or ask your dietitian for help. Remember that calories are listed per serving. If you choose to have more than one serving of a food, you will have to multiply the calories per serving by the amount of servings you plan to eat. For example, the label on a package of bread might say that a serving size is 1 slice and that there are 90 calories in a serving. If you eat 1 slice, you will have eaten 90 calories. If you eat 2 slices, you will have eaten 180 calories. How do I keep a food log? Immediately after each meal, record the following information in your food log:  What you ate. Don't forget to include toppings, sauces, and other extras  on the food.  How much you ate. This can be measured in cups, ounces, or number of items.  How many calories each food and drink had.  The total number of calories in the meal.  Keep your food log near you, such as in a small notebook in your pocket, or use a mobile app or website. Some programs will calculate calories for you and show you how many calories you have left for the day to meet your goal. What are some calorie counting tips?  Use your calories on foods and drinks that will fill you up and not leave you hungry: ? Some examples of foods that fill you up are nuts and nut butters, vegetables, lean proteins, and high-fiber foods like whole grains. High-fiber foods are foods with more than 5 g fiber per serving. ? Drinks such as sodas, specialty coffee drinks, alcohol, and juices have a lot of calories, yet do not fill you up.  Eat nutritious foods and avoid empty calories. Empty calories are calories you get from foods or beverages that do not have many vitamins or protein, such as candy, sweets, and soda. It is better to have a nutritious high-calorie food (such as an avocado) than a food with few nutrients (such as a bag of chips).  Know how many calories are in the foods you eat most often. This will help you calculate calorie counts faster.  Pay attention to calories in drinks. Low-calorie drinks include water and unsweetened drinks.  Pay attention to nutrition labels for "low fat" or "fat free" foods. These foods sometimes have the same amount of calories or more calories than the full fat versions. They also often have added sugar, starch, or salt, to make up for flavor that was removed with the fat.  Find a way of tracking calories that works for you. Get creative. Try different apps or programs if writing down calories does not work for you. What are some portion control tips?  Know how many calories are in a serving. This will help you know how many servings of a certain  food you can have.  Use a measuring cup to measure serving sizes. You could also try weighing out portions on a kitchen scale. With time, you will be able to estimate serving sizes for some foods.  Take some time to put servings of different foods on your favorite plates, bowls, and cups so you know  what a serving looks like.  Try not to eat straight from a bag or box. Doing this can lead to overeating. Put the amount you would like to eat in a cup or on a plate to make sure you are eating the right portion.  Use smaller plates, glasses, and bowls to prevent overeating.  Try not to multitask (for example, watch TV or use your computer) while eating. If it is time to eat, sit down at a table and enjoy your food. This will help you to know when you are full. It will also help you to be aware of what you are eating and how much you are eating. What are tips for following this plan? Reading food labels  Check the calorie count compared to the serving size. The serving size may be smaller than what you are used to eating.  Check the source of the calories. Make sure the food you are eating is high in vitamins and protein and low in saturated and trans fats. Shopping  Read nutrition labels while you shop. This will help you make healthy decisions before you decide to purchase your food.  Make a grocery list and stick to it. Cooking  Try to cook your favorite foods in a healthier way. For example, try baking instead of frying.  Use low-fat dairy products. Meal planning  Use more fruits and vegetables. Half of your plate should be fruits and vegetables.  Include lean proteins like poultry and fish. How do I count calories when eating out?  Ask for smaller portion sizes.  Consider sharing an entree and sides instead of getting your own entree.  If you get your own entree, eat only half. Ask for a box at the beginning of your meal and put the rest of your entree in it so you are not  tempted to eat it.  If calories are listed on the menu, choose the lower calorie options.  Choose dishes that include vegetables, fruits, whole grains, low-fat dairy products, and lean protein.  Choose items that are boiled, broiled, grilled, or steamed. Stay away from items that are buttered, battered, fried, or served with cream sauce. Items labeled crispy are usually fried, unless stated otherwise.  Choose water, low-fat milk, unsweetened iced tea, or other drinks without added sugar. If you want an alcoholic beverage, choose a lower calorie option such as a glass of wine or light beer.  Ask for dressings, sauces, and syrups on the side. These are usually high in calories, so you should limit the amount you eat.  If you want a salad, choose a garden salad and ask for grilled meats. Avoid extra toppings like bacon, cheese, or fried items. Ask for the dressing on the side, or ask for olive oil and vinegar or lemon to use as dressing.  Estimate how many servings of a food you are given. For example, a serving of cooked rice is  cup or about the size of half a baseball. Knowing serving sizes will help you be aware of how much food you are eating at restaurants. The list below tells you how big or small some common portion sizes are based on everyday objects: ? 1 oz--4 stacked dice. ? 3 oz--1 deck of cards. ? 1 tsp--1 die. ? 1 Tbsp-- a ping-pong ball. ? 2 Tbsp--1 ping-pong ball. ?  cup-- baseball. ? 1 cup--1 baseball. Summary  Calorie counting means keeping track of how many calories you eat and drink each day. If you  eat fewer calories than your body needs, you should lose weight.  A healthy amount of weight to lose per week is usually 1-2 lb (0.5-0.9 kg). This usually means reducing your daily calorie intake by 500-750 calories.  The number of calories in a food can be found on a Nutrition Facts label. If a food does not have a Nutrition Facts label, try to look up the calories  online or ask your dietitian for help.  Use your calories on foods and drinks that will fill you up, and not on foods and drinks that will leave you hungry.  Use smaller plates, glasses, and bowls to prevent overeating. This information is not intended to replace advice given to you by your health care provider. Make sure you discuss any questions you have with your health care provider. Document Released: 05/07/2005 Document Revised: 04/06/2016 Document Reviewed: 04/06/2016 Elsevier Interactive Patient Education  2017 [employer].    My questions have been answered and I understand these instructions. I will adhere to these goals and the provided educational materials after my discharge from the hospital.  Patient/Caregiver Signature _______________________________ Date __________  Clinician Signature _______________________________________ Date __________  Please bring this form and your medication list with you to all your follow-up doctor's appointments.

## 2016-10-25 NOTE — Progress Notes (Signed)
Recreational Therapy Discharge Summary Patient Details  Name: Erika FlatteryOmaure Mckibbin MRN: [id number] Date of Birth: 10-10-1995 Today's Date: 10/25/2016  Comments on progress toward goals: Pt participated in scheduled TR during co-treat with PT this admission.  Session focused on safety awareness, activity tolerance, dynamic standing balance & discharge planning.  LRT informally visited with pt x2 during LOS to offer emotional support and offer in room leisure activities for diversion.  Pt is scheduled to discharge home today with team recommendation of 24 hour supervision/assistance due to high fall risk.  Reasons for discharge: discharge from hospital  Patient/family agrees with progress made and goals achieved: Yes  Yanai Hobson 10/25/2016, 8:46 AM

## 2016-10-25 NOTE — Progress Notes (Signed)
Baxter Flatterymaure Erika Boyer to be D/C'd Home per MD order.  Discussed with the patient and all questions fully answered.  VSS, Skin clean, dry and intact without evidence of skin break down, no evidence of skin tears noted.  Site without signs and symptoms of complications.   An After Visit Summary was printed and given to the patient. Patient received prescription.  D/c education completed with patient/family including follow up instructions, medication list, d/c activities limitations if indicated, with other d/c instructions as indicated by MD - patient able to verbalize understanding, all questions fully answered.   Patient instructed to return to ED, call 911, or call MD for any changes in condition.   Patient escorted via WC, and D/C home via private auto.  Rock NephewLisbeth  Christe Tellez 10/25/2016 10:57 AM

## 2016-10-25 NOTE — Progress Notes (Signed)
[place] PHYSICAL MEDICINE & REHABILITATION     PROGRESS NOTE    Subjective/Complaints: No new complaints. Feels well this morning.   ROS: pt denies nausea, vomiting, diarrhea, cough, shortness of breath or chest pain     Objective: Vital Signs: Blood pressure 135/80, pulse 80, temperature 98.2 F (36.8 C), temperature source Oral, resp. rate 18, height 5\' 5"  (1.651 m), weight 105.3 kg (232 lb 2.3 oz), last menstrual period 10/08/2016, SpO2 98 %. No results found.  Recent Labs  10/24/16 0539  WBC 6.0  HGB 9.5*  HCT 30.5*  PLT 288    Recent Labs  10/24/16 0539  NA 137  K 4.1  CL 103  GLUCOSE 91  BUN 14  CREATININE 1.38*  CALCIUM 8.7*   CBG (last 3)  No results for input(s): GLUCAP in the last 72 hours.  Wt Readings from Last 3 Encounters:  10/25/16 105.3 kg (232 lb 2.3 oz)  10/12/16 122.5 kg (270 lb)  08/23/16 118.5 kg (261 lb 3.2 oz)    Physical Exam:  Constitutional: She is oriented to person, place, and time. She appears well-developedand well-nourished. No distress.  HENT:  Head: Normocephalicand atraumatic.  Mouth/Throat: Oropharynx is clear and moist.  Eyes: EOMI  Neck:  . Neck supple.  Cardiovascular: RRR Respiratory: CTA B GI: Soft. NT .  Musculoskeletal:  She exhibits no tenderness.  No edema BLE Neurological: She is alertand oriented to person, place, and time. CN 2-12 normal.  Skin: Skin is warmand dry.  Marland Kitchen  Psychiatric:  cooperative. Fair insight and awareness, child-like  Assessment/Plan: 1. Functional and mobility deficits secondary to sepsis/debility which require 3+ hours per day of interdisciplinary therapy in a comprehensive inpatient rehab setting. Physiatrist is providing close team supervision and 24 hour management of active medical problems listed below. Physiatrist and rehab team continue to assess barriers to discharge/monitor patient progress toward functional and medical goals.  Function:  Bathing Bathing  position   Position: Shower  Bathing parts Body parts bathed by patient: Right arm, Left arm, Chest, Abdomen, Front perineal area, Buttocks, Right upper leg, Left upper leg, Right lower leg, Left lower leg, Back Body parts bathed by helper: Right lower leg, Left lower leg, Back  Bathing assist Assist Level: Supervision or verbal cues      Upper Body Dressing/Undressing Upper body dressing   What is the patient wearing?: Pull over shirt/dress     Pull over shirt/dress - Perfomed by patient: Thread/unthread left sleeve, Thread/unthread right sleeve, Put head through opening, Pull shirt over trunk Pull over shirt/dress - Perfomed by helper: Thread/unthread right sleeve, Put head through opening, Pull shirt over trunk        Upper body assist Assist Level: Supervision or verbal cues   Set up : To obtain clothing/put away  Lower Body Dressing/Undressing Lower body dressing   What is the patient wearing?: Underwear, Pants, Non-skid slipper socks Underwear - Performed by patient: Thread/unthread right underwear leg, Thread/unthread left underwear leg, Pull underwear up/down Underwear - Performed by helper: Thread/unthread right underwear leg, Thread/unthread left underwear leg Pants- Performed by patient: Thread/unthread right pants leg, Thread/unthread left pants leg, Pull pants up/down Pants- Performed by helper: Thread/unthread right pants leg, Thread/unthread left pants leg Non-skid slipper socks- Performed by patient: Don/doff right sock, Don/doff left sock Non-skid slipper socks- Performed by helper: Don/doff right sock, Don/doff left sock             TED Hose - Performed by patient: Don/doff right TED hose,  Don/doff left TED hose TED Hose - Performed by helper: Don/doff right TED hose, Don/doff left TED hose  Lower body assist Assist for lower body dressing: Touching or steadying assistance (Pt > 75%)      Toileting Toileting   Toileting steps completed by patient: Adjust  clothing prior to toileting, Performs perineal hygiene, Adjust clothing after toileting Toileting steps completed by helper: Adjust clothing prior to toileting, Performs perineal hygiene, Adjust clothing after toileting Toileting Assistive Devices: Grab bar or rail  Toileting assist Assist level: Supervision or verbal cues   Transfers Chair/bed transfer   Chair/bed transfer method: Stand pivot Chair/bed transfer assist level: Supervision or verbal cues Chair/bed transfer assistive device: Armrests     Locomotion Ambulation     Max distance: 100 ft Assist level: Touching or steadying assistance (Pt > 75%)   Wheelchair   Type: Manual Max wheelchair distance: 150 ft Assist Level: Supervision or verbal cues  Cognition Comprehension Comprehension assist level: Follows basic conversation/direction with extra time/assistive device  Expression Expression assist level: Expresses complex ideas: With extra time/assistive device  Social Interaction Social Interaction assist level: Interacts appropriately 90% of the time - Needs monitoring or encouragement for participation or interaction.  Problem Solving Problem solving assist level: Solves basic 75 - 89% of the time/requires cueing 10 - 24% of the time  Memory Memory assist level: Recognizes or recalls 50 - 74% of the time/requires cueing 25 - 49% of the time   Medical Problem List and Plan: 1. Debility secondary to Sepsis, lupuswith multiple medical issues.  -home today  -pt and mother understand risks and recommendations of team for 24 hour supervision  -Patient to see MD in the office for transitional care encounter in 1-2 weeks. 2. DVT Prophylaxis/Anticoagulation: Pharmaceutical: Lovenox 3. Pain Management: tylenol prn as well as increase in mobility should help with back pain.  4. Mood: LCSW to follow for evaluation and support.  5. Neuropsych: This patient is not fully capable of making decisions on hisown  behalf. 6. Skin/Wound Care: routine pressure relief measures.   protein supplement for low protein stores and to help with edema.  7. Fluids/Electrolytes/Nutrition:  Replacing potassium  -encourage PO 8. CAP with SOB: nebulizers, IS/FV  -5/25 with ?LLL > RLL infiltrate/effusions. Most recent cxr with decreased effusions, ?atelectasis  -off abx   9. Pleural effusion:   repeat CXR with improvement---continue to monitor. 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 Nickola MajorHawkes after discharge 11. Peripheral edema: Monitor weights daily.  12. Iron deficiency anemia: Improved with transfusion. Add iron supplement. :  13. Tachycardia: Monitor BP/HR bid --labetalol 14. Hyperthyroid: off methamazole currently. Thyroid studies within normal range 15. ARI: lasix held  -likely vanc effect too   -serial labs--Cr down to 1.38   16. Malignant HTN: showing improvement. Hopefully won't over correct  -continue labetalol 150mg  tid  -hold cardizem 20mg  q8---may not need now that steroid decreased  -continue losartan 50mg  (home medication per mother)--can increase further if needed  --clonidine prn  -continue  hctz 25mg  daily  -continue daily potassium now that hctz back on board  -decreased prednisone further to 20mg  daily   -discussed dietary choices with patient     LOS (Days) 13 A FACE TO FACE EVALUATION WAS PERFORMED  Ranelle OysterSWARTZ,ZACHARY T, MD 10/25/2016 8:26 AM

## 2016-10-25 NOTE — Consult Note (Signed)
   [facility]   10/25/2016  Baxter FlatteryOmaure Cosio July 02, 1995 [id number]   Thank you for this consult.  [facility].  Patient is currently not eligible for [facility]:   Reason:  Not a beneficiary currently attributed to one of the [facility] populations.  Membership roster was used to verify non- eligible status.  For any questions please contact:  Charlesetta ShanksVictoria Kem Hensen, RN BSN CCM [facility] Liaison  [phone] business mobile phone Toll free office [phone]

## 2016-10-25 NOTE — Progress Notes (Signed)
Social Work  Discharge Note  The overall goal for the admission was met for:   Discharge location: Yes - home with mother  Length of Stay: Yes - 13 days  Discharge activity level: No - original goals downgraded to min assist - this level was met  Home/community participation: No - same  Services provided included: MD, RD, PT, OT, SLP, RN, TR, Pharmacy, [place]: Medicare and Medicaid  Follow-up services arranged: Home Health: RN, PT, OT, ST, SW via [place], DME: tub bench via [place], Other: referred for PCS Aide via [place] [facility] and Patient/Family has no preference for HH/DME agencies  Comments (or additional information):  Concerns about safety of pt at home as mother reports she will not be able to guarantee that pt will have 24/7 assistance.  Please see SW note from 10/24/16.  Patient/Family verbalized understanding of follow-up arrangements: Yes  Individual responsible for coordination of the follow-up plan: pt/mother  Confirmed correct DME delivered: Cataldo Cosgriff 10/25/2016    Kabria Hetzer

## 2016-10-25 NOTE — Discharge Summary (Signed)
Physician Discharge Summary  Patient ID: Erika Boyer MRN: [id number] DOB/AGE: [birth year] 21 y.o.  Admit date: 10/12/2016 Discharge date: 10/25/2016  Discharge Diagnoses:  Principal Problem:   Debility Active Problems:   Morbid obesity with BMI of 40.0-44.9, adult (HCC)   Acute kidney injury (HCC)   Drug-induced lupus erythematosus   Cognitive disorder   Malignant hypertension   Discharged Condition: good  Significant Diagnostic Studies: Dg Chest 2 View  Result Date: 10/16/2016 CLINICAL DATA:  Shortness of breath. EXAM: CHEST  2 VIEW COMPARISON:  10/13/2016 FINDINGS: Two views study shows decrease in the bilateral pleural effusions, left greater than right with persistent bibasilar atelectasis. Cardiopericardial silhouette appears enlarged but stable. The visualized bony structures of the thorax are intact. IMPRESSION: Decreasing bilateral pleural effusions with bilateral basilar atelectasis. Electronically Signed   By: Kennith Center M.D.   On: 10/16/2016 07:49   Dg Chest 2 View  Result Date: 10/13/2016 CLINICAL DATA:  Productive cough, shortness of breath. EXAM: CHEST  2 VIEW COMPARISON:  Radiograph Oct 12, 2016. FINDINGS: Stable cardiomegaly. No pneumothorax is noted. Stable bilateral pleural effusions are noted, with left larger than the right. Associated atelectasis in both lung bases is noted. Bony thorax is unremarkable. IMPRESSION: Stable cardiomegaly and bilateral pleural effusions, left greater than right, with associated atelectasis or infiltrates. Electronically Signed   By: Lupita Raider, M.D.   On: 10/13/2016 09:13   Dg Chest 2 View  Result Date: 10/08/2016 CLINICAL DATA:  Shortness of breath and weakness EXAM: CHEST  2 VIEW COMPARISON:  Two days prior FINDINGS: Cardiopericardial enlargement that is similar to priors. There has been pericardiocentesis per EMR. Moderate left pleural effusion tracking laterally. The underlying left lower lobe is opacified, nonspecific  between pneumonia and atelectasis. No pneumothorax. IMPRESSION: 1. Unchanged moderate left pleural effusion and opacified left lower lobe. 2. Persistent cardiopericardial enlargement despite pericardiocentesis. Electronically Signed   By: Marnee Spring M.D.   On: 10/08/2016 08:57   Ct Chest Wo Contrast  Result Date: 10/03/2016 CLINICAL DATA:  Initial evaluation for pleural effusion. EXAM: CT CHEST WITHOUT CONTRAST TECHNIQUE: Multidetector CT imaging of the chest was performed following the standard protocol without IV contrast. COMPARISON:  Comparison made with prior radiograph from earlier same day. FINDINGS: Cardiovascular: Intrathoracic aorta up Ing great vessels not well evaluated on this noncontrast examination. Heart size likely within normal limits, but difficult to discern as well. There is a large circumferential pericardial effusion. Mediastinum/Nodes: Endotracheal tube in place. Tip is somewhat low lying at the carina, and projecting towards the right mainstem bronchus. Retraction by approximately 2 cm suggested. Enteric tube courses into the stomach. Thyroid grossly normal. Evaluation for mediastinal and hilar adenopathy fairly limited on this exam. Few scattered mildly prominent axillary lymph nodes measure up to the upper limits of normal. Esophagus not well evaluated. Lungs/Pleura: There is a large left pleural effusion involving much of the left lung. Probable slight left-to-right shift of the mediastinal structures. Associated atelectasis and/or consolidation within the left lung, with only small amount of aeration present within the left upper lobe. Smaller layering right pleural effusion. Right basilar atelectasis and/ or consolidation present as well within the right lower lobe. Additional patchy infiltrate within the posterior right upper lobe as well. No obvious pulmonary edema. No pneumothorax. No appreciable pulmonary nodule or mass. Upper Abdomen: Partially visualized upper abdomen  grossly unremarkable. Musculoskeletal: No acute osseous abnormality. No worrisome lytic or blastic osseous lesions. IMPRESSION: 1. Large left pleural effusion. Associated opacity throughout the left  lung may reflect atelectasis and/or infiltrate. 2. Smaller right pleural effusion with associated right upper and lower lobe opacity, which also may reflect atelectasis or infiltrates. 3. Large circumferential pericardial effusion. 4. Tip of the endotracheal tube somewhat low lying at the carina, and directed towards the right mainstem bronchus. Retraction by 2 cm is recommended. Critical Value/emergent results were called by telephone at the time of interpretation on 10/03/2016 at 10:42 pm to the critical care nurse taking care of the patient, Feliberto GottronBrad Bass, who verbally acknowledged these results. Electronically Signed   By: Rise MuBenjamin  McClintock M.D.   On: 10/03/2016 22:45   [place] Renal  Result Date: 10/04/2016 CLINICAL DATA:  Renal failure EXAM: RENAL / URINARY TRACT ULTRASOUND COMPLETE COMPARISON:  None. FINDINGS: Right Kidney: Length: 12.9 cm. Echogenicity within normal limits. No mass or hydronephrosis visualized. Left Kidney: Length: 12.2 cm. Echogenicity within normal limits. No mass or hydronephrosis visualized. Bladder: Not well-visualized. Other: Small right pleural effusion.  Small volume ascites. IMPRESSION: Normal renal ultrasound.  No obstructive uropathy. Electronically Signed   By: Elige KoHetal  Patel   On: 10/04/2016 16:03   Dg Chest Port 1 View  Result Date: 10/12/2016 CLINICAL DATA:  Short of breath EXAM: PORTABLE CHEST 1 VIEW COMPARISON:  10/08/2016 FINDINGS: Moderate cardiomegaly with central vascular congestion. Small right pleural effusion. Moderate left pleural effusion slightly increased. Unable to exclude atelectasis or pneumonia at the left base. IMPRESSION: 1. Stable degree of moderate cardiomegaly with mild central congestion 2. Small right pleural effusion. Moderate left pleural effusion slightly  increased. Left lower lung atelectasis or pneumonia. Electronically Signed   By: Jasmine PangKim  Fujinaga M.D.   On: 10/12/2016 21:56      Labs:  Basic Metabolic Panel:  Recent Labs Lab 10/19/16 0902 10/20/16 0625 10/22/16 0706 10/24/16 0539  NA 140 135 137 137  K 3.9 3.8 3.9 4.1  CL 104 100* 103 103  CO2 27 26 26 25   GLUCOSE 92 77 82 91  BUN 13 12 12 14   CREATININE 1.61* 1.54* 1.40* 1.38*  CALCIUM 8.8* 8.7* 9.1 8.7*    CBC:  Recent Labs Lab 10/24/16 0539  WBC 6.0  HGB 9.5*  HCT 30.5*  MCV 76.4*  PLT 288    CBG: No results for input(s): GLUCAP in the last 168 hours.  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 CT chest reviewed, showing pleural and pericardial effusion. She was found to have large left pleural effusion due to CAP or viral illness as well as large pericardial effusion. she was intubated in ED and underwent left thoracocentesis for evacuation of 850 cc of cloudy exudative pleural effusion. She was placed on IV antibiotics as well as steroids due to concerns of drug induced SLE.. 2D echo with EF 60-65% with severe circumferential effusion with early tamponade and on 5/17 and she underwent pericardiocentesis of 870 cc of serosanguineous fluid. She was placed on colchicine empirically by Dr. Jacinto HalimGanji due to concerns of acute peritonitis v/s viral pericarditis with recommendations to repeat echo 1-2 days prior to discharge. 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   [facility] hospital[facility] has discussed case with Dr. Zenovia JordanAngela Hawkes who recommended  continuing prednisone 40 mg daily till follow up in office (they will contact  patient with appt). Repeat 2 D echo on 5/23 showed EF 55-60% with small to moderate partially loculated pericardial effusion with fibrinous strands c/w hemopericardium, organized thrombus and no chamber collapse or tamponade. AKI has improved with hydration. Anemia of chronic disease with drop in H/H to 6.8/21.8 treated with on unit PRBC. Her respiratory status and endurance is showing improvement. Therapy ongoing and she was noted to to be debilitated with substantial deficits in balance, mobility and decreased ability to carry out ADL tasks. CIR recommended for follow up therapy   Hospital Course: Herta Hink was admitted to rehab 10/12/2016 for inpatient therapies to consist of PT, ST and OT at least three hours five days a week. Past admission physiatrist, therapy team and rehab RN have worked together to provide customized collaborative inpatient rehab. Evening past admission, patient was noted to be tachypneic and hypoxic. CXR showed possible PNA and she was started on Vancomycin and Zosyn for treatment and treated with a dose of IV lasix for diuresis. She was instructed on importance of pulmonary hygiene and dyspnea has resolved. She did have worsening of renal status with rise in SCr to 2.11 due to elevated  Vancomycin level. Antibiotic coverage was narrowed to Zosyn and discontinued on 5/29 as CXR showed improvement.    Her blood pressures were extremely labile with with SBP frequentlhy rising to 180's and DBP up to 10's. She required multiple antihypertensive's to help manage her blood pressure in addition to prn catapres. Prednisone was decreased to 20 mg daily with improvement in BP control. As blood pressure have normalized, Cardizem was discontinued at discharge to prevent hypotensive episodes. HHRN has been arranged to monitor BP after discharge. She has been educated on low fat and low salt diet to help with weight loss and manage LE edema. Protein supplement was added to help with low  protein stores and manage BLE edema. Iron supplement was added due to anemia. Hypokalemia was supplemented and has resolved. Her renal status has steadily improved off vancomycin and she will need recheck of renal status couple of weeks after discharge. Repeat thyroid function 6/1 is WNL. Her goals were downgraded to min assist due to ataxia and poor safety awareness. Family education was completed regarding all aspects of mobility as well as safety. She will continue to receive follow up HHPT, HHOT, HHST, HHRN and HHSW by [facility] after discharge.    Rehab course: During patient's stay in rehab weekly team conferences were held to monitor patient's progress, set goals and discuss barriers to discharge. Her MoCA showed score of 28/30 but patient showed deficits in areas of recall of new information and anticipatory awareness. She required mod assist with mobility and basic self care tasks. She has had improvement in activity tolerance, balance, postural control, as well as ability to compensate for deficits. She is able to complete ADL tasks with supervision to steady assist. She is requires supervision for transfer and min assist for ambulating 100' with min assist and RW. She requires min assist to recall and utilize strategies for functional and familiar tasks.  Family education was completed with mother regarding safety, assistance with medications and mobility.      Disposition: 01-Home or Self Care  Diet: Low fat. Low salt  Special Instructions: 1. Needs 24 hours supervision for safety.   Discharge Instructions    Ambulatory referral to Physical Medicine Rehab    Complete by:  As directed    1-2 weeks transitional care appt     Allergies as of 10/25/2016      Reactions   Shellfish Allergy Anaphylaxis   Morphine And Related Itching   Mushroom Extract Complex Other (See Comments)   Mother is allergic, so patient avoids these   Tapazole [methimazole] Swelling, Rash   WELTS  (also) Mother suspects this MAY be an allergy, as the onset of symptoms coincided with this being started      Medication List    STOP taking these medications   docusate 50 MG/5ML liquid Commonly known as:  COLACE   EPIPEN 2-PAK 0.3 mg/0.3 mL Soaj injection Generic drug:  EPINEPHrine   furosemide 20 MG tablet Commonly known as:  LASIX   metoprolol tartrate 25 MG tablet Commonly known as:  LOPRESSOR   potassium chloride 20 MEQ/15ML (10%) Soln     TAKE these medications   aspirin 81 MG chewable tablet Chew 1 tablet (81 mg total) by mouth daily.   famotidine 20 MG tablet Commonly known as:  PEPCID Take 1 tablet (20 mg total) by mouth 2 (two) times daily.   hydrochlorothiazide 25 MG tablet Commonly known as:  HYDRODIURIL Take 1 tablet (25 mg total) by mouth daily. Start taking on:  10/26/2016   iron polysaccharides 150 MG capsule Commonly known as:  NIFEREX Take 1 capsule (150 mg total) by mouth 2 (two) times daily before lunch and supper.   labetalol 300 MG tablet Commonly known as:  NORMODYNE Take 0.5 tablets (150 mg total) by mouth 3 (three) times daily.   losartan 50 MG tablet Commonly known as:  COZAAR Take 1 tablet (50 mg total) by mouth daily. Start taking on:  10/26/2016   multivitamin Tabs tablet Take 1 tablet by mouth daily. Start taking on:  10/26/2016   pantoprazole 20 MG tablet Commonly known as:  PROTONIX Take 1 tablet (20 mg total) by mouth daily. What changed:  medication strength  how much to take  when to take this   potassium chloride SA 20 MEQ tablet Commonly known as:  K-DUR,KLOR-CON Take 1 tablet (20 mEq total) by mouth 2 (two) times daily.   predniSONE 20 MG tablet Commonly known as:  DELTASONE Take 1 tablet (20 mg total) by mouth daily with breakfast. Start taking on:  10/26/2016 What changed:  how much to take   PROAIR HFA 108 (90 Base) MCG/ACT inhaler Generic drug:  albuterol Inhale 2 puffs into the lungs every 4 (four) hours as  needed for wheezing or shortness of breath.   prochlorperazine 5 MG tablet Commonly known as:  COMPAZINE Take 1 tablet (5 mg total) by mouth every 6 (six) hours as needed for nausea or vomiting.      Follow-up Information    Loletta Specter, PA-C Follow up on 11/13/2016.   Specialty:  Physician Assistant Why:  @ 3:15 pm (hospital follow up appt) Contact information: [address] [place] [address] [phone]        Ranelle Oyster, MD Follow up.   Specialty:  Physical Medicine and Rehabilitation Why:  Office will call you with follow up appointment Contact information: [address] [place] [address] [phone]           Signed: Jacquelynn Cree 10/25/2016, 10:27 PM

## 2016-10-26 ENCOUNTER — Telehealth: Payer: Self-pay | Admitting: *Deleted

## 2016-10-26 NOTE — Telephone Encounter (Signed)
1st attempt made for transitional care. Left message for patient to contact us

## 2016-10-29 ENCOUNTER — Telehealth: Payer: Self-pay | Admitting: Physical Medicine & Rehabilitation

## 2016-10-29 ENCOUNTER — Encounter: Payer: Medicare Other | Attending: Physical Medicine & Rehabilitation | Admitting: Physical Medicine & Rehabilitation

## 2016-10-29 ENCOUNTER — Encounter: Payer: Self-pay | Admitting: Physical Medicine & Rehabilitation

## 2016-10-29 VITALS — BP 120/84 | HR 96

## 2016-10-29 DIAGNOSIS — R600 Localized edema: Secondary | ICD-10-CM | POA: Insufficient documentation

## 2016-10-29 DIAGNOSIS — G319 Degenerative disease of nervous system, unspecified: Secondary | ICD-10-CM | POA: Diagnosis not present

## 2016-10-29 DIAGNOSIS — Z818 Family history of other mental and behavioral disorders: Secondary | ICD-10-CM | POA: Insufficient documentation

## 2016-10-29 DIAGNOSIS — F419 Anxiety disorder, unspecified: Secondary | ICD-10-CM | POA: Diagnosis not present

## 2016-10-29 DIAGNOSIS — Z79899 Other long term (current) drug therapy: Secondary | ICD-10-CM | POA: Insufficient documentation

## 2016-10-29 DIAGNOSIS — T50905A Adverse effect of unspecified drugs, medicaments and biological substances, initial encounter: Secondary | ICD-10-CM | POA: Insufficient documentation

## 2016-10-29 DIAGNOSIS — Z8701 Personal history of pneumonia (recurrent): Secondary | ICD-10-CM | POA: Diagnosis not present

## 2016-10-29 DIAGNOSIS — L932 Other local lupus erythematosus: Secondary | ICD-10-CM | POA: Diagnosis not present

## 2016-10-29 DIAGNOSIS — Z8249 Family history of ischemic heart disease and other diseases of the circulatory system: Secondary | ICD-10-CM | POA: Insufficient documentation

## 2016-10-29 DIAGNOSIS — R000 Tachycardia, unspecified: Secondary | ICD-10-CM | POA: Insufficient documentation

## 2016-10-29 DIAGNOSIS — N289 Disorder of kidney and ureter, unspecified: Secondary | ICD-10-CM | POA: Insufficient documentation

## 2016-10-29 DIAGNOSIS — I10 Essential (primary) hypertension: Secondary | ICD-10-CM | POA: Insufficient documentation

## 2016-10-29 DIAGNOSIS — M320 Drug-induced systemic lupus erythematosus: Secondary | ICD-10-CM | POA: Diagnosis not present

## 2016-10-29 DIAGNOSIS — D509 Iron deficiency anemia, unspecified: Secondary | ICD-10-CM | POA: Insufficient documentation

## 2016-10-29 DIAGNOSIS — R5381 Other malaise: Secondary | ICD-10-CM | POA: Insufficient documentation

## 2016-10-29 DIAGNOSIS — Z7952 Long term (current) use of systemic steroids: Secondary | ICD-10-CM | POA: Insufficient documentation

## 2016-10-29 DIAGNOSIS — Z8261 Family history of arthritis: Secondary | ICD-10-CM | POA: Insufficient documentation

## 2016-10-29 DIAGNOSIS — F329 Major depressive disorder, single episode, unspecified: Secondary | ICD-10-CM | POA: Insufficient documentation

## 2016-10-29 DIAGNOSIS — M1990 Unspecified osteoarthritis, unspecified site: Secondary | ICD-10-CM | POA: Diagnosis not present

## 2016-10-29 LAB — CBC
HEMATOCRIT: 29.4 % — AB (ref 34.0–46.6)
Hemoglobin: 9.8 g/dL — ABNORMAL LOW (ref 11.1–15.9)
MCH: 23.7 pg — AB (ref 26.6–33.0)
MCHC: 33.3 g/dL (ref 31.5–35.7)
MCV: 71 fL — ABNORMAL LOW (ref 79–97)
Platelets: 282 10*3/uL (ref 150–379)
RBC: 4.13 x10E6/uL (ref 3.77–5.28)
RDW: 20.7 % — ABNORMAL HIGH (ref 12.3–15.4)
WBC: 6.5 10*3/uL (ref 3.4–10.8)

## 2016-10-29 LAB — BASIC METABOLIC PANEL
BUN / CREAT RATIO: 15 (ref 9–23)
BUN: 16 mg/dL (ref 6–20)
CO2: 25 mmol/L (ref 20–29)
CREATININE: 1.06 mg/dL — AB (ref 0.57–1.00)
Calcium: 10 mg/dL (ref 8.7–10.2)
Chloride: 104 mmol/L (ref 96–106)
GFR calc Af Amer: 87 mL/min/{1.73_m2} (ref >59)
GFR, EST NON AFRICAN AMERICAN: 76 mL/min/{1.73_m2} (ref >59)
GLUCOSE: 89 mg/dL (ref 65–99)
Potassium: 4.3 mmol/L (ref 3.5–5.2)
SODIUM: 136 mmol/L (ref 134–144)

## 2016-10-29 NOTE — Telephone Encounter (Signed)
Patient had appointment today (10/29/2016)

## 2016-10-29 NOTE — Telephone Encounter (Signed)
Please let pt know that all labs show improvement including hgb and kidney function. Don't need to check these again for at least another month.  thanks

## 2016-10-29 NOTE — Patient Instructions (Signed)
I WANT TO BE ALWAYS USING YOUR WALKER AT HOME. YOU NEED TO SLOW DOWN!!!!!!   PLEASE FEEL FREE TO CALL OUR OFFICE WITH ANY PROBLEMS OR QUESTIONS [phone])

## 2016-10-29 NOTE — Progress Notes (Signed)
Subjective:    Patient ID: Erika Boyer, female    DOB: 1996/03/19, 21 y.o.   MRN: [id number]  HPI   Gennell is here for a transitional care visit. She has been using her walker at home. She reports no falls at home. She is able to shower and dress on her own. She is pending home health services but states that she is going to do outpt therapies.   She hasn't been checking her bp at home. Her bowels and bladder have been emptying without problems. Her sleep has not been great because she wants her bed against the wall and it's not.   I reviewed all of her medications and she appears to be taking them as instructed.    Pain Inventory Average Pain 0 Pain Right Now 0 My pain is na  In the last 24 hours, has pain interfered with the following? General activity 0 Relation with others 0 Enjoyment of life 0 What TIME of day is your pain at its worst? na Sleep (in general) .  Pain is worse with: na Pain improves with: na Relief from Meds: na  Mobility walk with assistance use a walker ability to climb steps?  yes do you drive?  no  Function disabled: date disabled 2011 I need assistance with the following:  meal prep, household duties and shopping  Neuro/Psych trouble walking dizziness anxiety  Prior Studies Any changes since last visit?  no  Physicians involved in your care Any changes since last visit?  no   Family History  Problem Relation Age of Onset  . Thalassemia Mother   . Arthritis Mother   . High blood pressure Father   . Mental illness Father        question bipolar disorder per wife  . Sickle cell trait Maternal Aunt    Social History   Social History  . Marital status: Single    Spouse name: N/A  . Number of children: N/A  . Years of education: N/A   Social History Main Topics  . Smoking status: Never Smoker  . Smokeless tobacco: Never Used  . Alcohol use No  . Drug use: No  . Sexual activity: Yes   Other Topics Concern  . None    Social History Narrative  . None   Past Surgical History:  Procedure Laterality Date  . PERICARDIOCENTESIS N/A 10/04/2016   Procedure: Pericardiocentesis;  Surgeon: Yates Decamp, MD;  [facility] INVASIVE CV LAB;  Service: Cardiovascular;  Laterality: N/A;  . REDUCTION MAMMAPLASTY Bilateral 2012  . TONSILLECTOMY     Past Medical History:  Diagnosis Date  . Anemia   . Anxiety   . Arthritis    "right knee" (10/11/2016)  . Cerebellar degeneration   . Chronic bronchitis (HCC)   . Daily headache   . Depression   . Graves disease 2012   hx; "was on RX; it disappeared" (10/11/2016)  . History of blood transfusion    "related to the anemia" (10/11/2016)  . Hypertension   . Lupus    "not sure what kind" (10/11/2016)  . Pneumonia 10/03/2016   BP 120/84   Pulse 96   LMP 10/08/2016   SpO2 97%   Opioid Risk Score:   Fall Risk Score:  `1  Depression screen PHQ 2/9  Depression screen PHQ 2/9 08/23/2016  Decreased Interest 2  Down, Depressed, Hopeless 2  PHQ - 2 Score 4  Altered sleeping 1  Tired, decreased energy 2  Change in appetite 2  Feeling bad or failure about yourself  1  Trouble concentrating 1  Moving slowly or fidgety/restless 2  Suicidal thoughts 2  PHQ-9 Score 15    Review of Systems  Constitutional: Positive for diaphoresis and unexpected weight change.  HENT: Negative.   Respiratory: Negative.   Cardiovascular: Negative.   Gastrointestinal: Positive for nausea.  Endocrine: Negative.   Genitourinary: Negative.   Musculoskeletal: Negative.   Skin: Negative.   Allergic/Immunologic: Negative.   Neurological: Negative.   Hematological: Negative.   Psychiatric/Behavioral: Negative.   All other systems reviewed and are negative.      Objective:   Physical Exam  Constitutional: She is oriented to person, place, and time. She appears well-developedand well-nourished. No distress.  HENT:  Head: Normocephalicand atraumatic.  Mouth/Throat: Oropharynx is  clear and moist.  Eyes: EOMI  Neck:  . Neck supple.  Cardiovascular: RRR without murmur. No JVD Respiratory: CTA Bilaterally without wheezes or rales. Normal effort GI: Soft .  Musculoskeletal:  She exhibits no tenderness.  No edema BLE Neurological: She is alertand oriented to person, place, and time. Strength 5/5. Struggles with balance with standing. Ataxic. Moves too fast  Skin: Skin is warmand dry.  Marland Kitchen.  Psychiatric:  cooperative. pleasant      Assessment & Plan:  1. Debility secondary to Sepsis, lupuswith multiple medical issues.  --pt's mother wasn't present  -she remains a high fall risk.   -she needs to use her walker. Reviewed safety with pt today  -made a referral to neuro-rehab 2. Pulmonary: continue nebulizers, IS/FV                       3. Drug induced SLE:   prednisone   20mg  daily   -outpt follow up scheduled with Dr. Nickola MajorHawkes   11. Peripheral edema: Monitor weights daily.  12. Iron deficiency anemia: Continue iron supplement. :  13. Tachycardia: Monitor BP/HR bid --labetalol 14. Hyperthyroid: off methamazole currently. Thyroid studies within normal range 15. ARI: improved off lasix today  16.  HTN: showing improvement.            -continue labetalol 150mg  tid           -continue losartan 50mg  (home medication per mother)--can increase further if needed           --continue  hctz 25mg  daily           -continue daily potassium now that hctz back on board           -discussed dietary choices with patient   FOllow up in 1 month with NP. Thirty minutes of face to face patient care time were spent during this visit. All questions were encouraged and answered.

## 2016-10-30 ENCOUNTER — Encounter: Payer: Self-pay | Admitting: Pulmonary Disease

## 2016-10-30 ENCOUNTER — Ambulatory Visit (INDEPENDENT_AMBULATORY_CARE_PROVIDER_SITE_OTHER)
Admission: RE | Admit: 2016-10-30 | Discharge: 2016-10-30 | Disposition: A | Discharge status: Home / Self Care | Payer: Medicare Other | Source: Ambulatory Visit | Attending: Pulmonary Disease | Admitting: Pulmonary Disease

## 2016-10-30 ENCOUNTER — Ambulatory Visit (INDEPENDENT_AMBULATORY_CARE_PROVIDER_SITE_OTHER): Payer: Medicare Other | Admitting: Pulmonary Disease

## 2016-10-30 VITALS — BP 110/80 | HR 98 | Ht 65.0 in | Wt 245.4 lb

## 2016-10-30 DIAGNOSIS — L930 Discoid lupus erythematosus: Secondary | ICD-10-CM

## 2016-10-30 DIAGNOSIS — M3219 Other organ or system involvement in systemic lupus erythematosus: Secondary | ICD-10-CM

## 2016-10-30 DIAGNOSIS — J90 Pleural effusion, not elsewhere classified: Secondary | ICD-10-CM

## 2016-10-30 DIAGNOSIS — M329 Systemic lupus erythematosus, unspecified: Secondary | ICD-10-CM | POA: Insufficient documentation

## 2016-10-30 NOTE — Assessment & Plan Note (Signed)
She was evaluated during a hospitalization in the spring of 2018 for shortness of breath. She was found to have a pericardial effusion and a rather large left-sided pleural effusion. This is evaluated with a thoracentesis and lab work. Lab work showed a positive ANA as well as a positive double-stranded DNA. 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 Since her hospitalization her symptoms have improved. She still has some findings on physical exam today which are suggestive of a residual pleural effusion.  Plan: Repeat chest x-ray today, if there is still a small residual fluid collection then I will recommend that she be maintained on prednisone for the next 4-6 weeks If the fluid collection has increased and we will arrange for a thoracentesis Follow-up 4-6 weeks

## 2016-10-30 NOTE — Progress Notes (Signed)
Subjective:    Patient ID: Erika Boyer, female    DOB: 01/13/1996, 21 y.o.   MRN: [id number]  Evaluated while hospitalized in the spring of 2018 for shortness of breath. Has lupus and a pleural effusion. Required thoracentesis while hospitalized. This was treated with immunosuppression.  HPI  Chief Complaint  Patient presents with  . Hospitalization Follow-up    Pt was hospitalized on 5/25 for fluid around her heart and lungs. Pt states she has no SOB, chest tightness, or cough since leaving hospital.     Since leaving the hospital she reports feeling OK.  She has had some pain in the left side.  No breathing difficulty, no cough.  She continues to take prednisone under the direction of Dr. Hermelinda MedicusSchwartz.    She has been discharged from the hospital but she sees Dr. Hermelinda MedicusSchwartz regulalry.    She has been walking without difficulty. No cough, no wheezing.  No leg swelling. No fever no chills. She continues to take prednisone daily.  Past Medical History:  Diagnosis Date  . Anemia   . Anxiety   . Arthritis    "right knee" (10/11/2016)  . Cerebellar degeneration   . Chronic bronchitis (HCC)   . Daily headache   . Depression   . Graves disease 2012   hx; "was on RX; it disappeared" (10/11/2016)  . History of blood transfusion    "related to the anemia" (10/11/2016)  . Hypertension   . Lupus    "not sure what kind" (10/11/2016)  . Pneumonia 10/03/2016     Review of Systems  Constitutional: Negative for chills, fatigue and fever.  HENT: Negative for rhinorrhea, sinus pain, sinus pressure and sneezing.   Respiratory: Negative for cough, shortness of breath and wheezing.   Cardiovascular: Negative for chest pain, palpitations and leg swelling.       Objective:   Physical Exam Vitals:   10/30/16 1124  BP: 110/80  Pulse: 98  SpO2: 98%  Weight: 245 lb 6.4 oz (111.3 kg)  Height: 5\' 5"  (1.651 m)   RA  Gen: obese but well appearing HENT: OP clear, TM's clear, neck  supple PULM: slightly diminished left base only B, normal percussion throughout CV: RRR, no mgr, trace edema GI: BS+, soft, nontender Derm: no cyanosis or rash Psyche: normal mood and affect  Labs: ANA positive, DS DNA positive  CBC    Component Value Date/Time   WBC 6.5 10/29/2016 1052   WBC 6.0 10/24/2016 0539   RBC 4.13 10/29/2016 1052   RBC 3.99 10/24/2016 0539   HGB 9.8 (L) 10/29/2016 1052   HCT 29.4 (L) 10/29/2016 1052   PLT 282 10/29/2016 1052   MCV 71 (L) 10/29/2016 1052   MCH 23.7 (L) 10/29/2016 1052   MCH 23.8 (L) 10/24/2016 0539   MCHC 33.3 10/29/2016 1052   MCHC 31.1 10/24/2016 0539   RDW 20.7 (H) 10/29/2016 1052   LYMPHSABS 1.3 10/13/2016 0443   LYMPHSABS 1.1 08/23/2016 1548   MONOABS 1.0 10/13/2016 0443   EOSABS 0.0 10/13/2016 0443   EOSABS 0.1 08/23/2016 1548   BASOSABS 0.0 10/13/2016 0443   BASOSABS 0.0 08/23/2016 1548   Hospital discharge records reviewed were she was evaluated for pleural effusion and pericardial effusion.     Assessment & Plan:  Pleural effusion, left She was evaluated during a hospitalization in the spring of 2018 for shortness of breath. She was found to have a pericardial effusion and a rather large left-sided pleural effusion. This is evaluated with  a thoracentesis and lab work. Lab work showed a positive ANA as well as a positive double-stranded DNA. 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 Since her hospitalization her symptoms have improved. She still has some findings on physical exam today which are suggestive of a residual pleural effusion.  Plan: Repeat chest x-ray today, if there is still a small residual fluid collection then I will recommend that she be maintained on prednisone for the next 4-6 weeks If the fluid collection has increased and we will arrange for a thoracentesis Follow-up 4-6 weeks  SLE (systemic lupus erythematosus) (HCC) Continue prednisone 20 mg daily for  now Make referral to rheumatology    Current Outpatient Prescriptions:  .  albuterol (PROAIR HFA) 108 (90 Base) MCG/ACT inhaler, Inhale 2 puffs into the lungs every 4 (four) hours as needed for wheezing or shortness of breath., Disp: , Rfl:  .  aspirin 81 MG chewable tablet, Chew 1 tablet (81 mg total) by mouth daily., Disp: 30 tablet, Rfl: 1 .  EPINEPHrine 0.3 mg/0.3 mL IJ SOAJ injection, USE AS DIRECTED AND THEN CALL 911, Disp: , Rfl:  .  famotidine (PEPCID) 20 MG tablet, Take 1 tablet (20 mg total) by mouth 2 (two) times daily., Disp: 60 tablet, Rfl: 0 .  hydrochlorothiazide (HYDRODIURIL) 25 MG tablet, Take 1 tablet (25 mg total) by mouth daily., Disp: 30 tablet, Rfl: 0 .  iron polysaccharides (NIFEREX) 150 MG capsule, Take 1 capsule (150 mg total) by mouth 2 (two) times daily before lunch and supper., Disp: 60 capsule, Rfl: 0 .  labetalol (NORMODYNE) 300 MG tablet, Take 0.5 tablets (150 mg total) by mouth 3 (three) times daily., Disp: 90 tablet, Rfl: 0 .  losartan (COZAAR) 50 MG tablet, Take 1 tablet (50 mg total) by mouth daily., Disp: 30 tablet, Rfl: 0 .  multivitamin (PROSIGHT) TABS tablet, Take 1 tablet by mouth daily., Disp: 30 each, Rfl: 0 .  pantoprazole (PROTONIX) 20 MG tablet, Take 1 tablet (20 mg total) by mouth daily., Disp: 30 tablet, Rfl: 1 .  potassium chloride SA (K-DUR,KLOR-CON) 20 MEQ tablet, Take 1 tablet (20 mEq total) by mouth 2 (two) times daily., Disp: 60 tablet, Rfl: 0 .  predniSONE (DELTASONE) 20 MG tablet, Take 1 tablet (20 mg total) by mouth daily with breakfast., Disp: 30 tablet, Rfl: 0 .  prochlorperazine (COMPAZINE) 5 MG tablet, Take 1 tablet (5 mg total) by mouth every 6 (six) hours as needed for nausea or vomiting., Disp: 30 tablet, Rfl: 1

## 2016-10-30 NOTE — Assessment & Plan Note (Signed)
Continue prednisone 20 mg daily for now Make referral to rheumatology

## 2016-10-30 NOTE — Patient Instructions (Signed)
We will check a chest x-ray today to see if there is still fluid in the left lung and then call you with the results If there is still a small amount in my recommendation will be for you to continue prednisone for the next 4-6 weeks and get a repeat chest x-ray at that point If the fluid has increased in size than we will need to perform another thoracentesis We will make arrangements for you to see a rheumatologist for lupus We will see you back in 4-6 weeks with a repeat chest x-ray

## 2016-10-31 DIAGNOSIS — G319 Degenerative disease of nervous system, unspecified: Secondary | ICD-10-CM | POA: Diagnosis not present

## 2016-10-31 DIAGNOSIS — J42 Unspecified chronic bronchitis: Secondary | ICD-10-CM | POA: Diagnosis not present

## 2016-10-31 DIAGNOSIS — F79 Unspecified intellectual disabilities: Secondary | ICD-10-CM | POA: Diagnosis not present

## 2016-10-31 DIAGNOSIS — L930 Discoid lupus erythematosus: Secondary | ICD-10-CM | POA: Diagnosis not present

## 2016-10-31 DIAGNOSIS — F329 Major depressive disorder, single episode, unspecified: Secondary | ICD-10-CM | POA: Diagnosis not present

## 2016-10-31 DIAGNOSIS — I10 Essential (primary) hypertension: Secondary | ICD-10-CM | POA: Diagnosis not present

## 2016-10-31 DIAGNOSIS — F419 Anxiety disorder, unspecified: Secondary | ICD-10-CM | POA: Diagnosis not present

## 2016-10-31 DIAGNOSIS — E0500 Thyrotoxicosis with diffuse goiter without thyrotoxic crisis or storm: Secondary | ICD-10-CM | POA: Diagnosis not present

## 2016-10-31 DIAGNOSIS — D649 Anemia, unspecified: Secondary | ICD-10-CM | POA: Diagnosis not present

## 2016-10-31 DIAGNOSIS — Z8701 Personal history of pneumonia (recurrent): Secondary | ICD-10-CM | POA: Diagnosis not present

## 2016-10-31 NOTE — Progress Notes (Signed)
Left message for patient to contact office.

## 2016-11-01 ENCOUNTER — Ambulatory Visit: Payer: Medicare Other | Attending: Physical Medicine & Rehabilitation | Admitting: Physical Therapy

## 2016-11-01 NOTE — Telephone Encounter (Signed)
Notified her mother.

## 2016-11-02 DIAGNOSIS — J42 Unspecified chronic bronchitis: Secondary | ICD-10-CM | POA: Diagnosis not present

## 2016-11-02 DIAGNOSIS — D649 Anemia, unspecified: Secondary | ICD-10-CM | POA: Diagnosis not present

## 2016-11-02 DIAGNOSIS — L930 Discoid lupus erythematosus: Secondary | ICD-10-CM | POA: Diagnosis not present

## 2016-11-02 DIAGNOSIS — I10 Essential (primary) hypertension: Secondary | ICD-10-CM | POA: Diagnosis not present

## 2016-11-02 DIAGNOSIS — G319 Degenerative disease of nervous system, unspecified: Secondary | ICD-10-CM | POA: Diagnosis not present

## 2016-11-02 DIAGNOSIS — F79 Unspecified intellectual disabilities: Secondary | ICD-10-CM | POA: Diagnosis not present

## 2016-11-05 DIAGNOSIS — I10 Essential (primary) hypertension: Secondary | ICD-10-CM | POA: Diagnosis not present

## 2016-11-05 DIAGNOSIS — D649 Anemia, unspecified: Secondary | ICD-10-CM | POA: Diagnosis not present

## 2016-11-05 DIAGNOSIS — F79 Unspecified intellectual disabilities: Secondary | ICD-10-CM | POA: Diagnosis not present

## 2016-11-05 DIAGNOSIS — J42 Unspecified chronic bronchitis: Secondary | ICD-10-CM | POA: Diagnosis not present

## 2016-11-05 DIAGNOSIS — L930 Discoid lupus erythematosus: Secondary | ICD-10-CM | POA: Diagnosis not present

## 2016-11-05 DIAGNOSIS — G319 Degenerative disease of nervous system, unspecified: Secondary | ICD-10-CM | POA: Diagnosis not present

## 2016-11-06 DIAGNOSIS — L930 Discoid lupus erythematosus: Secondary | ICD-10-CM | POA: Diagnosis not present

## 2016-11-06 DIAGNOSIS — F79 Unspecified intellectual disabilities: Secondary | ICD-10-CM | POA: Diagnosis not present

## 2016-11-06 DIAGNOSIS — D649 Anemia, unspecified: Secondary | ICD-10-CM | POA: Diagnosis not present

## 2016-11-06 DIAGNOSIS — J42 Unspecified chronic bronchitis: Secondary | ICD-10-CM | POA: Diagnosis not present

## 2016-11-06 DIAGNOSIS — G319 Degenerative disease of nervous system, unspecified: Secondary | ICD-10-CM | POA: Diagnosis not present

## 2016-11-06 DIAGNOSIS — I10 Essential (primary) hypertension: Secondary | ICD-10-CM | POA: Diagnosis not present

## 2016-11-08 DIAGNOSIS — J42 Unspecified chronic bronchitis: Secondary | ICD-10-CM | POA: Diagnosis not present

## 2016-11-08 DIAGNOSIS — L930 Discoid lupus erythematosus: Secondary | ICD-10-CM | POA: Diagnosis not present

## 2016-11-08 DIAGNOSIS — I10 Essential (primary) hypertension: Secondary | ICD-10-CM | POA: Diagnosis not present

## 2016-11-08 DIAGNOSIS — F79 Unspecified intellectual disabilities: Secondary | ICD-10-CM | POA: Diagnosis not present

## 2016-11-08 DIAGNOSIS — D649 Anemia, unspecified: Secondary | ICD-10-CM | POA: Diagnosis not present

## 2016-11-08 DIAGNOSIS — G319 Degenerative disease of nervous system, unspecified: Secondary | ICD-10-CM | POA: Diagnosis not present

## 2016-11-12 ENCOUNTER — Ambulatory Visit (INDEPENDENT_AMBULATORY_CARE_PROVIDER_SITE_OTHER): Payer: Medicare Other | Admitting: Neurology

## 2016-11-12 ENCOUNTER — Encounter: Payer: Self-pay | Admitting: Neurology

## 2016-11-12 ENCOUNTER — Telehealth: Payer: Self-pay

## 2016-11-12 VITALS — BP 114/71 | HR 101 | Ht 65.0 in | Wt 256.4 lb

## 2016-11-12 DIAGNOSIS — G319 Degenerative disease of nervous system, unspecified: Secondary | ICD-10-CM | POA: Diagnosis not present

## 2016-11-12 NOTE — Patient Instructions (Addendum)
Remember to drink plenty of fluid, eat healthy meals and do not skip any meals. Try to eat protein with a every meal and eat a healthy snack such as fruit or nuts in between meals. Try to keep a regular sleep-wake schedule and try to exercise daily, particularly in the form of walking, 20-30 minutes a day, if you can.   I would like to see you back in 6 months, sooner if we need to. Please call us with any interim questions, concerns, problems, updates or refill requests.   Our phone number is [phone]. We also have an after hours call service for urgent matters and there is a physician on-call for urgent questions. For any emergencies you know to call 911 or go to the nearest emergency room  Will request records from [facility] in [place] Dr. Joycelyn ManZimmerman

## 2016-11-12 NOTE — Progress Notes (Signed)
[facility]    Provider:  Dr Lucia Gaskins Referring Provider: Loletta Specter, PA-C Primary Care Physician:  Loletta Specter, PA-C  CC:  Cerebellar Degeneration  HPI:  Erika Boyer is a 21 y.o. female here as a referral from Dr. Lily Kocher for cerebellar atrophy. Past medical history of morbid obesity, acute kidney injury, drug-induced lupus erythematosus, cognitive disorder, ligament hypertension, cerebellar atrophy. 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 MRI indicated cerebellar/pontine degeneration. Patient was worked up in the past possibly with genetics not clear from records. Cerebellar degeneration of unknown etiology, multiple needs. She has been evaluated at [facility] in [place]. She was seeing neurology for about 5 years there. They did genetic testing. She was diagnosed with post-viral cerebellar degeneration. She just started falling at the age 21. She last fell a few days ago.  She has physical therapy to her house. Since being out of the hospital she feels better. She is taking a lot of medication. Physical therapy is helping. She feels her balance is better. Her writing is better. She gets tremors, they are off and on. She gets tremors when she tries to concentrate. They are in her head. The tremor does not bother her. It doesn't affect her, she gets double which is chronic, no problems speaking, she speaks slowly. She graduated HS. She didn;t like college.  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 MRI indicated cerebellar/pontine degeneration. Patient was scheduled for genetic testing but no results available. Difficulty  with double vision which lasts a few seconds. Decreased trunk control with body sway, broad-based stance, decreased lordosis.Reviewed MRi report, Somewhat isolated severe cerebellar atrophy similar to 2013.  Potential causes include but not limited to medication/substance toxicitiy, prior cerebellitis, paraneoplastic or other autoimmune disorder, hereditary ataxias, and JC virus related granule cell neuronopathy.   Review of Systems: Patient complains of symptoms per HPI as well as the following symptoms: tremors, double vision. Pertinent negatives and positives per HPI. All others negative.   Social History   Social History  . Marital status: Single    Spouse name: N/A  . Number of children: N/A  . Years of education: N/A   Occupational History  . Not on file.   Social History Main Topics  . Smoking status: Never Smoker  . Smokeless tobacco: Never Used  . Alcohol use No  . Drug use: No  . Sexual activity: Yes   Other Topics Concern  . Not on file   Social History Narrative  . No narrative on file    Family History  Problem Relation Age of Onset  . Thalassemia Mother   . Arthritis Mother   . High blood pressure Father   . Mental illness Father        question bipolar disorder per wife  . Sickle cell trait Maternal Aunt     Past Medical History:  Diagnosis Date  . Anemia   . Anxiety   . Arthritis    "right knee" (10/11/2016)  . Cerebellar degeneration   . Chronic bronchitis (HCC)   . Daily headache   . Depression   . Graves disease 2012   hx; "was on RX; it disappeared" (10/11/2016)  . History  of blood transfusion    "related to the anemia" (10/11/2016)  . Hypertension   . Lupus    "not sure what kind" (10/11/2016)  . Pneumonia 10/03/2016    Past Surgical History:  Procedure Laterality Date  . PERICARDIOCENTESIS N/A 10/04/2016   Procedure: Pericardiocentesis;  Surgeon: Yates Decamp, MD;  Location: [facility] INVASIVE CV LAB;  Service: Cardiovascular;  Laterality:  N/A;  . REDUCTION MAMMAPLASTY Bilateral 2012  . TONSILLECTOMY      Current Outpatient Prescriptions  Medication Sig Dispense Refill  . albuterol (PROAIR HFA) 108 (90 Base) MCG/ACT inhaler Inhale 2 puffs into the lungs every 4 (four) hours as needed for wheezing or shortness of breath.    Marland Kitchen aspirin 81 MG chewable tablet Chew 1 tablet (81 mg total) by mouth daily. 30 tablet 1  . EPINEPHrine 0.3 mg/0.3 mL IJ SOAJ injection USE AS DIRECTED AND THEN CALL 911    . famotidine (PEPCID) 20 MG tablet Take 1 tablet (20 mg total) by mouth 2 (two) times daily. 60 tablet 0  . hydrochlorothiazide (HYDRODIURIL) 25 MG tablet Take 1 tablet (25 mg total) by mouth daily. 30 tablet 0  . iron polysaccharides (NIFEREX) 150 MG capsule Take 1 capsule (150 mg total) by mouth 2 (two) times daily before lunch and supper. 60 capsule 0  . labetalol (NORMODYNE) 300 MG tablet Take 0.5 tablets (150 mg total) by mouth 3 (three) times daily. 90 tablet 0  . losartan (COZAAR) 50 MG tablet Take 1 tablet (50 mg total) by mouth daily. 30 tablet 0  . multivitamin (PROSIGHT) TABS tablet Take 1 tablet by mouth daily. 30 each 0  . pantoprazole (PROTONIX) 20 MG tablet Take 1 tablet (20 mg total) by mouth daily. 30 tablet 1  . potassium chloride SA (K-DUR,KLOR-CON) 20 MEQ tablet Take 1 tablet (20 mEq total) by mouth 2 (two) times daily. 60 tablet 0  . predniSONE (DELTASONE) 20 MG tablet Take 1 tablet (20 mg total) by mouth daily with breakfast. 30 tablet 0  . prochlorperazine (COMPAZINE) 5 MG tablet Take 1 tablet (5 mg total) by mouth every 6 (six) hours as needed for nausea or vomiting. 30 tablet 1   No current facility-administered medications for this visit.     Allergies as of 11/12/2016 - Review Complete 10/30/2016  Allergen Reaction Noted  . Shellfish allergy Anaphylaxis 10/03/2016  . Morphine and related Itching 08/23/2016  . Mushroom extract complex Other (See Comments) 10/03/2016  . Tapazole [methimazole] Swelling and Rash  10/03/2016    Vitals: There were no vitals taken for this visit. Last Weight:  Wt Readings from Last 1 Encounters:  10/30/16 245 lb 6.4 oz (111.3 kg)   Last Height:   Ht Readings from Last 1 Encounters:  10/30/16 5\' 5"  (1.651 m)   Physical exam: Exam: Gen: NAD, conversant, well nourised, obese, well groomed                     CV: RRR, no MRG. No Carotid Bruits. No peripheral edema, warm, nontender Eyes: Conjunctivae clear without exudates or hemorrhage  Neuro: Detailed Neurologic Exam  Speech:    Scanning speech Cognition:    The patient is oriented to person, place, and time;     recent and remote memory impaired ;     language fluent;     Impaired attention, concentration, fund of knowledge Cranial Nerves:    The pupils are equal, round, and reactive to light. The fundi are normal and spontaneous venous pulsations  are present. Visual fields are full to finger confrontation. Extraocular movements are intact. Saccades and end gaze nystagmus. Trigeminal sensation is intact and the muscles of mastication are normal. The face is symmetric. The palate elevates in the midline. Hearing intact. Voice is normal. Shoulder shrug is normal. The tongue has normal motion without fasciculations.   Coordination:    Mild dysmetria FTN   Gait:     Wide based Lumbering gait Can walk on heels and toes with imbalance Cant tandem   Motor Observation:    No asymmetry, no atrophy. Minimal postural tremor Tone:    Normal muscle tone.    Posture:    Posture is normal. normal erect    Strength:    Strength is V/V in the upper and lower limbs.      Sensation: intact to LT     Reflex Exam:  DTR's:    Deep tendon reflexes in the upper and lower extremities are symmetrical bilaterally.   Toes:    The toes are downgoing bilaterally.   Clonus:    Clonus is absent.     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 She was followed by a neurologist for 6 years and we are requesting the records.   Naomie DeanAntonia Yahye Siebert, MD  [facility] [address] [place], [place] [id number]  Phone [phone] Fax [phone]

## 2016-11-12 NOTE — Telephone Encounter (Signed)
Records release form completed, signed by pt and sent to medical records for request of results from [facility] in [place], Dr. Joycelyn ManZimmerman.

## 2016-11-13 ENCOUNTER — Encounter (HOSPITAL_COMMUNITY): Payer: Self-pay | Admitting: Emergency Medicine

## 2016-11-13 ENCOUNTER — Ambulatory Visit (HOSPITAL_COMMUNITY)
Admission: EM | Admit: 2016-11-13 | Discharge: 2016-11-13 | Disposition: A | Discharge status: Home / Self Care | Payer: Medicare Other | Attending: Family Medicine | Admitting: Family Medicine

## 2016-11-13 ENCOUNTER — Encounter (INDEPENDENT_AMBULATORY_CARE_PROVIDER_SITE_OTHER): Payer: Self-pay | Admitting: Physician Assistant

## 2016-11-13 ENCOUNTER — Ambulatory Visit (INDEPENDENT_AMBULATORY_CARE_PROVIDER_SITE_OTHER): Payer: Medicare Other | Admitting: Physician Assistant

## 2016-11-13 ENCOUNTER — Telehealth: Payer: Self-pay | Admitting: *Deleted

## 2016-11-13 VITALS — BP 129/83 | HR 85 | Temp 98.2°F | Wt 256.4 lb

## 2016-11-13 DIAGNOSIS — W19XXXA Unspecified fall, initial encounter: Secondary | ICD-10-CM | POA: Diagnosis not present

## 2016-11-13 DIAGNOSIS — S9031XA Contusion of right foot, initial encounter: Secondary | ICD-10-CM | POA: Diagnosis not present

## 2016-11-13 DIAGNOSIS — M79671 Pain in right foot: Secondary | ICD-10-CM | POA: Diagnosis not present

## 2016-11-13 DIAGNOSIS — D649 Anemia, unspecified: Secondary | ICD-10-CM | POA: Diagnosis not present

## 2016-11-13 DIAGNOSIS — L930 Discoid lupus erythematosus: Secondary | ICD-10-CM | POA: Diagnosis not present

## 2016-11-13 DIAGNOSIS — N179 Acute kidney failure, unspecified: Secondary | ICD-10-CM

## 2016-11-13 DIAGNOSIS — F79 Unspecified intellectual disabilities: Secondary | ICD-10-CM | POA: Diagnosis not present

## 2016-11-13 DIAGNOSIS — I10 Essential (primary) hypertension: Secondary | ICD-10-CM | POA: Diagnosis not present

## 2016-11-13 DIAGNOSIS — G319 Degenerative disease of nervous system, unspecified: Secondary | ICD-10-CM | POA: Diagnosis not present

## 2016-11-13 DIAGNOSIS — E0590 Thyrotoxicosis, unspecified without thyrotoxic crisis or storm: Secondary | ICD-10-CM | POA: Diagnosis not present

## 2016-11-13 DIAGNOSIS — J42 Unspecified chronic bronchitis: Secondary | ICD-10-CM | POA: Diagnosis not present

## 2016-11-13 MED ORDER — DICLOFENAC SODIUM 50 MG PO TBEC: 50.0000 mg | DELAYED_RELEASE_TABLET | Freq: Two times a day (BID) | ORAL | 0 refills | Status: DC

## 2016-11-13 NOTE — Patient Instructions (Signed)
Hyperthyroidism Hyperthyroidism is when the thyroid is too active (overactive). Your thyroid is a large gland that is located in your neck. The thyroid helps to control how your body uses food (metabolism). When your thyroid is overactive, it produces too much of a hormone called thyroxine. What are the causes? Causes of hyperthyroidism may include:  Graves disease. This is when your immune system attacks the thyroid gland. This is the most common cause.  Inflammation of the thyroid gland.  Tumor in the thyroid gland or somewhere else.  Excessive use of thyroid medicines, including: ? Prescription thyroid supplement. ? Herbal supplements that mimic thyroid hormones.  Solid or fluid-filled lumps within your thyroid gland (thyroid nodules).  Excessive ingestion of iodine.  What increases the risk?  Being female.  Having a family history of thyroid conditions. What are the signs or symptoms? Signs and symptoms of hyperthyroidism may include:  Nervousness.  Inability to tolerate heat.  Unexplained weight loss.  Diarrhea.  Change in the texture of hair or skin.  Heart skipping beats or making extra beats.  Rapid heart rate.  Loss of menstruation.  Shaky hands.  Fatigue.  Restlessness.  Increased appetite.  Sleep problems.  Enlarged thyroid gland or nodules.  How is this diagnosed? Diagnosis of hyperthyroidism may include:  Medical history and physical exam.  Blood tests.  Ultrasound tests.  How is this treated? Treatment may include:  Medicines to control your thyroid.  Surgery to remove your thyroid.  Radiation therapy.  Follow these instructions at home:  Take medicines only as directed by your health care provider.  Do not use any tobacco products, including cigarettes, chewing tobacco, or electronic cigarettes. If you need help quitting, ask your health care provider.  Do not exercise or do physical activity until your health care provider  approves.  Keep all follow-up appointments as directed by your health care provider. This is important. Contact a health care provider if:  Your symptoms do not get better with treatment.  You have fever.  You are taking thyroid replacement medicine and you: ? Have depression. ? Feel mentally and physically slow. ? Have weight gain. Get help right away if:  You have decreased alertness or a change in your awareness.  You have abdominal pain.  You feel dizzy.  You have a rapid heartbeat.  You have an irregular heartbeat. This information is not intended to replace advice given to you by your health care provider. Make sure you discuss any questions you have with your health care provider. Document Released: 05/07/2005 Document Revised: 10/06/2015 Document Reviewed: 09/22/2013 Elsevier Interactive Patient Education  2017 [employer]vier Inc. Anemia, Nonspecific Anemia is a condition in which the concentration of red blood cells or hemoglobin in the blood is below normal. Hemoglobin is a substance in red blood cells that carries oxygen to the tissues of the body. Anemia results in not enough oxygen reaching these tissues. What are the causes? Common causes of anemia include:  Excessive bleeding. Bleeding may be internal or external. This includes excessive bleeding from periods (in women) or from the intestine.  Poor nutrition.  Chronic kidney, thyroid, and liver disease.  Bone marrow disorders that decrease red blood cell production.  Cancer and treatments for cancer.  HIV, AIDS, and their treatments.  Spleen problems that increase red blood cell destruction.  Blood disorders.  Excess destruction of red blood cells due to infection, medicines, and autoimmune disorders.  What are the signs or symptoms?  Minor weakness.  Dizziness.  Headache.  Palpitations.  Shortness of breath, especially with exercise.  Paleness.  Cold  sensitivity.  Indigestion.  Nausea.  Difficulty sleeping.  Difficulty concentrating. Symptoms may occur suddenly or they may develop slowly. How is this diagnosed? Additional blood tests are often needed. These help your health care provider determine the best treatment. Your health care provider will check your stool for blood and look for other causes of blood loss. How is this treated? Treatment varies depending on the cause of the anemia. Treatment can include:  Supplements of iron, vitamin B12, or folic acid.  Hormone medicines.  A blood transfusion. This may be needed if blood loss is severe.  Hospitalization. This may be needed if there is significant continual blood loss.  Dietary changes.  Spleen removal.  Follow these instructions at home: Keep all follow-up appointments. It often takes many weeks to correct anemia, and having your health care provider check on your condition and your response to treatment is very important. Get help right away if:  You develop extreme weakness, shortness of breath, or chest pain.  You become dizzy or have trouble concentrating.  You develop heavy vaginal bleeding.  You develop a rash.  You have bloody or black, tarry stools.  You faint.  You vomit up blood.  You vomit repeatedly.  You have abdominal pain.  You have a fever or persistent symptoms for more than 2-3 days.  You have a fever and your symptoms suddenly get worse.  You are dehydrated. This information is not intended to replace advice given to you by your health care provider. Make sure you discuss any questions you have with your health care provider. Document Released: 06/14/2004 Document Revised: 10/19/2015 Document Reviewed: 10/31/2012 Elsevier Interactive Patient Education  2017 [employer].

## 2016-11-13 NOTE — Telephone Encounter (Addendum)
Pt request faxed to Dr Joycelyn ManZimmerman requesting office notes, labs, all imaging report. Request pt Cd. HIM dept [phone] X-ray dept [phone]  R/c notes from  [facility] [place].Rc pt imaging report and Cd. Pt Cd on [place] desk.

## 2016-11-13 NOTE — ED Triage Notes (Signed)
[facility] a [facility] right foot pain secondary to a fall last night.

## 2016-11-13 NOTE — Progress Notes (Signed)
Subjective:  Patient ID: Erika Boyer, female    DOB: 1995-08-22  Age: 21 y.o. MRN: [id number]  CC: hospital f/u  HPI Erika Boyer is a 21 y.o. female with a PMH of anxiety, depression, anemia, Graves Disease, HTN, Lupus, and Cerebellar degeneration presents on hospital admission f/u. Was admitted from 10/12/16 to 10/25/16. Diagnosed with Pleural effusion bilaterally 2/2 CAP and/or SLE, AKI, Malignant HTN, drug induced lupus erythematosus, debility, morbid obesity, anemia, and cognitive disorder. 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 Not currently taking methimazole and does not have endocrinology f/u. She is interested in thyroidectomy. Denies SOB, malaise, fatigue, CP, HA, abdominal pain, f/c/n/v, rash, or GI/GU sxs.    Patient states she fell last night while getting into bed. She missed the bed and fell on the floor hurting her right foot. Pain currently on the MTP of right hallux to the dorsal aspect of mid foot. Patent states there is weight bearing pain. Believes she fractured her toe. No swelling, redness, or ecchymosis.      ROS Review of Systems  Constitutional: Negative for chills, fever and malaise/fatigue.  Eyes: Negative for blurred vision.  Respiratory: Negative for shortness of breath.   Cardiovascular: Negative for chest pain and palpitations.  Gastrointestinal: Negative for abdominal pain and nausea.  Genitourinary: Negative for dysuria and hematuria.  Musculoskeletal: Positive for joint pain. Negative for myalgias.  Skin: Negative for rash.  Neurological: Negative for tingling and headaches.  Psychiatric/Behavioral: Negative for depression. The patient is not nervous/anxious.     Objective:  BP 129/83 (BP Location: Left Arm, Patient Position: Sitting, Cuff Size: Normal)   Pulse 85   Temp 98.2 F (36.8 C) (Oral)   Wt 256 lb 6.4 oz (116.3 kg)   LMP 10/16/2016 (Approximate)   SpO2 97%   BMI 42.67 kg/m   BP/Weight 11/13/2016 11/12/2016  10/30/2016  Systolic BP 129 114 110  Diastolic BP 83 71 80  Wt. (Lbs) 256.4 256.4 245.4  BMI 42.67 42.67 40.84      Physical Exam  Constitutional: She is oriented to person, place, and time.  Well developed, obese, NAD, polite  HENT:  Head: Normocephalic and atraumatic.  Eyes: No scleral icterus.  Neck: Normal range of motion. Neck supple. No thyromegaly present.  Cardiovascular: Normal rate, regular rhythm and normal heart sounds.   Pulmonary/Chest: Effort normal and breath sounds normal.  Abdominal: Soft. Bowel sounds are normal. There is no tenderness.  Musculoskeletal: She exhibits no edema.  Right foot without deformity, ecchymosis, edema, or erythema. Mild TTP over the dorsal aspect of the MTP of the right hallux and over the forefoot.  Neurological: She is alert and oriented to person, place, and time. No cranial nerve deficit. Coordination normal.  Gait normal  Skin: Skin is warm and dry. No rash noted. No erythema. No pallor.  Psychiatric: She has a normal mood and affect. Her behavior is normal. Thought content normal.  Mild cognitive difficulty  Vitals reviewed.    Assessment & Plan:   1. Anemia, unspecified type - CBC with Differential  2. AKI (acute kidney injury) (HCC) - Basic Metabolic Panel  3. Hyperthyroidism - Ambulatory referral to Endocrinology. Pt interested in thyroidectomy. She is thought to have had drug induced Lupus that led to pleural effusions.  4. Right foot pain - DG Foot Complete Right; Future   No orders of the defined types were placed in this encounter.   Follow-up: Return in about 2 months (around 01/13/2017) for throid panel.  Loletta Specteroger David Amiya Escamilla PA

## 2016-11-13 NOTE — ED Provider Notes (Signed)
[facility]    CSN: [id number] Arrival date & time: 11/13/16  1624     History   Chief Complaint Chief Complaint  Patient presents with  . Fall    HPI Erika Boyer is a 21 y.o. female.   The patient presented to the [facility] with a complaint of right foot pain secondary to a fall last night.  She struck the top of the foot as it bent backwards (plantar flexed).  Patient told me she is not experiencing pain at present.  It had been hurting earlier.      Past Medical History:  Diagnosis Date  . Anemia   . Anxiety   . Arthritis    "right knee" (10/11/2016)  . Cerebellar degeneration   . Chronic bronchitis (HCC)   . Daily headache   . Depression   . Graves disease 2012   hx; "was on RX; it disappeared" (10/11/2016)  . History of blood transfusion    "related to the anemia" (10/11/2016)  . Hypertension   . Lupus    "not sure what kind" (10/11/2016)  . Pneumonia 10/03/2016    Patient Active Problem List   Diagnosis Date Noted  . SLE (systemic lupus erythematosus) (HCC) 10/30/2016  . Malignant hypertension 10/18/2016  . Cognitive disorder   . Debility 10/12/2016  . Leg swelling   . Shortness of breath   . Cerebral atrophy   . MR (mental retardation)   . Drug-induced lupus erythematosus   . Tachycardia   . Hypokalemia   . Acute blood loss anemia   . S/P thoracentesis   . Sepsis (HCC) 10/03/2016  . Acute respiratory failure with hypoxia (HCC) 10/03/2016  . CAP (community acquired pneumonia) 10/03/2016  . Hyperthyroidism 10/03/2016  . Morbid obesity with BMI of 40.0-44.9, adult (HCC) 10/03/2016  . Acute kidney injury (HCC) 10/03/2016  . 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 Microcytic anemia 10/03/2016  . Cardiomegaly 10/03/2016  . Acute respiratory failure (HCC) 10/03/2016  . Lobar pneumonia (HCC)   . Pleural effusion   . Pleural effusion, left   . Graves disease 08/23/2016    Past Surgical History:  Procedure Laterality Date  .  PERICARDIOCENTESIS N/A 10/04/2016   Procedure: Pericardiocentesis;  Surgeon: Yates Decamp, MD;  Location: [facility] INVASIVE CV LAB;  Service: Cardiovascular;  Laterality: N/A;  . REDUCTION MAMMAPLASTY Bilateral 2012  . TONSILLECTOMY      OB History    No data available       Home Medications    Prior to Admission medications   Medication Sig Start Date End Date Taking? Authorizing Provider  albuterol (PROAIR HFA) 108 (90 Base) MCG/ACT inhaler Inhale 2 puffs into the lungs every 4 (four) hours as needed for wheezing or shortness of breath.    [provider]  aspirin 81 MG chewable tablet Chew 1 tablet (81 mg total) by mouth daily. 10/11/16   Richarda Overlie, MD  diclofenac (VOLTAREN) 50 MG EC tablet Take 1 tablet (50 mg total) by mouth 2 (two) times daily. 11/13/16   Elvina Sidle, MD  EPINEPHrine 0.3 mg/0.3 mL IJ SOAJ injection USE AS DIRECTED AND THEN CALL 911 09/08/15   [provider]  famotidine (PEPCID) 20 MG tablet Take 1 tablet (20 mg total) by mouth 2 (two) times daily. 10/25/16   Love, Evlyn Kanner, PA-C  hydrochlorothiazide (HYDRODIURIL) 25 MG tablet Take 1 tablet (25 mg total) by mouth daily. 10/26/16   Love, Evlyn Kanner, PA-C  iron polysaccharides (NIFEREX) 150 MG capsule Take 1  capsule (150 mg total) by mouth 2 (two) times daily before lunch and supper. 10/25/16   Love, Evlyn Kanner, PA-C  labetalol (NORMODYNE) 300 MG tablet Take 0.5 tablets (150 mg total) by mouth 3 (three) times daily. 10/25/16   Love, Evlyn Kanner, PA-C  losartan (COZAAR) 50 MG tablet Take 1 tablet (50 mg total) by mouth daily. 10/26/16   Love, Evlyn Kanner, PA-C  multivitamin (PROSIGHT) TABS tablet Take 1 tablet by mouth daily. 10/26/16   Love, Evlyn Kanner, PA-C  pantoprazole (PROTONIX) 20 MG tablet Take 1 tablet (20 mg total) by mouth daily. 10/25/16 10/25/17  Love, Evlyn Kanner, PA-C  potassium chloride SA (K-DUR,KLOR-CON) 20 MEQ tablet Take 1 tablet (20 mEq total) by mouth 2 (two) times daily. 10/25/16   Love, Evlyn Kanner, PA-C  predniSONE  (DELTASONE) 20 MG tablet Take 1 tablet (20 mg total) by mouth daily with breakfast. 10/26/16   Love, Evlyn Kanner, PA-C  prochlorperazine (COMPAZINE) 5 MG tablet Take 1 tablet (5 mg total) by mouth every 6 (six) hours as needed for nausea or vomiting. 10/25/16 10/25/17  Jacquelynn Cree, PA-C    Family History Family History  Problem Relation Age of Onset  . Thalassemia Mother   . Arthritis Mother   . High blood pressure Father   . Mental illness Father        question bipolar disorder per wife  . Sickle cell trait Maternal Aunt   . Cancer Maternal Grandmother        Bone  . Multiple sclerosis Cousin     Social History Social History  Substance Use Topics  . Smoking status: Never Smoker  . Smokeless tobacco: Never Used  . Alcohol use No     Allergies   Shellfish allergy; Morphine and related; Mushroom extract complex; and Tapazole [methimazole]   Review of Systems Review of Systems  All other systems reviewed and are negative.    Physical Exam Triage Vital Signs ED Triage Vitals [11/13/16 1638]  Enc Vitals Group     BP 120/82     Pulse Rate 89     Resp 18     Temp 99.1 F (37.3 C)     Temp Source Oral     SpO2 98 %     Weight      Height      Head Circumference      Peak Flow      Pain Score 7     Pain Loc      Pain Edu?      Excl. in GC?    No data found.   Updated Vital Signs BP 120/82 (BP Location: Left Arm)   Pulse 89   Temp 99.1 F (37.3 C) (Oral)   Resp 18   LMP 10/16/2016 (Approximate)   SpO2 98%    Physical Exam  Constitutional: She is oriented to person, place, and time. She appears well-developed and well-nourished.  HENT:  Right Ear: External ear normal.  Left Ear: External ear normal.  Mouth/Throat: Oropharynx is clear and moist.  Eyes: Conjunctivae are normal. Pupils are equal, round, and reactive to light.  Neck: Normal range of motion. Neck supple.  Pulmonary/Chest: Effort normal.  Musculoskeletal: Normal range of motion. She exhibits  no edema, tenderness or deformity.  Neurological: She is alert and oriented to person, place, and time.  Skin: Skin is warm and dry.  Nursing note and vitals reviewed.    UC Treatments / Results  Labs (all labs ordered are  listed, but only abnormal results are displayed) Labs Reviewed - No data to display  EKG  EKG Interpretation None       Radiology No results found.  Procedures Procedures (including critical care time)  Medications Ordered in UC Medications - No data to display   Initial Impression / Assessment and Plan / UC Course  I have reviewed the triage vital signs and the nursing notes.  Pertinent labs & imaging results that were available during my care of the patient were reviewed by me and considered in my medical decision making (see chart for details).     Final Clinical Impressions(s) / UC Diagnoses   Final diagnoses:  Contusion of right foot, initial encounter    New Prescriptions New Prescriptions   DICLOFENAC (VOLTAREN) 50 MG EC TABLET    Take 1 tablet (50 mg total) by mouth 2 (two) times daily.     Elvina SidleLauenstein, Joey Hudock, MD 11/13/16 [phone]

## 2016-11-14 DIAGNOSIS — F79 Unspecified intellectual disabilities: Secondary | ICD-10-CM | POA: Diagnosis not present

## 2016-11-14 DIAGNOSIS — I10 Essential (primary) hypertension: Secondary | ICD-10-CM | POA: Diagnosis not present

## 2016-11-14 DIAGNOSIS — J42 Unspecified chronic bronchitis: Secondary | ICD-10-CM | POA: Diagnosis not present

## 2016-11-14 DIAGNOSIS — D649 Anemia, unspecified: Secondary | ICD-10-CM | POA: Diagnosis not present

## 2016-11-14 DIAGNOSIS — G319 Degenerative disease of nervous system, unspecified: Secondary | ICD-10-CM | POA: Diagnosis not present

## 2016-11-14 DIAGNOSIS — L930 Discoid lupus erythematosus: Secondary | ICD-10-CM | POA: Diagnosis not present

## 2016-11-14 LAB — BASIC METABOLIC PANEL
BUN/Creatinine Ratio: 24 — ABNORMAL HIGH (ref 9–23)
BUN: 18 mg/dL (ref 6–20)
CALCIUM: 9.2 mg/dL (ref 8.7–10.2)
CHLORIDE: 104 mmol/L (ref 96–106)
CO2: 22 mmol/L (ref 20–29)
Creatinine, Ser: 0.74 mg/dL (ref 0.57–1.00)
GFR calc Af Amer: 135 mL/min/{1.73_m2} (ref >59)
GFR calc non Af Amer: 117 mL/min/{1.73_m2} (ref >59)
GLUCOSE: 104 mg/dL — AB (ref 65–99)
POTASSIUM: 4.4 mmol/L (ref 3.5–5.2)
SODIUM: 138 mmol/L (ref 134–144)

## 2016-11-14 LAB — CBC WITH DIFFERENTIAL/PLATELET
BASOS ABS: 0 10*3/uL (ref 0.0–0.2)
Basos: 0 %
EOS (ABSOLUTE): 0 10*3/uL (ref 0.0–0.4)
EOS: 0 %
Hematocrit: 30.7 % — ABNORMAL LOW (ref 34.0–46.6)
Hemoglobin: 9.6 g/dL — ABNORMAL LOW (ref 11.1–15.9)
IMMATURE GRANULOCYTES: 0 %
Immature Grans (Abs): 0 10*3/uL (ref 0.0–0.1)
LYMPHS ABS: 0.6 10*3/uL — AB (ref 0.7–3.1)
Lymphs: 7 %
MCH: 23.9 pg — ABNORMAL LOW (ref 26.6–33.0)
MCHC: 31.3 g/dL — AB (ref 31.5–35.7)
MCV: 76 fL — ABNORMAL LOW (ref 79–97)
MONOCYTES: 3 %
MONOS ABS: 0.3 10*3/uL (ref 0.1–0.9)
NEUTROS PCT: 90 %
Neutrophils Absolute: 7.8 10*3/uL — ABNORMAL HIGH (ref 1.4–7.0)
Platelets: 304 10*3/uL (ref 150–379)
RBC: 4.02 x10E6/uL (ref 3.77–5.28)
RDW: 20.3 % — AB (ref 12.3–15.4)
WBC: 8.6 10*3/uL (ref 3.4–10.8)

## 2016-11-14 NOTE — Telephone Encounter (Signed)
Requested records received and included clinic note for f/u of cerebellar dysfunction, [facility] labs from 2016/17 w/ results in normal limits and MRI results from 08/2015 which showed "somewhat isolated severe cerebellar atrophy similar to 2013." Sent to med records for scanning, copy to Dr. Lucia GaskinsAhern for review.

## 2016-11-15 DIAGNOSIS — I10 Essential (primary) hypertension: Secondary | ICD-10-CM | POA: Diagnosis not present

## 2016-11-15 DIAGNOSIS — D649 Anemia, unspecified: Secondary | ICD-10-CM | POA: Diagnosis not present

## 2016-11-15 DIAGNOSIS — G319 Degenerative disease of nervous system, unspecified: Secondary | ICD-10-CM | POA: Diagnosis not present

## 2016-11-15 DIAGNOSIS — L930 Discoid lupus erythematosus: Secondary | ICD-10-CM | POA: Diagnosis not present

## 2016-11-15 DIAGNOSIS — F79 Unspecified intellectual disabilities: Secondary | ICD-10-CM | POA: Diagnosis not present

## 2016-11-15 DIAGNOSIS — J42 Unspecified chronic bronchitis: Secondary | ICD-10-CM | POA: Diagnosis not present

## 2016-11-16 DIAGNOSIS — G319 Degenerative disease of nervous system, unspecified: Secondary | ICD-10-CM | POA: Diagnosis not present

## 2016-11-16 DIAGNOSIS — Z6841 Body Mass Index (BMI) 40.0 and over, adult: Secondary | ICD-10-CM | POA: Diagnosis not present

## 2016-11-16 DIAGNOSIS — D649 Anemia, unspecified: Secondary | ICD-10-CM | POA: Diagnosis not present

## 2016-11-16 DIAGNOSIS — I10 Essential (primary) hypertension: Secondary | ICD-10-CM | POA: Diagnosis not present

## 2016-11-16 DIAGNOSIS — J42 Unspecified chronic bronchitis: Secondary | ICD-10-CM | POA: Diagnosis not present

## 2016-11-16 DIAGNOSIS — F79 Unspecified intellectual disabilities: Secondary | ICD-10-CM | POA: Diagnosis not present

## 2016-11-16 DIAGNOSIS — R5383 Other fatigue: Secondary | ICD-10-CM | POA: Diagnosis not present

## 2016-11-16 DIAGNOSIS — L930 Discoid lupus erythematosus: Secondary | ICD-10-CM | POA: Diagnosis not present

## 2016-11-16 DIAGNOSIS — J90 Pleural effusion, not elsewhere classified: Secondary | ICD-10-CM | POA: Diagnosis not present

## 2016-11-16 DIAGNOSIS — R768 Other specified abnormal immunological findings in serum: Secondary | ICD-10-CM | POA: Diagnosis not present

## 2016-11-16 DIAGNOSIS — E0500 Thyrotoxicosis with diffuse goiter without thyrotoxic crisis or storm: Secondary | ICD-10-CM | POA: Diagnosis not present

## 2016-11-16 DIAGNOSIS — I313 Pericardial effusion (noninflammatory): Secondary | ICD-10-CM | POA: Diagnosis not present

## 2016-11-16 DIAGNOSIS — D508 Other iron deficiency anemias: Secondary | ICD-10-CM | POA: Diagnosis not present

## 2016-11-18 NOTE — Progress Notes (Signed)
Patient ID: Erika Boyer, female   DOB: Dec 10, 1995, 20 y.o.   MRN: [id number]                                                                                                               Reason for Appointment:  Hyperthyroidism, new consultation  Referring physician:   Chief complaint:   History of Present Illness:   The patient was found to have Hyperthyroidism in 03/2015 At that time she had symptoms of palpitations, shakiness, feeling excessively warm and sweaty, irregular menstrual cycles and fatigue. She also had some rapid weight loss and changes in appetite but apparently her weight was fluctuating   Initial evaluation showed her free T4 to be mildly increased at 1.5, high; however in 12/16 her free T4 was 2.4 and 05/2015 Rate was 3.7 along with a total T3 of 494 Details of her initial treatment and office notes from [place] are not available Patient thinks she was started on methimazole and was given a maximum dose of 30 mg Subsequently her free T4 was below normal in 5/17, normal in 1/18 and again low and 0.8 subsequently in 2/18 before she moved here; not clear what doses she was taking but was finally taking 15 mg methimazole  She was seen by a primary care physician in April and her dose was reduced to 10 mg at that time  About 6 months or so ago she was starting to have hives and joint pains which got progressively worse She was also having generalized swelling and especially swelling of her legs She was admitted to the hospital in 5/18 and was told to have lupus and started on prednisone 50 mg, methimazole was stopped  Recently the patient thinks she is getting somewhat shaky again but does not have any heat intolerance or palpitations Occasionally at night she will feel warm but subsequently will get cold She does not feel unusually tired Her weight has gone up because of taking prednisone  Her labs showed her TSH to be back to normal on 10/19/16  Wt Readings  from Last 3 Encounters:  11/19/16 254 lb 12.8 oz (115.6 kg)  11/13/16 256 lb 6.4 oz (116.3 kg)  11/12/16 256 lb 6.4 oz (116.3 kg)     Thyroid function tests as follows:     Lab Results  Component Value Date   FREET4 0.95 10/19/2016   FREET4 0.91 10/03/2016   T3FREE 2.1 10/19/2016   T3FREE 1.4 (L) 10/03/2016   TSH 1.769 10/19/2016   TSH 7.093 (H) 10/03/2016   TSH 4.710 (H) 08/23/2016    No results found for: THYROTRECAB   Allergies as of 11/19/2016      Reactions   Shellfish Allergy Anaphylaxis   Morphine And Related Itching   Mushroom Extract Complex Other (See Comments)   Mother is allergic, so patient avoids these   Tapazole [methimazole] Swelling, Rash   WELTS (also) Mother suspects this MAY be an allergy, as the onset of symptoms coincided with this being started  Medication List       Accurate as of 11/19/16  2:29 PM. Always use your most recent med list.          aspirin 81 MG chewable tablet Chew 1 tablet (81 mg total) by mouth daily.   diclofenac 50 MG EC tablet Commonly known as:  VOLTAREN Take 1 tablet (50 mg total) by mouth 2 (two) times daily.   EPINEPHrine 0.3 mg/0.3 mL Soaj injection Commonly known as:  EPI-PEN USE AS DIRECTED AND THEN CALL 911   famotidine 20 MG tablet Commonly known as:  PEPCID Take 1 tablet (20 mg total) by mouth 2 (two) times daily.   hydrochlorothiazide 25 MG tablet Commonly known as:  HYDRODIURIL Take 1 tablet (25 mg total) by mouth daily.   iron polysaccharides 150 MG capsule Commonly known as:  NIFEREX Take 1 capsule (150 mg total) by mouth 2 (two) times daily before lunch and supper.   losartan 50 MG tablet Commonly known as:  COZAAR Take 1 tablet (50 mg total) by mouth daily.   multivitamin Tabs tablet Take 1 tablet by mouth daily.   pantoprazole 20 MG tablet Commonly known as:  PROTONIX Take 1 tablet (20 mg total) by mouth daily.   potassium chloride SA 20 MEQ tablet Commonly known as:   K-DUR,KLOR-CON Take 1 tablet (20 mEq total) by mouth 2 (two) times daily.   predniSONE 20 MG tablet Commonly known as:  DELTASONE Take 1 tablet (20 mg total) by mouth daily with breakfast.   PROAIR HFA 108 (90 Base) MCG/ACT inhaler Generic drug:  albuterol Inhale 2 puffs into the lungs every 4 (four) hours as needed for wheezing or shortness of breath.   prochlorperazine 5 MG tablet Commonly known as:  COMPAZINE Take 1 tablet (5 mg total) by mouth every 6 (six) hours as needed for nausea or vomiting.           Past Medical History:  Diagnosis Date  . Anemia   . Anxiety   . Arthritis    "right knee" (10/11/2016)  . Cerebellar degeneration   . Chronic bronchitis (HCC)   . Daily headache   . Depression   . Graves disease 2012   hx; "was on RX; it disappeared" (10/11/2016)  . History of blood transfusion    "related to the anemia" (10/11/2016)  . Hypertension   . Lupus    "not sure what kind" (10/11/2016)  . Pneumonia 10/03/2016    Past Surgical History:  Procedure Laterality Date  . PERICARDIOCENTESIS N/A 10/04/2016   Procedure: Pericardiocentesis;  Surgeon: Yates Decamp, MD;  Location: [facility] INVASIVE CV LAB;  Service: Cardiovascular;  Laterality: N/A;  . REDUCTION MAMMAPLASTY Bilateral 2012  . TONSILLECTOMY      Family History  Problem Relation Age of Onset  . Thalassemia Mother   . Arthritis Mother   . High blood pressure Father   . Mental illness Father        question bipolar disorder per wife  . Sickle cell trait Maternal Aunt   . Cancer Maternal Grandmother        Bone  . Multiple sclerosis Cousin     Social History:  reports that she has never smoked. She has never used smokeless tobacco. She reports that she does not drink alcohol or use drugs.  Allergies:  Allergies  Allergen Reactions  . Shellfish Allergy Anaphylaxis  . Morphine And Related Itching  . Mushroom Extract Complex Other (See Comments)    Mother is allergic, so  patient avoids these  .  Tapazole [Methimazole] Swelling and Rash    WELTS (also) Mother suspects this MAY be an allergy, as the onset of symptoms coincided with this being started     Review of Systems  Constitutional: Positive for weight gain. Negative for reduced appetite.  HENT: Negative for trouble swallowing.   Eyes: Negative for blurred vision.  Respiratory: Negative for shortness of breath.   Cardiovascular: Negative for palpitations and leg swelling.  Gastrointestinal: Negative for diarrhea and abdominal pain.  Endocrine: Positive for menstrual changes. Negative for general weakness.       She will sometimes feel excessively warm but other times may feel cold  Musculoskeletal: Negative for joint pain.  Skin:       No history of acne or hirsutism  Neurological: Positive for tremors.       Examination:   BP 108/68   Pulse 98   Ht 5\' 5"  (1.651 m)   Wt 254 lb 12.8 oz (115.6 kg)   SpO2 98%   BMI 42.40 kg/m    General Appearance:  She has generalized obesity, she is pleasant, not anxious or hyperkinetic.         Eyes: No excessive prominence, lid lag or stare present. No swelling of the eyelids or chemosis   Neck: The thyroid is enlargedabout 1.5  times normal, smooth,soft,  non-tender and diffuse.  There is no lymphadenopathy in the neck .           Heart: normal S1 and S2, no murmurs .          Lungs: breath sounds are clear but somewhat diminished on the left base with accompanying dullness Abdomen: no hepatosplenomegaly or other palpable abnormality  Extremities: hands are normally warm. No ankle edema.  Neurological:  Fine tremors are present  On the right only. Deep tendon reflexes at ankles are brisk, unable to elicit them at the biceps.  Skin: No rash, abnormal thickening of the skin on the lower legs seen     Assessment/Plan:   Hyperthyroidism, Likely to be from Graves' disease    Discussed with the patient the hyperthyroidism is a result of an autoimmune condition involving  the thyroid.  Explained that the options for treatment are basically antithyroid drugs and radioactive iodine.  Not clear at this time whether she is in remission from her hyperthyroidism since she has required reduced doses of the medications and her thyroid level was still normal about a month ago just after stopping her methimazole She reportedly had an allergic reaction to methimazole but not clear if this has any connection with her Lupus  On exam she does have a small goiter and mild hyperreflexia but no other confirmatory signs of hyperthyroidism  Today we will check her thyroid levels including free T3 and also check thyrotropin receptor antibody Further management will be decided on lab results Discussed radioactive iodine treatment and given her a handout on this in case he decides to do it  [facility] Sec[facility] Community HospitalKUMAR,Erika Boyer 11/19/2016, 2:29 PM    Note: This office note was prepared with Dragon voice recognition system technology. Any transcriptional errors that result from this process are unintentional.

## 2016-11-19 ENCOUNTER — Encounter: Payer: Self-pay | Admitting: Endocrinology

## 2016-11-19 ENCOUNTER — Ambulatory Visit (INDEPENDENT_AMBULATORY_CARE_PROVIDER_SITE_OTHER): Payer: Medicare Other | Admitting: Endocrinology

## 2016-11-19 ENCOUNTER — Telehealth (INDEPENDENT_AMBULATORY_CARE_PROVIDER_SITE_OTHER): Payer: Self-pay | Admitting: Physician Assistant

## 2016-11-19 ENCOUNTER — Other Ambulatory Visit (INDEPENDENT_AMBULATORY_CARE_PROVIDER_SITE_OTHER): Payer: Self-pay | Admitting: Physician Assistant

## 2016-11-19 VITALS — BP 108/68 | HR 98 | Ht 65.0 in | Wt 254.8 lb

## 2016-11-19 DIAGNOSIS — E0590 Thyrotoxicosis, unspecified without thyrotoxic crisis or storm: Secondary | ICD-10-CM

## 2016-11-19 DIAGNOSIS — Z760 Encounter for issue of repeat prescription: Secondary | ICD-10-CM

## 2016-11-19 MED ORDER — LOSARTAN POTASSIUM 50 MG PO TABS: 50.0000 mg | ORAL_TABLET | Freq: Every day | ORAL | 0 refills | Status: DC

## 2016-11-19 MED ORDER — POTASSIUM CHLORIDE CRYS ER 20 MEQ PO TBCR: 20.0000 meq | EXTENDED_RELEASE_TABLET | Freq: Two times a day (BID) | ORAL | 0 refills | Status: DC

## 2016-11-19 MED ORDER — HYDROCHLOROTHIAZIDE 25 MG PO TABS: 25.0000 mg | ORAL_TABLET | Freq: Every day | ORAL | 0 refills | Status: DC

## 2016-11-19 MED ORDER — POLYSACCHARIDE IRON COMPLEX 150 MG PO CAPS: 150.0000 mg | ORAL_CAPSULE | Freq: Two times a day (BID) | ORAL | 0 refills | Status: DC

## 2016-11-19 NOTE — Telephone Encounter (Signed)
Mrs. Harrold Donathziokhai, patient's mother called left voicemail stated needs refills for Ahyana.  Requesting  hydrochlorothiazide   potassium chloride SA (K-DUR,KLOR-CON) 20   losartan (COZAAR) 50 MG tablet   Iron pills. Please send to :  [facility] [phone] - [place], [place] - [address] AT [facility] [address]:

## 2016-11-19 NOTE — Telephone Encounter (Signed)
Refills sent

## 2016-11-19 NOTE — Telephone Encounter (Signed)
FWD to PCP. Erika Boyer, CMA  

## 2016-11-20 ENCOUNTER — Telehealth: Payer: Self-pay

## 2016-11-20 DIAGNOSIS — L930 Discoid lupus erythematosus: Secondary | ICD-10-CM | POA: Diagnosis not present

## 2016-11-20 DIAGNOSIS — I10 Essential (primary) hypertension: Secondary | ICD-10-CM | POA: Diagnosis not present

## 2016-11-20 DIAGNOSIS — G319 Degenerative disease of nervous system, unspecified: Secondary | ICD-10-CM | POA: Diagnosis not present

## 2016-11-20 DIAGNOSIS — J42 Unspecified chronic bronchitis: Secondary | ICD-10-CM | POA: Diagnosis not present

## 2016-11-20 DIAGNOSIS — F79 Unspecified intellectual disabilities: Secondary | ICD-10-CM | POA: Diagnosis not present

## 2016-11-20 DIAGNOSIS — D649 Anemia, unspecified: Secondary | ICD-10-CM | POA: Diagnosis not present

## 2016-11-20 NOTE — Telephone Encounter (Signed)
Natalia LeatherwoodKatherine OT with [facility]  has called requesting verbal orders for patient to continue OT. Returned [employer] phone call, Natalia LeatherwoodKatherine message was not clear and asked Natalia LeatherwoodKatherine to return phone call to clarify what it is she is requesting.

## 2016-11-21 DIAGNOSIS — D649 Anemia, unspecified: Secondary | ICD-10-CM | POA: Diagnosis not present

## 2016-11-21 DIAGNOSIS — L930 Discoid lupus erythematosus: Secondary | ICD-10-CM | POA: Diagnosis not present

## 2016-11-21 DIAGNOSIS — G319 Degenerative disease of nervous system, unspecified: Secondary | ICD-10-CM | POA: Diagnosis not present

## 2016-11-21 DIAGNOSIS — J42 Unspecified chronic bronchitis: Secondary | ICD-10-CM | POA: Diagnosis not present

## 2016-11-21 DIAGNOSIS — I10 Essential (primary) hypertension: Secondary | ICD-10-CM | POA: Diagnosis not present

## 2016-11-21 DIAGNOSIS — F79 Unspecified intellectual disabilities: Secondary | ICD-10-CM | POA: Diagnosis not present

## 2016-11-22 DIAGNOSIS — J42 Unspecified chronic bronchitis: Secondary | ICD-10-CM | POA: Diagnosis not present

## 2016-11-22 DIAGNOSIS — D649 Anemia, unspecified: Secondary | ICD-10-CM | POA: Diagnosis not present

## 2016-11-22 DIAGNOSIS — G319 Degenerative disease of nervous system, unspecified: Secondary | ICD-10-CM | POA: Diagnosis not present

## 2016-11-22 DIAGNOSIS — I10 Essential (primary) hypertension: Secondary | ICD-10-CM | POA: Diagnosis not present

## 2016-11-22 DIAGNOSIS — F79 Unspecified intellectual disabilities: Secondary | ICD-10-CM | POA: Diagnosis not present

## 2016-11-22 DIAGNOSIS — L930 Discoid lupus erythematosus: Secondary | ICD-10-CM | POA: Diagnosis not present

## 2016-11-22 NOTE — Telephone Encounter (Signed)
Imaging CD placed in Dr. Trevor MaceAhern's Inbox for her review.

## 2016-11-22 NOTE — Telephone Encounter (Signed)
FWD to PCP. Tempestt S Roberts, CMA  

## 2016-11-26 DIAGNOSIS — G319 Degenerative disease of nervous system, unspecified: Secondary | ICD-10-CM | POA: Diagnosis not present

## 2016-11-26 DIAGNOSIS — E0500 Thyrotoxicosis with diffuse goiter without thyrotoxic crisis or storm: Secondary | ICD-10-CM | POA: Diagnosis not present

## 2016-11-26 DIAGNOSIS — I10 Essential (primary) hypertension: Secondary | ICD-10-CM | POA: Diagnosis not present

## 2016-11-26 DIAGNOSIS — Z8701 Personal history of pneumonia (recurrent): Secondary | ICD-10-CM | POA: Diagnosis not present

## 2016-11-26 DIAGNOSIS — J42 Unspecified chronic bronchitis: Secondary | ICD-10-CM | POA: Diagnosis not present

## 2016-11-26 DIAGNOSIS — F419 Anxiety disorder, unspecified: Secondary | ICD-10-CM | POA: Diagnosis not present

## 2016-11-26 DIAGNOSIS — F79 Unspecified intellectual disabilities: Secondary | ICD-10-CM | POA: Diagnosis not present

## 2016-11-26 DIAGNOSIS — D649 Anemia, unspecified: Secondary | ICD-10-CM | POA: Diagnosis not present

## 2016-11-26 DIAGNOSIS — L930 Discoid lupus erythematosus: Secondary | ICD-10-CM | POA: Diagnosis not present

## 2016-11-26 DIAGNOSIS — F329 Major depressive disorder, single episode, unspecified: Secondary | ICD-10-CM | POA: Diagnosis not present

## 2016-11-30 DIAGNOSIS — L930 Discoid lupus erythematosus: Secondary | ICD-10-CM | POA: Diagnosis not present

## 2016-11-30 DIAGNOSIS — I10 Essential (primary) hypertension: Secondary | ICD-10-CM | POA: Diagnosis not present

## 2016-11-30 DIAGNOSIS — F79 Unspecified intellectual disabilities: Secondary | ICD-10-CM | POA: Diagnosis not present

## 2016-11-30 DIAGNOSIS — D649 Anemia, unspecified: Secondary | ICD-10-CM | POA: Diagnosis not present

## 2016-11-30 DIAGNOSIS — G319 Degenerative disease of nervous system, unspecified: Secondary | ICD-10-CM | POA: Diagnosis not present

## 2016-11-30 DIAGNOSIS — J42 Unspecified chronic bronchitis: Secondary | ICD-10-CM | POA: Diagnosis not present

## 2016-12-03 ENCOUNTER — Encounter: Payer: Self-pay | Admitting: Registered Nurse

## 2016-12-03 ENCOUNTER — Encounter: Payer: Medicare Other | Attending: Physical Medicine & Rehabilitation | Admitting: Registered Nurse

## 2016-12-03 VITALS — BP 113/80 | HR 89

## 2016-12-03 DIAGNOSIS — Z79899 Other long term (current) drug therapy: Secondary | ICD-10-CM | POA: Diagnosis not present

## 2016-12-03 DIAGNOSIS — Z8249 Family history of ischemic heart disease and other diseases of the circulatory system: Secondary | ICD-10-CM | POA: Insufficient documentation

## 2016-12-03 DIAGNOSIS — Z8261 Family history of arthritis: Secondary | ICD-10-CM | POA: Insufficient documentation

## 2016-12-03 DIAGNOSIS — L932 Other local lupus erythematosus: Secondary | ICD-10-CM

## 2016-12-03 DIAGNOSIS — F329 Major depressive disorder, single episode, unspecified: Secondary | ICD-10-CM | POA: Diagnosis not present

## 2016-12-03 DIAGNOSIS — N289 Disorder of kidney and ureter, unspecified: Secondary | ICD-10-CM | POA: Diagnosis not present

## 2016-12-03 DIAGNOSIS — F3289 Other specified depressive episodes: Secondary | ICD-10-CM | POA: Diagnosis not present

## 2016-12-03 DIAGNOSIS — G319 Degenerative disease of nervous system, unspecified: Secondary | ICD-10-CM

## 2016-12-03 DIAGNOSIS — Z638 Other specified problems related to primary support group: Secondary | ICD-10-CM | POA: Diagnosis not present

## 2016-12-03 DIAGNOSIS — Z8701 Personal history of pneumonia (recurrent): Secondary | ICD-10-CM | POA: Insufficient documentation

## 2016-12-03 DIAGNOSIS — F419 Anxiety disorder, unspecified: Secondary | ICD-10-CM | POA: Insufficient documentation

## 2016-12-03 DIAGNOSIS — M1990 Unspecified osteoarthritis, unspecified site: Secondary | ICD-10-CM | POA: Diagnosis not present

## 2016-12-03 DIAGNOSIS — R5381 Other malaise: Secondary | ICD-10-CM | POA: Diagnosis not present

## 2016-12-03 DIAGNOSIS — R600 Localized edema: Secondary | ICD-10-CM | POA: Diagnosis not present

## 2016-12-03 DIAGNOSIS — D509 Iron deficiency anemia, unspecified: Secondary | ICD-10-CM | POA: Insufficient documentation

## 2016-12-03 DIAGNOSIS — I10 Essential (primary) hypertension: Secondary | ICD-10-CM | POA: Insufficient documentation

## 2016-12-03 DIAGNOSIS — Z7952 Long term (current) use of systemic steroids: Secondary | ICD-10-CM | POA: Diagnosis not present

## 2016-12-03 DIAGNOSIS — M320 Drug-induced systemic lupus erythematosus: Secondary | ICD-10-CM | POA: Insufficient documentation

## 2016-12-03 DIAGNOSIS — R000 Tachycardia, unspecified: Secondary | ICD-10-CM | POA: Diagnosis not present

## 2016-12-03 DIAGNOSIS — Z818 Family history of other mental and behavioral disorders: Secondary | ICD-10-CM | POA: Insufficient documentation

## 2016-12-03 DIAGNOSIS — T50905A Adverse effect of unspecified drugs, medicaments and biological substances, initial encounter: Secondary | ICD-10-CM

## 2016-12-03 NOTE — Progress Notes (Signed)
Subjective:    Patient ID: Erika Boyer, female    DOB: 1995/09/07, 21 y.o.   MRN: [id number]  HPI: Erika Boyer is a 21 year old female who returns for follow up appointment . She states her pain is located in her left side. She wrote her average pain was 3 on History and Physical. [name] states she stays in her room, we discussed depression. She states she is not depressed, offered counseling referral she refused. Erika Boyer in room, they bickered between one another the entire visit. I had to interrupt their converstaion and asked if they would consider family counseling, they both refused. It looks they have many social issues that will have to be worked which has impacted Erika Boyer.    Pain Inventory Average Pain 3 Pain Right Now . My pain is sharp  In the last 24 hours, has pain interfered with the following? General activity 5 Relation with others 5 Enjoyment of life 5 What TIME of day is your pain at its worst? . Sleep (in general) .  Pain is worse with: bending and unsure Pain improves with: . Relief from Meds: 0  Mobility walk without assistance ability to climb steps?  yes do you drive?  yes  Function disabled: date disabled .  Neuro/Psych No problems in this area  Prior Studies .  Physicians involved in your care .   Family History  Problem Relation Age of Onset  . Thalassemia Erika Boyer   . Arthritis Erika Boyer   . High blood pressure Father   . Mental illness Father        question bipolar disorder per wife  . Sickle cell trait Maternal Aunt   . Cancer Maternal Grandmother        Bone  . Multiple sclerosis Cousin    Social History   Social History  . Marital status: Single    Spouse name: N/A  . Number of children: N/A  . Years of education: N/A   Social History Main Topics  . Smoking status: Never Smoker  . Smokeless tobacco: Never Used  . Alcohol use No  . Drug use: No  . Sexual activity: Yes   Other Topics Concern  . None     Social History Narrative   Lives at home w/ her mom   Right-handed   Caffeine: occasional coffee   Past Surgical History:  Procedure Laterality Date  . PERICARDIOCENTESIS N/A 10/04/2016   Procedure: Pericardiocentesis;  Surgeon: Yates Decamp, MD;  Location: [facility] INVASIVE CV LAB;  Service: Cardiovascular;  Laterality: N/A;  . REDUCTION MAMMAPLASTY Bilateral 2012  . TONSILLECTOMY     Past Medical History:  Diagnosis Date  . Anemia   . Anxiety   . Arthritis    "right knee" (10/11/2016)  . Cerebellar degeneration   . Chronic bronchitis (HCC)   . Daily headache   . Depression   . Graves disease 2012   hx; "was on RX; it disappeared" (10/11/2016)  . History of blood transfusion    "related to the anemia" (10/11/2016)  . Hypertension   . Lupus    "not sure what kind" (10/11/2016)  . Pneumonia 10/03/2016   BP 113/80   Pulse 89   SpO2 99%   Opioid Risk Score:   Fall Risk Score:  `1  Depression screen PHQ 2/9  Depression screen [facility] 2/9 12/03/2016 11/13/2016 08/23/2016  Decreased Interest 0 0 2  Down, Depressed, Hopeless 0 0 2  PHQ - 2 Score 0 0 4  Altered sleeping - - 1  Tired, decreased energy - - 2  Change in appetite - - 2  Feeling bad or failure about yourself  - - 1  Trouble concentrating - - 1  Moving slowly or fidgety/restless - - 2  Suicidal thoughts - - 2  PHQ-9 Score - - 15    Review of Systems  Constitutional: Positive for unexpected weight change.  HENT: Negative.   Eyes: Negative.   Respiratory: Negative.   Cardiovascular: Negative.   Gastrointestinal: Positive for nausea.  Endocrine: Negative.   Genitourinary: Negative.   Musculoskeletal: Negative.   Allergic/Immunologic: Negative.   Neurological: Negative.   Hematological: Negative.   Psychiatric/Behavioral: Negative.        Objective:   Physical Exam  Constitutional: She is oriented to person, place, and time. She appears well-developed and well-nourished.  HENT:  Head: Normocephalic and  atraumatic.  Neck: Normal range of motion. Neck supple.  Cardiovascular: Normal rate and regular rhythm.   Pulmonary/Chest: Effort normal and breath sounds normal.  Musculoskeletal:  Normal Muscle Bulk and Muscle Testing Reveals: Upper Extremities: Full ROM and Muscle Strength 5/5 Lower Extremities: Full ROM and Muscle Strength 5/5 Arises from table with ease Narrow Based gait  Neurological: She is alert and oriented to person, place, and time.  Skin: Skin is warm and dry.  Psychiatric: She has a normal mood and affect.  Nursing note and vitals reviewed.         Assessment & Plan:  1. Debility secondary to Sepsis, lupuswith multiple medical issues.: Continue HEP, Erika Boyer Presen 2. Drug induced SLE: Continue Prednisone   20mg  daily: Rheumatology Following 3. Iron Deficiency Anemia: Continue Iron Supplement 4. Family Conflict: RX: Need Counseling: Refuses Referral 5. Depression: Refuses Counseling  30  minutes of face to face patient care time was spent during this visit. All questions were encouraged and answered.  Spent over 30 minutes talking with Erika Boyer and her Erika Boyer regarding medical. Social and family issues.   F/U in 2 months

## 2016-12-18 ENCOUNTER — Encounter: Payer: Self-pay | Admitting: Pulmonary Disease

## 2016-12-18 ENCOUNTER — Ambulatory Visit (INDEPENDENT_AMBULATORY_CARE_PROVIDER_SITE_OTHER): Payer: Medicare Other | Admitting: Pulmonary Disease

## 2016-12-18 ENCOUNTER — Ambulatory Visit (INDEPENDENT_AMBULATORY_CARE_PROVIDER_SITE_OTHER)
Admission: RE | Admit: 2016-12-18 | Discharge: 2016-12-18 | Disposition: A | Discharge status: Home / Self Care | Payer: Medicare Other | Source: Ambulatory Visit | Attending: Pulmonary Disease | Admitting: Pulmonary Disease

## 2016-12-18 VITALS — BP 118/72 | HR 101 | Ht 65.0 in | Wt 250.0 lb

## 2016-12-18 DIAGNOSIS — J90 Pleural effusion, not elsewhere classified: Secondary | ICD-10-CM

## 2016-12-18 DIAGNOSIS — R079 Chest pain, unspecified: Secondary | ICD-10-CM | POA: Diagnosis not present

## 2016-12-18 NOTE — Patient Instructions (Addendum)
For your lupus related pleural effusion: We will get a chest x-ray today and call you with the results of the chest x-ray is normal then you don't need to follow-up with us again unless you have another lung problem  We will see you back on an as-needed basis

## 2016-12-18 NOTE — Progress Notes (Signed)
Subjective:    Patient ID: Erika Boyer, female    DOB: [birth year], 21 y.o.   MRN: [id number]  Evaluated while hospitalized in the spring of 2018 for shortness of breath. Has lupus and a pleural effusion. Required thoracentesis while hospitalized. This was treated with immunosuppression.  HPI  Chief Complaint  Patient presents with  . Follow-up    Pt c/o L sided chest discomfort with yawning.  Per pt, she is not taking any medications at this time.    Since the last visit she had resolution of her right-sided pleural effusion and she is currently not taking any medications.  Erika Boyer says that she has some dyspnea nd her lungs feel tight when she yawns and she feels some pressure like pain when this happens.  No cough or wheeze.  She has lost about 6 pounds, hasn't tried to lose weight.  [place] in [place], but she can't remember the physician's name.  No join pain.  No unexplained rash.     Past Medical History:  Diagnosis Date  . Anemia   . Anxiety   . Arthritis    "right knee" (10/11/2016)  . Cerebellar degeneration   . Chronic bronchitis (HCC)   . Daily headache   . Depression   . Graves disease 2012   hx; "was on RX; it disappeared" (10/11/2016)  . History of blood transfusion    "related to the anemia" (10/11/2016)  . Hypertension   . Lupus    "not sure what kind" (10/11/2016)  . Pneumonia 10/03/2016     Review of Systems  Constitutional: Negative for chills, fatigue and fever.  HENT: Negative for rhinorrhea, sinus pain, sinus pressure and sneezing.   Respiratory: Negative for cough, shortness of breath and wheezing.   Cardiovascular: Negative for chest pain, palpitations and leg swelling.       Objective:   Physical Exam Vitals:   12/18/16 1436  BP: 118/72  Pulse: (!) 101  SpO2: 98%  Weight: 250 lb (113.4 kg)  Height: 5\' 5"  (1.651 m)   RA  Gen: well appearing HENT: OP clear, TM's clear, neck supple PULM: CTA B, normal  percussion CV: RRR, no mgr, trace edema GI: BS+, soft, nontender Derm: no cyanosis or rash Psyche: normal mood and affect   Labs: ANA positive, DS DNA positive  CBC    Component Value Date/Time   WBC 8.6 11/13/2016 1553   WBC 6.0 10/24/2016 0539   RBC 4.02 11/13/2016 1553   RBC 3.99 10/24/2016 0539   HGB 9.6 (L) 11/13/2016 1553   HCT 30.7 (L) 11/13/2016 1553   PLT 304 11/13/2016 1553   MCV 76 (L) 11/13/2016 1553   MCH 23.9 (L) 11/13/2016 1553   MCH 23.8 (L) 10/24/2016 0539   MCHC 31.3 (L) 11/13/2016 1553   MCHC 31.1 10/24/2016 0539   RDW 20.3 (H) 11/13/2016 1553   LYMPHSABS 0.6 (L) 11/13/2016 1553   MONOABS 1.0 10/13/2016 0443   EOSABS 0.0 11/13/2016 1553   BASOSABS 0.0 11/13/2016 1553   Hospital discharge records reviewed were she was evaluated for pleural effusion and pericardial effusion.     Assessment & Plan:  Pleural effusion - Plan: DG Chest 2 View   Discussion: This is been a stable interval for her. She does complain of some chest tightness intermittently but today her oxygenation is normal her lungs are clear to auscultation and she has no objective findings of an underlying lung problem. We will get a chest x-ray to make  sure that the effusion has not recurred. If the effusion has recurred and we will need to restart prednisone.  Plan: For your lupus related pleural effusion: We will get a chest x-ray today and call you with the results of the chest x-ray is normal then you don't need to follow-up with us again unless you have another lung problem  We will see you back on an as-needed basis  Current Outpatient Prescriptions:  .  albuterol (PROAIR HFA) 108 (90 Base) MCG/ACT inhaler, Inhale 2 puffs into the lungs every 4 (four) hours as needed for wheezing or shortness of breath., Disp: , Rfl:  .  diclofenac (VOLTAREN) 50 MG EC tablet, Take 1 tablet (50 mg total) by mouth 2 (two) times daily. (Patient not taking: Reported on 12/18/2016), Disp: 6 tablet, Rfl:  0 .  EPINEPHrine 0.3 mg/0.3 mL IJ SOAJ injection, USE AS DIRECTED AND THEN CALL 911, Disp: , Rfl:  .  famotidine (PEPCID) 20 MG tablet, Take 1 tablet (20 mg total) by mouth 2 (two) times daily. (Patient not taking: Reported on 12/18/2016), Disp: 60 tablet, Rfl: 0 .  hydrochlorothiazide (HYDRODIURIL) 25 MG tablet, TAKE 1 TABLET(25 MG) BY MOUTH DAILY (Patient not taking: Reported on 12/18/2016), Disp: 90 tablet, Rfl: 0 .  iron polysaccharides (NIFEREX) 150 MG capsule, Take 1 capsule (150 mg total) by mouth 2 (two) times daily before lunch and supper. (Patient not taking: Reported on 12/18/2016), Disp: 60 capsule, Rfl: 0 .  losartan (COZAAR) 50 MG tablet, TAKE 1 TABLET(50 MG) BY MOUTH DAILY (Patient not taking: Reported on 12/18/2016), Disp: 90 tablet, Rfl: 0 .  multivitamin (PROSIGHT) TABS tablet, Take 1 tablet by mouth daily. (Patient not taking: Reported on 12/18/2016), Disp: 30 each, Rfl: 0 .  pantoprazole (PROTONIX) 20 MG tablet, Take 1 tablet (20 mg total) by mouth daily. (Patient not taking: Reported on 12/18/2016), Disp: 30 tablet, Rfl: 1 .  potassium chloride SA (K-DUR,KLOR-CON) 20 MEQ tablet, TAKE 1 TABLET(20 MEQ) BY MOUTH TWICE DAILY (Patient not taking: Reported on 12/18/2016), Disp: 180 tablet, Rfl: 0

## 2016-12-27 ENCOUNTER — Other Ambulatory Visit (INDEPENDENT_AMBULATORY_CARE_PROVIDER_SITE_OTHER): Payer: Medicare Other

## 2016-12-27 DIAGNOSIS — E0590 Thyrotoxicosis, unspecified without thyrotoxic crisis or storm: Secondary | ICD-10-CM

## 2016-12-27 LAB — T3, FREE: T3 FREE: 3.1 pg/mL (ref 2.3–4.2)

## 2016-12-27 LAB — TSH: TSH: 0.88 u[IU]/mL (ref 0.35–4.50)

## 2016-12-27 LAB — T4, FREE: FREE T4: 0.98 ng/dL (ref 0.60–1.60)

## 2016-12-28 LAB — THYROTROPIN RECEPTOR AUTOABS: Thyrotropin Receptor Ab: 1.7 IU/L (ref 0.00–1.75)

## 2016-12-31 NOTE — Progress Notes (Signed)
Patient ID: Erika Boyer, female   DOB: 09-Dec-1995, 21 y.o.   MRN: [id number]                                                                                                               Reason for Appointment:  Hyperthyroidism, follow-up visit    Chief complaint:   History of Present Illness:   The patient was found to have Hyperthyroidism in 03/2015 At that time she had symptoms of palpitations, shakiness, feeling excessively warm and sweaty, irregular menstrual cycles and fatigue. She also had some rapid weight loss and changes in appetite but apparently her weight was fluctuating   Initial evaluation showed her free T4 to be mildly increased at 1.5, high; however in 12/16 her free T4 was 2.4 and 05/2015 Rate was 3.7 along with a total T3 of 494 Details of her initial treatment and office notes from [place] are not available Patient thinks she was started on methimazole and was given a maximum dose of 30 mg Subsequently her free T4 was below normal in 5/17, normal in 1/18 and again low and 0.8 subsequently in 2/18 before she moved here; not clear what doses she was taking but was finally taking 15 mg methimazole  She was seen by a primary care physician in April and her dose was reduced to 10 mg at that time She was admitted to the hospital in 5/18 and was told to have lupus and started on prednisone 50 mg, methimazole was stopped  She was seen in initial consultation in 7/18 when she was complaining of feeling shaky but no palpitations, unusual fatigue or heat intolerance She had gained weight from prednisone However she did not go to the lab as directed until last week for thyroid levels  Her labs now show that her thyroid levels are consistently normal Thyrotropin receptor antibody is upper normal   Wt Readings from Last 3 Encounters:  01/01/17 253 lb 6.4 oz (114.9 kg)  12/18/16 250 lb (113.4 kg)  11/19/16 254 lb 12.8 oz (115.6 kg)     Thyroid function tests as  follows:     Lab Results  Component Value Date   FREET4 0.98 12/27/2016   FREET4 0.95 10/19/2016   FREET4 0.91 10/03/2016   T3FREE 3.1 12/27/2016   T3FREE 2.1 10/19/2016   T3FREE 1.4 (L) 10/03/2016   TSH 0.88 12/27/2016   TSH 1.769 10/19/2016   TSH 7.093 (H) 10/03/2016    Lab Results  Component Value Date   THYROTRECAB 1.70 12/27/2016     Allergies as of 01/01/2017      Reactions   Shellfish Allergy Anaphylaxis   Morphine And Related Itching   Mushroom Extract Complex Other (See Comments)   Mother is allergic, so patient avoids these   Tapazole [methimazole] Swelling, Rash   WELTS (also) Mother suspects this MAY be an allergy, as the onset of symptoms coincided with this being started      Medication List       Accurate as of 01/01/17  2:50  PM. Always use your most recent med list.          diclofenac 50 MG EC tablet Commonly known as:  VOLTAREN Take 1 tablet (50 mg total) by mouth 2 (two) times daily.   EPINEPHrine 0.3 mg/0.3 mL Soaj injection Commonly known as:  EPI-PEN USE AS DIRECTED AND THEN CALL 911   famotidine 20 MG tablet Commonly known as:  PEPCID Take 1 tablet (20 mg total) by mouth 2 (two) times daily.   hydrochlorothiazide 25 MG tablet Commonly known as:  HYDRODIURIL TAKE 1 TABLET(25 MG) BY MOUTH DAILY   iron polysaccharides 150 MG capsule Commonly known as:  NIFEREX Take 1 capsule (150 mg total) by mouth 2 (two) times daily before lunch and supper.   losartan 50 MG tablet Commonly known as:  COZAAR TAKE 1 TABLET(50 MG) BY MOUTH DAILY   multivitamin Tabs tablet Take 1 tablet by mouth daily.   pantoprazole 20 MG tablet Commonly known as:  PROTONIX Take 1 tablet (20 mg total) by mouth daily.   potassium chloride SA 20 MEQ tablet Commonly known as:  K-DUR,KLOR-CON TAKE 1 TABLET(20 MEQ) BY MOUTH TWICE DAILY   PROAIR HFA 108 (90 Base) MCG/ACT inhaler Generic drug:  albuterol Inhale 2 puffs into the lungs every 4 (four) hours as needed  for wheezing or shortness of breath.           Past Medical History:  Diagnosis Date  . Anemia   . Anxiety   . Arthritis    "right knee" (10/11/2016)  . Cerebellar degeneration   . Chronic bronchitis (HCC)   . Daily headache   . Depression   . Graves disease 2012   hx; "was on RX; it disappeared" (10/11/2016)  . History of blood transfusion    "related to the anemia" (10/11/2016)  . Hypertension   . Lupus    "not sure what kind" (10/11/2016)  . Pneumonia 10/03/2016    Past Surgical History:  Procedure Laterality Date  . PERICARDIOCENTESIS N/A 10/04/2016   Procedure: Pericardiocentesis;  Surgeon: Yates Decamp, MD;  Location: [facility] INVASIVE CV LAB;  Service: Cardiovascular;  Laterality: N/A;  . REDUCTION MAMMAPLASTY Bilateral 2012  . TONSILLECTOMY      Family History  Problem Relation Age of Onset  . Thalassemia Mother   . Arthritis Mother   . High blood pressure Father   . Mental illness Father        question bipolar disorder per wife  . Sickle cell trait Maternal Aunt   . Cancer Maternal Grandmother        Bone  . Multiple sclerosis Cousin     Social History:  reports that she has never smoked. She has never used smokeless tobacco. She reports that she does not drink alcohol or use drugs.  Allergies:  Allergies  Allergen Reactions  . Shellfish Allergy Anaphylaxis  . Morphine And Related Itching  . Mushroom Extract Complex Other (See Comments)    Mother is allergic, so patient avoids these  . Tapazole [Methimazole] Swelling and Rash    WELTS (also) Mother suspects this MAY be an allergy, as the onset of symptoms coincided with this being started     Review of Systems   She is complaining of pain in various locations including back and chest Currently not taking any medications, previously was on losartan for hypertension Not complaining of any palpitations today    Examination:   BP 118/76   Pulse (!) 120   Ht 5\' 5"  (1.651  m)   Wt 253 lb 6.4 oz (114.9 kg)    LMP 12/12/2016   SpO2 97%   BMI 42.17 kg/m     She has generalized obesity, she is pleasant, not anxious or hyperkinetic.     The thyroid is enlarged about 1.5  times normal, smooth,soft and diffuse.   No tremors are present  Biceps reflexes appear normal    Assessment/Plan:   Hyperthyroidism, appears to be in remission now  Likely to be from Graves' disease    Her thyroid levels have been consistently normal and has had no recurrence for 3 months after stopping her methimazole She has still a persistent mild goiter She has upper normal level of thyrotropin receptor antibody  Will need to have her follow-up in 6 months unless she has any recurrence of the symptoms that she had in 2016, have advised her what symptoms to look for  She will need to follow-up with her PCP for other issues  Most likely her sinus tachycardia today is related to pain and anxiety  Total visit time 15 minutes  Elonzo Sopp 01/01/2017, 2:50 PM    Note: This office note was prepared with Dragon voice recognition system technology. Any transcriptional errors that result from this process are unintentional.

## 2017-01-01 ENCOUNTER — Ambulatory Visit (INDEPENDENT_AMBULATORY_CARE_PROVIDER_SITE_OTHER): Payer: Medicare Other | Admitting: Endocrinology

## 2017-01-01 ENCOUNTER — Encounter: Payer: Self-pay | Admitting: Endocrinology

## 2017-01-01 VITALS — BP 118/76 | HR 120 | Ht 65.0 in | Wt 253.4 lb

## 2017-01-01 DIAGNOSIS — R000 Tachycardia, unspecified: Secondary | ICD-10-CM

## 2017-01-01 DIAGNOSIS — Z8639 Personal history of other endocrine, nutritional and metabolic disease: Secondary | ICD-10-CM

## 2017-01-08 ENCOUNTER — Encounter (INDEPENDENT_AMBULATORY_CARE_PROVIDER_SITE_OTHER): Payer: Self-pay | Admitting: Physician Assistant

## 2017-01-08 ENCOUNTER — Ambulatory Visit (INDEPENDENT_AMBULATORY_CARE_PROVIDER_SITE_OTHER): Payer: Medicare Other | Admitting: Physician Assistant

## 2017-01-08 VITALS — BP 104/71 | HR 106 | Temp 100.2°F | Resp 18 | Ht 66.0 in | Wt 245.0 lb

## 2017-01-08 DIAGNOSIS — E876 Hypokalemia: Secondary | ICD-10-CM | POA: Diagnosis not present

## 2017-01-08 DIAGNOSIS — N390 Urinary tract infection, site not specified: Secondary | ICD-10-CM | POA: Diagnosis not present

## 2017-01-08 DIAGNOSIS — R319 Hematuria, unspecified: Secondary | ICD-10-CM | POA: Diagnosis not present

## 2017-01-08 DIAGNOSIS — M546 Pain in thoracic spine: Secondary | ICD-10-CM

## 2017-01-08 DIAGNOSIS — K120 Recurrent oral aphthae: Secondary | ICD-10-CM

## 2017-01-08 DIAGNOSIS — R509 Fever, unspecified: Secondary | ICD-10-CM | POA: Diagnosis not present

## 2017-01-08 DIAGNOSIS — D649 Anemia, unspecified: Secondary | ICD-10-CM

## 2017-01-08 DIAGNOSIS — E0590 Thyrotoxicosis, unspecified without thyrotoxic crisis or storm: Secondary | ICD-10-CM

## 2017-01-08 DIAGNOSIS — L409 Psoriasis, unspecified: Secondary | ICD-10-CM | POA: Diagnosis not present

## 2017-01-08 MED ORDER — MAGIC MOUTHWASH W/LIDOCAINE: 5.0000 mL | Freq: Three times a day (TID) | ORAL | 0 refills | Status: DC

## 2017-01-08 MED ORDER — TRIAMCINOLONE ACETONIDE 0.5 % EX CREA: 1.0000 "application " | TOPICAL_CREAM | Freq: Two times a day (BID) | CUTANEOUS | 0 refills | Status: AC

## 2017-01-08 NOTE — Progress Notes (Signed)
Subjective:  Patient ID: Erika Boyer, female    DOB: 12/03/1995  Age: 21 y.o. MRN: [id number]  CC: f/u  HPI Erika Boyer is a 21 y.o. female with a PMH of anxiety, depression, anemia, Graves Disease, HTN, Lupus, and Cerebellar degeneration presents to f/u on her thyroid levels. Has gone to endocrinology and was diagnosed with hyperthyroidism in remission. F/u with endocrinology in six months recommended.    Complains of "bloody ear" and "scarring" bilaterally since one week. Associated with itching. Also has painful ulcers in the roof of the mouth x1-2 weeks. Painful to eat and has been eating a lot less. Mother contributes that patient has not been taking iron pills, potassium, and multivitamin. Patient lowers head and bashfully rejects mother's observation.    Patient also complains of right sided mid back pain. Associated with mildly increased urinary frequency. Onset approximately one week. Pain was first felt in the left mid back but that pain has self resolved. Mother says pt has been "feeling bad" as of late. Denies dysuria or urinary urgency.      Outpatient Medications Prior to Visit  Medication Sig Dispense Refill  . albuterol (PROAIR HFA) 108 (90 Base) MCG/ACT inhaler Inhale 2 puffs into the lungs every 4 (four) hours as needed for wheezing or shortness of breath.    . diclofenac (VOLTAREN) 50 MG EC tablet Take 1 tablet (50 mg total) by mouth 2 (two) times daily. (Patient not taking: Reported on 12/18/2016) 6 tablet 0  . EPINEPHrine 0.3 mg/0.3 mL IJ SOAJ injection USE AS DIRECTED AND THEN CALL 911    . famotidine (PEPCID) 20 MG tablet Take 1 tablet (20 mg total) by mouth 2 (two) times daily. (Patient not taking: Reported on 12/18/2016) 60 tablet 0  . hydrochlorothiazide (HYDRODIURIL) 25 MG tablet TAKE 1 TABLET(25 MG) BY MOUTH DAILY (Patient not taking: Reported on 12/18/2016) 90 tablet 0  . iron polysaccharides (NIFEREX) 150 MG capsule Take 1 capsule (150 mg total) by mouth 2  (two) times daily before lunch and supper. (Patient not taking: Reported on 12/18/2016) 60 capsule 0  . losartan (COZAAR) 50 MG tablet TAKE 1 TABLET(50 MG) BY MOUTH DAILY (Patient not taking: Reported on 12/18/2016) 90 tablet 0  . multivitamin (PROSIGHT) TABS tablet Take 1 tablet by mouth daily. (Patient not taking: Reported on 12/18/2016) 30 each 0  . pantoprazole (PROTONIX) 20 MG tablet Take 1 tablet (20 mg total) by mouth daily. (Patient not taking: Reported on 12/18/2016) 30 tablet 1  . potassium chloride SA (K-DUR,KLOR-CON) 20 MEQ tablet TAKE 1 TABLET(20 MEQ) BY MOUTH TWICE DAILY (Patient not taking: Reported on 12/18/2016) 180 tablet 0   No facility-administered medications prior to visit.      ROS Review of Systems  Constitutional: Positive for malaise/fatigue. Negative for chills and fever.  Eyes: Negative for blurred vision.  Respiratory: Negative for shortness of breath.   Cardiovascular: Negative for chest pain and palpitations.  Gastrointestinal: Negative for abdominal pain and nausea.       Mouth ulcers  Genitourinary: Negative for dysuria and hematuria.  Musculoskeletal: Negative for joint pain and myalgias.  Skin: Positive for rash.  Neurological: Negative for tingling and headaches.  Psychiatric/Behavioral: Negative for depression. The patient is not nervous/anxious.     Objective:  BP 104/71 (BP Location: Right Arm, Patient Position: Sitting, Cuff Size: Large)   Pulse (!) 106   Temp 100.2 F (37.9 C) (Oral)   Resp 18   Ht 5\' 6"  (1.676 m)   Wt  245 lb (111.1 kg)   LMP 01/03/2017   SpO2 100%   BMI 39.54 kg/m   BP/Weight 01/08/2017 01/01/2017 12/18/2016  Systolic BP 104 118 118  Diastolic BP 71 76 72  Wt. (Lbs) 245 253.4 250  BMI 39.54 42.17 41.6      Physical Exam  Constitutional: She is oriented to person, place, and time.  Well developed, obese, NAD, polite, not well versed  HENT:  Head: Normocephalic and atraumatic.  Eyes: No scleral icterus.  Neck:  Normal range of motion. Neck supple. No thyromegaly present.  Cardiovascular: Normal rate, regular rhythm and normal heart sounds.   Pulmonary/Chest: Effort normal and breath sounds normal.  Genitourinary:  Genitourinary Comments: Mild right sided CVA tenderness, no tenderness on left.  Musculoskeletal: She exhibits no edema.  Neurological: She is alert and oriented to person, place, and time.  Unsteady gait  Skin: Skin is warm and dry. No rash noted. No erythema. No pallor.  Scaling and crusting in the concha and behind the ear bilaterally. No sign of tenderness, cellulitis, edema, suppuration, or active bleeding  Psychiatric: She has a normal mood and affect. Her behavior is normal. Thought content normal.  Vitals reviewed.    Assessment & Plan:     1. Hyperthyroidism - Seems to be in remission according to Endocrinology. Has been directed by endocrinology to f/u with them in 6 months. Defer management to endocrinology.  2. Psoriasis - Begin triamcinolone cream (KENALOG) 0.5 %; Apply 1 application topically 2 (two) times daily. Do not use for more than 7 consecutive days without medical approval.  Dispense: 14 g; Refill: 0 - Will consider dermatology referral if psoriasis is persistent.   3. Hypokalemia - Basic Metabolic Panel  4. Anemia, unspecified type - Iron and TIBC - CBC with Differential - Ferritin  5. Aphthous ulcer of mouth - Begin magic mouthwash w/lidocaine SOLN; Take 5 mLs by mouth 3 (three) times daily.  Dispense: 110 mL; Refill: 0. Instructed to swish for approximately 30 seconds and to spit out.  6. Acute right-sided thoracic back pain - Suspecting a UTI. Patient unable to provide urine sample in clinic today.  I have strongly advised that she go to urgent care or emergency if patient has a decline in health.  I have also given mother a specimen cup so she may try to collect urine before the clinic closes or, at the latest, tomorrow morning.  7. Fever -  Suspecting UTI.  Patient unable to provide urine sample in clinic today. I have strongly advised that she go to urgent care or emergency if patient has a decline in health.  I have also given mother a specimen cup so she may try to collect urine before the clinic closes or, at the latest, tomorrow morning.    Meds ordered this encounter  Medications  . triamcinolone cream (KENALOG) 0.5 %    Sig: Apply 1 application topically 2 (two) times daily. Do not use for more than 7 consecutive days without medical approval.    Dispense:  14 g    Refill:  0    Order Specific Question:   Supervising Provider    Answer:   Quentin Angst [id number]  . magic mouthwash w/lidocaine SOLN    Sig: Take 5 mLs by mouth 3 (three) times daily.    Dispense:  110 mL    Refill:  0    Order Specific Question:   Supervising Provider    Answer:   Quentin Angst [  [id number]]    Follow-up: Return in about 2 months (around 03/10/2017).   Loletta Specter PA

## 2017-01-08 NOTE — Patient Instructions (Signed)
Stomatitis Stomatitis is a condition that causes inflammation in your mouth. It can affect a part of your mouth or your whole mouth. The condition often affects your cheek, teeth, gums, lips, and tongue. Stomatitis can also affect the mucous membranes that surround your mouth (mucosa). Pain from stomatitis can make it hard for you to eat or drink. Severe cases of this condition can lead to dehydration or poor nutrition. What are the causes? Common causes of this condition include:  Viruses, such as cold sores or oral herpes and shingles.  Canker sores.  Bacterial infections.  Fungus or yeast infections, such as oral thrush.  Not getting adequate nutrition.  Injury to your mouth. This can be from: ? Dentures or braces that do not fit well. ? Biting your tongue or cheek. ? Burning your mouth. ? Having sharp or broken teeth.  Gum disease.  Using tobacco, especially chewing tobacco.  Allergies to foods, medicines, or substances that are used in your mouth.  Medicines, including cancer medicines (chemotherapy), antihistamines, and seizure medicines.  In some cases, the cause may not be known. What increases the risk? This condition is more likely to develop in people who:  Have poor oral hygiene or poor nutrition.  Have any condition that causes a dry mouth.  Are under a lot of physical or emotional stress.  Have any condition that weakens the body's defense system (immune system).  Are being treated for cancer.  Smoke.  What are the signs or symptoms? The most common symptoms of this condition are pain, swelling, and redness inside your mouth. The pain may feel like burning or stinging. It may get worse from eating or drinking. Other symptoms include:  Painful, shallow sores (ulcers) in the mouth.  Blisters in the mouth.  Bleeding gums.  Swollen gums.  Irritability and fatigue.  Bad breath.  Bad taste in the mouth.  Fever.  How is this diagnosed? This  condition is diagnosed with a physical exam to check for bleeding gums and mouth ulcers. You may also have other tests, including:  Blood tests to look for infection or vitamin deficiencies.  Mouth swab to get a fluid sample to test for bacteria (culture).  Tissue sample from an ulcer to examine under a microscope (biopsy).  How is this treated? Treatment for stomatitis depends on the cause. Treatment may include medicines, such as:  Over-the counter (OTC) pain medicines.  Topical anesthetic to numb the area if you have severe pain.  Antibiotics to treat a bacterial infection.  Antifungals to treat a fungal infection.  Antivirals to treat a viral infection.  Mouth rinses that contain steroids to reduce the swelling in your mouth.  Other medicines to coat or numb your mouth.  Follow these instructions at home: Medicines  Take medicines only as directed by your health care provider.  If you were prescribed an antibiotic, finish all of it even if you start to feel better. Lifestyle  Practice good oral hygiene: ? Gently brush your teeth with a soft, nylon-bristled toothbrush two times each day. ? Floss your teeth every day. ? Have your teeth cleaned regularly, as recommended by your dentist.  Eat a balanced diet.Do not eat: ? Spicy foods. ? Citrus, such as oranges. ? Foods that have sharp edges, such as chips.  Avoid any foods or other allergens that you think may be causing your stomatitis.  If you have dentures, make sure that they are properly fitted.  Do not use any tobacco products, including cigarettes,   chewing tobacco, or electronic cigarettes. If you need help quitting, ask your health care provider.  Find ways to reduce stress. Try yoga or meditation. Ask your health care provider for other ideas. General instructions  Use a salt-water rinse for pain as directed by your health care provider. Mix 1 tsp of salt in 2 cups of water.  Drink enough fluid to keep  your urine clear or pale yellow. This will keep you hydrated. Contact a health care provider if:  Your symptoms get worse.  You develop new symptoms, especially: ? A rash. ? New symptoms that do not involve your mouth area.  Your symptoms last longer than three weeks.  Your stomatitis goes away and then returns.  You have a harder time eating and drinking normally.  You have increasing fatigue or weakness.  You lose your appetite or you feel nauseous.  You have a fever. This information is not intended to replace advice given to you by your health care provider. Make sure you discuss any questions you have with your health care provider. Document Released: 03/04/2007 Document Revised: 01/04/2016 Document Reviewed: 05/03/2014 Elsevier Interactive Patient Education  2018 [employer]. Psoriasis Psoriasis is a long-term (chronic) condition of skin inflammation. It occurs because your immune system causes skin cells to form too quickly. As a result, too many skin cells grow and create raised, red patches (plaques) that look silvery on your skin. Plaques may appear anywhere on your body. They can be any size or shape. Psoriasis can come and go. The condition varies from mild to very severe. It cannot be passed from one person to another (not contagious). What are the causes? The cause of psoriasis is not known, but certain factors can make the condition worse. These include:  Damage or trauma to the skin, such as cuts, scrapes, sunburn, and dryness.  Lack of sunlight.  Certain medicines.  Alcohol.  Tobacco use.  Stress.  Infections caused by bacteria or viruses.  What increases the risk? This condition is more likely to develop in:  People with a family history of psoriasis.  People who are Caucasian.  People who are between the ages of 15-52 and 5-35 years old.  What are the signs or symptoms? There are five different types of psoriasis. You can have more than one  type of psoriasis during your life. Types are:  Plaque.  Guttate.  Inverse.  Pustular.  Erythrodermic.  Each type of psoriasis has different symptoms.  Plaque psoriasis symptoms include red, raised plaques with a silvery white coating (scale). These plaques may be itchy. Your nails may be pitted and crumbly or fall off.  Guttate psoriasis symptoms include small red spots that often show up on your trunk, arms, and legs. These spots may develop after you have been sick, especially with strep throat.  Inverse psoriasis symptoms include plaques in your underarm area, under your breasts, or on your genitals, groin, or buttocks.  Pustular psoriasis symptoms include pus-filled bumps that are painful, red, and swollen on the palms of your hands or the soles of your feet. You also may feel exhausted, feverish, weak, or have no appetite.  Erythrodermic psoriasis symptoms include bright red skin that may look burned. You may have a fast heartbeat and a body temperature that is too high or too low. You may be itchy or in pain.  How is this diagnosed? Your health care provider may suspect psoriasis based on your symptoms and family history. Your health care provider will also  do a physical exam. This may include a procedure to remove a tissue sample (biopsy) for testing. You may also be referred to a health care provider who specializes in skin diseases (dermatologist). How is this treated? There is no cure for this condition, but treatment can help manage it. Goals of treatment include:  Helping your skin heal.  Reducing itching and inflammation.  Slowing the growth of new skin cells.  Helping your immune system respond better to your skin.  Treatment varies, depending on the severity of your condition. Treatment may include:  Creams or ointments.  Ultraviolet ray exposure (light therapy). This may include natural sunlight or light therapy in a medical office.  Medicines (systemic  therapy). These medicines can help your body better manage skin cell turnover and inflammation. They may be used along with light therapy or ointments. You may also get antibiotic medicines if you have an infection.  Follow these instructions at home: Skin Care  Moisturize your skin as needed. Only use moisturizers that have been approved by your health care provider.  Apply cool compresses to the affected areas.  Do not scratch your skin. Lifestyle   Do not use tobacco products. This includes cigarettes, chewing tobacco, and e-cigarettes. If you need help quitting, ask your health care provider.  Drink little or no alcohol.  Try techniques for stress reduction, such as meditation or yoga.  Get exposure to the sun as told by your health care provider. Do not get sunburned.  Consider joining a psoriasis support group. Medicines  Take or use over-the-counter and prescription medicines only as told by your health care provider.  If you were prescribed an antibiotic, take or use it as told by your health care provider. Do not stop taking the antibiotic even if your condition starts to improve. General instructions  Keep a journal to help track what triggers an outbreak. Try to avoid any triggers.  See a counselor or social worker if feelings of sadness, frustration, and hopelessness about your condition are interfering with your work and relationships.  Keep all follow-up visits as told by your health care provider. This is important. Contact a health care provider if:  Your pain gets worse.  You have increasing redness or warmth in the affected areas.  You have new or worsening pain or stiffness in your joints.  Your nails start to break easily or pull away from the nail bed.  You have a fever.  You feel depressed. This information is not intended to replace advice given to you by your health care provider. Make sure you discuss any questions you have with your health care  provider. Document Released: 05/04/2000 Document Revised: 10/13/2015 Document Reviewed: 09/22/2014 Elsevier Interactive Patient Education  2018 [employer].

## 2017-01-09 ENCOUNTER — Other Ambulatory Visit (INDEPENDENT_AMBULATORY_CARE_PROVIDER_SITE_OTHER): Payer: Medicare Other | Admitting: Physician Assistant

## 2017-01-09 DIAGNOSIS — R350 Frequency of micturition: Secondary | ICD-10-CM | POA: Diagnosis not present

## 2017-01-09 DIAGNOSIS — R509 Fever, unspecified: Secondary | ICD-10-CM | POA: Diagnosis not present

## 2017-01-09 DIAGNOSIS — M546 Pain in thoracic spine: Secondary | ICD-10-CM | POA: Diagnosis not present

## 2017-01-09 DIAGNOSIS — R319 Hematuria, unspecified: Secondary | ICD-10-CM | POA: Diagnosis not present

## 2017-01-09 DIAGNOSIS — N390 Urinary tract infection, site not specified: Secondary | ICD-10-CM | POA: Diagnosis not present

## 2017-01-09 LAB — CBC WITH DIFFERENTIAL/PLATELET
BASOS ABS: 0 10*3/uL (ref 0.0–0.2)
BASOS: 0 %
EOS (ABSOLUTE): 0 10*3/uL (ref 0.0–0.4)
Eos: 0 %
Hematocrit: 29.3 % — ABNORMAL LOW (ref 34.0–46.6)
Hemoglobin: 9.2 g/dL — ABNORMAL LOW (ref 11.1–15.9)
IMMATURE GRANULOCYTES: 0 %
Immature Grans (Abs): 0 10*3/uL (ref 0.0–0.1)
Lymphocytes Absolute: 0.9 10*3/uL (ref 0.7–3.1)
Lymphs: 39 %
MCH: 23.4 pg — ABNORMAL LOW (ref 26.6–33.0)
MCHC: 31.4 g/dL — ABNORMAL LOW (ref 31.5–35.7)
MCV: 75 fL — AB (ref 79–97)
MONOS ABS: 0.2 10*3/uL (ref 0.1–0.9)
Monocytes: 7 %
NEUTROS PCT: 54 %
Neutrophils Absolute: 1.2 10*3/uL — ABNORMAL LOW (ref 1.4–7.0)
PLATELETS: 304 10*3/uL (ref 150–379)
RBC: 3.93 x10E6/uL (ref 3.77–5.28)
RDW: 15.8 % — AB (ref 12.3–15.4)
WBC: 2.3 10*3/uL — AB (ref 3.4–10.8)

## 2017-01-09 LAB — BASIC METABOLIC PANEL
BUN / CREAT RATIO: 10 (ref 9–23)
BUN: 7 mg/dL (ref 6–20)
CALCIUM: 8.6 mg/dL — AB (ref 8.7–10.2)
CHLORIDE: 101 mmol/L (ref 96–106)
CO2: 21 mmol/L (ref 20–29)
Creatinine, Ser: 0.7 mg/dL (ref 0.57–1.00)
GFR calc non Af Amer: 124 mL/min/{1.73_m2} (ref >59)
GFR, EST AFRICAN AMERICAN: 143 mL/min/{1.73_m2} (ref >59)
GLUCOSE: 81 mg/dL (ref 65–99)
POTASSIUM: 3.9 mmol/L (ref 3.5–5.2)
Sodium: 139 mmol/L (ref 134–144)

## 2017-01-09 LAB — FERRITIN: Ferritin: 301 ng/mL — ABNORMAL HIGH (ref 15–150)

## 2017-01-09 LAB — POCT URINALYSIS DIPSTICK
GLUCOSE UA: NEGATIVE
Nitrite, UA: NEGATIVE
Protein, UA: 100
SPEC GRAV UA: 1.025 (ref 1.010–1.025)
pH, UA: 7 (ref 5.0–8.0)

## 2017-01-09 LAB — IRON AND TIBC
IRON SATURATION: 6 % — AB (ref 15–55)
IRON: 15 ug/dL — AB (ref 27–159)
Total Iron Binding Capacity: 247 ug/dL — ABNORMAL LOW (ref 250–450)
UIBC: 232 ug/dL (ref 131–425)

## 2017-01-09 MED ORDER — CIPROFLOXACIN HCL 500 MG PO TABS: 500.0000 mg | ORAL_TABLET | Freq: Two times a day (BID) | ORAL | 0 refills | Status: AC

## 2017-01-09 NOTE — Addendum Note (Signed)
Addended by: [name] on: 01/09/2017 10:45 AM   Modules accepted: Orders

## 2017-01-09 NOTE — Progress Notes (Signed)
Mother brought in pt specimen this morning.

## 2017-01-11 LAB — URINE CULTURE

## 2017-01-14 ENCOUNTER — Ambulatory Visit (INDEPENDENT_AMBULATORY_CARE_PROVIDER_SITE_OTHER): Payer: Medicare Other | Admitting: Physician Assistant

## 2017-01-23 DIAGNOSIS — M329 Systemic lupus erythematosus, unspecified: Secondary | ICD-10-CM | POA: Diagnosis not present

## 2017-01-23 DIAGNOSIS — R5383 Other fatigue: Secondary | ICD-10-CM | POA: Diagnosis not present

## 2017-01-23 DIAGNOSIS — E0500 Thyrotoxicosis with diffuse goiter without thyrotoxic crisis or storm: Secondary | ICD-10-CM | POA: Diagnosis not present

## 2017-01-23 DIAGNOSIS — D508 Other iron deficiency anemias: Secondary | ICD-10-CM | POA: Diagnosis not present

## 2017-01-23 DIAGNOSIS — Z6841 Body Mass Index (BMI) 40.0 and over, adult: Secondary | ICD-10-CM | POA: Diagnosis not present

## 2017-01-23 DIAGNOSIS — G319 Degenerative disease of nervous system, unspecified: Secondary | ICD-10-CM | POA: Diagnosis not present

## 2017-01-23 DIAGNOSIS — J90 Pleural effusion, not elsewhere classified: Secondary | ICD-10-CM | POA: Diagnosis not present

## 2017-01-23 DIAGNOSIS — I313 Pericardial effusion (noninflammatory): Secondary | ICD-10-CM | POA: Diagnosis not present

## 2017-02-04 ENCOUNTER — Encounter: Payer: Medicare Other | Attending: Physical Medicine & Rehabilitation | Admitting: Physical Medicine & Rehabilitation

## 2017-02-04 ENCOUNTER — Encounter: Payer: Self-pay | Admitting: Physical Medicine & Rehabilitation

## 2017-02-04 VITALS — BP 130/92 | HR 93 | Resp 14

## 2017-02-04 DIAGNOSIS — Z818 Family history of other mental and behavioral disorders: Secondary | ICD-10-CM | POA: Diagnosis not present

## 2017-02-04 DIAGNOSIS — R269 Unspecified abnormalities of gait and mobility: Secondary | ICD-10-CM | POA: Diagnosis not present

## 2017-02-04 DIAGNOSIS — N289 Disorder of kidney and ureter, unspecified: Secondary | ICD-10-CM | POA: Diagnosis not present

## 2017-02-04 DIAGNOSIS — T50905A Adverse effect of unspecified drugs, medicaments and biological substances, initial encounter: Secondary | ICD-10-CM | POA: Insufficient documentation

## 2017-02-04 DIAGNOSIS — I10 Essential (primary) hypertension: Secondary | ICD-10-CM | POA: Insufficient documentation

## 2017-02-04 DIAGNOSIS — R600 Localized edema: Secondary | ICD-10-CM | POA: Insufficient documentation

## 2017-02-04 DIAGNOSIS — G319 Degenerative disease of nervous system, unspecified: Secondary | ICD-10-CM

## 2017-02-04 DIAGNOSIS — D509 Iron deficiency anemia, unspecified: Secondary | ICD-10-CM | POA: Insufficient documentation

## 2017-02-04 DIAGNOSIS — Z7952 Long term (current) use of systemic steroids: Secondary | ICD-10-CM | POA: Diagnosis not present

## 2017-02-04 DIAGNOSIS — F419 Anxiety disorder, unspecified: Secondary | ICD-10-CM | POA: Diagnosis not present

## 2017-02-04 DIAGNOSIS — M320 Drug-induced systemic lupus erythematosus: Secondary | ICD-10-CM | POA: Diagnosis not present

## 2017-02-04 DIAGNOSIS — R000 Tachycardia, unspecified: Secondary | ICD-10-CM | POA: Diagnosis not present

## 2017-02-04 DIAGNOSIS — F09 Unspecified mental disorder due to known physiological condition: Secondary | ICD-10-CM

## 2017-02-04 DIAGNOSIS — Z8701 Personal history of pneumonia (recurrent): Secondary | ICD-10-CM | POA: Diagnosis not present

## 2017-02-04 DIAGNOSIS — Z8261 Family history of arthritis: Secondary | ICD-10-CM | POA: Diagnosis not present

## 2017-02-04 DIAGNOSIS — Z79899 Other long term (current) drug therapy: Secondary | ICD-10-CM | POA: Diagnosis not present

## 2017-02-04 DIAGNOSIS — F329 Major depressive disorder, single episode, unspecified: Secondary | ICD-10-CM | POA: Diagnosis not present

## 2017-02-04 DIAGNOSIS — M1990 Unspecified osteoarthritis, unspecified site: Secondary | ICD-10-CM | POA: Insufficient documentation

## 2017-02-04 DIAGNOSIS — R5381 Other malaise: Secondary | ICD-10-CM | POA: Diagnosis not present

## 2017-02-04 DIAGNOSIS — Z8249 Family history of ischemic heart disease and other diseases of the circulatory system: Secondary | ICD-10-CM | POA: Diagnosis not present

## 2017-02-04 NOTE — Patient Instructions (Signed)
1. SLOW DOWNNNNNNNNNNNNNNN!!!!!!!   2. USE YOUR WALKER WHENEVER POSSIBLE!!!!!!!    PLEASE FEEL FREE TO CALL OUR OFFICE WITH ANY PROBLEMS OR QUESTIONS [phone])

## 2017-02-04 NOTE — Progress Notes (Signed)
Subjective:    Patient ID: Erika Boyer, female    DOB: [birth year], 21 y.o.   MRN: [id number]  HPI   Erika Boyer is here in follow up of her chronic gait disorder. She fell getting out of the shower the other day when she slipped and bumped her knee. She tells me that she can't remember the last time she fell otherwise. She is also reporting some right "collar bone" pain which may have started when she developed a cold a few days ago.   She remains on prednisone for her lupus at apparnetly a  daily dose.    Pain Inventory Average Pain 3 Pain Right Now 3 My pain is sharp  In the last 24 hours, has pain interfered with the following? General activity 3 Relation with others 0 Enjoyment of life 8 What TIME of day is your pain at its worst? night Sleep (in general) Fair  Pain is worse with: bending and some activites Pain improves with: medication Relief from Meds: 0  Mobility walk with assistance use a walker how many minutes can you walk? ? ability to climb steps?  no do you drive?  no Do you have any goals in this area?  yes  Function disabled: date disabled .  Neuro/Psych trouble walking  Prior Studies    Physicians involved in your care Any changes since last visit?  no   Family History  Problem Relation Age of Onset  . Thalassemia Mother   . Arthritis Mother   . High blood pressure Father   . Mental illness Father        question bipolar disorder per wife  . Sickle cell trait Maternal Aunt   . Cancer Maternal Grandmother        Bone  . Multiple sclerosis Cousin    Social History   Social History  . Marital status: Single    Spouse name: N/A  . Number of children: N/A  . Years of education: N/A   Social History Main Topics  . Smoking status: Never Smoker  . Smokeless tobacco: Never Used  . Alcohol use No  . Drug use: No  . Sexual activity: Yes   Other Topics Concern  . None   Social History Narrative   Lives at home w/ her mom   Right-handed   Caffeine: occasional coffee   Past Surgical History:  Procedure Laterality Date  . PERICARDIOCENTESIS N/A 10/04/2016   Procedure: Pericardiocentesis;  Surgeon: Yates Decamp, MD;  Location: [facility] INVASIVE CV LAB;  Service: Cardiovascular;  Laterality: N/A;  . REDUCTION MAMMAPLASTY Bilateral 2012  . TONSILLECTOMY     Past Medical History:  Diagnosis Date  . Anemia   . Anxiety   . Arthritis    "right knee" (10/11/2016)  . Cerebellar degeneration   . Chronic bronchitis (HCC)   . Daily headache   . Depression   . Graves disease 2012   hx; "was on RX; it disappeared" (10/11/2016)  . History of blood transfusion    "related to the anemia" (10/11/2016)  . Hypertension   . Lupus    "not sure what kind" (10/11/2016)  . Pneumonia 10/03/2016   BP (!) 130/92   Pulse 93   Resp 14   SpO2 97%   Opioid Risk Score:   Fall Risk Score:  `1  Depression screen PHQ 2/9  Depression screen [facility] 2/9 12/03/2016 11/13/2016 08/23/2016  Decreased Interest 0 0 2  Down, Depressed, Hopeless 0 0 2  PHQ - 2 Score  0 0 4  Altered sleeping - - 1  Tired, decreased energy - - 2  Change in appetite - - 2  Feeling bad or failure about yourself  - - 1  Trouble concentrating - - 1  Moving slowly or fidgety/restless - - 2  Suicidal thoughts - - 2  PHQ-9 Score - - 15    Review of Systems  Constitutional: Negative.   HENT: Negative.   Eyes: Negative.   Respiratory: Negative.   Cardiovascular: Negative.   Gastrointestinal: Negative.   Endocrine: Negative.   Genitourinary: Negative.   Musculoskeletal: Positive for gait problem.  Skin: Negative.   Allergic/Immunologic: Negative.   Hematological: Negative.   Psychiatric/Behavioral: Negative.        Objective:   Physical Exam Constitutional: She is oriented to person, place, and time. She appears well-developedand well-nourished. No distress.  HENT:  Head: Normocephalicand atraumatic.  Mouth/Throat: moist.  Eyes: EOMI  Neck: . Neck  supple. No nodes. Tender along anterior right SCM.  Cardiovascular RRR without murmur. No JVD  Respiratory: normal effortt GI: Soft .  Musculoskeletal: She exhibits no tenderness.  No edema BLE Neurological: MOTOR 5/5. Ataxic gait. Walks too fast. Better with walker. Fair insight and awareness.   Skin: Skin is warmand dry. Marland Kitchen  Psychiatric: cooperative. pleasant      Assessment & Plan:  1. Debility secondary to Sepsis, lupuswith multiple medical issues.  -remains a high fall risk.           -she needs to use her walker. Needs to slow down            2. Right clavicular/cervical discomfort----?soft tissue. 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  daily           -outpt follow up per Dr. Nickola Major   4. Iron deficiency anemia: Continue iron supplement. :  5. HTN: per primary   Follow up with me prn. 10 minutes of face to face patient care time were spent during this visit. All questions were encouraged and answered.

## 2017-02-05 ENCOUNTER — Telehealth (INDEPENDENT_AMBULATORY_CARE_PROVIDER_SITE_OTHER): Payer: Self-pay | Admitting: Physician Assistant

## 2017-02-05 ENCOUNTER — Encounter (INDEPENDENT_AMBULATORY_CARE_PROVIDER_SITE_OTHER): Payer: Self-pay | Admitting: Physician Assistant

## 2017-02-05 ENCOUNTER — Ambulatory Visit (INDEPENDENT_AMBULATORY_CARE_PROVIDER_SITE_OTHER): Payer: Medicare Other | Admitting: Physician Assistant

## 2017-02-05 VITALS — BP 124/87 | HR 97 | Temp 98.4°F | Wt 249.6 lb

## 2017-02-05 DIAGNOSIS — R2689 Other abnormalities of gait and mobility: Secondary | ICD-10-CM

## 2017-02-05 DIAGNOSIS — R790 Abnormal level of blood mineral: Secondary | ICD-10-CM | POA: Diagnosis not present

## 2017-02-05 DIAGNOSIS — Z124 Encounter for screening for malignant neoplasm of cervix: Secondary | ICD-10-CM

## 2017-02-05 DIAGNOSIS — D509 Iron deficiency anemia, unspecified: Secondary | ICD-10-CM

## 2017-02-05 NOTE — Patient Instructions (Signed)
Vitamin B12 and Folate Test Vitamin B12 and folate (folic acid) are both B vitamins that are needed to make red blood cells and keep your nervous system healthy. Having normal levels is important. You may be low in these B vitamins if you do not get enough of them in your diet. Folate is found in:  Leafy green vegetables.  Beans.  Fruits.  Grains and cereals that have had folate added (fortified).  Vitamin B12 is found in:  Meats.  Eggs.  Dairy products.  Fish.  Vitamin B12 fortified grains and cereals.  You may also have low levels of these vitamins if you have a digestive system disease that interferes with your ability to absorb them from your food. The most common cause of a vitamin B12 deficiency is the inability to absorb it (pernicious anemia). You may have this blood test if you have symptoms of a vitamin B12 or folate deficiency. These include:  Fatigue.  Headache.  Confusion.  Sore mouth.  Poor balance.  Tingling or numbness.  You may also have this test if:  You are pregnant or breastfeeding. Women who are pregnant or breastfeeding need more folate and may need to take supplements.  Your red blood cell count is low (anemia).  You are an older person and have mental confusion.  You have a disease or condition that may lead to vitamin B deficiency.  This test requires a blood sample taken from a vein in your hand or arm. The tests for vitamin B12 and folate may be done together or separately. How do I prepare for this test?  You may not be able to eat before the blood test as directed by your health care provider.  Tell your health care provider about: ? All medicines you are taking, including vitamins, herbs, eye drops, creams, and over-the-counter medicines, including medicine for heartburn. Many medicines can affect your B12 and folate levels. ? Medical conditions you have, including if you are or may be pregnant. ? How often you drink  alcohol. What do the results mean? It is your responsibility to obtain your test results. Ask the lab or department performing the test when and how you will get your results. Contact your health care provider to discuss any questions you have about your results. The results of this test will be reported as a range of values. Range of Normal Values Ranges for normal values vary among different labs and hospitals. You should always check with your health care provider after having lab work or other tests done to discuss whether your values are considered within normal limits.  The normal range for B12 is 160-950 pg/mL or 118-701 pmol/L (SI units).  The normal range for folate is 5-25 ng/mL or 11-57 mmol/L (SI units).  Meaning of Results Outside Normal Range Values High levels of vitamin B12 are rare, but may happen if you have:  Cancer.  Diabetes.  Heart failure.  Obesity.  Liver disease.  Human immunodeficiency virus (HIV).  It is more common to have low levels of vitamin B12 and folate. Talk to your health care provider about what your test results mean for you. Some common causes of low vitamin B12 or folate levels include:  Pernicious and other kinds of anemia.  Poor nutrition.  Alcoholism.  Liver disease.  Digestive disease.  Talk with your health care provider to discuss your results, treatment options, and if necessary, the need for more tests. Talk with your health care provider if you have   any questions about your results. This information is not intended to replace advice given to you by your health care provider. Make sure you discuss any questions you have with your health care provider. Document Released: 06/01/2004 Document Revised: 01/11/2016 Document Reviewed: 08/25/2013 Elsevier Interactive Patient Education  2018 [employer]  

## 2017-02-05 NOTE — Progress Notes (Signed)
   Subjective:  Patient ID: Erika Boyer, female    DOB: [birth year]  Age: 21 y.o. MRN: [id number]  CC: labs  HPI   Ralphine Hinks a 20 y.o.femalewith a PMH of anxiety, depression, anemia, Graves Disease, HTN, Lupus, and Cerebellar degeneration presents to have vitamin b12 and folate levels as requested by Sindy Messing PA-C. Labs ordered due to long standing history of anemia and due to poor balance.  Work up for celiac disease considered but patient denies any gastrointestinal symptoms. Mother requests a referral to OB/GYN for a PAP smear to be done.   Outpatient Medications Prior to Visit  Medication Sig Dispense Refill  . albuterol (PROAIR HFA) 108 (90 Base) MCG/ACT inhaler Inhale 2 puffs into the lungs every 4 (four) hours as needed for wheezing or shortness of breath.    . EPINEPHrine 0.3 mg/0.3 mL IJ SOAJ injection USE AS DIRECTED AND THEN CALL 911    . multivitamin (PROSIGHT) TABS tablet Take 1 tablet by mouth daily. 30 each 0  . pantoprazole (PROTONIX) 20 MG tablet Take 1 tablet (20 mg total) by mouth daily. 30 tablet 1   No facility-administered medications prior to visit.      ROS Review of Systems  Constitutional: Negative for chills, fever and malaise/fatigue.  Eyes: Negative for blurred vision.  Respiratory: Negative for shortness of breath.   Cardiovascular: Negative for chest pain and palpitations.  Gastrointestinal: Negative for abdominal pain and nausea.  Genitourinary: Negative for dysuria and hematuria.  Musculoskeletal: Negative for joint pain and myalgias.  Skin: Negative for rash.  Neurological: Negative for tingling and headaches.  Psychiatric/Behavioral: Negative for depression. The patient is not nervous/anxious.     Objective:  BP 124/87 (BP Location: Left Arm, Patient Position: Sitting, Cuff Size: Large)   Pulse 97   Temp 98.4 F (36.9 C) (Oral)   Wt 249 lb 9.6 oz (113.2 kg)   LMP 02/05/2017 (Exact Date)   SpO2 99%   BMI 40.29 kg/m    BP/Weight 02/05/2017 02/04/2017 01/08/2017  Systolic BP 124 130 104  Diastolic BP 87 92 71  Wt. (Lbs) 249.6 - 245  BMI 40.29 - 39.54      Physical Exam  Constitutional: She is oriented to person, place, and time.  Well developed, obese, NAD, polite, using walker  HENT:  Head: Normocephalic and atraumatic.  Cardiovascular: Normal rate, regular rhythm and normal heart sounds.   Pulmonary/Chest: Effort normal and breath sounds normal.  Musculoskeletal: She exhibits no edema.  Neurological: She is alert and oriented to person, place, and time.  Skin: Skin is warm and dry. No rash noted. No erythema. No pallor.  Psychiatric: She has a normal mood and affect. Her behavior is normal. Thought content normal.  Vitals reviewed.    Assessment & Plan:   1. Iron deficiency anemia, unspecified iron deficiency anemia type - B12 and Folate Panel  2. Abnormal iron saturation - B12 and Folate Panel  3. Poor balance - B12 and Folate Panel  4. Screening for cervical cancer - Ambulatory referral to Gynecology    Follow-up: Return in about 3 months (around 05/07/2017) for anemia.   Loletta Specter PA

## 2017-02-05 NOTE — Telephone Encounter (Signed)
FWD to PCP. Tempestt S Roberts, CMA  

## 2017-02-05 NOTE — Telephone Encounter (Signed)
Pt mother is requesting to refer her daughter to a GYN since she need a PAP, please follow up

## 2017-02-05 NOTE — Telephone Encounter (Signed)
I have made the referral.

## 2017-02-07 ENCOUNTER — Telehealth (INDEPENDENT_AMBULATORY_CARE_PROVIDER_SITE_OTHER): Payer: Self-pay

## 2017-02-07 ENCOUNTER — Other Ambulatory Visit (INDEPENDENT_AMBULATORY_CARE_PROVIDER_SITE_OTHER): Payer: Self-pay | Admitting: Physician Assistant

## 2017-02-07 DIAGNOSIS — E538 Deficiency of other specified B group vitamins: Secondary | ICD-10-CM

## 2017-02-07 LAB — B12 AND FOLATE PANEL
FOLATE: 7.5 ng/mL (ref >3.0)
Vitamin B-12: 185 pg/mL — ABNORMAL LOW (ref 232–1245)

## 2017-02-07 MED ORDER — VITAMIN B-12 500 MCG PO TABS: 500.0000 ug | ORAL_TABLET | Freq: Every day | ORAL | 2 refills | Status: DC

## 2017-02-07 NOTE — Telephone Encounter (Signed)
-----   Message from Loletta Specter, PA-C sent at 02/07/2017  8:41 AM EDT ----- Vit B12 mildly low. I will send out prescription.

## 2017-02-07 NOTE — Telephone Encounter (Signed)
Patient is aware of low Vit B12, and Rx being sent to pharmacy. [name], CMA

## 2017-03-04 DIAGNOSIS — Z6841 Body Mass Index (BMI) 40.0 and over, adult: Secondary | ICD-10-CM | POA: Diagnosis not present

## 2017-03-04 DIAGNOSIS — R5383 Other fatigue: Secondary | ICD-10-CM | POA: Diagnosis not present

## 2017-03-04 DIAGNOSIS — J90 Pleural effusion, not elsewhere classified: Secondary | ICD-10-CM | POA: Diagnosis not present

## 2017-03-04 DIAGNOSIS — Z23 Encounter for immunization: Secondary | ICD-10-CM | POA: Diagnosis not present

## 2017-03-04 DIAGNOSIS — M329 Systemic lupus erythematosus, unspecified: Secondary | ICD-10-CM | POA: Diagnosis not present

## 2017-03-04 DIAGNOSIS — E0500 Thyrotoxicosis with diffuse goiter without thyrotoxic crisis or storm: Secondary | ICD-10-CM | POA: Diagnosis not present

## 2017-03-04 DIAGNOSIS — D508 Other iron deficiency anemias: Secondary | ICD-10-CM | POA: Diagnosis not present

## 2017-03-04 DIAGNOSIS — Z3002 Counseling and instruction in natural family planning to avoid pregnancy: Secondary | ICD-10-CM | POA: Diagnosis not present

## 2017-03-04 DIAGNOSIS — I313 Pericardial effusion (noninflammatory): Secondary | ICD-10-CM | POA: Diagnosis not present

## 2017-03-04 DIAGNOSIS — G319 Degenerative disease of nervous system, unspecified: Secondary | ICD-10-CM | POA: Diagnosis not present

## 2017-03-12 ENCOUNTER — Other Ambulatory Visit (INDEPENDENT_AMBULATORY_CARE_PROVIDER_SITE_OTHER): Payer: Self-pay | Admitting: Physician Assistant

## 2017-03-12 ENCOUNTER — Ambulatory Visit (INDEPENDENT_AMBULATORY_CARE_PROVIDER_SITE_OTHER): Payer: Medicare Other | Admitting: Physician Assistant

## 2017-03-12 ENCOUNTER — Encounter (INDEPENDENT_AMBULATORY_CARE_PROVIDER_SITE_OTHER): Payer: Self-pay | Admitting: Physician Assistant

## 2017-03-12 VITALS — BP 132/91 | HR 82 | Temp 98.0°F | Wt 251.4 lb

## 2017-03-12 DIAGNOSIS — R42 Dizziness and giddiness: Secondary | ICD-10-CM | POA: Diagnosis not present

## 2017-03-12 DIAGNOSIS — R519 Headache, unspecified: Secondary | ICD-10-CM

## 2017-03-12 DIAGNOSIS — R51 Headache: Secondary | ICD-10-CM

## 2017-03-12 DIAGNOSIS — R112 Nausea with vomiting, unspecified: Secondary | ICD-10-CM | POA: Diagnosis not present

## 2017-03-12 MED ORDER — MECLIZINE HCL 25 MG PO TABS: 25.0000 mg | ORAL_TABLET | Freq: Two times a day (BID) | ORAL | 0 refills | Status: DC | PRN

## 2017-03-12 MED ORDER — NAPROXEN 500 MG PO TABS: 500.0000 mg | ORAL_TABLET | Freq: Two times a day (BID) | ORAL | 0 refills | Status: DC

## 2017-03-12 MED ORDER — PROMETHAZINE HCL 25 MG PO TABS: 25.0000 mg | ORAL_TABLET | Freq: Three times a day (TID) | ORAL | 0 refills | Status: DC | PRN

## 2017-03-12 NOTE — Patient Instructions (Signed)

## 2017-03-12 NOTE — Progress Notes (Signed)
Subjective:  Patient ID: Erika Boyer, female    DOB: 09-08-1995  Age: 21 y.o. MRN: [id number]  CC: f/u anemia   HPI  Erika Boyer a 20 y.o.femalewith a PMH of anxiety, depression, anemia, Graves Disease, HTN, Lupus, and Cerebellar degeneration presents with episodic dizziness, nausea w/vomiting, and headache. "Dizziness" began 10 minutes ago. States she has episodic dizziness "throughout my life". Dizziness is not out of baseline. Nausea and vomiting reported as a consequence of the dizziness. Headache is described as frontal pressure. Has mild congestion, sneezing, and cough. Thinks she is at the onset of a cold. Does not endorse fever, chills, sore throat, SOB, CP, rash, GI sxs, GU sxs.     Outpatient Medications Prior to Visit  Medication Sig Dispense Refill  . albuterol (PROAIR HFA) 108 (90 Base) MCG/ACT inhaler Inhale 2 puffs into the lungs every 4 (four) hours as needed for wheezing or shortness of breath.    . EPINEPHrine 0.3 mg/0.3 mL IJ SOAJ injection USE AS DIRECTED AND THEN CALL 911    . multivitamin (PROSIGHT) TABS tablet Take 1 tablet by mouth daily. 30 each 0  . pantoprazole (PROTONIX) 20 MG tablet Take 1 tablet (20 mg total) by mouth daily. 30 tablet 1  . vitamin B-12 (CYANOCOBALAMIN) 500 MCG tablet Take 1 tablet (500 mcg total) by mouth daily. 30 tablet 2   No facility-administered medications prior to visit.      ROS Review of Systems  Constitutional: Negative for chills, fever and malaise/fatigue.  HENT: Positive for congestion. Negative for ear pain, sinus pain and sore throat.   Eyes: Negative for blurred vision.  Respiratory: Negative for shortness of breath.   Cardiovascular: Negative for chest pain and palpitations.  Gastrointestinal: Negative for abdominal pain and nausea.  Genitourinary: Negative for dysuria and hematuria.  Musculoskeletal: Negative for joint pain and myalgias.  Skin: Negative for rash.  Neurological: Positive for dizziness  and headaches. Negative for tingling.  Psychiatric/Behavioral: Negative for depression. The patient is not nervous/anxious.     Objective:  Wt 251 lb 6.4 oz (114 kg)   LMP 03/03/2017 (Exact Date)   BMI 40.58 kg/m   BP/Weight 03/12/2017 02/05/2017 02/04/2017  Systolic BP - 124 130  Diastolic BP - 87 92  Wt. (Lbs) 251.4 249.6 -  BMI 40.58 40.29 -      Physical Exam  Constitutional: She is oriented to person, place, and time.  Well developed, obese, NAD, polite  HENT:  Head: Normocephalic and atraumatic.  Eyes: No scleral icterus.  Neck: Normal range of motion.  Cardiovascular: Normal rate, regular rhythm and normal heart sounds.   Pulmonary/Chest: Effort normal and breath sounds normal.  Musculoskeletal: She exhibits no edema.  Lymphadenopathy:    She has no cervical adenopathy.  Neurological: She is alert and oriented to person, place, and time. No cranial nerve deficit. Coordination normal.  Slower speech, not outside of baseline.  Skin: Skin is warm and dry. No rash noted. No erythema. No pallor.  Psychiatric: She has a normal mood and affect. Her behavior is normal. Thought content normal.  Vitals reviewed.    Assessment & Plan:    1. Nonintractable headache, unspecified chronicity pattern, unspecified headache type - Begin naproxen (NAPROSYN) 500 MG tablet; Take 1 tablet (500 mg total) by mouth 2 (two) times daily with a meal.  Dispense: 30 tablet; Refill: 0  2. Non-intractable vomiting with nausea, unspecified vomiting type - Begin promethazine (PHENERGAN) 25 MG tablet; Take 1 tablet (25 mg total) by  mouth every 8 (eight) hours as needed for nausea or vomiting.  Dispense: 90 tablet; Refill: 0  3. Lightheadedness - Begin meclizine (ANTIVERT) 25 MG tablet; Take 1 tablet (25 mg total) by mouth 2 (two) times daily as needed for dizziness.  Dispense: 60 tablet; Refill: 0   Meds ordered this encounter  Medications  . naproxen (NAPROSYN) 500 MG tablet    Sig: Take 1  tablet (500 mg total) by mouth 2 (two) times daily with a meal.    Dispense:  30 tablet    Refill:  0    Order Specific Question:   Supervising Provider    Answer:   Quentin AngstJEGEDE, OLUGBEMIGA E [id number]  . promethazine (PHENERGAN) 25 MG tablet    Sig: Take 1 tablet (25 mg total) by mouth every 8 (eight) hours as needed for nausea or vomiting.    Dispense:  90 tablet    Refill:  0    Order Specific Question:   Supervising Provider    Answer:   Quentin AngstJEGEDE, OLUGBEMIGA E [id number]  . meclizine (ANTIVERT) 25 MG tablet    Sig: Take 1 tablet (25 mg total) by mouth 2 (two) times daily as needed for dizziness.    Dispense:  60 tablet    Refill:  0    Order Specific Question:   Supervising Provider    Answer:   Quentin AngstJEGEDE, OLUGBEMIGA E [[id number]]    Follow-up: PRN  Loletta Specteroger David Gomez PA

## 2017-03-15 ENCOUNTER — Other Ambulatory Visit (HOSPITAL_COMMUNITY)
Admission: RE | Admit: 2017-03-15 | Discharge: 2017-03-15 | Disposition: A | Discharge status: Home / Self Care | Payer: Medicare Other | Source: Ambulatory Visit | Attending: Family | Admitting: Family

## 2017-03-15 ENCOUNTER — Ambulatory Visit (INDEPENDENT_AMBULATORY_CARE_PROVIDER_SITE_OTHER): Payer: Medicare Other | Admitting: Family

## 2017-03-15 ENCOUNTER — Encounter: Payer: Self-pay | Admitting: Family

## 2017-03-15 VITALS — BP 135/86 | HR 62 | Ht 65.0 in | Wt 251.6 lb

## 2017-03-15 DIAGNOSIS — Z01419 Encounter for gynecological examination (general) (routine) without abnormal findings: Secondary | ICD-10-CM

## 2017-03-15 DIAGNOSIS — R87810 Cervical high risk human papillomavirus (HPV) DNA test positive: Secondary | ICD-10-CM | POA: Insufficient documentation

## 2017-03-15 DIAGNOSIS — Z124 Encounter for screening for malignant neoplasm of cervix: Secondary | ICD-10-CM

## 2017-03-15 DIAGNOSIS — Z113 Encounter for screening for infections with a predominantly sexual mode of transmission: Secondary | ICD-10-CM

## 2017-03-15 NOTE — Progress Notes (Signed)
  Subjective:     Erika Boyer is a 21 y.o. female here for a routine exam.  Receive primary care at [facility]. Referred to our practice for well-woman exam.  Last screening for infections was in May 2018.  Desires re-screening for infections.  Current complaints: Pt denies vaginal itching, odor, or lesions.  Patient's last menstrual period was 03/03/2017 (exact date).   Reports intercourse last night with a female friend who lives by her.  Sexual intercourse prior to that was 4 days ago.  Total partners in past year was 2.  No birth control or condom used.  Reports using IUD, depo, Nexplanon, and NuvaRing in the past.  Does not desire pregnancy, but "ok" if it happens. Lives with mother at this time.  Personal health questionnaire reviewed: yes.   Gynecologic History Patient's last menstrual period was 03/03/2017 (exact date). Contraception: none Last Pap: Never.  Last mammogram: n/a  Obstetric History OB History  No data available   Family History  Problem Relation Age of Onset  . Thalassemia Mother   . Arthritis Mother   . Breast cancer Mother        Mastectomy in early 23's.    . High blood pressure Father   . Mental illness Father        question bipolar disorder per wife  . Sickle cell trait Maternal Aunt   . Breast cancer Maternal Aunt        4 maternal aunts w/breast cancer  . Breast cancer Maternal Grandmother        Bone  . Multiple sclerosis Cousin     The following portions of the patient's history were reviewed and updated as appropriate: allergies, current medications, past family history, past medical history, past social history, past surgical history and problem list.  Review of Systems Pertinent items are noted in HPI.    Objective:     BP 135/86   Pulse 62   Ht _0  (1.651 m)   Wt 251 lb 9.6 oz (114.1 kg)   LMP 03/03/2017 (Exact Date)   BMI 41.87 kg/m  General appearance: alert, cooperative and appears stated age Head:  Normocephalic, without obvious abnormality, atraumatic Neck: no adenopathy, no carotid bruit, no JVD, supple, symmetrical, trachea midline and thyroid not enlarged, symmetric, no tenderness/mass/nodules Lungs: clear to auscultation bilaterally Breasts: normal appearance, no masses or tenderness, No nipple retraction or dimpling, No nipple discharge or bleeding, No axillary or supraclavicular adenopathy, Normal to palpation without dominant masses, Taught monthly breast self examination Heart: regular rate and rhythm, S1, S2 normal, no murmur, click, rub or gallop Abdomen: soft, non-tender; bowel sounds normal; no masses,  no organomegaly Pelvic: cervix normal in appearance, external genitalia normal, no adnexal masses or tenderness, no cervical motion tenderness, rectovaginal septum normal, uterus normal size, shape, and consistency.  +yellowish vaginal discharge.   Skin: Skin color, texture, turgor normal. No rashes or lesions      Assessment:      Well Woman Exam   STD Screening  Plan:    Education reviewed: safe sex/STD prevention and self breast exams. Pt declined both birth control and plan B Pap smear sent w/co-testing GC/CT, Trich Plans to return for BRCA testing due to family hx of breast cancer (mother, M-GM, M-aunts x 4) Contraception: none. Follow up in: 3 weeks.     Gwen Pounds, CNM

## 2017-03-15 NOTE — Progress Notes (Signed)
Offer Flu and HPV

## 2017-03-19 LAB — CYTOLOGY - PAP
DIAGNOSIS: NEGATIVE
HPV: DETECTED — AB
TRICH (WINDOWPATH): NEGATIVE

## 2017-03-20 ENCOUNTER — Encounter: Payer: Self-pay | Admitting: Family

## 2017-03-20 DIAGNOSIS — R87810 Cervical high risk human papillomavirus (HPV) DNA test positive: Secondary | ICD-10-CM

## 2017-03-20 HISTORY — DX: Cervical high risk human papillomavirus (HPV) DNA test positive: R87.810

## 2017-03-25 ENCOUNTER — Telehealth (HOSPITAL_COMMUNITY): Payer: Self-pay

## 2017-03-25 NOTE — Telephone Encounter (Signed)
Called patient to get her scheduled for an appointment to have BRCA testing, no answer, left voicemail instructing patient to return my call at the office.

## 2017-04-28 ENCOUNTER — Telehealth: Payer: Self-pay | Admitting: *Deleted

## 2017-04-28 NOTE — Telephone Encounter (Signed)
Called and spoke with the patient. She verbalized understanding that our office is closed on Monday 12/10 due to the snow and we will call ASAP to reschedule her appointment.  -the number on pt's DPR for detailed message has been changed. Patient stated she would fill out a new DPR at her next appointment.

## 2017-04-29 ENCOUNTER — Ambulatory Visit: Payer: Medicare Other | Admitting: Neurology

## 2017-04-30 ENCOUNTER — Encounter (HOSPITAL_COMMUNITY): Payer: Self-pay | Admitting: Emergency Medicine

## 2017-04-30 ENCOUNTER — Ambulatory Visit (HOSPITAL_COMMUNITY)
Admission: EM | Admit: 2017-04-30 | Discharge: 2017-04-30 | Disposition: A | Discharge status: Home / Self Care | Payer: Medicare Other | Attending: Internal Medicine | Admitting: Internal Medicine

## 2017-04-30 DIAGNOSIS — X102XXA Contact with fats and cooking oils, initial encounter: Secondary | ICD-10-CM

## 2017-04-30 DIAGNOSIS — T23102A Burn of first degree of left hand, unspecified site, initial encounter: Secondary | ICD-10-CM | POA: Diagnosis not present

## 2017-04-30 DIAGNOSIS — T23122A Burn of first degree of single left finger (nail) except thumb, initial encounter: Secondary | ICD-10-CM

## 2017-04-30 MED ORDER — SILVER SULFADIAZINE 1 % EX CREA: 1.0000 "application " | TOPICAL_CREAM | Freq: Every day | CUTANEOUS | 0 refills | Status: DC

## 2017-04-30 MED ORDER — ACETAMINOPHEN 325 MG PO TABS
650.0000 mg | ORAL_TABLET | Freq: Once | ORAL | Status: AC
  Administered 2017-04-30: 650 mg via ORAL

## 2017-04-30 MED ORDER — ACETAMINOPHEN 325 MG PO TABS
ORAL_TABLET | ORAL | Status: AC
  Filled 2017-04-30: qty 2

## 2017-04-30 MED ORDER — SILVER SULFADIAZINE 1 % EX CREA
TOPICAL_CREAM | Freq: Once | CUTANEOUS | Status: AC
  Administered 2017-04-30: 17:00:00 via TOPICAL
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
## 2017-04-30 NOTE — Discharge Instructions (Addendum)
You may use silvadene cream on burn, wrap with gauze.  Keep blisters intact as long as you can.   You may take Tylenol or Ibuprofen for pain.  Burn should slowly improve over the next 2 weeks as skin is replaced.

## 2017-05-01 NOTE — Telephone Encounter (Signed)
Called and spoke with pt & pt's mother ([place] Appt r/s for Friday 12/14 @ 3:00 with an arrival time of 2:30 pm. Verbalized understanding.

## 2017-05-02 ENCOUNTER — Telehealth: Payer: Self-pay | Admitting: Neurology

## 2017-05-02 NOTE — Telephone Encounter (Signed)
Patient is stable no changes. She would like a companion dog. I am happy to fill out any documentation as long as she drops off specifics, what organization she wants to go through, what documentation is needed ec. Will cancel appointment tomorrow and patient will drop of information when she has it thanks.   Toma CopierBethany, please cancel appointment tomorrow thanks

## 2017-05-02 NOTE — Progress Notes (Deleted)
[facility]    Provider:  Dr Lucia GaskinsAhern Referring Provider: Loletta SpecterGomez, Roger David, PA-C Primary Care Physician:  Loletta SpecterGomez, Roger David, PA-C  CC:  ***  HPI:  Erika Boyer is a 21 y.o. female here as a referral from Dr. Lily KocherGomez for ***.  ***  Reviewed notes, labs and imaging from outside physicians, which showed ***  Review of Systems: Patient complains of symptoms per HPI as well as the following symptoms ***. Pertinent negatives and positives per HPI. All others negative.   Social History   Socioeconomic History  . Marital status: Single    Spouse name: Not on file  . Number of children: Not on file  . Years of education: Not on file  . Highest education level: Not on file  Social Needs  . Financial resource strain: Not on file  . Food insecurity - worry: Not on file  . Food insecurity - inability: Not on file  . Transportation needs - medical: Not on file  . Transportation needs - non-medical: Not on file  Occupational History  . Not on file  Tobacco Use  . Smoking status: Never Smoker  . Smokeless tobacco: Never Used  Substance and Sexual Activity  . Alcohol use: No  . Drug use: No  . Sexual activity: Yes  Other Topics Concern  . Not on file  Social History Narrative   Lives at home w/ her mom   Right-handed   Caffeine: occasional coffee    Family History  Problem Relation Age of Onset  . Thalassemia Mother   . Arthritis Mother   . Breast cancer Mother        Mastectomy in early 2430's.    . High blood pressure Father   . Mental illness Father        question bipolar disorder per wife  . Sickle cell trait Maternal Aunt   . Breast cancer Maternal Aunt        4 maternal aunts w/breast cancer  . Breast cancer Maternal Grandmother        Bone  . Multiple sclerosis Cousin     Past Medical History:  Diagnosis Date  . Anemia   . Anxiety   . Arthritis    "right knee" (10/11/2016)  . Cerebellar degeneration   . Chronic bronchitis (HCC)   .  Daily headache   . Depression   . Graves disease 2012   hx; "was on RX; it disappeared" (10/11/2016)  . History of blood transfusion    "related to the anemia" (10/11/2016)  . Hypertension   . Lupus    "not sure what kind" (10/11/2016)  . Pneumonia 10/03/2016    Past Surgical History:  Procedure Laterality Date  . PERICARDIOCENTESIS N/A 10/04/2016   Procedure: Pericardiocentesis;  Surgeon: Yates DecampGanji, Jay, MD;  [facility];  Service: Cardiovascular;  Laterality: N/A;  . REDUCTION MAMMAPLASTY Bilateral 2012  . TONSILLECTOMY      Current Outpatient Medications  Medication Sig Dispense Refill  . albuterol (PROAIR HFA) 108 (90 Base) MCG/ACT inhaler Inhale 2 puffs into the lungs every 4 (four) hours as needed for wheezing or shortness of breath.    . EPINEPHrine 0.3 mg/0.3 mL IJ SOAJ injection USE AS DIRECTED AND THEN CALL 911    . hydroxychloroquine (PLAQUENIL) 200 MG tablet TK 2 TS PO QD WF OR MILK  2  . meclizine (ANTIVERT) 25 MG tablet Take 1 tablet (25 mg total) by mouth 2 (two) times daily as needed for dizziness.  60 tablet 0  . multivitamin (PROSIGHT) TABS tablet Take 1 tablet by mouth daily. 30 each 0  . naproxen (NAPROSYN) 500 MG tablet Take 1 tablet (500 mg total) by mouth 2 (two) times daily with a meal. (Patient not taking: Reported on 03/15/2017) 30 tablet 0  . pantoprazole (PROTONIX) 20 MG tablet Take 1 tablet (20 mg total) by mouth daily. 30 tablet 1  . predniSONE (DELTASONE) 10 MG tablet Take 5 mg by mouth daily with breakfast.    . promethazine (PHENERGAN) 25 MG tablet Take 1 tablet (25 mg total) by mouth every 8 (eight) hours as needed for nausea or vomiting. 90 tablet 0  . silver sulfADIAZINE (SILVADENE) 1 % cream Apply 1 application topically daily. 50 g 0  . vitamin B-12 (CYANOCOBALAMIN) 500 MCG tablet Take 1 tablet (500 mcg total) by mouth daily. 30 tablet 2   No current facility-administered medications for this visit.     Allergies as of 05/03/2017 -  Review Complete 04/30/2017  Allergen Reaction Noted  . Shellfish allergy Anaphylaxis 10/03/2016  . Morphine and related Itching 08/23/2016  . Mushroom extract complex Other (See Comments) 10/03/2016  . Tapazole [methimazole] Swelling and Rash 10/03/2016    Vitals: LMP 04/16/2017  Last Weight:  Wt Readings from Last 1 Encounters:  04/30/17 250 lb (113.4 kg)   Last Height:   Ht Readings from Last 1 Encounters:  04/30/17 5\' 5"  (1.651 m)         Assessment/Plan:    @[email or web address] @  Naomie DeanAntonia Ahern, MD  [facility] [address] [place], [place] [id number]  Phone [phone] Fax [phone]  A total of *** minutes was spent face-to-face with this patient. Over half this time was spent on counseling patient on the *** diagnosis and different diagnostic and therapeutic options available.   greater than 50% of the visit was spent in counseling and coordination of care  Even stable problems: CHF:  Remains stable today  .Diagnostic results, impressions, or recommended diagnostic studies, .Prognosis, .Risks and benefits of management (treatment) options, .Instructions for management (treatment) or follow-up, .Importance of compliance with chosen management (treatment) options, .Risk factor reduction, .Patient and family education

## 2017-05-03 ENCOUNTER — Ambulatory Visit: Payer: Self-pay | Admitting: Neurology

## 2017-05-06 DIAGNOSIS — I313 Pericardial effusion (noninflammatory): Secondary | ICD-10-CM | POA: Diagnosis not present

## 2017-05-06 DIAGNOSIS — Z6841 Body Mass Index (BMI) 40.0 and over, adult: Secondary | ICD-10-CM | POA: Diagnosis not present

## 2017-05-06 DIAGNOSIS — M329 Systemic lupus erythematosus, unspecified: Secondary | ICD-10-CM | POA: Diagnosis not present

## 2017-05-06 DIAGNOSIS — J90 Pleural effusion, not elsewhere classified: Secondary | ICD-10-CM | POA: Diagnosis not present

## 2017-05-06 DIAGNOSIS — G319 Degenerative disease of nervous system, unspecified: Secondary | ICD-10-CM | POA: Diagnosis not present

## 2017-05-07 ENCOUNTER — Other Ambulatory Visit: Payer: Self-pay

## 2017-05-07 ENCOUNTER — Ambulatory Visit (INDEPENDENT_AMBULATORY_CARE_PROVIDER_SITE_OTHER): Payer: Medicare Other | Admitting: Physician Assistant

## 2017-05-07 ENCOUNTER — Encounter (INDEPENDENT_AMBULATORY_CARE_PROVIDER_SITE_OTHER): Payer: Self-pay | Admitting: Physician Assistant

## 2017-05-07 VITALS — BP 123/84 | HR 87 | Temp 98.0°F | Wt 269.2 lb

## 2017-05-07 DIAGNOSIS — E538 Deficiency of other specified B group vitamins: Secondary | ICD-10-CM | POA: Diagnosis not present

## 2017-05-07 DIAGNOSIS — D649 Anemia, unspecified: Secondary | ICD-10-CM

## 2017-05-07 DIAGNOSIS — Z79899 Other long term (current) drug therapy: Secondary | ICD-10-CM | POA: Diagnosis not present

## 2017-05-07 NOTE — Patient Instructions (Signed)

## 2017-05-07 NOTE — Progress Notes (Signed)
Subjective:  Patient ID: Erika Boyer, female    DOB: 27-Sep-1995  Age: 21 y.o. MRN: [id number]  CC: f/u anemia  HPI  Erika Iziokhaiis a 20 y.o.femalewith a PMH of anxiety, depression, anemia, Graves Disease, HTN, Lupus, and Cerebellar degeneration presents to f/u on anemia. Last Hgb 9.2 g/dL in 0/8/6/578/06/21/16. Found to be deficient in Vit B12. Normal folate. Vit B12 prescribed but patient not taking as she did not know she had to supplement Vit B12. Patient reports feeling well. Does not endorse CP, palpitations, SOB, HA, abdominal pain, f/c/n/v, rash, fatigue, or GI/GU sxs.     Outpatient Medications Prior to Visit  Medication Sig Dispense Refill  . EPINEPHrine 0.3 mg/0.3 mL IJ SOAJ injection USE AS DIRECTED AND THEN CALL 911    . hydroxychloroquine (PLAQUENIL) 200 MG tablet TK 2 TS PO QD WF OR MILK  2  . predniSONE (DELTASONE) 10 MG tablet Take 5 mg by mouth daily with breakfast.    . silver sulfADIAZINE (SILVADENE) 1 % cream Apply 1 application topically daily. 50 g 0  . albuterol (PROAIR HFA) 108 (90 Base) MCG/ACT inhaler Inhale 2 puffs into the lungs every 4 (four) hours as needed for wheezing or shortness of breath.    . meclizine (ANTIVERT) 25 MG tablet Take 1 tablet (25 mg total) by mouth 2 (two) times daily as needed for dizziness. (Patient not taking: Reported on 05/07/2017) 60 tablet 0  . multivitamin (PROSIGHT) TABS tablet Take 1 tablet by mouth daily. (Patient not taking: Reported on 05/07/2017) 30 each 0  . naproxen (NAPROSYN) 500 MG tablet Take 1 tablet (500 mg total) by mouth 2 (two) times daily with a meal. (Patient not taking: Reported on 03/15/2017) 30 tablet 0  . pantoprazole (PROTONIX) 20 MG tablet Take 1 tablet (20 mg total) by mouth daily. (Patient not taking: Reported on 05/07/2017) 30 tablet 1  . promethazine (PHENERGAN) 25 MG tablet Take 1 tablet (25 mg total) by mouth every 8 (eight) hours as needed for nausea or vomiting. (Patient not taking: Reported on  05/07/2017) 90 tablet 0  . vitamin B-12 (CYANOCOBALAMIN) 500 MCG tablet Take 1 tablet (500 mcg total) by mouth daily. (Patient not taking: Reported on 05/07/2017) 30 tablet 2   No facility-administered medications prior to visit.      ROS Review of Systems  Constitutional: Negative for chills, fever and malaise/fatigue.  Eyes: Negative for blurred vision.  Respiratory: Negative for shortness of breath.   Cardiovascular: Negative for chest pain and palpitations.  Gastrointestinal: Negative for abdominal pain and nausea.  Genitourinary: Negative for dysuria and hematuria.  Musculoskeletal: Negative for joint pain and myalgias.  Skin: Negative for rash.  Neurological: Negative for tingling and headaches.  Psychiatric/Behavioral: Negative for depression. The patient is not nervous/anxious.     Objective:  BP 123/84 (BP Location: Left Arm, Patient Position: Sitting, Cuff Size: Large)   Pulse 87   Temp 98 F (36.7 C) (Oral)   Wt 269 lb 3.2 oz (122.1 kg)   LMP 04/16/2017 (Exact Date)   SpO2 99%   BMI 44.80 kg/m   BP/Weight 05/07/2017 04/30/2017 03/15/2017  Systolic BP 123 130 135  Diastolic BP 84 79 86  Wt. (Lbs) 269.2 250 251.6  BMI 44.8 41.6 41.87      Physical Exam  Constitutional: She is oriented to person, place, and time.  Well developed, well nourished, NAD, polite  HENT:  Head: Normocephalic and atraumatic.  Eyes: No scleral icterus.  Neck: Normal range of motion.  Neck supple. No thyromegaly present.  Cardiovascular: Normal rate, regular rhythm and normal heart sounds.  Pulmonary/Chest: Effort normal and breath sounds normal.  Musculoskeletal: She exhibits no edema.  Neurological: She is alert and oriented to person, place, and time. No cranial nerve deficit. Coordination normal.  Gait imbalance  Skin: Skin is warm and dry. No rash noted. No erythema. No pallor.  Psychiatric: She has a normal mood and affect. Her behavior is normal. Thought content normal.   Vitals reviewed.    Assessment & Plan:    1. Anemia, unspecified type - CBC with Differential  2. Vitamin B12 deficiency - Vitamin B12  3. High risk medication use - Glucose 6 phosphate dehydrogenase. (Plaquenil may cause hemolysis in G6PD patients).      Follow-up: 3 months for anemia  Erika Boyer David Karsen Nakanishi PA

## 2017-05-08 LAB — VITAMIN B12: VITAMIN B 12: 275 pg/mL (ref 232–1245)

## 2017-05-08 LAB — CBC WITH DIFFERENTIAL/PLATELET
BASOS ABS: 0 10*3/uL (ref 0.0–0.2)
Basos: 0 %
EOS (ABSOLUTE): 0 10*3/uL (ref 0.0–0.4)
Eos: 1 %
Hematocrit: 36.6 % (ref 34.0–46.6)
Hemoglobin: 11.6 g/dL (ref 11.1–15.9)
IMMATURE GRANS (ABS): 0 10*3/uL (ref 0.0–0.1)
IMMATURE GRANULOCYTES: 0 %
LYMPHS: 25 %
Lymphocytes Absolute: 0.8 10*3/uL (ref 0.7–3.1)
MCH: 25.1 pg — AB (ref 26.6–33.0)
MCHC: 31.7 g/dL (ref 31.5–35.7)
MCV: 79 fL (ref 79–97)
Monocytes Absolute: 0.3 10*3/uL (ref 0.1–0.9)
Monocytes: 9 %
NEUTROS ABS: 2.1 10*3/uL (ref 1.4–7.0)
NEUTROS PCT: 65 %
PLATELETS: 271 10*3/uL (ref 150–379)
RBC: 4.63 x10E6/uL (ref 3.77–5.28)
RDW: 15.5 % — ABNORMAL HIGH (ref 12.3–15.4)
WBC: 3.2 10*3/uL — AB (ref 3.4–10.8)

## 2017-05-08 LAB — GLUCOSE 6 PHOSPHATE DEHYDROGENASE: G-6-PD, QUANT: 7.6 U/g{Hb} (ref 4.6–13.5)

## 2017-05-09 ENCOUNTER — Telehealth (INDEPENDENT_AMBULATORY_CARE_PROVIDER_SITE_OTHER): Payer: Self-pay

## 2017-05-09 NOTE — Telephone Encounter (Signed)
Patient aware of normal B12 and not anemic, patient also is aware that she does not have the G6PD enzyme deficiency. Erika Boyer Nourse

## 2017-05-09 NOTE — Telephone Encounter (Signed)
-----   Message from Loletta Specteroger David Gomez, PA-C sent at 05/08/2017  4:56 PM EST ----- Does not have G6PD enzyme deficiency. Not anemic and Vit B12 is normal.

## 2017-06-18 ENCOUNTER — Encounter: Payer: Self-pay | Admitting: Neurology

## 2017-06-18 ENCOUNTER — Ambulatory Visit (INDEPENDENT_AMBULATORY_CARE_PROVIDER_SITE_OTHER): Payer: Medicare Other | Admitting: Neurology

## 2017-06-18 DIAGNOSIS — F419 Anxiety disorder, unspecified: Secondary | ICD-10-CM

## 2017-06-18 NOTE — Progress Notes (Signed)
[facility]    Provider:  Dr Lucia GaskinsAhern Referring Provider: Loletta SpecterGomez, Roger David, PA-C Primary Care Physician:  Loletta SpecterGomez, Roger David, PA-C  CC:  Cerebellar Degeneration  Interval history 06/18/2017: New problem today. Anxiety is worsening. As a result of her Illness and disability. She is very anxious, her disability affectes her, she is self conscious. She has mood swings due to this and gets agitated. Since the cat died she is having more emotional problems. Animals calm her, she is happier with animals and more interactive. She improved in the past. Recently she is more moody, more quick to anger. Lots of anxiety. She is having difficulty sleeping due to anxiety. She gets upset. Mother is here and provides most information. Patient declines any medication management for anxiety.   HPI:  Baxter FlatteryOmaure Galeno is a 22 y.o. female here as a referral from Dr. Lily KocherGomez for cerebellar atrophy. Past medical history of morbid obesity, acute kidney injury, drug-induced lupus erythematosus, cognitive disorder, ligament hypertension, cerebellar atrophy. 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 MRI indicated cerebellar/pontine degeneration. Patient was worked up in the past possibly with genetics not clear from records. Cerebellar degeneration of unknown etiology, multiple needs. She has been evaluated at [facility] in [place]. She was seeing neurology for about 5 years there. They did genetic testing. She was diagnosed with post-viral cerebellar degeneration. She just started falling at the age 22. She last fell a few days ago.  She has physical therapy to her house. Since being out of the hospital she feels better. She is taking a lot of medication. Physical therapy is helping. She feels her balance is better. Her writing is better. She gets tremors, they are off and on. She gets tremors when she  tries to concentrate. They are in her head. The tremor does not bother her. It doesn't affect her, she gets double which is chronic, no problems speaking, she speaks slowly. She graduated HS. She didn;t like college.  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 MRI indicated cerebellar/pontine degeneration. Patient was scheduled for genetic testing but no results available. Difficulty with double vision which lasts a few seconds. Decreased trunk control with body sway, broad-based stance, decreased lordosis.Reviewed MRi report, Somewhat isolated severe cerebellar atrophy similar to 2013.  Potential causes include but not limited to medication/substance toxicitiy, prior cerebellitis, paraneoplastic or other autoimmune disorder, hereditary ataxias, and JC virus related granule cell neuronopathy.   Review of Systems: Patient complains of symptoms per HPI as well as the following symptoms: tremors, double vision. Pertinent negatives and positives per HPI. All others negative.   Social History   Socioeconomic History  . Marital status: Single    Spouse name: Not on file  . Number of children: 0  . Years of education: 7314  . Highest education level: Some college, no degree  Social Needs  . Financial resource strain: Not on file  . Food insecurity - worry: Not on file  . Food insecurity - inability: Not on file  . Transportation needs - medical: Not on file  . Transportation needs - non-medical: Not on file  Occupational History  . Not on file  Tobacco Use  . Smoking status: Never Smoker  .  Smokeless tobacco: Never Used  Substance and Sexual Activity  . Alcohol use: No  . Drug use: No  . Sexual activity: Yes  Other Topics Concern  . Not on file  Social  History Narrative   Lives at home w/ her mom   Right-handed   Caffeine: occasional coffee    Family History  Problem Relation Age of Onset  . Thalassemia Mother   . Arthritis Mother   . Breast cancer Mother        Mastectomy in early 73's.    . High blood pressure Father   . Mental illness Father        question bipolar disorder per wife  . Sickle cell trait Maternal Aunt   . Breast cancer Maternal Aunt        4 maternal aunts w/breast cancer  . Breast cancer Maternal Grandmother        Bone  . Multiple sclerosis Cousin     Past Medical History:  Diagnosis Date  . Anemia   . Anxiety   . Arthritis    "right knee" (10/11/2016)  . Cerebellar degeneration   . Chronic bronchitis (HCC)   . Daily headache   . Depression   . Graves disease 2012   hx; "was on RX; it disappeared" (10/11/2016)  . History of blood transfusion    "related to the anemia" (10/11/2016)  . Hypertension   . Lupus    "not sure what kind" (10/11/2016)  . Pneumonia 10/03/2016    Past Surgical History:  Procedure Laterality Date  . PERICARDIOCENTESIS N/A 10/04/2016   Procedure: Pericardiocentesis;  Surgeon: Yates Decamp, MD;  Location: [facility] INVASIVE CV LAB;  Service: Cardiovascular;  Laterality: N/A;  . REDUCTION MAMMAPLASTY Bilateral 2012  . TONSILLECTOMY      Current Outpatient Medications  Medication Sig Dispense Refill  . hydroxychloroquine (PLAQUENIL) 200 MG tablet TK 2 TS PO QD WF OR MILK  2  . predniSONE (DELTASONE) 10 MG tablet Take 5 mg by mouth daily with breakfast.    . albuterol (PROAIR HFA) 108 (90 Base) MCG/ACT inhaler Inhale 2 puffs into the lungs every 4 (four) hours as needed for wheezing or shortness of breath.    . EPINEPHrine 0.3 mg/0.3 mL IJ SOAJ injection USE AS DIRECTED AND THEN CALL 911     No current facility-administered medications for this visit.     Allergies as of 06/18/2017 - Review Complete 06/18/2017  Allergen Reaction Noted  . Shellfish allergy Anaphylaxis 10/03/2016    . Morphine and related Itching 08/23/2016  . Mushroom extract complex Other (See Comments) 10/03/2016  . Tapazole [methimazole] Swelling and Rash 10/03/2016    Vitals: BP 113/74 (BP Location: Right Arm, Patient Position: Sitting)   Pulse 82   Ht 5\' 5"  (1.651 m)   Wt 274 lb (124.3 kg)   BMI 45.60 kg/m  Last Weight:  Wt Readings from Last 1 Encounters:  06/18/17 274 lb (124.3 kg)   Last Height:   Ht Readings from Last 1 Encounters:  06/18/17 5\' 5"  (1.651 m)   Physical exam: Exam: Gen: NAD, conversant, well nourised, obese, well groomed                     CV: RRR, no MRG. No Carotid Bruits. No peripheral edema, warm, nontender Eyes: Conjunctivae clear without exudates or hemorrhage  Neuro: Detailed Neurologic Exam  Speech:    Scanning speech Cognition:    The patient is oriented to person, place, and time;  recent and remote memory impaired ;     language fluent;     Impaired attention, concentration, fund of knowledge Cranial Nerves:    The pupils are equal, round, and reactive to light. The fundi are normal and spontaneous venous pulsations are present. Visual fields are full to finger confrontation. Extraocular movements are intact. Saccades and end gaze nystagmus. Trigeminal sensation is intact and the muscles of mastication are normal. The face is symmetric. The palate elevates in the midline. Hearing intact. Voice is normal. Shoulder shrug is normal. The tongue has normal motion without fasciculations.   Coordination:    Mild dysmetria FTN   Gait:     Wide based Lumbering gait Can walk on heels and toes with imbalance Cant tandem   Motor Observation:    No asymmetry, no atrophy. Minimal postural tremor Tone:    Normal muscle tone.    Posture:    Posture is normal. normal erect    Strength:    Strength is V/V in the upper and lower limbs.      Sensation: intact to LT     Reflex Exam:  DTR's:    Deep tendon reflexes in the upper and lower  extremities are symmetrical bilaterally.   Toes:    The toes are downgoing bilaterally.   Clonus:    Clonus is absent.     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   - They would like a companion animal. If she provides documentation I am happy to fill it out. - She is stable, f/u one year - Mother says she is anxious, patient declines. Treatment and says she is not anxious. Offered counseling and therapy, declined. Follow up with pcp.   Naomie Dean, MD  [facility] [address] [place], [place] [address]  Phone [phone] Fax [phone]  A total of 15 minutes was spent in with this patient. Over half this time was spent on counseling patient on the anxiety, ataxia, cognitive deficits diagnosis and different therapeutic options available.

## 2017-06-19 NOTE — Patient Instructions (Signed)

## 2017-06-27 ENCOUNTER — Other Ambulatory Visit (INDEPENDENT_AMBULATORY_CARE_PROVIDER_SITE_OTHER): Payer: Medicare Other

## 2017-06-27 DIAGNOSIS — Z8639 Personal history of other endocrine, nutritional and metabolic disease: Secondary | ICD-10-CM | POA: Diagnosis not present

## 2017-06-27 LAB — TSH: TSH: 1.11 u[IU]/mL (ref 0.35–4.50)

## 2017-06-27 LAB — T4, FREE: Free T4: 0.9 ng/dL (ref 0.60–1.60)

## 2017-06-28 ENCOUNTER — Other Ambulatory Visit: Payer: Medicare Other

## 2017-07-03 ENCOUNTER — Ambulatory Visit (INDEPENDENT_AMBULATORY_CARE_PROVIDER_SITE_OTHER): Payer: Medicare Other | Admitting: Endocrinology

## 2017-07-03 ENCOUNTER — Encounter: Payer: Self-pay | Admitting: Endocrinology

## 2017-07-03 VITALS — BP 117/74 | HR 87 | Ht 65.0 in | Wt 267.8 lb

## 2017-07-03 DIAGNOSIS — E049 Nontoxic goiter, unspecified: Secondary | ICD-10-CM

## 2017-07-03 NOTE — Progress Notes (Signed)
Patient ID: Erika Boyer, female   DOB: 04-Sep-1995, 22 y.o.   MRN: [id number]                                                                                                               Reason for Appointment:  follow-up visit    Chief complaint: Follow-up of thyroid   History of Present Illness:   The patient was found to have Hyperthyroidism in 03/2015 At that time she had symptoms of palpitations, shakiness, feeling excessively warm and sweaty, irregular menstrual cycles and fatigue. She also had some rapid weight loss and changes in appetite but apparently her weight was fluctuating   Initial evaluation showed her free T4 to be mildly increased at 1.5, high; however in 12/16 her free T4 was 2.4 and 05/2015 Rate was 3.7 along with a total T3 of 494 Details of her initial treatment and office notes from [place] are not available Patient thinks she was started on methimazole and was given a maximum dose of 30 mg Subsequently her free T4 was below normal in 5/17, normal in 1/18 and again low and 0.8 subsequently in 2/18 before she moved here; not clear what doses she was taking but was finally taking 15 mg methimazole  She was seen by a primary care physician in April and her dose was reduced to 10 mg at that time She was admitted to the hospital in 5/18 and was told to have lupus and started on prednisone 50 mg, methimazole was stopped  RECENT history: She was seen in initial consultation in 7/18 when she was complaining of feeling shaky but no palpitations, unusual fatigue or heat intolerance She had gained weight from prednisone  More recently she has been feeling fairly good with no fatigue, she has gained weight from last year  Her labs now again show that her thyroid levels are consistently normal   Wt Readings from Last 3 Encounters:  07/03/17 267 lb 12.8 oz (121.5 kg)  06/18/17 274 lb (124.3 kg)  05/07/17 269 lb 3.2 oz (122.1 kg)     Thyroid function tests as  follows:     Lab Results  Component Value Date   FREET4 0.90 06/27/2017   FREET4 0.98 12/27/2016   FREET4 0.95 10/19/2016   T3FREE 3.1 12/27/2016   T3FREE 2.1 10/19/2016   T3FREE 1.4 (L) 10/03/2016   TSH 1.11 06/27/2017   TSH 0.88 12/27/2016   TSH 1.769 10/19/2016    Lab Results  Component Value Date   THYROTRECAB 1.70 12/27/2016     Allergies as of 07/03/2017      Reactions   Shellfish Allergy Anaphylaxis   Morphine And Related Itching   Mushroom Extract Complex Other (See Comments)   Mother is allergic, so patient avoids these   Tapazole [methimazole] Swelling, Rash   WELTS (also) Mother suspects this MAY be an allergy, as the onset of symptoms coincided with this being started      Medication List        Accurate as of 07/03/17  3:59 PM. Always use your most recent med list.          EPINEPHrine 0.3 mg/0.3 mL Soaj injection Commonly known as:  EPI-PEN USE AS DIRECTED AND THEN CALL 911   hydroxychloroquine 200 MG tablet Commonly known as:  PLAQUENIL TK 2 TS PO QD WF OR MILK   predniSONE 10 MG tablet Commonly known as:  DELTASONE Take 5 mg by mouth daily with breakfast.   PROAIR HFA 108 (90 Base) MCG/ACT inhaler Generic drug:  albuterol Inhale 2 puffs into the lungs every 4 (four) hours as needed for wheezing or shortness of breath.           Past Medical History:  Diagnosis Date  . Anemia   . Anxiety   . Arthritis    "right knee" (10/11/2016)  . Cerebellar degeneration   . Chronic bronchitis (HCC)   . Daily headache   . Depression   . Graves disease 2012   hx; "was on RX; it disappeared" (10/11/2016)  . History of blood transfusion    "related to the anemia" (10/11/2016)  . Hypertension   . Lupus    "not sure what kind" (10/11/2016)  . Pneumonia 10/03/2016    Past Surgical History:  Procedure Laterality Date  . PERICARDIOCENTESIS N/A 10/04/2016   Procedure: Pericardiocentesis;  Surgeon: Yates Decamp, MD;  Location: [facility] INVASIVE CV LAB;   Service: Cardiovascular;  Laterality: N/A;  . REDUCTION MAMMAPLASTY Bilateral 2012  . TONSILLECTOMY      Family History  Problem Relation Age of Onset  . Thalassemia Mother   . Arthritis Mother   . Breast cancer Mother        Mastectomy in early 6's.    . High blood pressure Father   . Mental illness Father        question bipolar disorder per wife  . Sickle cell trait Maternal Aunt   . Breast cancer Maternal Aunt        4 maternal aunts w/breast cancer  . Breast cancer Maternal Grandmother        Bone  . Multiple sclerosis Cousin     Social History:  reports that  has never smoked. she has never used smokeless tobacco. She reports that she does not drink alcohol or use drugs.  Allergies:  Allergies  Allergen Reactions  . Shellfish Allergy Anaphylaxis  . Morphine And Related Itching  . Mushroom Extract Complex Other (See Comments)    Mother is allergic, so patient avoids these  . Tapazole [Methimazole] Swelling and Rash    WELTS (also) Mother suspects this MAY be an allergy, as the onset of symptoms coincided with this being started     Review of Systems She is complaining of pain in various locations including back and chest Currently not taking any medications, previously was on losartan for hypertension Not complaining of any palpitations today    Examination:   BP 117/74 (BP Location: Left Arm, Patient Position: Sitting, Cuff Size: Large)   Pulse 87   Ht 5\' 5"  (1.651 m)   Wt 267 lb 12.8 oz (121.5 kg)   BMI 44.56 kg/m     She has generalized obesity .     The thyroid is enlarged about 1.5-12  times normal, smooth, soft to smooth and diffuse.   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 Any transcriptional errors that result from this process are unintentional.

## 2017-08-06 ENCOUNTER — Ambulatory Visit (INDEPENDENT_AMBULATORY_CARE_PROVIDER_SITE_OTHER): Payer: Medicare Other | Admitting: Physician Assistant

## 2017-08-06 ENCOUNTER — Encounter (INDEPENDENT_AMBULATORY_CARE_PROVIDER_SITE_OTHER): Payer: Self-pay | Admitting: Physician Assistant

## 2017-08-06 ENCOUNTER — Other Ambulatory Visit: Payer: Self-pay

## 2017-08-06 VITALS — BP 104/71 | HR 90 | Temp 98.2°F | Wt 275.2 lb

## 2017-08-06 DIAGNOSIS — D649 Anemia, unspecified: Secondary | ICD-10-CM

## 2017-08-06 NOTE — Progress Notes (Signed)
   Subjective:  Patient ID: Erika Boyer, female    DOB: Sep 15, 1995  Age: 22 y.o. MRN: [id number]  CC: f/u anemia  HPI Erika Iziokhaiis a 22 y.o.femalewith a PMH of anxiety, depression, anemia, Graves Disease, HTN, Lupus, and Cerebellar degeneration presentsto f/u on anemia. Last Hgb 11.6 g/dL three months ago. Vitamin B12 level was normal at 275 pg/mL. Pt feels well. No fatigue, menorrhagia, BRBPR, palpitations, chest pain, SOB, pallor, f/c/n/v, weakness, or insomnia.      Outpatient Medications Prior to Visit  Medication Sig Dispense Refill  . albuterol (PROAIR HFA) 108 (90 Base) MCG/ACT inhaler Inhale 2 puffs into the lungs every 4 (four) hours as needed for wheezing or shortness of breath.    . EPINEPHrine 0.3 mg/0.3 mL IJ SOAJ injection USE AS DIRECTED AND THEN CALL 911    . hydroxychloroquine (PLAQUENIL) 200 MG tablet TK 2 TS PO QD WF OR MILK  2  . predniSONE (DELTASONE) 10 MG tablet Take 5 mg by mouth daily with breakfast.     No facility-administered medications prior to visit.      ROS Review of Systems  Constitutional: Negative for chills, fever and malaise/fatigue.  Eyes: Negative for blurred vision.  Respiratory: Negative for shortness of breath.   Cardiovascular: Negative for chest pain and palpitations.  Gastrointestinal: Negative for abdominal pain and nausea.  Genitourinary: Negative for dysuria and hematuria.  Musculoskeletal: Negative for joint pain and myalgias.  Skin: Negative for rash.  Neurological: Negative for tingling and headaches.  Psychiatric/Behavioral: Negative for depression. The patient is not nervous/anxious.     Objective:  BP 104/71 (BP Location: Left Arm, Patient Position: Sitting, Cuff Size: Large)   Pulse 90   Temp 98.2 F (36.8 C) (Oral)   Wt 275 lb 3.2 oz (124.8 kg)   LMP 07/30/2017 (Approximate)   SpO2 99%   BMI 45.80 kg/m   BP/Weight 08/06/2017 07/03/2017 06/18/2017  Systolic BP 104 117 113  Diastolic BP 71 74 74  Wt.  (Lbs) 275.2 267.8 274  BMI 45.8 44.56 45.6      Physical Exam  Constitutional: She is oriented to person, place, and time.  Well developed, obese, NAD, polite  HENT:  Head: Normocephalic and atraumatic.  Eyes: Conjunctivae are normal. No scleral icterus.  Neck: Normal range of motion. Neck supple. No thyromegaly present.  Cardiovascular: Normal rate, regular rhythm and normal heart sounds.  No carotid bruits.  Pulmonary/Chest: Effort normal and breath sounds normal.  Musculoskeletal: She exhibits no edema.  Neurological: She is alert and oriented to person, place, and time.  Skin: Skin is warm and dry. No rash noted. No erythema. No pallor.  Psychiatric: She has a normal Boyer and affect. Her behavior is normal. Thought content normal.  Vitals reviewed.    Assessment & Plan:    1. Anemia, unspecified type - CBC with Differential     Follow-up: Return in about 6 months (around 02/06/2018).   Loletta Specteroger David Rejeana Fadness PA

## 2017-08-06 NOTE — Patient Instructions (Signed)
Anemia Anemia is a condition in which you do not have enough red blood cells or hemoglobin. Hemoglobin is a substance in red blood cells that carries oxygen. When you do not have enough red blood cells or hemoglobin (are anemic), your body cannot get enough oxygen and your organs may not work properly. As a result, you may feel very tired or have other problems. What are the causes? Common causes of anemia include:  Excessive bleeding. Anemia can be caused by excessive bleeding inside or outside the body, including bleeding from the intestine or from periods in women.  Poor nutrition.  Long-lasting (chronic) kidney, thyroid, and liver disease.  Bone marrow disorders.  Cancer and treatments for cancer.  HIV (human immunodeficiency virus) and AIDS (acquired immunodeficiency syndrome).  Treatments for HIV and AIDS.  Spleen problems.  Blood disorders.  Infections, medicines, and autoimmune disorders that destroy red blood cells.  What are the signs or symptoms? Symptoms of this condition include:  Minor weakness.  Dizziness.  Headache.  Feeling heartbeats that are irregular or faster than normal (palpitations).  Shortness of breath, especially with exercise.  Paleness.  Cold sensitivity.  Indigestion.  Nausea.  Difficulty sleeping.  Difficulty concentrating.  Symptoms may occur suddenly or develop slowly. If your anemia is mild, you may not have symptoms. How is this diagnosed? This condition is diagnosed based on:  Blood tests.  Your medical history.  A physical exam.  Bone marrow biopsy.  Your health care provider may also check your stool (feces) for blood and may do additional testing to look for the cause of your bleeding. You may also have other tests, including:  Imaging tests, such as a CT scan or MRI.  Endoscopy.  Colonoscopy.  How is this treated? Treatment for this condition depends on the cause. If you continue to lose a lot of blood,  you may need to be treated at a hospital. Treatment may include:  Taking supplements of iron, vitamin B12, or folic acid.  Taking a hormone medicine (erythropoietin) that can help to stimulate red blood cell growth.  Having a blood transfusion. This may be needed if you lose a lot of blood.  Making changes to your diet.  Having surgery to remove your spleen.  Follow these instructions at home:  Take over-the-counter and prescription medicines only as told by your health care provider.  Take supplements only as told by your health care provider.  Follow any diet instructions that you were given.  Keep all follow-up visits as told by your health care provider. This is important. Contact a health care provider if:  You develop new bleeding anywhere in the body. Get help right away if:  You are very weak.  You are short of breath.  You have pain in your abdomen or chest.  You are dizzy or feel faint.  You have trouble concentrating.  You have bloody or black, tarry stools.  You vomit repeatedly or you vomit up blood. Summary  Anemia is a condition in which you do not have enough red blood cells or enough of a substance in your red blood cells that carries oxygen (hemoglobin).  Symptoms may occur suddenly or develop slowly.  If your anemia is mild, you may not have symptoms.  This condition is diagnosed with blood tests as well as a medical history and physical exam. Other tests may be needed.  Treatment for this condition depends on the cause of the anemia. This information is not intended to replace advice   given to you by your health care provider. Make sure you discuss any questions you have with your health care provider. Document Released: 06/14/2004 Document Revised: 06/08/2016 Document Reviewed: 06/08/2016 Elsevier Interactive Patient Education  [employer].

## 2017-08-07 ENCOUNTER — Other Ambulatory Visit (INDEPENDENT_AMBULATORY_CARE_PROVIDER_SITE_OTHER): Payer: Self-pay | Admitting: Physician Assistant

## 2017-08-07 ENCOUNTER — Telehealth (INDEPENDENT_AMBULATORY_CARE_PROVIDER_SITE_OTHER): Payer: Self-pay

## 2017-08-07 DIAGNOSIS — I313 Pericardial effusion (noninflammatory): Secondary | ICD-10-CM | POA: Diagnosis not present

## 2017-08-07 DIAGNOSIS — Z6841 Body Mass Index (BMI) 40.0 and over, adult: Secondary | ICD-10-CM | POA: Diagnosis not present

## 2017-08-07 DIAGNOSIS — G319 Degenerative disease of nervous system, unspecified: Secondary | ICD-10-CM | POA: Diagnosis not present

## 2017-08-07 DIAGNOSIS — E2740 Unspecified adrenocortical insufficiency: Secondary | ICD-10-CM | POA: Diagnosis not present

## 2017-08-07 DIAGNOSIS — J90 Pleural effusion, not elsewhere classified: Secondary | ICD-10-CM | POA: Diagnosis not present

## 2017-08-07 DIAGNOSIS — M329 Systemic lupus erythematosus, unspecified: Secondary | ICD-10-CM | POA: Diagnosis not present

## 2017-08-07 LAB — CBC WITH DIFFERENTIAL/PLATELET
Basophils Absolute: 0 10*3/uL (ref 0.0–0.2)
Basos: 0 %
EOS (ABSOLUTE): 0 10*3/uL (ref 0.0–0.4)
EOS: 1 %
HEMATOCRIT: 33.3 % — AB (ref 34.0–46.6)
HEMOGLOBIN: 10.3 g/dL — AB (ref 11.1–15.9)
Immature Grans (Abs): 0 10*3/uL (ref 0.0–0.1)
Immature Granulocytes: 0 %
LYMPHS ABS: 1.1 10*3/uL (ref 0.7–3.1)
Lymphs: 28 %
MCH: 23.8 pg — ABNORMAL LOW (ref 26.6–33.0)
MCHC: 30.9 g/dL — ABNORMAL LOW (ref 31.5–35.7)
MCV: 77 fL — ABNORMAL LOW (ref 79–97)
MONOCYTES: 10 %
Monocytes Absolute: 0.4 10*3/uL (ref 0.1–0.9)
Neutrophils Absolute: 2.5 10*3/uL (ref 1.4–7.0)
Neutrophils: 61 %
Platelets: 293 10*3/uL (ref 150–379)
RBC: 4.33 x10E6/uL (ref 3.77–5.28)
RDW: 15.6 % — ABNORMAL HIGH (ref 12.3–15.4)
WBC: 4 10*3/uL (ref 3.4–10.8)

## 2017-08-07 MED ORDER — FERROUS SULFATE 325 (65 FE) MG PO TABS: 325.0000 mg | ORAL_TABLET | Freq: Every day | ORAL | 5 refills | Status: DC

## 2017-08-07 NOTE — Telephone Encounter (Signed)
Patient aware of mild anemia present, and to pick up iron pills sent to [facility]. Erika Boyer, CMA

## 2017-08-07 NOTE — Telephone Encounter (Signed)
-----   Message from Loletta Specteroger David Gomez, PA-C sent at 08/07/2017  9:12 AM EDT ----- Mild anemia present. I have sent iron pill rx to [facility].

## 2017-09-23 DIAGNOSIS — I10 Essential (primary) hypertension: Secondary | ICD-10-CM

## 2017-10-31 ENCOUNTER — Encounter (INDEPENDENT_AMBULATORY_CARE_PROVIDER_SITE_OTHER): Payer: Self-pay | Admitting: Physician Assistant

## 2017-10-31 ENCOUNTER — Ambulatory Visit (INDEPENDENT_AMBULATORY_CARE_PROVIDER_SITE_OTHER): Payer: Medicare Other | Admitting: Physician Assistant

## 2017-10-31 ENCOUNTER — Other Ambulatory Visit: Payer: Self-pay

## 2017-10-31 VITALS — BP 102/71 | HR 83 | Temp 98.3°F | Ht 65.0 in | Wt 279.2 lb

## 2017-10-31 DIAGNOSIS — Z01810 Encounter for preprocedural cardiovascular examination: Secondary | ICD-10-CM

## 2017-10-31 DIAGNOSIS — G319 Degenerative disease of nervous system, unspecified: Secondary | ICD-10-CM | POA: Diagnosis not present

## 2017-10-31 NOTE — Progress Notes (Signed)
Subjective:  Patient ID: Erika Boyer, female    DOB: [birth year]  Age: 22 y.o. MRN: [id number]  CC: surgery clearance  HPI Erika Boyer a 22 y.o.femalewith a PMH of anxiety, depression, anemia, Graves Disease, HTN, Lupus, and Cerebellar degeneration presents for clearance for oral surgery and bariatric surgery. Oral surgeon's office sent a medical clearance form in which they documented the patient as saying she is "having fluid retention around heart and lungs". I filled out paper to NOT proceed with surgery due to patient's comments. She is now here for work up. Pt says she does not have chest pain, palpitations, SOB, swelling, presyncope, syncope, HA, or tingling/numbness. She clarifies that she told the surgeon she "had" fluid retention around heart and lungs in the past.     Patient also brings a form to be filled out for clearance for bariatric surgery. Mother states that patient went to weight loss clinic at [facility] in [place] and their records can prove patient went through medically approved program for weight loss. Pt also asks for a motorized scooter because she "gets out breath when walking for too long with my friends at the mall". Mother also requests a prescription for a shower bench due to patient's cerebellar atrophy.      Outpatient Medications Prior to Visit  Medication Sig Dispense Refill  . albuterol (PROAIR HFA) 108 (90 Base) MCG/ACT inhaler Inhale 2 puffs into the lungs every 4 (four) hours as needed for wheezing or shortness of breath.    . EPINEPHrine 0.3 mg/0.3 mL IJ SOAJ injection USE AS DIRECTED AND THEN CALL 911    . ferrous sulfate 325 (65 FE) MG tablet Take 1 tablet (325 mg total) by mouth daily with breakfast. 30 tablet 5  . hydroxychloroquine (PLAQUENIL) 200 MG tablet TK 2 TS PO QD WF OR MILK  2   No facility-administered medications prior to visit.      ROS Review of Systems  Constitutional: Negative for chills, fever and  malaise/fatigue.  Eyes: Negative for blurred vision.  Respiratory: Negative for shortness of breath.   Cardiovascular: Negative for chest pain and palpitations.  Gastrointestinal: Negative for abdominal pain and nausea.  Genitourinary: Negative for dysuria and hematuria.  Musculoskeletal: Negative for joint pain and myalgias.  Skin: Negative for rash.  Neurological: Negative for tingling and headaches.       Disequilibrium  Psychiatric/Behavioral: Negative for depression. The patient is not nervous/anxious.     Objective:  BP 102/71 (BP Location: Left Arm, Patient Position: Sitting, Cuff Size: Large)   Pulse 83   Temp 98.3 F (36.8 C) (Oral)   Ht 5\' 5"  (1.651 m)   Wt 279 lb 3.2 oz (126.6 kg)   LMP 10/19/2017 (Exact Date)   SpO2 100%   BMI 46.46 kg/m   BP/Weight 10/31/2017 08/06/2017 07/03/2017  Systolic BP 102 104 117  Diastolic BP 71 71 74  Wt. (Lbs) 279.2 275.2 267.8  BMI 46.46 45.8 44.56      Physical Exam  Constitutional: She is oriented to person, place, and time.  Well developed, obese, NAD, polite  HENT:  Head: Normocephalic and atraumatic.  Eyes: No scleral icterus.  Neck: Normal range of motion. Neck supple. No thyromegaly present.  Cardiovascular: Normal rate, regular rhythm, normal heart sounds and intact distal pulses. Exam reveals no gallop and no friction rub.  No murmur heard. Pulmonary/Chest: Effort normal and breath sounds normal. No stridor. No respiratory distress. She has no wheezes. She has no rales. She  exhibits no tenderness.  Musculoskeletal: She exhibits no edema.  Neurological: She is alert and oriented to person, place, and time.  Unsteady gait/disequilibrium at baseline.  Skin: Skin is warm and dry. No rash noted. No erythema. No pallor.  Psychiatric: She has a normal mood and affect. Her behavior is normal. Thought content normal.  Vitals reviewed.    Assessment & Plan:   1. Pre-operative cardiovascular examination - Pt interested in  having oral surgery and bariatric surgery. - Will order medical records from [facility] to help properly fill out bariatric surgery clearance form.  - EKG 12-Lead - DG Chest 2 View; Future - C-reactive protein - Sedimentation Rate - CBC with Differential    Follow-up: PRN  Loletta Specter PA

## 2017-11-04 ENCOUNTER — Other Ambulatory Visit (INDEPENDENT_AMBULATORY_CARE_PROVIDER_SITE_OTHER): Payer: Medicare Other

## 2017-11-04 ENCOUNTER — Other Ambulatory Visit (INDEPENDENT_AMBULATORY_CARE_PROVIDER_SITE_OTHER): Payer: Self-pay | Admitting: Physician Assistant

## 2017-11-04 DIAGNOSIS — Z01810 Encounter for preprocedural cardiovascular examination: Secondary | ICD-10-CM | POA: Diagnosis not present

## 2017-11-04 NOTE — Addendum Note (Signed)
Addended by: Maryjean MornOBERTS, TEMPESTT S on: 11/04/2017 05:41 PM   Modules accepted: Orders

## 2017-11-05 ENCOUNTER — Other Ambulatory Visit (INDEPENDENT_AMBULATORY_CARE_PROVIDER_SITE_OTHER): Payer: Self-pay | Admitting: Physician Assistant

## 2017-11-05 ENCOUNTER — Encounter (INDEPENDENT_AMBULATORY_CARE_PROVIDER_SITE_OTHER): Payer: Self-pay | Admitting: Physician Assistant

## 2017-11-05 DIAGNOSIS — Z8679 Personal history of other diseases of the circulatory system: Secondary | ICD-10-CM

## 2017-11-05 DIAGNOSIS — M329 Systemic lupus erythematosus, unspecified: Secondary | ICD-10-CM

## 2017-11-05 LAB — CBC WITH DIFFERENTIAL/PLATELET
BASOS: 0 %
Basophils Absolute: 0 10*3/uL (ref 0.0–0.2)
EOS (ABSOLUTE): 0.1 10*3/uL (ref 0.0–0.4)
EOS: 1 %
HEMATOCRIT: 36.1 % (ref 34.0–46.6)
HEMOGLOBIN: 11.1 g/dL (ref 11.1–15.9)
IMMATURE GRANS (ABS): 0 10*3/uL (ref 0.0–0.1)
Immature Granulocytes: 0 %
LYMPHS ABS: 0.9 10*3/uL (ref 0.7–3.1)
Lymphs: 21 %
MCH: 24.2 pg — AB (ref 26.6–33.0)
MCHC: 30.7 g/dL — ABNORMAL LOW (ref 31.5–35.7)
MCV: 79 fL (ref 79–97)
MONOCYTES: 7 %
Monocytes Absolute: 0.3 10*3/uL (ref 0.1–0.9)
NEUTROS ABS: 3.1 10*3/uL (ref 1.4–7.0)
Neutrophils: 71 %
Platelets: 279 10*3/uL (ref 150–450)
RBC: 4.59 x10E6/uL (ref 3.77–5.28)
RDW: 15.7 % — ABNORMAL HIGH (ref 12.3–15.4)
WBC: 4.5 10*3/uL (ref 3.4–10.8)

## 2017-11-05 LAB — SEDIMENTATION RATE: Sed Rate: 56 mm/hr — ABNORMAL HIGH (ref 0–32)

## 2017-11-05 LAB — C-REACTIVE PROTEIN: CRP: 23 mg/L — ABNORMAL HIGH (ref 0–10)

## 2017-11-08 ENCOUNTER — Telehealth (INDEPENDENT_AMBULATORY_CARE_PROVIDER_SITE_OTHER): Payer: Self-pay

## 2017-11-08 NOTE — Telephone Encounter (Signed)
-----   Message from Loletta Specteroger David Gomez, PA-C sent at 11/05/2017 11:58 AM EDT ----- Inflammatory markers elevated. May be due to lupus but can not exclude pathology of the heart. Furthermore, lupus can cause pericarditis. Therefore, I will have to refer to cardiology to exclude any "fluid around the heart".

## 2017-11-08 NOTE — Telephone Encounter (Signed)
Patient is aware of results. And referral being made to cardiology. Patient aware that cardiology office will contact her directly to schedule appointment. Maryjean Mornempestt S Roberts, CMA

## 2017-11-12 DIAGNOSIS — M329 Systemic lupus erythematosus, unspecified: Secondary | ICD-10-CM | POA: Diagnosis not present

## 2017-11-12 DIAGNOSIS — I313 Pericardial effusion (noninflammatory): Secondary | ICD-10-CM | POA: Diagnosis not present

## 2017-11-12 DIAGNOSIS — G319 Degenerative disease of nervous system, unspecified: Secondary | ICD-10-CM | POA: Diagnosis not present

## 2017-11-12 DIAGNOSIS — Z6841 Body Mass Index (BMI) 40.0 and over, adult: Secondary | ICD-10-CM | POA: Diagnosis not present

## 2017-11-12 DIAGNOSIS — E2740 Unspecified adrenocortical insufficiency: Secondary | ICD-10-CM | POA: Diagnosis not present

## 2017-11-12 DIAGNOSIS — J90 Pleural effusion, not elsewhere classified: Secondary | ICD-10-CM | POA: Diagnosis not present

## 2017-11-12 DIAGNOSIS — L509 Urticaria, unspecified: Secondary | ICD-10-CM | POA: Diagnosis not present

## 2017-11-18 ENCOUNTER — Telehealth (INDEPENDENT_AMBULATORY_CARE_PROVIDER_SITE_OTHER): Payer: Self-pay | Admitting: Physician Assistant

## 2017-11-18 NOTE — Telephone Encounter (Signed)
That is a physical medicine and rehabilitation doctor. She will need to see a doctor whom is strictly a cardiologist.

## 2017-11-18 NOTE — Telephone Encounter (Signed)
Patient mother called wanting to know if PCP could referral her daughter Erika Boyer to Dr.Swartz, Earna CoderZachary T for her cardiology specialist since he has already dealt with her and patient is familiar with him.   Please Advise [phone]  Thank you

## 2017-11-25 NOTE — Progress Notes (Signed)
Cardiology Office Note:    Date:  11/25/2017   ID:  Baxter Flattery, DOB 1995/05/26, MRN [id number]  PCP:  Loletta Specter, PA-C  Cardiologist:  Jodelle Red, MD PhD  Referring MD: Loletta Specter, PA-C   Chief Complaint  Patient presents with  . Follow-up    pt denied chest pain    History of Present Illness:    Erika Boyer is a 22 y.o. female with a hx of graves disease, lupus, prior pericardial effusion with tamponade requiring pericardiocentesis, and morbid obesity is seen as a new consult at the request of Loletta Specter for evaluation of preoperative cardiac risk given her lupus.  Planning for surgery to remove 4 teeth, waiting to have evaluation with cardiology prior to scheduling. We discussed the cardiac risk is predominantly related to her low blood pressure, and she should have monitoring by anesthesia if the plan is for a major procedure.  In terms of her symptoms, both the patient and her mother report she is doing much better since her pericardiocentesis for tamponade. The patient does sometimes get dizzy when she lies flat, but this is rare and much better than 6 mos ago. No chest pain. No swelling in hands or feet. Weight has been going up, she's not sure why. Thinks she has gainedabout 10 lbs in 6mos. Mom feels strongly that her weight is due to poor nutrition choices and minimal physical activity. We discussed this at length.  Erika Boyer did try to work at job at [employer] recently, but she had difficulty being on her feet all day long. She otherwise denies issues with everyday activities. She states that she is taking her lupus medication, though mom questions.  Cousin died at age 32 from heart attacks, had history of sickle cell. Recently had another cousin pass away at a young age (27) from some time of inflammatory disease and obesity.   Past Medical History:  Diagnosis Date  . Anemia   . Anxiety   . Arthritis    "right knee"  (10/11/2016)  . Cerebellar degeneration   . Chronic bronchitis (HCC)   . Daily headache   . Depression   . Graves disease 2012   hx; "was on RX; it disappeared" (10/11/2016)  . History of blood transfusion    "related to the anemia" (10/11/2016)  . Hypertension   . Lupus (HCC)    "not sure what kind" (10/11/2016)  . Pneumonia 10/03/2016    Past Surgical History:  Procedure Laterality Date  . PERICARDIOCENTESIS N/A 10/04/2016   Procedure: Pericardiocentesis;  Surgeon: Yates Decamp, MD;  Location: [facility] INVASIVE CV LAB;  Service: Cardiovascular;  Laterality: N/A;  . REDUCTION MAMMAPLASTY Bilateral 2012  . TONSILLECTOMY      Current Medications: Current Meds  Medication Sig  . albuterol (PROAIR HFA) 108 (90 Base) MCG/ACT inhaler Inhale 2 puffs into the lungs every 4 (four) hours as needed for wheezing or shortness of breath.  . hydroxychloroquine (PLAQUENIL) 200 MG tablet TK 2 TS PO QD WF OR MILK     Allergies:   Shellfish allergy; Morphine and related; Mushroom extract complex; and Tapazole [methimazole]   Social History   Socioeconomic History  . Marital status: Single    Spouse name: Not on file  . Number of children: 0  . Years of education: 36  . Highest education level: Some college, no degree  Occupational History  . Not on file  Social Needs  . Financial resource strain: Not on file  .  Food insecurity:    Worry: Not on file    Inability: Not on file  . Transportation needs:    Medical: Not on file    Non-medical: Not on file  Tobacco Use  . Smoking status: Never Smoker  . Smokeless tobacco: Never Used  Substance and Sexual Activity  . Alcohol use: No  . Drug use: No  . Sexual activity: Yes  Lifestyle  . Physical activity:    Days per week: Not on file    Minutes per session: Not on file  . Stress: Not on file  Relationships  . Social connections:    Talks on phone: Not on file    Gets together: Not on file    Attends religious service: Not on file     Active member of club or organization: Not on file    Attends meetings of clubs or organizations: Not on file    Relationship status: Not on file  Other Topics Concern  . Not on file  Social History Narrative   Lives at home w/ her mom   Right-handed   Caffeine: occasional coffee     Family History: The patient's family history includes Arthritis in her mother; Breast cancer in her maternal aunt, maternal grandmother, and mother; High blood pressure in her father; Mental illness in her father; Multiple sclerosis in her cousin; Sickle cell trait in her maternal aunt; Thalassemia in her mother. Cousin died at age 38 from heart attacks, had history of sickle cell. Recently had another cousin pass away at a young age (27) from some time of inflammatory disease and obesity.  ROS:   Please see the history of present illness. Review of Systems - General ROS: positive for  - weight gain negative for - chills, fatigue or fever Psychological ROS: noted to be tearful when talking about the burden of chronic disease at the age of 37. Declined further assistance or referral for her stress. Ophthalmic ROS: negative for - blurry vision or decreased vision ENT ROS: negative for - hearing change or tinnitus Allergy and Immunology ROS: negative for - seasonal allergies Hematological and Lymphatic ROS: negative for - bleeding problems, blood clots or bruising Endocrine ROS: negative for - temperature intolerance Respiratory ROS: no cough, shortness of breath, or wheezing Cardiovascular ROS: no chest pain or dyspnea on exertion negative for - edema, irregular heartbeat, loss of consciousness, orthopnea, paroxysmal nocturnal dyspnea or shortness of breath Gastrointestinal ROS: no abdominal pain, change in bowel habits, or black or bloody stools Genito-Urinary ROS: no dysuria, trouble voiding, or hematuria  EKGs/Labs/Other Studies Reviewed:    The following studies were reviewed today: Echo  10-04-16 Study Conclusions - Left ventricle: The cavity size is small and underfilled.   Systolic function was normal. The estimated ejection fraction was   in the range of 60% to 65%. Wall motion was normal; there were no   regional wall motion abnormalities. Left ventricular diastolic   function parameters were normal. - Aortic valve: Trileaflet; normal thickness leaflets. There was no   regurgitation. - Right ventricle: The cavity size was normal. Wall thickness was   normal. Systolic function was normal. - Tricuspid valve: There was mild regurgitation. - Pulmonary arteries: Systolic pressure was mildly increased. PA   peak pressure: 33 mm Hg (S). - Pericardium, extracardiac: There was a large left pleural   effusion.  Impressions - There is severe circumferential pericardial effusion with up to 3   cm around the lateral LV wall and up to  4 cm around the right   atrium.   There is right atrial invagination and imcomplete RV filling   during diastole.   No significant mitral or tricuspid inflow changes with   respiration however it might be affected by ventilations.     These findings are suspicious for an early tamponade. A   pericardiocentensis is recommended.  Followed by pericardiocentesis same day with removal of serosanguinous fluid.  EKG:  EKG is ordered today.  The ekg ordered today demonstrates normal sinus rhythm  Recent Labs: 01/08/2017: BUN 7; Creatinine, Ser 0.70; Potassium 3.9; Sodium 139 06/27/2017: TSH 1.11 11/04/2017: Hemoglobin 11.1; Platelets 279  Recent Lipid Panel    Component Value Date/Time   CHOL 82 10/04/2016 1430   TRIG 139 10/03/2016 1928    Physical Exam:    VS:  BP 102/77   Pulse 86   Ht 5\' 5"  (1.651 m)   Wt 281 lb 3.2 oz (127.6 kg)   BMI 46.79 kg/m    Initial blood pressure in the 80s systolic, rechecked, patient asymptomatic   Wt Readings from Last 3 Encounters:  11/26/17 281 lb 3.2 oz (127.6 kg)  10/31/17 279 lb 3.2 oz  (126.6 kg)  08/06/17 275 lb 3.2 oz (124.8 kg)    GEN: Well nourished, well developed in no acute distress HEENT: Normal NECK: No JVD, no HJR; No carotid bruits LYMPHATICS: No lymphadenopathy CARDIAC: regular rhythm, normal S1 and S2, no murmurs, rubs, gallops. Unable to palpate PMI due to body habitus. RESPIRATORY:  Clear to auscultation without rales, wheezing or rhonchi  ABDOMEN: Soft, non-tender, non-distended MUSCULOSKELETAL:  No edema; No deformity  SKIN: Warm and dry NEUROLOGIC:  Alert and oriented x 3 PSYCHIATRIC:  Normal affect   ASSESSMENT:    1. Cardiomegaly   2. Other forms of systemic lupus erythematosus, unspecified organ involvement status (HCC)   3. Morbid obesity (HCC)   4. Class 3 severe obesity due to excess calories without serious comorbidity with body mass index (BMI) of 45.0 to 49.9 in adult (HCC)   5. Preop cardiovascular exam   6. Counseling on health promotion and disease prevention    PLAN:    1. Preoperative evaluation: Stable from a cardiovascular standpoint. Able to perfom >4 METs of activity without issue. No indications for further cardiac testing. No clinical evidence of tamponade. Main cardiovascular risk with anesthesia would be hypotension given her low baseline blood pressures. I recommend that if she is to have a surgical procedure, she should be monitored throughout the procedure with frequent blood pressure monitoring. Anesthesia should be prepared to treat transient hypotension. If this can be provided then her cardiac risk is optimized.  2. Prior pericardial effusion with tamponade s/p pericardiocentesis: she is ordered for a repeat chest x-ray according to the EMR. Of note, this may not accurately distinguish between cardiomegaly and pericardial effusion. From a clinical standpoint, she does not have evidence of tamponade. No indications to repeat echo given no change in her clinical status (has improved significantly post  pericardiocentesis).  3. Morbid obesity: we discussed this at length. Mom is very involved and concerned about Gaynor's weight, diet, and activity level. We all discussed that Erika Boyer has to be the one to drive any changes, as her motivation is key to long term success. She is precontemplative in all of this. Did discuss guidelines. Offered support and assistance to her if she wishes to make changes.  4. Primary prevention: discussed the risks of inflammatory diseases such as lupus in  the long term. Framed in the background of lifestyle changes.   Will follow up in 1 year or sooner PRN.  Medication Adjustments/Labs and Tests Ordered: Current medicines are reviewed at length with the patient today.  Concerns regarding medicines are outlined above.  Orders Placed This Encounter  Procedures  . EKG 12-Lead   No orders of the defined types were placed in this encounter.   Patient Instructions  Medication Instructions: Your physician recommends that you continue on your current medications as directed.    If you need a refill on your cardiac medications before your next appointment, please call your pharmacy.   Labwork: None ordered  Procedures/Testing: None ordered  Follow-Up: Your physician wants you to follow-up in 1 year with Dr. Cristal Deerhristopher. You will receive a reminder letter in the mail two months in advance. If you don't receive a letter, please call our office at [phone] to schedule this follow-up appointment.   Special Instructions:    Thank you for choosing [facility] at [facility]!!       Signed, Jodelle RedBridgette Edrie Ehrich, MD PhD 11/26/2017 11:54 AM    [facility]

## 2017-11-26 ENCOUNTER — Encounter: Payer: Self-pay | Admitting: Cardiology

## 2017-11-26 ENCOUNTER — Ambulatory Visit (INDEPENDENT_AMBULATORY_CARE_PROVIDER_SITE_OTHER): Payer: Medicare Other | Admitting: Cardiology

## 2017-11-26 VITALS — BP 102/77 | HR 86 | Ht 65.0 in | Wt 281.2 lb

## 2017-11-26 DIAGNOSIS — I517 Cardiomegaly: Secondary | ICD-10-CM | POA: Diagnosis not present

## 2017-11-26 DIAGNOSIS — Z6841 Body Mass Index (BMI) 40.0 and over, adult: Secondary | ICD-10-CM

## 2017-11-26 DIAGNOSIS — M328 Other forms of systemic lupus erythematosus: Secondary | ICD-10-CM | POA: Diagnosis not present

## 2017-11-26 DIAGNOSIS — Z01810 Encounter for preprocedural cardiovascular examination: Secondary | ICD-10-CM

## 2017-11-26 DIAGNOSIS — Z7189 Other specified counseling: Secondary | ICD-10-CM | POA: Diagnosis not present

## 2017-11-26 NOTE — Patient Instructions (Signed)
Medication Instructions: Your physician recommends that you continue on your current medications as directed.    If you need a refill on your cardiac medications before your next appointment, please call your pharmacy.   Labwork: None ordered  Procedures/Testing: None ordered  Follow-Up: Your physician wants you to follow-up in 1 year with Dr. Cristal Deerhristopher. You will receive a reminder letter in the mail two months in advance. If you don't receive a letter, please call our office at [phone] to schedule this follow-up appointment.   Special Instructions:    Thank you for choosing [facility] at [facility]!!

## 2017-12-27 ENCOUNTER — Other Ambulatory Visit: Payer: Self-pay

## 2017-12-27 ENCOUNTER — Telehealth (INDEPENDENT_AMBULATORY_CARE_PROVIDER_SITE_OTHER): Payer: Self-pay | Admitting: Physician Assistant

## 2017-12-27 ENCOUNTER — Encounter (INDEPENDENT_AMBULATORY_CARE_PROVIDER_SITE_OTHER): Payer: Self-pay | Admitting: Physician Assistant

## 2017-12-27 ENCOUNTER — Ambulatory Visit (INDEPENDENT_AMBULATORY_CARE_PROVIDER_SITE_OTHER): Payer: Medicare Other | Admitting: Physician Assistant

## 2017-12-27 VITALS — BP 122/85 | HR 88 | Temp 98.4°F | Ht 65.0 in | Wt 281.6 lb

## 2017-12-27 DIAGNOSIS — G319 Degenerative disease of nervous system, unspecified: Secondary | ICD-10-CM | POA: Diagnosis not present

## 2017-12-27 DIAGNOSIS — Z029 Encounter for administrative examinations, unspecified: Secondary | ICD-10-CM

## 2017-12-27 DIAGNOSIS — Z01818 Encounter for other preprocedural examination: Secondary | ICD-10-CM

## 2017-12-27 MED ORDER — WALL GRAB BAR MISC: 1.0000 | Freq: Every day | 0 refills | Status: DC

## 2017-12-27 NOTE — Telephone Encounter (Signed)
Opened in error

## 2017-12-27 NOTE — Progress Notes (Signed)
Subjective:  Patient ID: Erika Boyer, female    DOB: 01-Jun-1995  Age: 22 y.o. MRN: [id number]  CC: Dental Surgery Clearance  HPI  Erika Boyer a 22 y.o.femalewith a PMH of anxiety, depression, anemia, Graves Disease, HTN, Lupus, and Cerebellar degeneration presents for clearance for oral surgery. She is supposed to have extraction of four teeth on 01/29/18. States she feels generally well except for tooth pain/pressure. Pt has also been cleared by cardiologist for her procedure. Does not endorse CP, palpitations, SOB, HA, tingling, numbness, abdominal pain, f/c/n/v, rash, swelling, or GI/GU sxs.     Outpatient Medications Prior to Visit  Medication Sig Dispense Refill  . albuterol (PROAIR HFA) 108 (90 Base) MCG/ACT inhaler Inhale 2 puffs into the lungs every 4 (four) hours as needed for wheezing or shortness of breath.    . hydroxychloroquine (PLAQUENIL) 200 MG tablet TK 2 TS PO QD WF OR MILK  2  . EPINEPHrine 0.3 mg/0.3 mL IJ SOAJ injection USE AS DIRECTED AND THEN CALL 911     No facility-administered medications prior to visit.      ROS Review of Systems  Constitutional: Negative for chills, fever and malaise/fatigue.  Eyes: Negative for blurred vision.  Respiratory: Negative for shortness of breath.   Cardiovascular: Negative for chest pain and palpitations.  Gastrointestinal: Negative for abdominal pain and nausea.  Genitourinary: Negative for dysuria and hematuria.  Musculoskeletal: Negative for joint pain and myalgias.  Skin: Negative for rash.  Neurological: Negative for tingling and headaches.       Imbalance  Psychiatric/Behavioral: Negative for depression. The patient is not nervous/anxious.     Objective:  BP 122/85 (BP Location: Left Arm, Patient Position: Sitting, Cuff Size: Large)   Pulse 88   Temp 98.4 F (36.9 C) (Oral)   Ht 5' 5"  (1.651 m)   Wt 281 lb 9.6 oz (127.7 kg)   LMP 12/09/2017   SpO2 99%   BMI 46.86 kg/m   BP/Weight 12/27/2017  11/26/2017 11/04/735  Systolic BP 106 269 485  Diastolic BP 85 77 71  Wt. (Lbs) 281.6 281.2 279.2  BMI 46.86 46.79 46.46      Physical Exam  Constitutional: She is oriented to person, place, and time.  Well developed, obese, NAD, polite  HENT:  Head: Normocephalic and atraumatic.  Eyes: No scleral icterus.  Neck: Normal range of motion. Neck supple. No thyromegaly present.  Cardiovascular: Normal rate, regular rhythm and normal heart sounds.  Pulmonary/Chest: Effort normal and breath sounds normal.  Abdominal: Soft. Bowel sounds are normal. There is no tenderness.  Musculoskeletal: She exhibits no edema.  Neurological: She is alert and oriented to person, place, and time.  Incoordination and gait imbalance  Skin: Skin is warm and dry. No rash noted. No erythema. No pallor.  Psychiatric: She has a normal mood and affect. Her behavior is normal. Thought content normal.  Vitals reviewed.    Assessment & Plan:   1. Pre-operative clearance - Form from Dentist's office stated specifically patient has to have physical done within 30 days of her surgery date. We obtained authorization from [facility] at the Dentist's office to do physical today. Physical done today puts her at 32 days out.  - Pt medically cleared for tooth extraction. Cardiology also cleared patient for tooth extraction.  2. Administrative encounter - Form filled during encounter  3. Cerebellar atrophy - Patient's mother requested grab bars for bathroom after pt was discharge. - Misc. Devices (WALL GRAB BAR) MISC; 1 kit by  Does not apply route daily.  Dispense: 1 each; Refill: 0   Meds ordered this encounter  Medications  . DISCONTD: Misc. Devices (WALL GRAB BAR) MISC    Sig: 1 kit by Does not apply route daily.    Dispense:  1 each    Refill:  0    Order Specific Question:   Supervising Provider    Answer:   Charlott Rakes [[id number]]  . Misc. Devices (WALL GRAB BAR) MISC    Sig: 1 kit by Does not apply route  daily.    Dispense:  1 each    Refill:  0    Order Specific Question:   Supervising Provider    Answer:   Charlott Rakes [[id number]]    Follow-up: PRN   [name] PA

## 2017-12-31 ENCOUNTER — Ambulatory Visit: Payer: Medicare Other | Admitting: Cardiology

## 2017-12-31 ENCOUNTER — Encounter (HOSPITAL_COMMUNITY): Payer: Self-pay | Admitting: Emergency Medicine

## 2017-12-31 ENCOUNTER — Ambulatory Visit (HOSPITAL_COMMUNITY)
Admission: EM | Admit: 2017-12-31 | Discharge: 2017-12-31 | Disposition: A | Discharge status: Home / Self Care | Payer: Medicare Other | Attending: Family Medicine | Admitting: Family Medicine

## 2017-12-31 DIAGNOSIS — L732 Hidradenitis suppurativa: Secondary | ICD-10-CM

## 2017-12-31 DIAGNOSIS — L03313 Cellulitis of chest wall: Secondary | ICD-10-CM

## 2017-12-31 MED ORDER — DOXYCYCLINE HYCLATE 100 MG PO CAPS: 100.0000 mg | ORAL_CAPSULE | Freq: Two times a day (BID) | ORAL | 0 refills | Status: DC

## 2017-12-31 NOTE — Discharge Instructions (Addendum)
Keep the area clean and dry.  Things to look out for: increasing pain not relieved by ibuprofen/acetaminophen, fevers, spreading redness, drainage of pus, or foul odor.

## 2017-12-31 NOTE — ED Provider Notes (Signed)
  [facility]    CSN: [id number] Arrival date & time: 12/31/17  1045  Chief Complaint  Patient presents with  . Abscess    Erika Boyer is a 22 y.o. female here for a skin complaint.  Duration: several months Location: b/l axillae Pruritic? No Painful? Yes Drainage? Yes New soaps/lotions/topicals/detergents? No Sick contacts? No Other associated symptoms: Recurrent abscesses Therapies tried thus far: None, was recently on abx for oral infection  ROS:  Const: No fevers Skin: As noted in HPI  Past Medical History:  Diagnosis Date  . Anemia   . Anxiety   . Arthritis    "right knee" (10/11/2016)  . Cerebellar degeneration   . Chronic bronchitis (HCC)   . Daily headache   . Depression   . Graves disease 2012   hx; "was on RX; it disappeared" (10/11/2016)  . History of blood transfusion    "related to the anemia" (10/11/2016)  . Hypertension   . Lupus (HCC)    "not sure what kind" (10/11/2016)  . Pneumonia 10/03/2016   Allergies  Allergen Reactions  . Shellfish Allergy Anaphylaxis  . Morphine And Related Itching  . Mushroom Extract Complex Other (See Comments)    Mother is allergic, so patient avoids these  . Tapazole [Methimazole] Swelling and Rash    WELTS (also) Mother suspects this MAY be an allergy, as the onset of symptoms coincided with this being started     Pulse 95   Temp 98.5 F (36.9 C) (Oral)   Resp 18   LMP 12/10/2017   SpO2 98%  Gen: awake, alert, appearing stated age Lungs: No accessory muscle use Skin: There is a fluctuant and actively draining lesion under L axillae with surrounding erythema that is very warm and ttp; there are indurated areas under both axilla without as much tenderness and no drainage.  Psych: Age appropriate judgment and insight   Final Clinical Impressions(s) / UC Diagnoses   Final diagnoses:  Hidradenitis suppurativa  Cellulitis of chest wall     Discharge Instructions     Keep the area clean and  dry.  Things to look out for: increasing pain not relieved by ibuprofen/acetaminophen, fevers, spreading redness, drainage of pus, or foul odor.      ED Prescriptions    Medication Sig Dispense Auth. Provider   doxycycline (VIBRAMYCIN) 100 MG capsule Take 1 capsule (100 mg total) by mouth 2 (two) times daily. 20 capsule Sharlene DoryWendling, Amillia Biffle Paul, DO     Doxy for cellulitis, will likely need topical abx for maintenance. Warm compresses encouraged. I do not think her actively draining lesion needs to be I&D'd. Warning signs and symptoms verbalized and written down in AVS.  F/u with pcp prn. Pt voiced understanding and agreement to the plan.    Sharlene DoryWendling, Rillie Riffel Paul, DO 12/31/17 1126

## 2017-12-31 NOTE — ED Triage Notes (Signed)
PT has multiple cysts under both arms. This has been going on for about 2 months

## 2018-01-12 IMAGING — US US RENAL
1 series · 14 of 25 positions shown · non-contrast
Comparison: None.

CLINICAL DATA: Renal failure

EXAM:
RENAL / URINARY TRACT ULTRASOUND COMPLETE

[Series 1: us renal · 0.25mm/px · 14 of 26 slices shown]
[im 1/26]
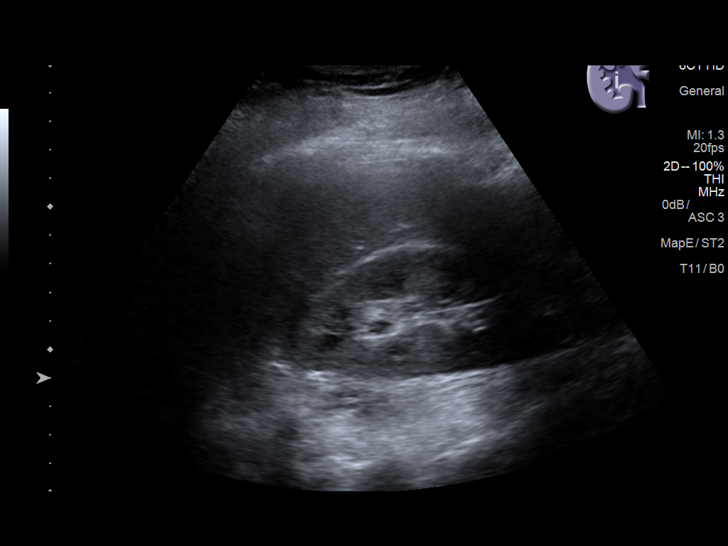
[im 3/26]
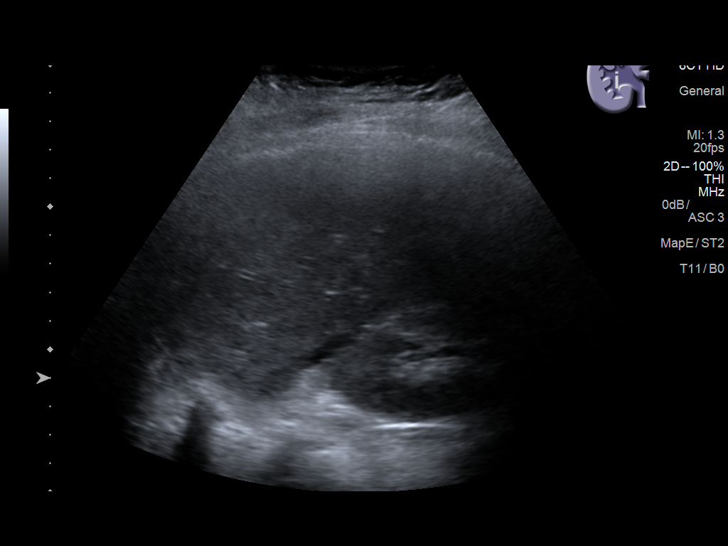
[im 5/26]
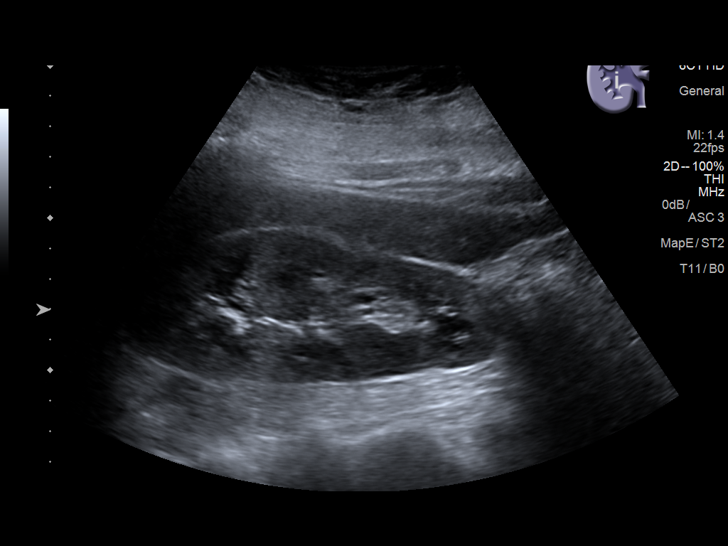
[im 7/26]
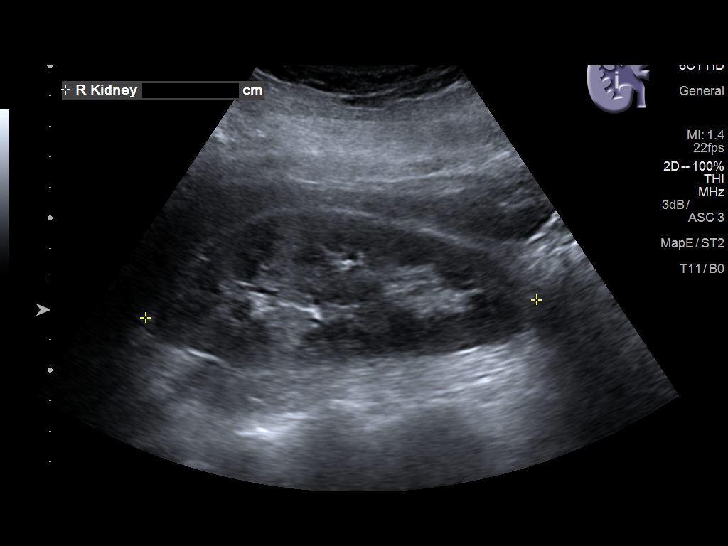
[im 9/26]
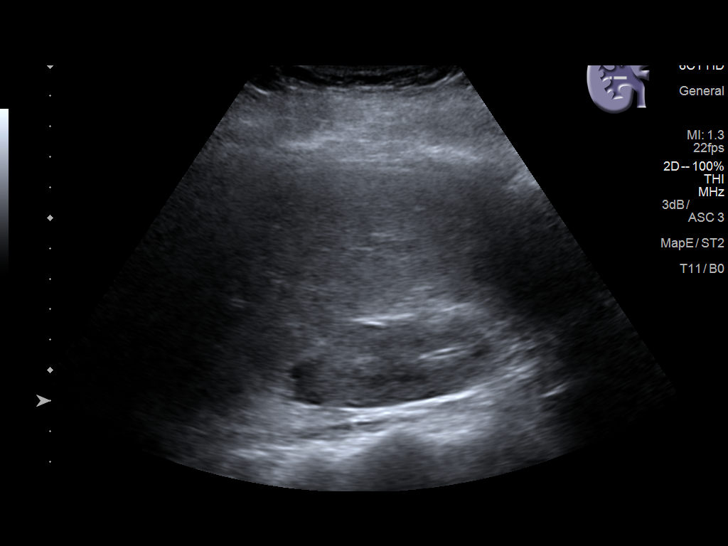
[im 10/26]
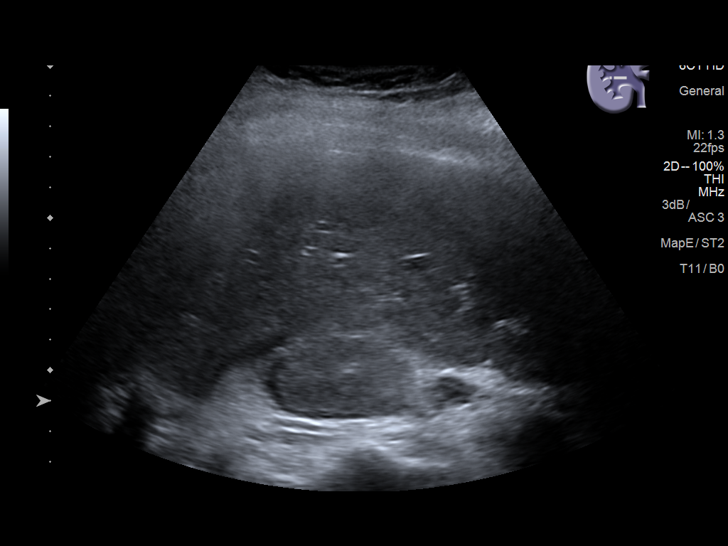
[im 12/26]
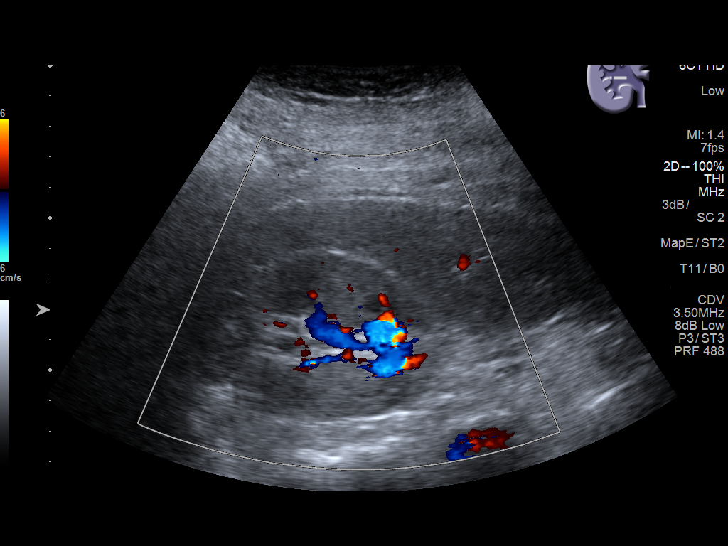
[im 14/26]
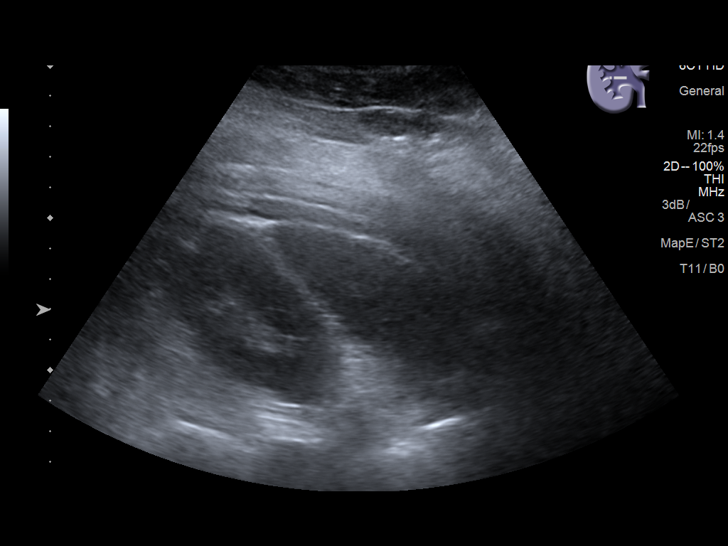
[im 16/26]
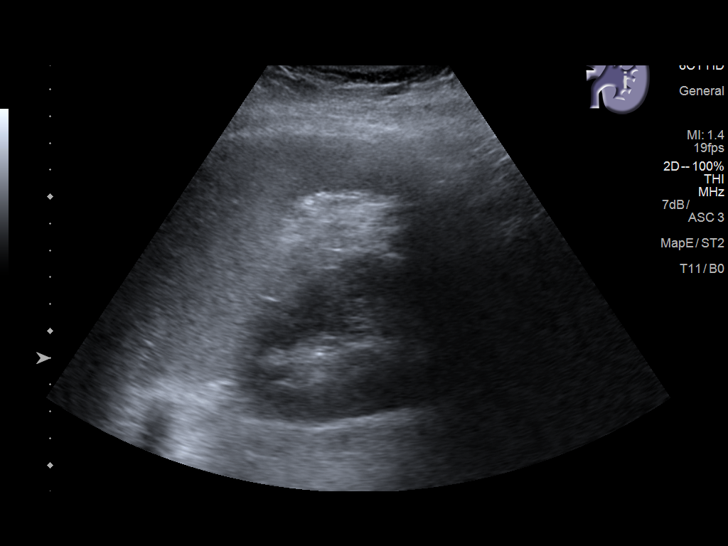
[im 17/26]
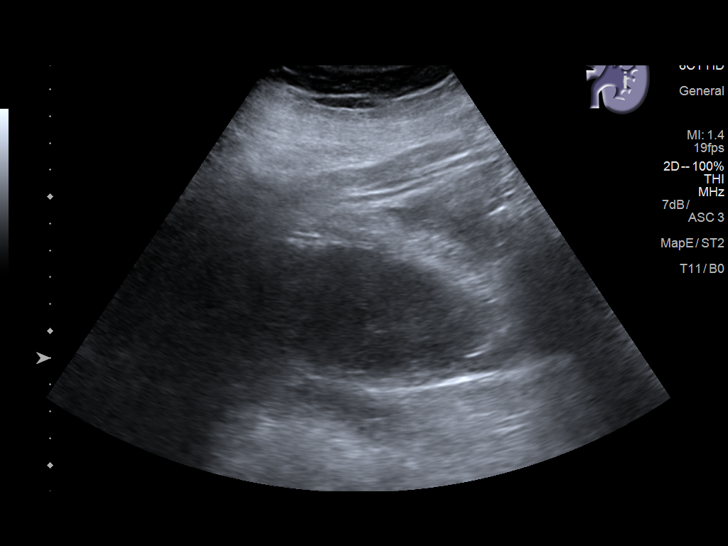
[im 19/26]
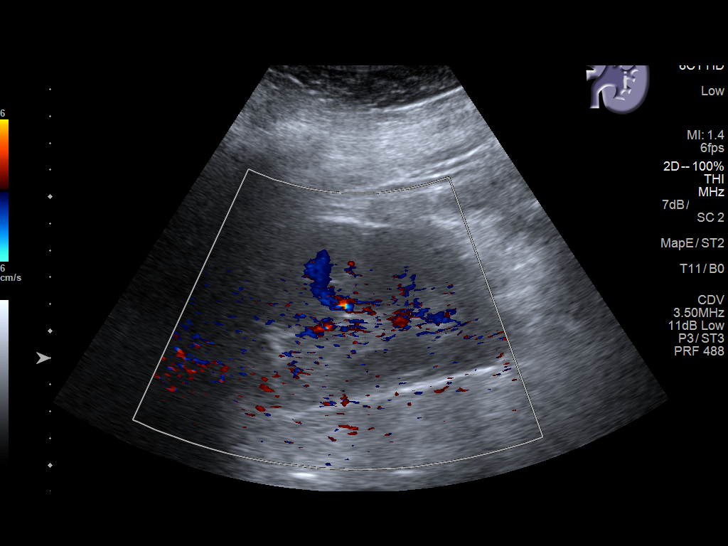
[im 21/26]
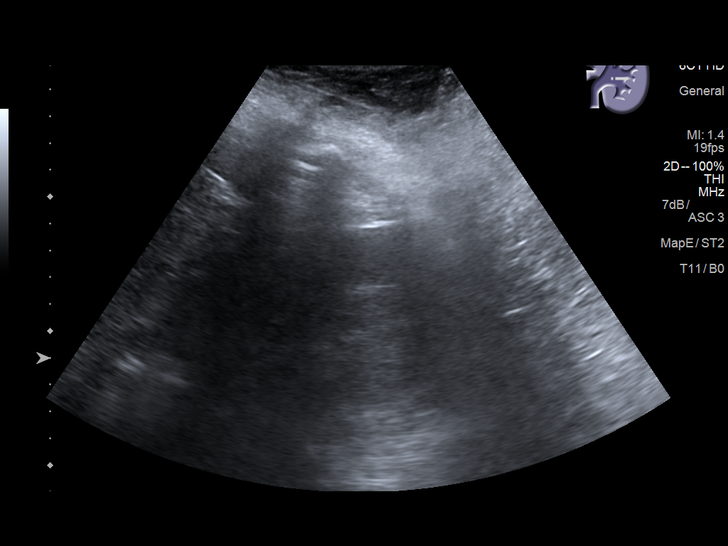
[im 23/26]
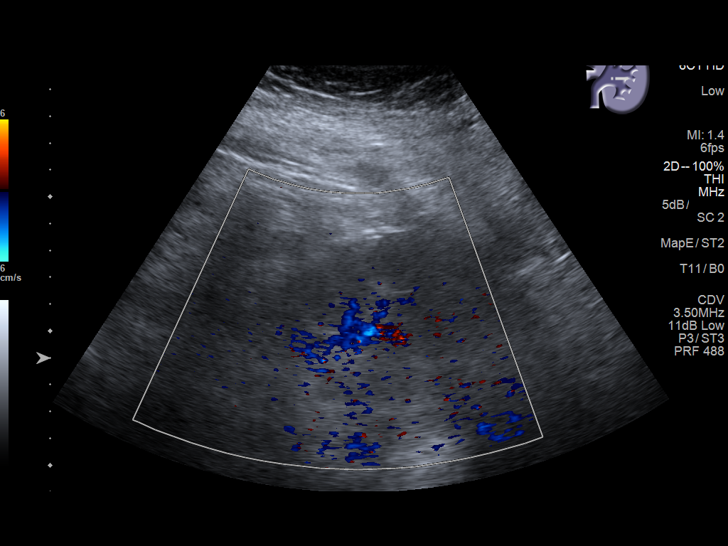
[im 26/26]
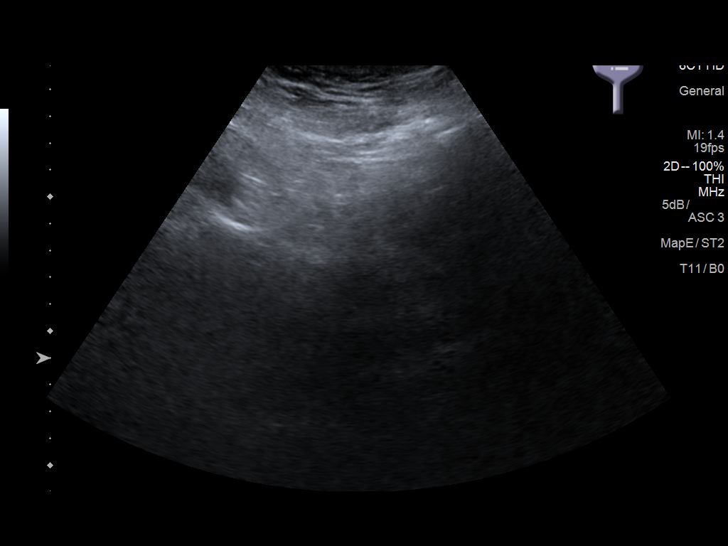

[14 of 25 positions shown; findings below may reference images not displayed]

FINDINGS: Right Kidney:

Length: 12.9 cm. Echogenicity within normal limits. No mass or
hydronephrosis visualized.

Left Kidney:

Length: 12.2 cm. Echogenicity within normal limits. No mass or
hydronephrosis visualized.

Bladder:

Not well-visualized.

Other: Small right pleural effusion.  Small volume ascites.
IMPRESSION: Normal renal ultrasound.  No obstructive uropathy.

## 2018-02-05 ENCOUNTER — Ambulatory Visit (INDEPENDENT_AMBULATORY_CARE_PROVIDER_SITE_OTHER): Payer: Medicare Other | Admitting: Physician Assistant

## 2018-02-13 ENCOUNTER — Encounter (HOSPITAL_COMMUNITY): Payer: Self-pay

## 2018-02-13 ENCOUNTER — Ambulatory Visit (HOSPITAL_COMMUNITY)
Admission: EM | Admit: 2018-02-13 | Discharge: 2018-02-13 | Disposition: A | Discharge status: Home / Self Care | Payer: Medicare Other | Attending: Family Medicine | Admitting: Family Medicine

## 2018-02-13 DIAGNOSIS — L02412 Cutaneous abscess of left axilla: Secondary | ICD-10-CM

## 2018-02-13 MED ORDER — DOXYCYCLINE HYCLATE 100 MG PO CAPS: 100.0000 mg | ORAL_CAPSULE | Freq: Two times a day (BID) | ORAL | 0 refills | Status: AC

## 2018-02-13 NOTE — ED Triage Notes (Signed)
Pt presents with abscess x 2 to left underarm x 2 days. Reports some drainage and a lot of pain.

## 2018-02-13 NOTE — ED Provider Notes (Signed)
[place]    CSN: [id number] Arrival date & time: 02/13/18  1329     History   Chief Complaint Chief Complaint  Patient presents with  . Abscess    HPI Erika Boyer is a 22 y.o. female history of lupus, cognitive disorder presenting today for evaluation of an abscess.  Patient states that she has had an abscess to her left axilla.  Symptoms have been progressing over the past 5 days.  It is started to drain today.  Is caused her a lot of discomfort and pain.  Denies fevers.  She has had 2 prior issues with abscesses to her axilla on the left side.  Is never seen surgery for this.  HPI  Past Medical History:  Diagnosis Date  . Anemia   . Anxiety   . Arthritis    "right knee" (10/11/2016)  . Cerebellar degeneration   . Chronic bronchitis ([place])   . Daily headache   . Depression   . Graves disease 2012   hx; "was on RX; it disappeared" (10/11/2016)  . History of blood transfusion    "related to the anemia" (10/11/2016)  . Hypertension   . Lupus ([place])    "not sure what kind" (10/11/2016)  . Pneumonia 10/03/2016    Patient Active Problem List   Diagnosis Date Noted  . Anxiety 06/18/2017  . Cervical high risk HPV (human papillomavirus) test positive 03/20/2017  . 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 Medication refill 11/19/2016  . SLE (systemic lupus erythematosus) ([place]) 10/30/2016  . Cognitive disorder   . Debility 10/12/2016  . Cerebral atrophy   . MR (mental retardation)   . Drug-induced lupus erythematosus   . Tachycardia   . Hypokalemia   . Acute blood loss anemia   . Hyperthyroidism 10/03/2016  . Morbid obesity with BMI of 40.0-44.9, adult ([place]) 10/03/2016  . 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 Microcytic anemia 10/03/2016  . Cardiomegaly 10/03/2016  . Pleural effusion   . Pleural effusion, left   . Graves disease 08/23/2016    Past Surgical History:  Procedure Laterality Date  . PERICARDIOCENTESIS N/A 10/04/2016   Procedure: Pericardiocentesis;   Surgeon: Adrian Prows, MD;  Location: [facility];  Service: Cardiovascular;  Laterality: N/A;  . REDUCTION MAMMAPLASTY Bilateral 2012  . TONSILLECTOMY      OB History   None      Home Medications    Prior to Admission medications   Medication Sig Start Date End Date Taking? Authorizing Provider  albuterol (PROAIR HFA) 108 (90 Base) MCG/ACT inhaler Inhale 2 puffs into the lungs every 4 (four) hours as needed for wheezing or shortness of breath.    [provider]  doxycycline (VIBRAMYCIN) 100 MG capsule Take 1 capsule (100 mg total) by mouth 2 (two) times daily for 10 days. 02/13/18 02/23/18  Shanitra Phillippi C, PA-C  EPINEPHrine 0.3 mg/0.3 mL IJ SOAJ injection USE AS DIRECTED AND THEN CALL 911 09/08/15   [provider]  hydroxychloroquine (PLAQUENIL) 200 MG tablet TK 2 TS PO QD WF OR MILK 01/23/17   [provider]  Misc. Devices (WALL GRAB BAR) MISC 1 kit by Does not apply route daily. 12/27/17   [name], PA-C    Family History Family History  Problem Relation Age of Onset  . Thalassemia Mother   . Arthritis Mother   . Breast cancer Mother        Mastectomy in early 67's.    . High blood pressure Father   .  Mental illness Father        question bipolar disorder per wife  . Sickle cell trait Maternal Aunt   . Breast cancer Maternal Aunt        4 maternal aunts w/breast cancer  . Breast cancer Maternal Grandmother        Bone  . Multiple sclerosis Cousin     Social History Social History   Tobacco Use  . Smoking status: Never Smoker  . Smokeless tobacco: Never Used  Substance Use Topics  . Alcohol use: No  . Drug use: No     Allergies   Shellfish allergy; Morphine and related; Mushroom extract complex; and Tapazole [methimazole]   Review of Systems Review of Systems  Constitutional: Negative for fatigue and fever.  Eyes: Negative for visual disturbance.  Respiratory: Negative for shortness of breath.   Cardiovascular:  Negative for chest pain.  Gastrointestinal: Negative for abdominal pain, nausea and vomiting.  Musculoskeletal: Negative for arthralgias and joint swelling.  Skin: Positive for color change and rash. Negative for wound.       Abscess  Neurological: Negative for dizziness, weakness, light-headedness and headaches.     Physical Exam Triage Vital Signs ED Triage Vitals  Enc Vitals Group     BP --      Pulse Rate 02/13/18 1355 86     Resp 02/13/18 1355 19     Temp 02/13/18 1355 98.3 F (36.8 C)     Temp src --      SpO2 02/13/18 1355 95 %     Weight --      Height --      Head Circumference --      Peak Flow --      Pain Score 02/13/18 1353 4     Pain Loc --      Pain Edu? --      Excl. in [place]? --    No data found.  Updated Vital Signs Pulse 86   Temp 98.3 F (36.8 C)   Resp 19   LMP 02/03/2018   SpO2 95%   Visual Acuity Right Eye Distance:   Left Eye Distance:   Bilateral Distance:    Right Eye Near:   Left Eye Near:    Bilateral Near:     Physical Exam  Constitutional: She is oriented to person, place, and time. She appears well-developed and well-nourished.  No acute distress  HENT:  Head: Normocephalic and atraumatic.  Nose: Nose normal.  Eyes: Conjunctivae are normal.  Neck: Neck supple.  Cardiovascular: Normal rate.  Pulmonary/Chest: Effort normal. No respiratory distress.  Abdominal: She exhibits no distension.  Musculoskeletal: Normal range of motion.  Neurological: She is alert and oriented to person, place, and time.  Skin: Skin is warm and dry.  Large indurated with fluctuance abscess to left axilla, actively draining purulent discharge  Psychiatric: She has a normal mood and affect.  Nursing note and vitals reviewed.    UC Treatments / Results  Labs (all labs ordered are listed, but only abnormal results are displayed) Labs Reviewed - No data to display  EKG None  Radiology No results found.  Procedures Incision and  Drainage Date/Time: 02/13/2018 2:39 PM Performed by: Deven Audi, Elesa Hacker, PA-C Authorized by: Robyn Haber, MD   Consent:    Consent obtained:  Verbal   Consent given by:  Patient and parent   Risks discussed:  Incomplete drainage, pain, damage to other organs and bleeding   Alternatives discussed:  No  treatment Location:    Type:  Abscess   Size:  4 cm   Location:  Upper extremity   Upper extremity location:  Shoulder   Shoulder location:  L shoulder Pre-procedure details:    Skin preparation:  Betadine Anesthesia (see MAR for exact dosages):    Anesthesia method:  Local infiltration   Local anesthetic:  Lidocaine 2% WITH epi Procedure type:    Complexity:  Simple Procedure details:    Needle aspiration: no     Incision types:  Stab incision   Incision depth:  Subcutaneous   Scalpel blade:  11   Wound management:  Probed and deloculated   Drainage:  Bloody and purulent   Drainage amount:  Copious   Packing materials:  1/2 in iodoform gauze Post-procedure details:    Patient tolerance of procedure:  Tolerated well, no immediate complications   (including critical care time)  Medications Ordered in UC Medications - No data to display  Initial Impression / Assessment and Plan / UC Course  I have reviewed the triage vital signs and the nursing notes.  Pertinent labs & imaging results that were available during my care of the patient were reviewed by me and considered in my medical decision making (see chart for details).     Abscess actively draining, further drainage expressed, given amount of drainage, abscess opened further in order to place packing to allow for continued drainage.  Remove packing in 24 to 48 hours.  Begin doxycycline.  Warm compresses/soaks.  Consult for surgery provided given recurrent nature of this.Discussed strict return precautions. Patient verbalized understanding and is agreeable with plan.  Final Clinical Impressions(s) / UC Diagnoses    Final diagnoses:  Abscess of left axilla     Discharge Instructions     Please begin doxycycline for 10 days  Return here in 2 days or remove packing in 24-48 hours or you may remove at home if you are comfortable with this  Apply warm compresses/hot rags to area with massage to express further drainage especially the first 24-48 hours  Return if symptoms returning or not improving   ED Prescriptions    Medication Sig Dispense Auth. Provider   doxycycline (VIBRAMYCIN) 100 MG capsule Take 1 capsule (100 mg total) by mouth 2 (two) times daily for 10 days. 20 capsule Rolf Fells C, PA-C     Controlled Substance Prescriptions [place] Controlled Substance Registry consulted? Not Applicable   Janith Lima, [place] 02/13/18 1441

## 2018-02-13 NOTE — Discharge Instructions (Addendum)
Please begin doxycycline for 10 days  Return here in 2 days or remove packing in 24-48 hours or you may remove at home if you are comfortable with this  Apply warm compresses/hot rags to area with massage to express further drainage especially the first 24-48 hours  Return if symptoms returning or not improving

## 2018-02-15 ENCOUNTER — Ambulatory Visit (HOSPITAL_COMMUNITY)
Admission: EM | Admit: 2018-02-15 | Discharge: 2018-02-15 | Disposition: A | Discharge status: Home / Self Care | Payer: Medicare Other | Attending: Family Medicine | Admitting: Family Medicine

## 2018-02-15 ENCOUNTER — Other Ambulatory Visit: Payer: Self-pay

## 2018-02-15 DIAGNOSIS — Z5189 Encounter for other specified aftercare: Secondary | ICD-10-CM

## 2018-02-15 DIAGNOSIS — L02412 Cutaneous abscess of left axilla: Secondary | ICD-10-CM

## 2018-02-15 MED ORDER — HYDROCODONE-ACETAMINOPHEN 5-325 MG PO TABS: ORAL_TABLET | ORAL | 0 refills | Status: DC

## 2018-02-15 NOTE — ED Triage Notes (Signed)
Pt here to have her packing removed . Left armpit

## 2018-02-15 NOTE — ED Provider Notes (Signed)
[place]   [id number] 02/15/18 Arrival Time: 42  CC: Abscess recheck  SUBJECTIVE:  Erika Boyer is a 22 y.o. female who presents follow up abscess recheck and packing removal.  Had incision and drainage performed on left axilla on 02/13/18.  Prescribed antibiotics at that time and instructed to perform warm compresses.  Compliant with antibiotics.  Patient still reports pain and purulent drainange.    ROS: As per HPI.  Past Medical History:  Diagnosis Date  . Anemia   . Anxiety   . Arthritis    "right knee" (10/11/2016)  . Cerebellar degeneration   . Chronic bronchitis ([place])   . Daily headache   . Depression   . Graves disease 2012   hx; "was on RX; it disappeared" (10/11/2016)  . History of blood transfusion    "related to the anemia" (10/11/2016)  . Hypertension   . Lupus ([place])    "not sure what kind" (10/11/2016)  . Pneumonia 10/03/2016   Past Surgical History:  Procedure Laterality Date  . PERICARDIOCENTESIS N/A 10/04/2016   Procedure: Pericardiocentesis;  Surgeon: Adrian Prows, MD;  Location: [facility];  Service: Cardiovascular;  Laterality: N/A;  . REDUCTION MAMMAPLASTY Bilateral 2012  . TONSILLECTOMY     Allergies  Allergen Reactions  . Shellfish Allergy Anaphylaxis  . Morphine And Related Itching  . Mushroom Extract Complex Other (See Comments)    Mother is allergic, so patient avoids these  . Tapazole [Methimazole] Swelling and Rash    WELTS (also) Mother suspects this MAY be an allergy, as the onset of symptoms coincided with this being started   No current facility-administered medications on file prior to encounter.    Current Outpatient Medications on File Prior to Encounter  Medication Sig Dispense Refill  . albuterol (PROAIR HFA) 108 (90 Base) MCG/ACT inhaler Inhale 2 puffs into the lungs every 4 (four) hours as needed for wheezing or shortness of breath.    . doxycycline (VIBRAMYCIN) 100 MG capsule Take 1 capsule (100 mg total)  by mouth 2 (two) times daily for 10 days. 20 capsule 0  . EPINEPHrine 0.3 mg/0.3 mL IJ SOAJ injection USE AS DIRECTED AND THEN CALL 911    . hydroxychloroquine (PLAQUENIL) 200 MG tablet TK 2 TS PO QD WF OR MILK  2  . Misc. Devices (WALL GRAB BAR) MISC 1 kit by Does not apply route daily. 1 each 0   Social History   Socioeconomic History  . Marital status: Single    Spouse name: Not on file  . Number of children: 0  . Years of education: 87  . Highest education level: Some college, no degree  Occupational History  . Not on file  Social Needs  . Financial resource strain: Not on file  . Food insecurity:    Worry: Not on file    Inability: Not on file  . Transportation needs:    Medical: Not on file    Non-medical: Not on file  Tobacco Use  . Smoking status: Never Smoker  . Smokeless tobacco: Never Used  Substance and Sexual Activity  . Alcohol use: No  . Drug use: No  . Sexual activity: Yes  Lifestyle  . Physical activity:    Days per week: Not on file    Minutes per session: Not on file  . Stress: Not on file  Relationships  . Social connections:    Talks on phone: Not on file    Gets together: Not on file  Attends religious service: Not on file    Active member of club or organization: Not on file    Attends meetings of clubs or organizations: Not on file    Relationship status: Not on file  . Intimate partner violence:    Fear of current or ex partner: Not on file    Emotionally abused: Not on file    Physically abused: Not on file    Forced sexual activity: Not on file  Other Topics Concern  . Not on file  Social History Narrative   Lives at home w/ her mom   Right-handed   Caffeine: occasional coffee   Family History  Problem Relation Age of Onset  . Thalassemia Mother   . Arthritis Mother   . Breast cancer Mother        Mastectomy in early 46's.    . High blood pressure Father   . Mental illness Father        question bipolar disorder per wife  .  Sickle cell trait Maternal Aunt   . Breast cancer Maternal Aunt        4 maternal aunts w/breast cancer  . Breast cancer Maternal Grandmother        Bone  . Multiple sclerosis Cousin     OBJECTIVE: Vitals:   02/15/18 1801 02/15/18 1803  BP:  111/65  Pulse:  94  Resp:  18  Temp:  98.8 F (37.1 C)  SpO2:  90%  Weight: 275 lb (124.7 kg)     General appearance: alert; no distress; appears uncomfortable Lungs: clear to auscultation bilaterally Heart: regular rate and rhythm.  Radial pulse 2+ bilaterally Extremities: no edema Skin: warm and dry; incision with packing localized to left axilla, obvious purulent drainage; exquisitely tender to palpation; unable to remove packing at this time due to patient's discomfort Psychological: alert and cooperative; agitated mood and affect  ASSESSMENT & PLAN:  1. Abscess re-check     Meds ordered this encounter  Medications  . HYDROcodone-acetaminophen (NORCO/VICODIN) 5-325 MG tablet    Sig: Take 1 tablet 1 hour prior to appointment tomorrow to have packing removed    Dispense:  2 tablet    Refill:  0    Order Specific Question:   Supervising Provider    Answer:   Wynona Luna [[id number]]   Prescription for vicodin given Unable to remove packing today due to pain Will return tomorrow with mother and take vicodin prior to appointment to have packing removed and wound reevaluated Patient and mother in agreement with this plan Will go to the ER if the patient has any new or worsening symptoms   Reviewed expectations re: course of current medical issues. Questions answered. Outlined signs and symptoms indicating need for more acute intervention. Patient verbalized understanding. After Visit Summary given.   Lestine Box, PA-C 02/15/18 1841

## 2018-02-15 NOTE — Discharge Instructions (Addendum)
Unable to remove packing today due to pain Continue with antibiotic and warm compresses at home.   Prescription for vicodin given Will return tomorrow with mother and take vicodin prior to appointment to have packing removed and wound reevaluated Patient and mother in agreement with this plan Will go to the ER if the patient has any new or worsening symptoms

## 2018-02-16 ENCOUNTER — Encounter (HOSPITAL_COMMUNITY): Payer: Self-pay | Admitting: *Deleted

## 2018-02-16 ENCOUNTER — Ambulatory Visit (HOSPITAL_COMMUNITY)
Admission: EM | Admit: 2018-02-16 | Discharge: 2018-02-16 | Disposition: A | Discharge status: Home / Self Care | Payer: Medicare Other

## 2018-02-16 DIAGNOSIS — Z5189 Encounter for other specified aftercare: Secondary | ICD-10-CM

## 2018-02-16 DIAGNOSIS — L02412 Cutaneous abscess of left axilla: Secondary | ICD-10-CM

## 2018-02-16 DIAGNOSIS — Z4800 Encounter for change or removal of nonsurgical wound dressing: Secondary | ICD-10-CM

## 2018-02-16 NOTE — ED Triage Notes (Signed)
Presents for removal packing left axilla.  Pt states took 1 Vicodin approx 1 hr ago, and a second pill approx 15 min ago.

## 2018-02-16 NOTE — ED Provider Notes (Signed)
[place]    CSN: [id number] Arrival date & time: 02/16/18  1002     History   Chief Complaint Chief Complaint  Patient presents with  . Follow-up    HPI Erika Boyer is a 22 y.o. female.  Accompanied by mother.  HPI   Wound care following s/p I&D. Patient in office today to have wound packing removed from her left axilla Patient came in on yesterday and was very anxious and was unable to have wound evaluated.  She denies any signs of infection such as chills, fever, purulent drainage.  She continues to have some discomfort in the area which the I&D was performed.  She was prescribed on yesterday Vicodin which she took her last dose approximately 30 minutes ago and is currently without pain.  Past Medical History:  Diagnosis Date  . Anemia   . Anxiety   . Arthritis    "right knee" (10/11/2016)  . Cerebellar degeneration   . Chronic bronchitis ([place])   . Daily headache   . Depression   . Graves disease 2012   hx; "was on RX; it disappeared" (10/11/2016)  . History of blood transfusion    "related to the anemia" (10/11/2016)  . Hypertension   . Lupus ([place])    "not sure what kind" (10/11/2016)  . Pneumonia 10/03/2016    Patient Active Problem List   Diagnosis Date Noted  . Anxiety 06/18/2017  . Cervical high risk HPV (human papillomavirus) test positive 03/20/2017  . 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 Medication refill 11/19/2016  . SLE (systemic lupus erythematosus) ([place]) 10/30/2016  . Cognitive disorder   . Debility 10/12/2016  . Cerebral atrophy   . MR (mental retardation)   . Drug-induced lupus erythematosus   . Tachycardia   . Hypokalemia   . Acute blood loss anemia   . Hyperthyroidism 10/03/2016  . Morbid obesity with BMI of 40.0-44.9, adult ([place]) 10/03/2016  . 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 Microcytic anemia 10/03/2016  . Cardiomegaly 10/03/2016  . Pleural effusion   . Pleural effusion, left   . Graves disease 08/23/2016    Past  Surgical History:  Procedure Laterality Date  . PERICARDIOCENTESIS N/A 10/04/2016   Procedure: Pericardiocentesis;  Surgeon: Adrian Prows, MD;  Location: [facility];  Service: Cardiovascular;  Laterality: N/A;  . REDUCTION MAMMAPLASTY Bilateral 2012  . TONSILLECTOMY      OB History   None      Home Medications    Prior to Admission medications   Medication Sig Start Date End Date Taking? Authorizing Provider  albuterol (PROAIR HFA) 108 (90 Base) MCG/ACT inhaler Inhale 2 puffs into the lungs every 4 (four) hours as needed for wheezing or shortness of breath.    [provider]  doxycycline (VIBRAMYCIN) 100 MG capsule Take 1 capsule (100 mg total) by mouth 2 (two) times daily for 10 days. 02/13/18 02/23/18  Wieters, Hallie C, PA-C  EPINEPHrine 0.3 mg/0.3 mL IJ SOAJ injection USE AS DIRECTED AND THEN CALL 911 09/08/15   [provider]  HYDROcodone-acetaminophen (NORCO/VICODIN) 5-325 MG tablet Take 1 tablet 1 hour prior to appointment tomorrow to have packing removed 02/15/18   Wurst, [place], PA-C  hydroxychloroquine (PLAQUENIL) 200 MG tablet TK 2 TS PO QD WF OR MILK 01/23/17   [provider]  Misc. Devices (WALL GRAB BAR) MISC 1 kit by Does not apply route daily. 12/27/17   [name], PA-C    Family History Family History  Problem Relation Age  of Onset  . Thalassemia Mother   . Arthritis Mother   . Breast cancer Mother        Mastectomy in early 59's.    . High blood pressure Father   . Mental illness Father        question bipolar disorder per wife  . Sickle cell trait Maternal Aunt   . Breast cancer Maternal Aunt        4 maternal aunts w/breast cancer  . Breast cancer Maternal Grandmother        Bone  . Multiple sclerosis Cousin     Social History Social History   Tobacco Use  . Smoking status: Never Smoker  . Smokeless tobacco: Never Used  Substance Use Topics  . Alcohol use: No  . Drug use: No     Allergies   Shellfish  allergy; Morphine and related; Mushroom extract complex; and Tapazole [methimazole]   Review of Systems Review of Systems Pertinent negatives listed in HPI Physical Exam Triage Vital Signs ED Triage Vitals  Enc Vitals Group     BP 02/16/18 1020 115/68     Pulse Rate 02/16/18 1020 100     Resp 02/16/18 1020 (!) 22     Temp 02/16/18 1020 98.7 F (37.1 C)     Temp Source 02/16/18 1020 Oral     SpO2 02/16/18 1020 98 %     Weight --      Height --      Head Circumference --      Peak Flow --      Pain Score 02/16/18 1022 1     Pain Loc --      Pain Edu? --      Excl. in [place]? --    No data found.  Updated Vital Signs BP 115/68   Pulse 100   Temp 98.7 F (37.1 C) (Oral)   Resp (!) 22   LMP 02/03/2018   SpO2 98%   Visual Acuity Right Eye Distance:   Left Eye Distance:   Bilateral Distance:    Right Eye Near:   Left Eye Near:    Bilateral Near:     Physical Exam  General appearance: alert, well developed, well nourished, cooperative and in no distress Head: Normocephalic, without obvious abnormality, atraumatic Respiratory: Respirations even and unlabored, normal respiratory rate Extremities: No gross deformities Skin: Left axilla: Wound packing removed.  Axilla wound is free of purulent drainage mild serosanguineous blood on wound packing which was removed. Area negative of the erythema and/or edema. Skin color, texture, turgor normal. No rashes seen  Psych: Appropriate mood and affect. Neurologic: Mental status: Alert, oriented to person, place, and time, thought content appropriate. UC Treatments / Results  Labs (all labs ordered are listed, but only abnormal results are displayed) Labs Reviewed - No data to display  EKG None  Radiology No results found.  Procedures Procedures (including critical care time)  Medications Ordered in UC Medications - No data to display  Initial Impression / Assessment and Plan / UC Course  I have reviewed the triage  vital signs and the nursing notes.  Pertinent labs & imaging results that were available during my care of the patient were reviewed by me and considered in my medical decision making (see chart for details).  Patient presents today for evaluation of left axilla wound status post I&D for abscess. Area is free of signs of infection.  Recommended wound repacking as wound could possibly drain as wound was vigorously  irrigated today.  Patient refused repacking.  Wound was redressed.  Strict return precautions advised if any signs of infection occur.  No additional follow-up warranted.  Patient along with mother verbalized understanding of plan.  Final Clinical Impressions(s) / UC Diagnoses   Final diagnoses:  Visit for wound care   Discharge Instructions   None    ED Prescriptions    None     Controlled Substance Prescriptions [place] Controlled Substance Registry consulted? Not Applicable   Scot Jun, [place] 02/17/18 [phone]

## 2018-02-27 DIAGNOSIS — Z6841 Body Mass Index (BMI) 40.0 and over, adult: Secondary | ICD-10-CM | POA: Diagnosis not present

## 2018-02-27 DIAGNOSIS — I313 Pericardial effusion (noninflammatory): Secondary | ICD-10-CM | POA: Diagnosis not present

## 2018-02-27 DIAGNOSIS — J90 Pleural effusion, not elsewhere classified: Secondary | ICD-10-CM | POA: Diagnosis not present

## 2018-02-27 DIAGNOSIS — Z79899 Other long term (current) drug therapy: Secondary | ICD-10-CM | POA: Diagnosis not present

## 2018-02-27 DIAGNOSIS — G319 Degenerative disease of nervous system, unspecified: Secondary | ICD-10-CM | POA: Diagnosis not present

## 2018-02-27 DIAGNOSIS — M329 Systemic lupus erythematosus, unspecified: Secondary | ICD-10-CM | POA: Diagnosis not present

## 2018-03-25 ENCOUNTER — Encounter (INDEPENDENT_AMBULATORY_CARE_PROVIDER_SITE_OTHER): Payer: Self-pay | Admitting: Physician Assistant

## 2018-03-25 ENCOUNTER — Ambulatory Visit (INDEPENDENT_AMBULATORY_CARE_PROVIDER_SITE_OTHER): Payer: Medicare Other | Admitting: Physician Assistant

## 2018-03-25 ENCOUNTER — Other Ambulatory Visit: Payer: Self-pay

## 2018-03-25 VITALS — BP 107/73 | HR 77 | Temp 97.8°F | Ht 65.0 in | Wt 272.8 lb

## 2018-03-25 DIAGNOSIS — Z118 Encounter for screening for other infectious and parasitic diseases: Secondary | ICD-10-CM | POA: Diagnosis not present

## 2018-03-25 DIAGNOSIS — Z23 Encounter for immunization: Secondary | ICD-10-CM

## 2018-03-25 DIAGNOSIS — Z09 Encounter for follow-up examination after completed treatment for conditions other than malignant neoplasm: Secondary | ICD-10-CM | POA: Diagnosis not present

## 2018-03-25 NOTE — Patient Instructions (Signed)
Td Vaccine (Tetanus and Diphtheria): What You Need to Know 1. Why get vaccinated? Tetanus  and diphtheria are very serious diseases. They are rare in the [place] today, but people who do become infected often have severe complications. Td vaccine is used to protect adolescents and adults from both of these diseases. Both tetanus and diphtheria are infections caused by bacteria. Diphtheria spreads from person to person through coughing or sneezing. Tetanus-causing bacteria enter the body through cuts, scratches, or wounds. TETANUS (lockjaw) causes painful muscle tightening and stiffness, usually all over the body.  It can lead to tightening of muscles in the head and neck so you can't open your mouth, swallow, or sometimes even breathe. Tetanus kills about 1 out of every 10 people who are infected even after receiving the best medical care.  DIPHTHERIA can cause a thick coating to form in the back of the throat.  It can lead to breathing problems, paralysis, heart failure, and death.  Before vaccines, as many as 200,000 cases of diphtheria and hundreds of cases of tetanus were reported in the [place] each year. Since vaccination began, reports of cases for both diseases have dropped by about 99%. 2. Td vaccine Td vaccine can protect adolescents and adults from tetanus and diphtheria. Td is usually given as a booster dose every 10 years but it can also be given earlier after a severe and dirty wound or burn. Another vaccine, called Tdap, which protects against pertussis in addition to tetanus and diphtheria, is sometimes recommended instead of Td vaccine. Your doctor or the person giving you the vaccine can give you more information. Td may safely be given at the same time as other vaccines. 3. Some people should not get this vaccine  A person who has ever had a life-threatening allergic reaction after a previous dose of any tetanus or diphtheria containing vaccine, OR has a severe  allergy to any part of this vaccine, should not get Td vaccine. Tell the person giving the vaccine about any severe allergies.  Talk to your doctor if you: ? had severe pain or swelling after any vaccine containing diphtheria or tetanus, ? ever had a condition called Guillain Barre Syndrome (GBS), ? aren't feeling well on the day the shot is scheduled. 4. What are the risks from Td vaccine? With any medicine, including vaccines, there is a chance of side effects. These are usually mild and go away on their own. Serious reactions are also possible but are rare. Most people who get Td vaccine do not have any problems with it. Mild problems following Td vaccine: (Did not interfere with activities)  Pain where the shot was given (about 8 people in 10)  Redness or swelling where the shot was given (about 1 person in 4)  Mild fever (rare)  Headache (about 1 person in 4)  Tiredness (about 1 person in 4)  Moderate problems following Td vaccine: (Interfered with activities, but did not require medical attention)  Fever over 102F (rare)  Severe problems following Td vaccine: (Unable to perform usual activities; required medical attention)  Swelling, severe pain, bleeding and/or redness in the arm where the shot was given (rare).  Problems that could happen after any vaccine:  People sometimes faint after a medical procedure, including vaccination. Sitting or lying down for about 15 minutes can help prevent fainting, and injuries caused by a fall. Tell your doctor if you feel dizzy, or have vision changes or ringing in the ears.  Some people get   severe pain in the shoulder and have difficulty moving the arm where a shot was given. This happens very rarely.  Any medication can cause a severe allergic reaction. Such reactions from a vaccine are very rare, estimated at fewer than 1 in a million doses, and would happen within a few minutes to a few hours after the vaccination. As with any  medicine, there is a very remote chance of a vaccine causing a serious injury or death. The safety of vaccines is always being monitored. For more information, visit: www.cdc.gov/vaccinesafety/ 5. What if there is a serious reaction? What should I look for? Look for anything that concerns you, such as signs of a severe allergic reaction, very high fever, or unusual behavior. Signs of a severe allergic reaction can include hives, swelling of the face and throat, difficulty breathing, a fast heartbeat, dizziness, and weakness. These would usually start a few minutes to a few hours after the vaccination. What should I do?  If you think it is a severe allergic reaction or other emergency that can't wait, call 9-1-1 or get the person to the nearest hospital. Otherwise, call your doctor.  Afterward, the reaction should be reported to the Vaccine Adverse Event Reporting System (VAERS). Your doctor might file this report, or you can do it yourself through the VAERS web site at www.vaers.hhs.gov, or by calling 1-800-822-7967. ? VAERS does not give medical advice. 6. The National Vaccine Injury Compensation Program The National Vaccine Injury Compensation Program (VICP) is a federal program that was created to compensate people who may have been injured by certain vaccines. Persons who believe they may have been injured by a vaccine can learn about the program and about filing a claim by calling 1-800-338-2382 or visiting the VICP website at www.hrsa.gov/vaccinecompensation. There is a time limit to file a claim for compensation. 7. How can I learn more?  Ask your doctor. He or she can give you the vaccine package insert or suggest other sources of information.  Call your local or state health department.  Contact the Centers for Disease Control and Prevention (CDC): ? Call 1-800-232-4636 (1-800-CDC-INFO) ? Visit CDC's website at www.cdc.gov/vaccines CDC Td Vaccine VIS (08/30/15) This information is  not intended to replace advice given to you by your health care provider. Make sure you discuss any questions you have with your health care provider. Document Released: 03/04/2006 Document Revised: 01/26/2016 Document Reviewed: 01/26/2016 Elsevier Interactive Patient Education  2017 Elsevier Inc.  

## 2018-03-25 NOTE — Progress Notes (Signed)
Subjective:  Patient ID: Erika Boyer, female    DOB: [birth year]  Age: 22 y.o. MRN: [id number]  CC: f/u oral surgery  HPI  Erika Boyer a 22 y.o.femalewith a PMH of anxiety, depression, anemia, Graves Disease, HTN, Lupus, and Cerebellar degeneration presentsto f/u on oral surgery. Says the surgery went well and no longer feels pain. She did not encounter any complications such as prolonged bleeding, suppuration, facial swelling, f/c/n/v, headache, or tooth sensitivity/dry socket.     Outpatient Medications Prior to Visit  Medication Sig Dispense Refill  . albuterol (PROAIR HFA) 108 (90 Base) MCG/ACT inhaler Inhale 2 puffs into the lungs every 4 (four) hours as needed for wheezing or shortness of breath.    . hydroxychloroquine (PLAQUENIL) 200 MG tablet TK 2 TS PO QD WF OR MILK  2  . EPINEPHrine 0.3 mg/0.3 mL IJ SOAJ injection USE AS DIRECTED AND THEN CALL 911    . Misc. Devices (WALL GRAB BAR) MISC 1 kit by Does not apply route daily. 1 each 0  . HYDROcodone-acetaminophen (NORCO/VICODIN) 5-325 MG tablet Take 1 tablet 1 hour prior to appointment tomorrow to have packing removed 2 tablet 0   No facility-administered medications prior to visit.      ROS Review of Systems  Constitutional: Negative for chills, fever and malaise/fatigue.  Eyes: Negative for blurred vision.  Respiratory: Negative for shortness of breath.   Cardiovascular: Negative for chest pain and palpitations.  Gastrointestinal: Negative for abdominal pain and nausea.  Genitourinary: Negative for dysuria and hematuria.  Musculoskeletal: Negative for joint pain and myalgias.  Skin: Negative for rash.  Neurological: Negative for tingling and headaches.  Psychiatric/Behavioral: Negative for depression. The patient is not nervous/anxious.     Objective:  BP 107/73 (BP Location: Left Arm, Patient Position: Sitting, Cuff Size: Large)   Pulse 77   Temp 97.8 F (36.6 C) (Oral)   Ht 5' 5"  (1.651 m)   Wt  272 lb 12.8 oz (123.7 kg)   LMP 03/25/2018   SpO2 100%   BMI 45.40 kg/m   BP/Weight 03/25/2018 02/16/2018 08/27/8117  Systolic BP 147 829 562  Diastolic BP 73 68 65  Wt. (Lbs) 272.8 - 275  BMI 45.4 - 45.76      Physical Exam  Constitutional: She is oriented to person, place, and time.  Well developed, obese, NAD, polite  HENT:  Head: Normocephalic and atraumatic.  No bleeding, swelling, or suppuration of the gums. No facial, tongue, or pharyngeal swelling. Uvula midline. Tonsils 2+ bilaterally.   Eyes: No scleral icterus.  Neck: Normal range of motion. Neck supple. No thyromegaly present.  Cardiovascular: Normal rate, regular rhythm and normal heart sounds.  Pulmonary/Chest: Effort normal and breath sounds normal.  Musculoskeletal: She exhibits no edema.  Lymphadenopathy:    She has no cervical adenopathy.  Neurological: She is alert and oriented to person, place, and time.  Skin: Skin is warm and dry. No rash noted. No erythema. No pallor.  Psychiatric: She has a normal mood and affect. Her behavior is normal. Thought content normal.  Vitals reviewed.    Assessment & Plan:   1. Follow-up examination following surgery - Oral surgery seems to have went well. Pt without complications or symptoms.   2. Screening for chlamydial disease - Urine cytology ancillary only; Future  3. Need for Tdap vaccination - Tdap vaccine greater than or equal to 7yo IM   Follow-up: Return if symptoms worsen or fail to improve.   [name] PA

## 2018-03-26 ENCOUNTER — Other Ambulatory Visit (HOSPITAL_COMMUNITY)
Admission: RE | Admit: 2018-03-26 | Discharge: 2018-03-26 | Disposition: A | Discharge status: Home / Self Care | Payer: Medicare Other | Source: Ambulatory Visit | Attending: Physician Assistant | Admitting: Physician Assistant

## 2018-03-26 DIAGNOSIS — Z118 Encounter for screening for other infectious and parasitic diseases: Secondary | ICD-10-CM | POA: Insufficient documentation

## 2018-03-26 NOTE — Addendum Note (Signed)
Addended by: [name] on: 03/26/2018 08:50 AM   Modules accepted: Orders

## 2018-03-31 ENCOUNTER — Telehealth (INDEPENDENT_AMBULATORY_CARE_PROVIDER_SITE_OTHER): Payer: Self-pay

## 2018-03-31 DIAGNOSIS — L732 Hidradenitis suppurativa: Secondary | ICD-10-CM | POA: Diagnosis not present

## 2018-03-31 LAB — URINE CYTOLOGY ANCILLARY ONLY: CHLAMYDIA, DNA PROBE: NEGATIVE

## 2018-03-31 NOTE — Telephone Encounter (Signed)
Patient is aware that chlamydia screening is negative. Erika Boyer S Marc Sivertsen, CMA  

## 2018-03-31 NOTE — Telephone Encounter (Signed)
-----   Message from Loletta Specter, PA-C sent at 03/31/2018  1:38 PM EST ----- Chlamydia screening negative.

## 2018-05-05 DIAGNOSIS — L732 Hidradenitis suppurativa: Secondary | ICD-10-CM | POA: Diagnosis not present

## 2018-06-17 DIAGNOSIS — L732 Hidradenitis suppurativa: Secondary | ICD-10-CM | POA: Diagnosis not present

## 2018-06-18 NOTE — Progress Notes (Deleted)
[facility]  PATIENT: [name] DOB: 05-21-1996   REASON FOR VISIT: *** HISTORY FROM:    HISTORY OF PRESENT ILLNESS:  Interval history 06/18/2017: New problem today. Anxiety is worsening. As a result of her Illness and disability. She is very anxious, her disability affectes her, she is self conscious. She has mood swings due to this and gets agitated. Since the cat died she is having more emotional problems. Animals calm her, she is happier with animals and more interactive. She improved in the past. Recently she is more moody, more quick to anger. Lots of anxiety. She is having difficulty sleeping due to anxiety. She gets upset. Mother is here and provides most information. Patient declines any medication management for anxiety.   HPI:  Kem Parcher is a 23 y.o. female here as a referral from Dr. Altamease Oiler for cerebellar atrophy. Past medical history of morbid obesity, acute kidney injury, drug-induced lupus erythematosus, cognitive disorder, ligament hypertension, cerebellar atrophy. 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 MRI indicated cerebellar/pontine degeneration. Patient was worked up in the past possibly with genetics not clear from records. Cerebellar degeneration of unknown etiology, multiple needs. She has been evaluated at [facility] in [place]. She was seeing neurology for about 5 years there. They did genetic testing. She was diagnosed with post-viral cerebellar degeneration. She just started falling at the age 70. She last fell a few days ago.  She has physical therapy to her house. Since being out of the hospital she feels better. She is taking a lot of medication. Physical therapy is helping. She feels her balance is better. Her writing is better. She gets tremors, they are off and on. She gets tremors when she tries to concentrate. They are in  her head. The tremor does not bother her. It doesn't affect her, she gets double which is chronic, no problems speaking, she speaks slowly. She graduated HS. She didn;t like college REVIEW OF SYSTEMS: Full 14 system review of systems performed and notable only for those listed, all others are neg:  Constitutional: neg  Cardiovascular: neg Ear/Nose/Throat: neg  Skin: neg Eyes: neg Respiratory: neg Gastroitestinal: neg  Hematology/Lymphatic: neg  Endocrine: neg Musculoskeletal:neg Allergy/Immunology: neg Neurological: neg Psychiatric: neg Sleep : neg   ALLERGIES: Allergies  Allergen Reactions  . Shellfish Allergy Anaphylaxis  . Morphine And Related Itching  . Mushroom Extract Complex Other (See Comments)    Mother is allergic, so patient avoids these  . Tapazole [Methimazole] Swelling and Rash    WELTS (also) Mother suspects this MAY be an allergy, as the onset of symptoms coincided with this being started    HOME MEDICATIONS: Outpatient Medications Prior to Visit  Medication Sig Dispense Refill  . albuterol (PROAIR HFA) 108 (90 Base) MCG/ACT inhaler Inhale 2 puffs into the lungs every 4 (four) hours as needed for wheezing or shortness of breath.    . EPINEPHrine 0.3 mg/0.3 mL IJ SOAJ injection USE AS DIRECTED AND THEN CALL 911    . hydroxychloroquine (PLAQUENIL) 200 MG tablet TK 2 TS PO QD WF OR MILK  2  . Misc. Devices (WALL GRAB BAR) MISC 1 kit by Does not apply route daily. 1 each 0   No facility-administered medications prior to visit.     PAST MEDICAL HISTORY: Past Medical History:  Diagnosis Date  . Anemia   . Anxiety   . Arthritis    "  right knee" (10/11/2016)  . Cerebellar degeneration ([place])   . Chronic bronchitis ([place])   . Daily headache   . Depression   . Graves disease 2012   hx; "was on RX; it disappeared" (10/11/2016)  . History of blood transfusion    "related to the anemia" (10/11/2016)  . Hypertension   . Lupus ([place])    "not sure what kind"  (10/11/2016)  . Pneumonia 10/03/2016    PAST SURGICAL HISTORY: Past Surgical History:  Procedure Laterality Date  . PERICARDIOCENTESIS N/A 10/04/2016   Procedure: Pericardiocentesis;  Surgeon: Adrian Prows, MD;  Location: [facility];  Service: Cardiovascular;  Laterality: N/A;  . REDUCTION MAMMAPLASTY Bilateral 2012  . TONSILLECTOMY      FAMILY HISTORY: Family History  Problem Relation Age of Onset  . Thalassemia Mother   . Arthritis Mother   . Breast cancer Mother        Mastectomy in early 62's.    . High blood pressure Father   . Mental illness Father        question bipolar disorder per wife  . Sickle cell trait Maternal Aunt   . Breast cancer Maternal Aunt        4 maternal aunts w/breast cancer  . Breast cancer Maternal Grandmother        Bone  . Multiple sclerosis Cousin     SOCIAL HISTORY: Social History   Socioeconomic History  . Marital status: Single    Spouse name: Not on file  . Number of children: 0  . Years of education: 79  . Highest education level: Some college, no degree  Occupational History  . Not on file  Social Needs  . Financial resource strain: Not on file  . Food insecurity:    Worry: Not on file    Inability: Not on file  . Transportation needs:    Medical: Not on file    Non-medical: Not on file  Tobacco Use  . Smoking status: Never Smoker  . Smokeless tobacco: Never Used  Substance and Sexual Activity  . Alcohol use: No  . Drug use: No  . Sexual activity: Yes  Lifestyle  . Physical activity:    Days per week: Not on file    Minutes per session: Not on file  . Stress: Not on file  Relationships  . Social connections:    Talks on phone: Not on file    Gets together: Not on file    Attends religious service: Not on file    Active member of club or organization: Not on file    Attends meetings of clubs or organizations: Not on file    Relationship status: Not on file  . Intimate partner violence:    Fear of current or  ex partner: Not on file    Emotionally abused: Not on file    Physically abused: Not on file    Forced sexual activity: Not on file  Other Topics Concern  . Not on file  Social History Narrative   Lives at home w/ her mom   Right-handed   Caffeine: occasional coffee     PHYSICAL EXAM  There were no vitals filed for this visit. There is no height or weight on file to calculate BMI.  Generalized: Well developed, in no acute distress  Head: normocephalic and atraumatic,. Oropharynx benign  Neck: Supple, no carotid bruits  Cardiac: Regular rate rhythm, no murmur  Musculoskeletal: No deformity   Neurological examination   Mentation:  Alert oriented to time, place, history taking. Attention span and concentration appropriate. Recent and remote memory intact.  Follows all commands speech and language fluent.   Cranial nerve II-XII: Fundoscopic exam reveals sharp disc margins.Pupils were equal round reactive to light extraocular movements were full, visual field were full on confrontational test. Facial sensation and strength were normal. hearing was intact to finger rubbing bilaterally. Uvula tongue midline. head turning and shoulder shrug were normal and symmetric.Tongue protrusion into cheek strength was normal. Motor: normal bulk and tone, full strength in the BUE, BLE, fine finger movements normal, no pronator drift. No focal weakness Sensory: normal and symmetric to light touch, pinprick, and  Vibration, proprioception  Coordination: finger-nose-finger, heel-to-shin bilaterally, no dysmetria Reflexes: Brachioradialis 2/2, biceps 2/2, triceps 2/2, patellar 2/2, Achilles 2/2, plantar responses were flexor bilaterally. Gait and Station: Rising up from seated position without assistance, normal stance,  moderate stride, good arm swing, smooth turning, able to perform tiptoe, and heel walking without difficulty. Tandem gait is steady  DIAGNOSTIC DATA (LABS, IMAGING, TESTING) - I reviewed  patient records, labs, notes, testing and imaging myself where available.  Lab Results  Component Value Date   WBC 4.5 11/04/2017   HGB 11.1 11/04/2017   HCT 36.1 11/04/2017   MCV 79 11/04/2017   PLT 279 11/04/2017      Component Value Date/Time   NA 139 01/08/2017 1710   K 3.9 01/08/2017 1710   CL 101 01/08/2017 1710   CO2 21 01/08/2017 1710   GLUCOSE 81 01/08/2017 1710   GLUCOSE 91 10/24/2016 0539   BUN 7 01/08/2017 1710   CREATININE 0.70 01/08/2017 1710   CALCIUM 8.6 (L) 01/08/2017 1710   PROT 5.8 (L) 10/13/2016 0443   PROT 7.7 08/23/2016 1548   ALBUMIN 2.3 (L) 10/13/2016 0443   ALBUMIN 3.5 08/23/2016 1548   AST 12 (L) 10/13/2016 0443   ALT 15 10/13/2016 0443   ALKPHOS 39 10/13/2016 0443   BILITOT 0.5 10/13/2016 0443   BILITOT 0.2 08/23/2016 1548   GFRNONAA 124 01/08/2017 1710   GFRAA 143 01/08/2017 1710   Lab Results  Component Value Date   CHOL 82 10/04/2016   TRIG 139 10/03/2016   Lab Results  Component Value Date   HGBA1C 5.3 10/13/2016   Lab Results  Component Value Date   VITAMINB12 275 05/07/2017   Lab Results  Component Value Date   TSH 1.11 06/27/2017    ***  ASSESSMENT AND PLAN  23 y.o. year old female  has a past medical history of Anemia, Anxiety, Arthritis, Cerebellar degeneration ([place]), Chronic bronchitis ([place]), Daily headache, Depression, Graves disease (2012), History of blood transfusion, Hypertension, Lupus ([place]), and Pneumonia (10/03/2016). 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   - They would like a companion animal. If she provides documentation I am happy to fill it out. - She is stable, f/u one year - Mother says she is anxious, patient declines. Treatment and says she is not  anxious. Offered counseling and therapy, declined. Follow up with pcp.   Dennie Bible, [facility], [facility], APRN  [facility] [address], [place], [place] [address] [phone]

## 2018-06-19 ENCOUNTER — Ambulatory Visit: Payer: Medicare Other | Admitting: Nurse Practitioner

## 2018-06-19 ENCOUNTER — Telehealth: Payer: Self-pay | Admitting: *Deleted

## 2018-06-19 NOTE — Telephone Encounter (Signed)
Patient was no show for follow up with NP today.  

## 2018-06-24 ENCOUNTER — Encounter: Payer: Self-pay | Admitting: Nurse Practitioner

## 2018-07-01 NOTE — Progress Notes (Signed)
[facility]  PATIENT: Erika Boyer DOB: 06-May-1996   REASON FOR VISIT: Follow-up for  anxiety,post-viral cerebellar degeneration, new complaint of headaches HISTORY FROM: Patient and mom    HISTORY OF PRESENT ILLNESS:UPDATE 2/12/2020CM Erika Boyer, 23 year old female returns for follow-up with a history of postviral cerebellar degeneration and anxiety.  She has a new complaint today of headaches.  She eats a lot of fast food according to the mom which would contribute to that problem She also takes a lot of ibuprofen for joint pain, more than the recommended dose according to the mom and patient was made aware that this can cause rebound headache.  She does not exercise.  She has occasional dizziness.  She denies difficulty sleeping.  Mom provides most of the history.  She has a history of lupus  and is on Plaquenil.  She returns for reevaluation  Interval history 06/18/2017: AANew problem today. Anxiety is worsening. As a result of her Illness and disability. She is very anxious, her disability affectes her, she is self conscious. She has mood swings due to this and gets agitated. Since the cat died she is having more emotional problems. Animals calm her, she is happier with animals and more interactive. She improved in the past. Recently she is more moody, more quick to anger. Lots of anxiety. She is having difficulty sleeping due to anxiety. She gets upset. Mother is here and provides most information. Patient declines any medication management for anxiety.   HPI:  Erika Boyer is a 23 y.o. female here as a referral from Dr. Altamease Oiler for cerebellar atrophy. Past medical history of morbid obesity, acute kidney injury, drug-induced lupus erythematosus, cognitive disorder, ligament hypertension, cerebellar atrophy. 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 MRI  indicated cerebellar/pontine degeneration. Patient was worked up in the past possibly with genetics not clear from records. Cerebellar degeneration of unknown etiology, multiple needs. She has been evaluated at [facility] in [place]. She was seeing neurology for about 5 years there. They did genetic testing. She was diagnosed with post-viral cerebellar degeneration. She just started falling at the age 7. She last fell a few days ago.  She has physical therapy to her house. Since being out of the hospital she feels better. She is taking a lot of medication. Physical therapy is helping. She feels her balance is better. Her writing is better. She gets tremors, they are off and on. She gets tremors when she tries to concentrate. They are in her head. The tremor does not bother her. It doesn't affect her, she gets double which is chronic, no problems speaking, she speaks slowly. She graduated HS. She didn;t like college REVIEW OF SYSTEMS: Full 14 system review of systems performed and notable only for those listed, all others are neg:  Constitutional: neg  Cardiovascular: neg Ear/Nose/Throat: neg  Skin: neg Eyes: neg Respiratory: neg Gastroitestinal: neg  Hematology/Lymphatic: neg  Endocrine: neg Musculoskeletal: Gait abnormality Allergy/Immunology: neg Neurological: Dizziness headache Psychiatric: neg Sleep : neg   ALLERGIES: Allergies  Allergen Reactions  . Shellfish Allergy Anaphylaxis  . Morphine And Related Itching  . Mushroom Extract Complex Other (See Comments)    Mother is allergic, so patient avoids these  . Tapazole [Methimazole] Swelling and Rash    WELTS (also) Mother suspects this MAY be an allergy, as the onset of symptoms coincided with this being started    HOME MEDICATIONS: Outpatient Medications  Prior to Visit  Medication Sig Dispense Refill  . albuterol (PROAIR HFA) 108 (90 Base) MCG/ACT inhaler Inhale 2 puffs into the lungs every 4 (four) hours as needed for wheezing or  shortness of breath.    . EPINEPHrine 0.3 mg/0.3 mL IJ SOAJ injection USE AS DIRECTED AND THEN CALL 911    . hydroxychloroquine (PLAQUENIL) 200 MG tablet TK 2 TS PO QD WF OR MILK  2  . Misc. Devices (WALL GRAB BAR) MISC 1 kit by Does not apply route daily. 1 each 0   No facility-administered medications prior to visit.     PAST MEDICAL HISTORY: Past Medical History:  Diagnosis Date  . Anemia   . Anxiety   . Arthritis    "right knee" (10/11/2016)  . Cerebellar degeneration ([place])   . Chronic bronchitis ([place])   . Daily headache   . Depression   . Graves disease 2012   hx; "was on RX; it disappeared" (10/11/2016)  . History of blood transfusion    "related to the anemia" (10/11/2016)  . Hypertension   . Lupus ([place])    "not sure what kind" (10/11/2016)  . Pneumonia 10/03/2016    PAST SURGICAL HISTORY: Past Surgical History:  Procedure Laterality Date  . PERICARDIOCENTESIS N/A 10/04/2016   Procedure: Pericardiocentesis;  Surgeon: Adrian Prows, MD;  Location: [facility];  Service: Cardiovascular;  Laterality: N/A;  . REDUCTION MAMMAPLASTY Bilateral 2012  . TONSILLECTOMY      FAMILY HISTORY: Family History  Problem Relation Age of Onset  . Thalassemia Mother   . Arthritis Mother   . Breast cancer Mother        Mastectomy in early 39's.    . High blood pressure Father   . Mental illness Father        question bipolar disorder per wife  . Sickle cell trait Maternal Aunt   . Breast cancer Maternal Aunt        4 maternal aunts w/breast cancer  . Breast cancer Maternal Grandmother        Bone  . Multiple sclerosis Cousin     SOCIAL HISTORY: Social History   Socioeconomic History  . Marital status: Single    Spouse name: Not on file  . Number of children: 0  . Years of education: 22  . Highest education level: Some college, no degree  Occupational History  . Not on file  Social Needs  . Financial resource strain: Not on file  . Food insecurity:    Worry: Not  on file    Inability: Not on file  . Transportation needs:    Medical: Not on file    Non-medical: Not on file  Tobacco Use  . Smoking status: Never Smoker  . Smokeless tobacco: Never Used  Substance and Sexual Activity  . Alcohol use: No  . Drug use: No  . Sexual activity: Yes  Lifestyle  . Physical activity:    Days per week: Not on file    Minutes per session: Not on file  . Stress: Not on file  Relationships  . Social connections:    Talks on phone: Not on file    Gets together: Not on file    Attends religious service: Not on file    Active member of club or organization: Not on file    Attends meetings of clubs or organizations: Not on file    Relationship status: Not on file  . Intimate partner violence:    Fear  of current or ex partner: Not on file    Emotionally abused: Not on file    Physically abused: Not on file    Forced sexual activity: Not on file  Other Topics Concern  . Not on file  Social History Narrative   Lives at home w/ her mom   Right-handed   Caffeine: occasional coffee     PHYSICAL EXAM  Vitals:   07/02/18 1025  Weight: 265 lb (120.2 kg)  Height: _0  (1.651 m)   Body mass index is 44.1 kg/m.  Generalized: Well developed, morbidly obese female in no acute distress  Head: normocephalic and atraumatic,. Oropharynx benign  Neck: Supple,  Musculoskeletal: No deformity   Neurological examination   Mentation: Alert oriented to time, place, history per mom Attention span and concentration impaired Recent and remote memory impaired  Follows all commands speech and language fluent.   Cranial nerve II-XII: .Pupils were equal round reactive to light extraocular movements were full, visual field were full on confrontational test.  End gaze nystagmus. facial sensation and strength were normal. hearing was intact to finger rubbing bilaterally. Uvula tongue midline. head turning and shoulder shrug were normal and symmetric.Tongue protrusion into  cheek strength was normal. Motor: normal bulk and tone, full strength in the BUE, BLE, Sensory: normal and symmetric to light touch, pinprick, and  Vibration, in her extremities Coordination: finger-nose-finger, mild dysmetria Reflexes: Symmetric upper and lower plantar responses were flexor bilaterally. Gait and Station: Rising up from seated position without assistance, wide-based gait can walk on heels and toes, very unsteady with tandem  DIAGNOSTIC DATA (LABS, IMAGING, TESTING) - I reviewed patient records, labs, notes, testing and imaging myself where available.  Lab Results  Component Value Date   WBC 4.5 11/04/2017   HGB 11.1 11/04/2017   HCT 36.1 11/04/2017   MCV 79 11/04/2017   PLT 279 11/04/2017      Component Value Date/Time   NA 139 01/08/2017 1710   K 3.9 01/08/2017 1710   CL 101 01/08/2017 1710   CO2 21 01/08/2017 1710   GLUCOSE 81 01/08/2017 1710   GLUCOSE 91 10/24/2016 0539   BUN 7 01/08/2017 1710   CREATININE 0.70 01/08/2017 1710   CALCIUM 8.6 (L) 01/08/2017 1710   PROT 5.8 (L) 10/13/2016 0443   PROT 7.7 08/23/2016 1548   ALBUMIN 2.3 (L) 10/13/2016 0443   ALBUMIN 3.5 08/23/2016 1548   AST 12 (L) 10/13/2016 0443   ALT 15 10/13/2016 0443   ALKPHOS 39 10/13/2016 0443   BILITOT 0.5 10/13/2016 0443   BILITOT 0.2 08/23/2016 1548   GFRNONAA 124 01/08/2017 1710   GFRAA 143 01/08/2017 1710   Lab Results  Component Value Date   CHOL 82 10/04/2016   TRIG 139 10/03/2016   Lab Results  Component Value Date   HGBA1C 5.3 10/13/2016   Lab Results  Component Value Date   VITAMINB12 275 05/07/2017   Lab Results  Component Value Date   TSH 1.11 06/27/2017      ASSESSMENT AND PLAN  23 year old female with history of 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 MRI indicated cerebellar/pontine degeneration  Presumably postinfectious.  Her neuro exam is stable.  She declines treatment for her anxiety.   PLAN: Has a gait abnormality given exercises for balance to be  perform several times a day Given list of foods that cause headache,  reviewed these with the patient Stay well-hydrated Take ibuprofen only as indicated on bottle,  too match can cause rebound headaches F/U yearly  Dennie Bible, [facility], [facility], [facility] [address], [place], [place] [address] [phone]

## 2018-07-02 ENCOUNTER — Ambulatory Visit (INDEPENDENT_AMBULATORY_CARE_PROVIDER_SITE_OTHER): Payer: Medicare Other | Admitting: Nurse Practitioner

## 2018-07-02 ENCOUNTER — Encounter: Payer: Self-pay | Admitting: Nurse Practitioner

## 2018-07-02 VITALS — BP 124/62 | HR 81 | Ht 65.0 in | Wt 265.0 lb

## 2018-07-02 DIAGNOSIS — Z6841 Body Mass Index (BMI) 40.0 and over, adult: Secondary | ICD-10-CM | POA: Diagnosis not present

## 2018-07-02 DIAGNOSIS — R51 Headache: Secondary | ICD-10-CM | POA: Diagnosis not present

## 2018-07-02 DIAGNOSIS — R269 Unspecified abnormalities of gait and mobility: Secondary | ICD-10-CM | POA: Diagnosis not present

## 2018-07-02 DIAGNOSIS — G319 Degenerative disease of nervous system, unspecified: Secondary | ICD-10-CM | POA: Diagnosis not present

## 2018-07-02 DIAGNOSIS — R519 Headache, unspecified: Secondary | ICD-10-CM | POA: Insufficient documentation

## 2018-07-02 NOTE — Patient Instructions (Signed)
Has a gait abnormality given exercises for balance Given list of foods that cause headache F/U yearly

## 2018-07-03 NOTE — Progress Notes (Signed)
Made any corrections needed, and agree with history, physical, neuro exam,assessment and plan as stated.     Duard Spiewak, MD [facility]  

## 2018-07-10 IMAGING — CR DG CHEST 1V PORT
1 series · 1 of 1 positions shown · non-contrast
Comparison: 10/04/2016

CLINICAL DATA: Endotracheal tube.

EXAM:
PORTABLE CHEST 1 VIEW

[AP]
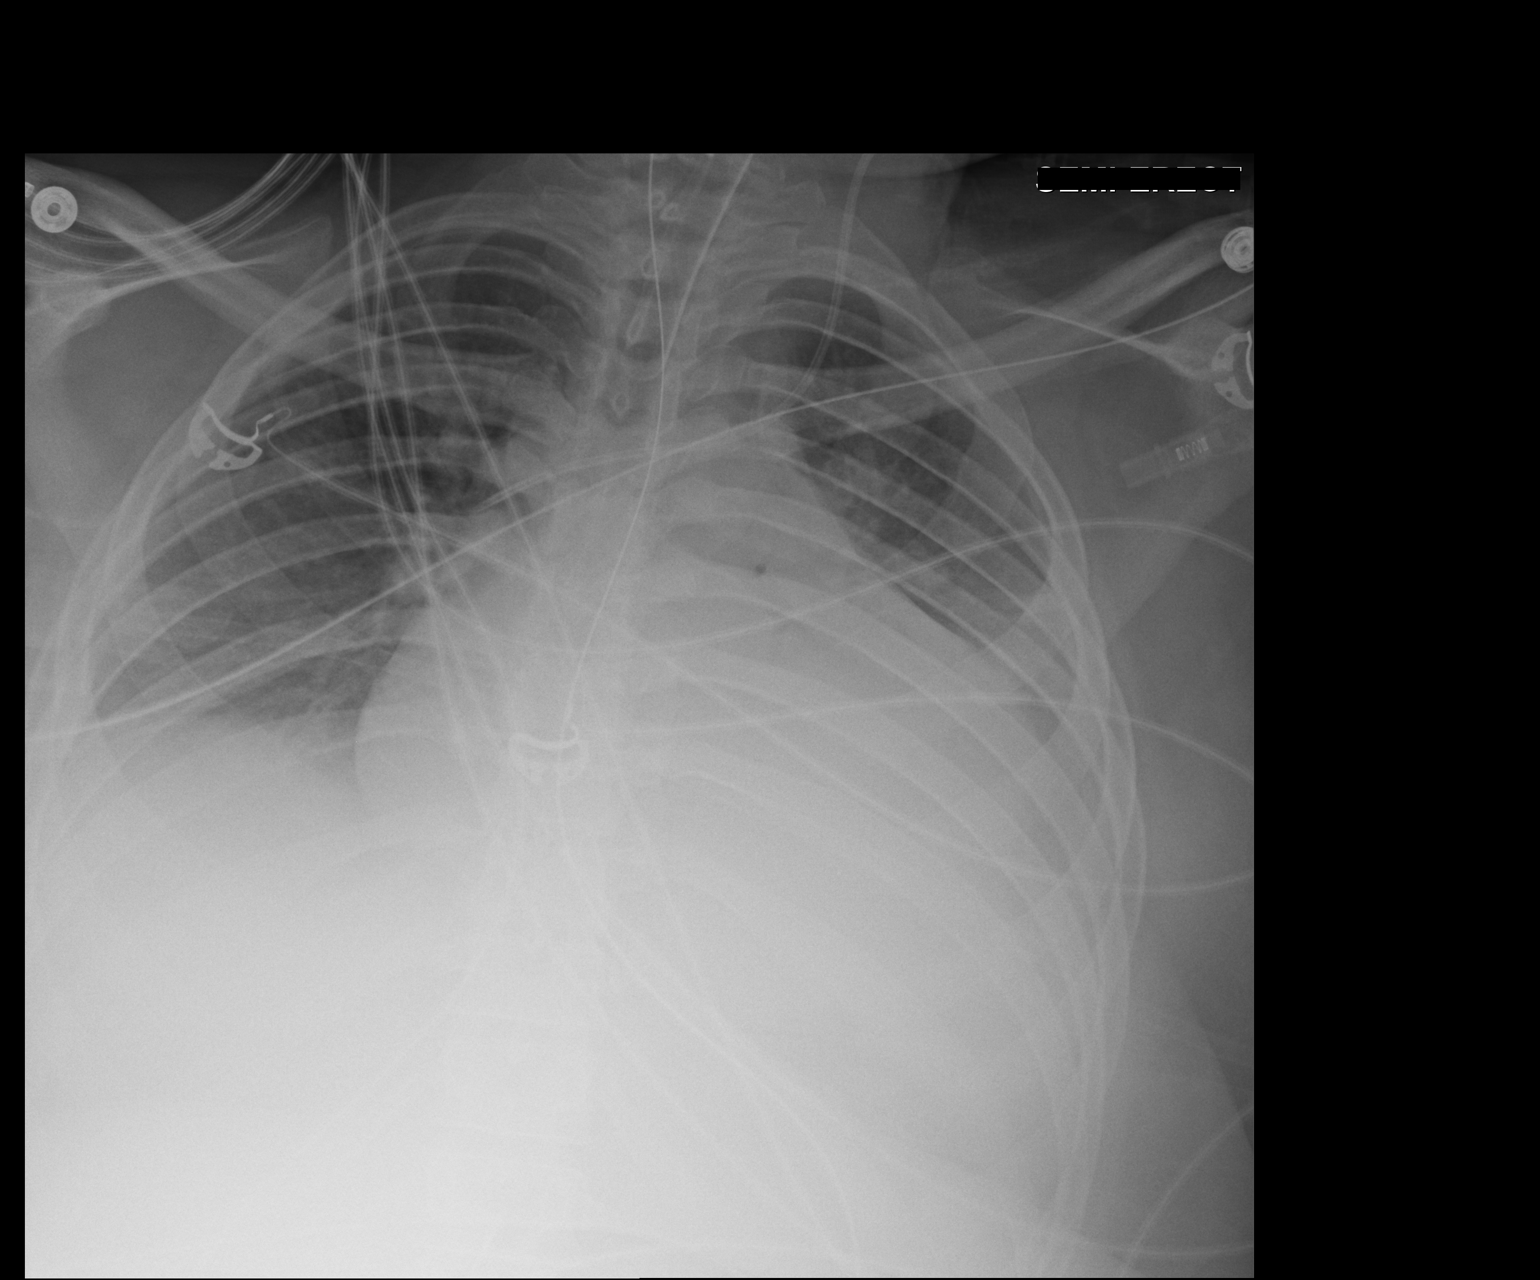

[1 of 1 positions shown; findings below may reference images not displayed]

FINDINGS: Endotracheal tube terminates 3.2 cm above carina.Nasogastric tube
extends beyond the inferior aspect of the film. Left-sided internal
jugular line with tip at high SVC. Midline trachea. Cardiomegaly
accentuated by AP portable technique. Layering left greater than
right pleural effusions were likely similar. No pneumothorax.
Interstitial and airspace disease is worse on the left. Slightly
improved since the prior exam.
IMPRESSION: Slightly improved appearance of interstitial and airspace disease
which likely represents pulmonary edema. Infection or aspiration
could look similar.

Layering bilateral pleural effusions are felt to be similar.

## 2018-07-11 IMAGING — CR DG CHEST 1V PORT
1 series · 1 of 1 positions shown · non-contrast
Comparison: 10/05/2016 and CT chest 10/03/2016.

CLINICAL DATA: Acute respiratory failure.

EXAM:
PORTABLE CHEST 1 VIEW

[AP]
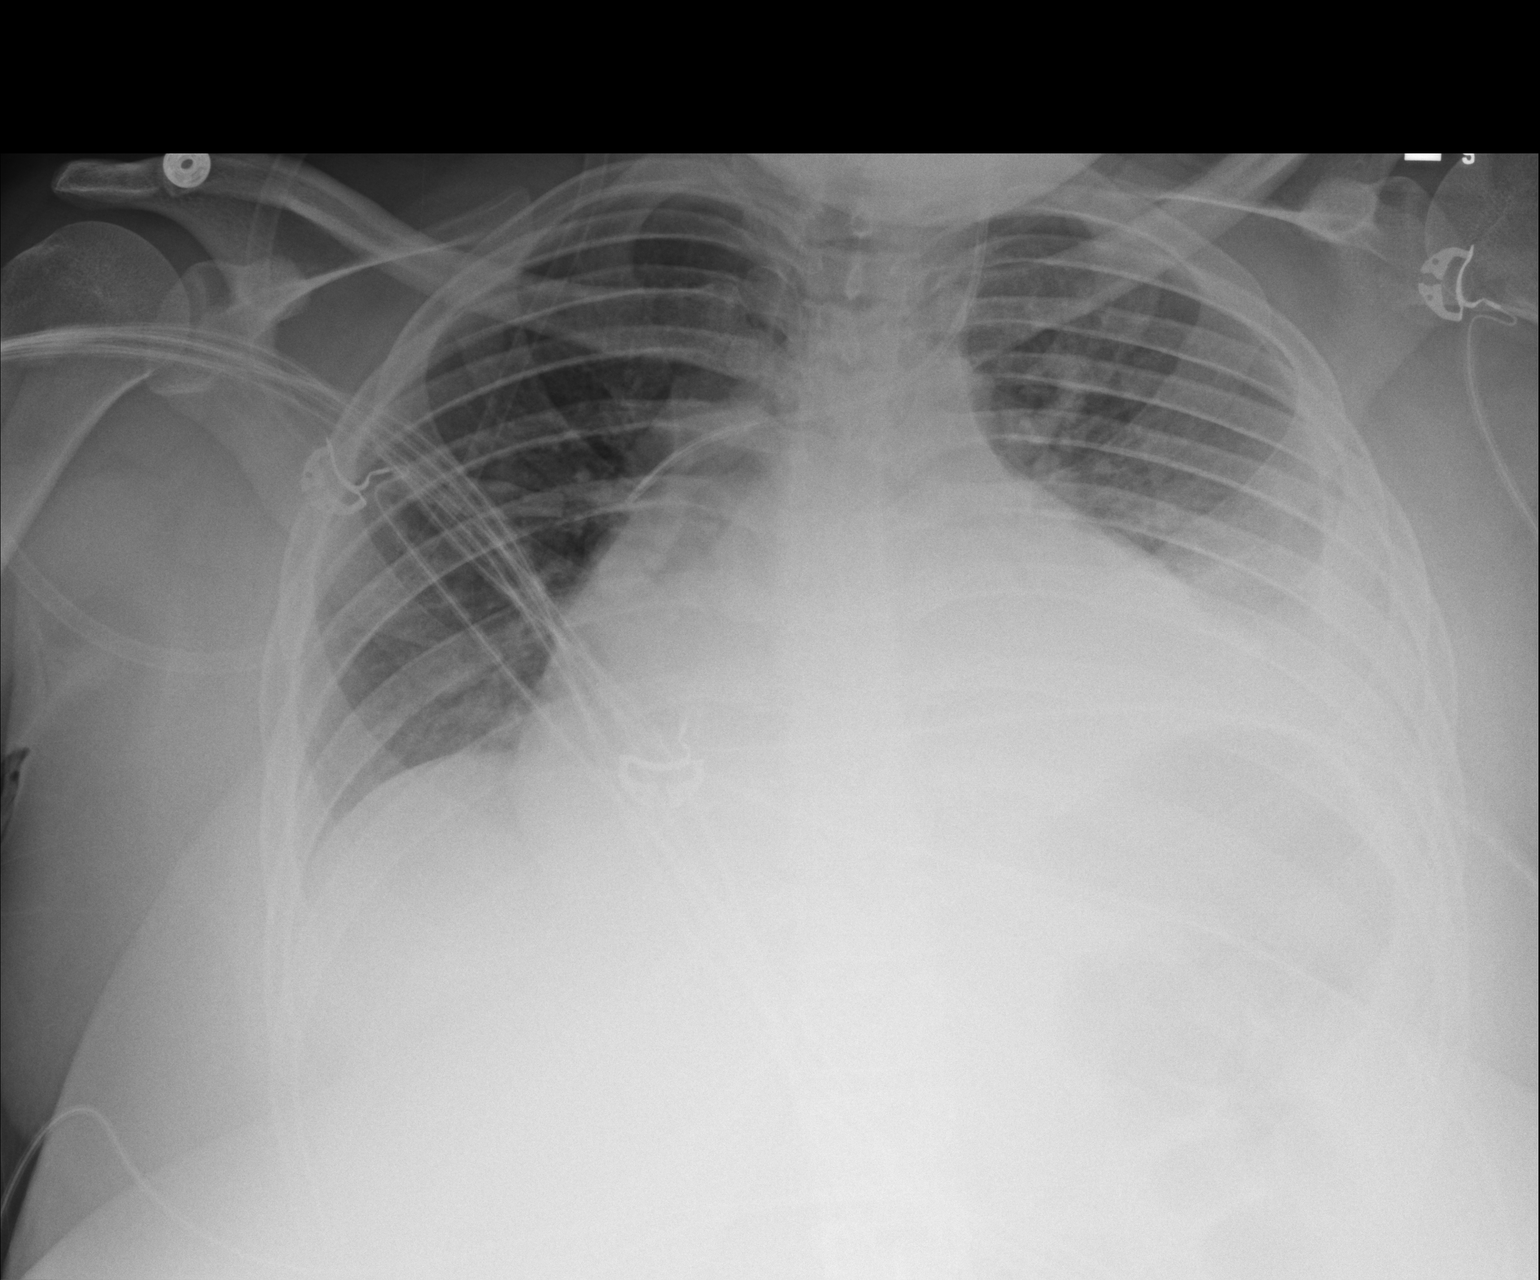

[1 of 1 positions shown; findings below may reference images not displayed]

FINDINGS: Left IJ central line tip projects over the SVC. Interval extubation.
Nasogastric has been removed. Cardiopericardial silhouette is
enlarged, stable. Lungs are low in volume with mild diffuse airspace
opacification and a moderate to large left pleural effusion. Left
lower lobe collapse/consolidation. Findings are similar to
yesterday's exam.
IMPRESSION: 1. Left lower lobe collapse/consolidation, worrisome for pneumonia.
2. Enlarged cardiopericardial silhouette, shown to represent a large
pericardial effusion on 10/03/2016.
3. Moderate to large left pleural effusion.
4. Mild diffuse bilateral airspace opacification may be due to low
lung volumes and atelectasis. Difficult to exclude edema.

## 2018-07-13 IMAGING — CR DG CHEST 2V
2 series · 2 of 2 positions shown · non-contrast
Comparison: Two days prior

CLINICAL DATA: Shortness of breath and weakness

EXAM:
CHEST  2 VIEW

[chest pa]
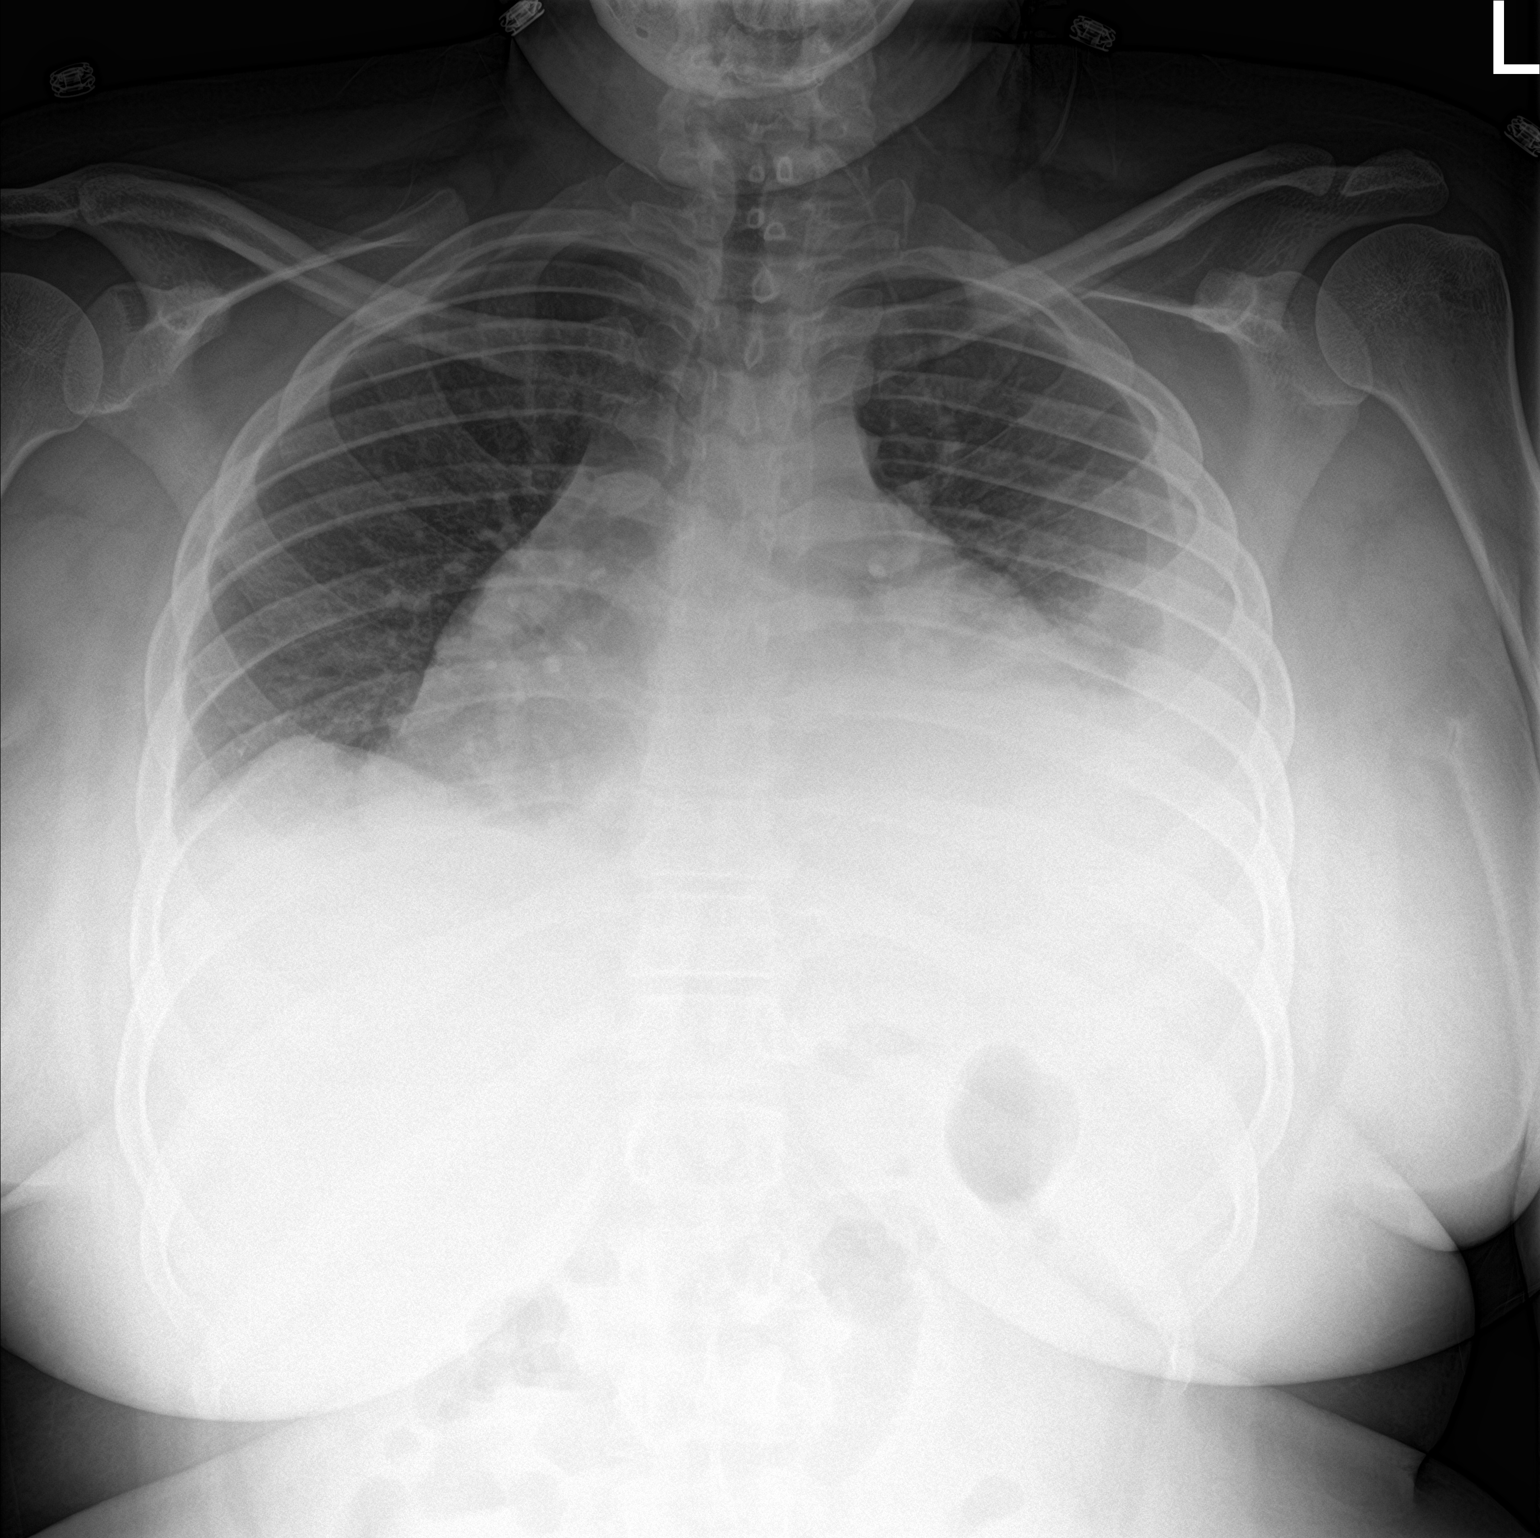

[chest lat]
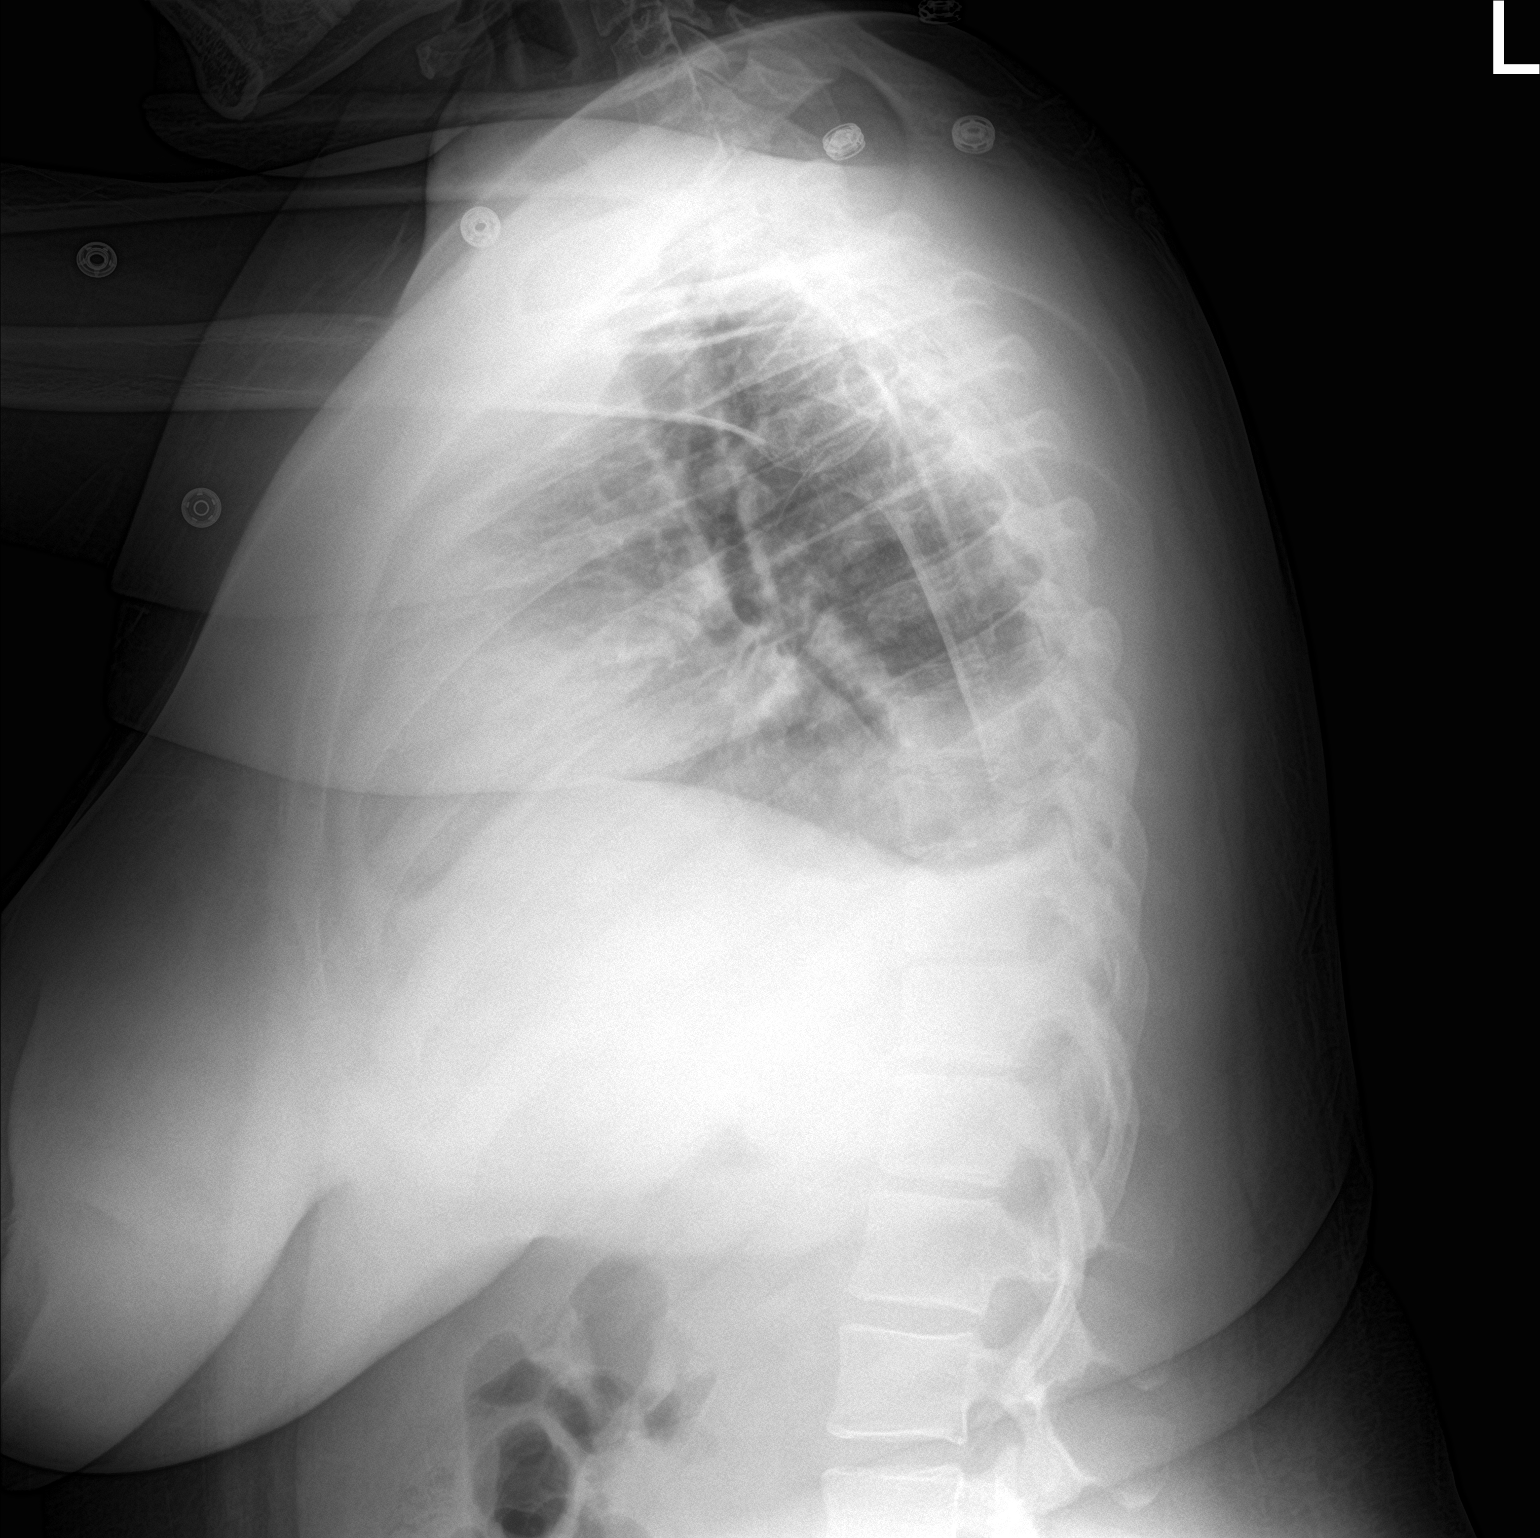

[2 of 2 positions shown; findings below may reference images not displayed]

FINDINGS: Cardiopericardial enlargement that is similar to priors. There has
been pericardiocentesis per EMR. Moderate left pleural effusion
tracking laterally. The underlying left lower lobe is opacified,
nonspecific between pneumonia and atelectasis. No pneumothorax.
IMPRESSION: 1. Unchanged moderate left pleural effusion and opacified left lower
lobe.
2. Persistent cardiopericardial enlargement despite
pericardiocentesis.

## 2018-07-22 DIAGNOSIS — M329 Systemic lupus erythematosus, unspecified: Secondary | ICD-10-CM | POA: Diagnosis not present

## 2018-07-22 DIAGNOSIS — J90 Pleural effusion, not elsewhere classified: Secondary | ICD-10-CM | POA: Diagnosis not present

## 2018-07-22 DIAGNOSIS — G319 Degenerative disease of nervous system, unspecified: Secondary | ICD-10-CM | POA: Diagnosis not present

## 2018-07-22 DIAGNOSIS — Z6841 Body Mass Index (BMI) 40.0 and over, adult: Secondary | ICD-10-CM | POA: Diagnosis not present

## 2018-07-22 DIAGNOSIS — I313 Pericardial effusion (noninflammatory): Secondary | ICD-10-CM | POA: Diagnosis not present

## 2018-07-22 DIAGNOSIS — Z79899 Other long term (current) drug therapy: Secondary | ICD-10-CM | POA: Diagnosis not present

## 2018-08-05 ENCOUNTER — Other Ambulatory Visit: Payer: Self-pay

## 2018-08-05 ENCOUNTER — Encounter (INDEPENDENT_AMBULATORY_CARE_PROVIDER_SITE_OTHER): Payer: Self-pay | Admitting: Primary Care

## 2018-08-05 ENCOUNTER — Ambulatory Visit (INDEPENDENT_AMBULATORY_CARE_PROVIDER_SITE_OTHER): Payer: Medicare Other | Admitting: Primary Care

## 2018-08-05 VITALS — BP 95/73 | HR 74 | Temp 97.9°F | Resp 16 | Wt 269.0 lb

## 2018-08-05 DIAGNOSIS — Z118 Encounter for screening for other infectious and parasitic diseases: Secondary | ICD-10-CM | POA: Diagnosis not present

## 2018-08-05 DIAGNOSIS — G319 Degenerative disease of nervous system, unspecified: Secondary | ICD-10-CM

## 2018-08-05 DIAGNOSIS — M328 Other forms of systemic lupus erythematosus: Secondary | ICD-10-CM

## 2018-08-05 DIAGNOSIS — R269 Unspecified abnormalities of gait and mobility: Secondary | ICD-10-CM | POA: Diagnosis not present

## 2018-08-05 DIAGNOSIS — Z0000 Encounter for general adult medical examination without abnormal findings: Secondary | ICD-10-CM

## 2018-08-05 DIAGNOSIS — M329 Systemic lupus erythematosus, unspecified: Secondary | ICD-10-CM | POA: Diagnosis not present

## 2018-08-05 DIAGNOSIS — Z7251 High risk heterosexual behavior: Secondary | ICD-10-CM | POA: Diagnosis not present

## 2018-08-05 NOTE — Progress Notes (Signed)
Establish care-  STD check

## 2018-08-05 NOTE — Progress Notes (Signed)
Established Patient Office Visit  Subjective:  Patient ID: Erika Boyer, female    DOB: [birth year]  Age: 23 y.o. MRN: [id number]  CC: No chief complaint on file.   HPI Erika Boyer presents for STD testing unprotected sex. We discussed STD and birth controlled. PMI : Cerebellar degeneration of unknown etiology, multiple needs, post-viral cerebellar degeneration unstable gait and falling ,  Tremors. Past Medical History:  Diagnosis Date  . Anemia   . Anxiety   . Arthritis    "right knee" (10/11/2016)  . 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 Chronic bronchitis ([place])   . Daily headache   . Depression   . Graves disease 2012   hx; "was on RX; it disappeared" (10/11/2016)  . History of blood transfusion    "related to the anemia" (10/11/2016)  . Hypertension   . Lupus ([place])    "not sure what kind" (10/11/2016)  . Pneumonia 10/03/2016    Past Surgical History:  Procedure Laterality Date  . PERICARDIOCENTESIS N/A 10/04/2016   Procedure: Pericardiocentesis;  Surgeon: Adrian Prows, MD;  Location: [facility];  Service: Cardiovascular;  Laterality: N/A;  . REDUCTION MAMMAPLASTY Bilateral 2012  . TONSILLECTOMY      Family History  Problem Relation Age of Onset  . Thalassemia Mother   . Arthritis Mother   . Breast cancer Mother        Mastectomy in early 74's.    . High blood pressure Father   . Mental illness Father        question bipolar disorder per wife  . Sickle cell trait Maternal Aunt   . Breast cancer Maternal Aunt        4 maternal aunts w/breast cancer  . Breast cancer Maternal Grandmother        Bone  . Multiple sclerosis Cousin     Social History   Socioeconomic History  . Marital status: Single    Spouse name: Not on file  . Number of children: 0  . Years of education: 71  . Highest education level: Some college, no degree  Occupational History  . Not on file  Social Needs  . Financial resource strain: Not on file  . Food insecurity:   Worry: Not on file    Inability: Not on file  . Transportation needs:    Medical: Not on file    Non-medical: Not on file  Tobacco Use  . Smoking status: Never Smoker  . Smokeless tobacco: Never Used  Substance and Sexual Activity  . Alcohol use: No  . Drug use: No  . Sexual activity: Yes  Lifestyle  . Physical activity:    Days per week: Not on file    Minutes per session: Not on file  . Stress: Not on file  Relationships  . Social connections:    Talks on phone: Not on file    Gets together: Not on file    Attends religious service: Not on file    Active member of club or organization: Not on file    Attends meetings of clubs or organizations: Not on file    Relationship status: Not on file  . Intimate partner violence:    Fear of current or ex partner: Not on file    Emotionally abused: Not on file    Physically abused: Not on file    Forced sexual activity: Not on file  Other Topics Concern  . Not on file  Social History Narrative   Lives at  home w/ her mom   Right-handed   Caffeine: occasional coffee    Outpatient Medications Prior to Visit  Medication Sig Dispense Refill  . albuterol (PROAIR HFA) 108 (90 Base) MCG/ACT inhaler Inhale 2 puffs into the lungs every 4 (four) hours as needed for wheezing or shortness of breath.    . EPINEPHrine 0.3 mg/0.3 mL IJ SOAJ injection USE AS DIRECTED AND THEN CALL 911    . hydroxychloroquine (PLAQUENIL) 200 MG tablet TK 2 TS PO QD WF OR MILK  2  . predniSONE (DELTASONE) 1 MG tablet Take 1 mg by mouth daily with breakfast.    . Misc. Devices (WALL GRAB BAR) MISC 1 kit by Does not apply route daily. 1 each 0   No facility-administered medications prior to visit.     Allergies  Allergen Reactions  . Shellfish Allergy Anaphylaxis  . Morphine And Related Itching  . Mushroom Extract Complex Other (See Comments)    Mother is allergic, so patient avoids these  . Tapazole [Methimazole] Swelling and Rash    WELTS (also) Mother  suspects this MAY be an allergy, as the onset of symptoms coincided with this being started    ROS Review of Systems    Objective:    Physical Exam  BP 95/73   Pulse 74   Temp 97.9 F (36.6 C) (Oral)   Resp 16   Wt 269 lb (122 kg)   LMP 08/02/2018   SpO2 100%   BMI 44.76 kg/m  Wt Readings from Last 3 Encounters:  08/05/18 269 lb (122 kg)  07/02/18 265 lb (120.2 kg)  03/25/18 272 lb 12.8 oz (123.7 kg)     There are no preventive care reminders to display for this patient.  There are no preventive care reminders to display for this patient.  Lab Results  Component Value Date   TSH 1.11 06/27/2017   Lab Results  Component Value Date   WBC 4.5 11/04/2017   HGB 11.1 11/04/2017   HCT 36.1 11/04/2017   MCV 79 11/04/2017   PLT 279 11/04/2017   Lab Results  Component Value Date   NA 139 01/08/2017   K 3.9 01/08/2017   CO2 21 01/08/2017   GLUCOSE 81 01/08/2017   BUN 7 01/08/2017   CREATININE 0.70 01/08/2017   BILITOT 0.5 10/13/2016   ALKPHOS 39 10/13/2016   AST 12 (L) 10/13/2016   ALT 15 10/13/2016   PROT 5.8 (L) 10/13/2016   ALBUMIN 2.3 (L) 10/13/2016   CALCIUM 8.6 (L) 01/08/2017   ANIONGAP 9 10/24/2016   Lab Results  Component Value Date   CHOL 82 10/04/2016   No results found for: HDL No results found for: [facility] Lab Results  Component Value Date   TRIG 139 10/03/2016   No results found for: CHOLHDL Lab Results  Component Value Date   HGBA1C 5.3 10/13/2016      Assessment & Plan:   Problem List Items Addressed This Visit    Cerebral atrophy ([place])   SLE (systemic lupus erythematosus) (HCC)   Gait disorder - Primary    Other Visit Diagnoses    Screening for chlamydial disease       Morbid obesity ([place])          No orders of the defined types were placed in this encounter.   Follow-up: No follow-ups on file.    Kerin Perna, NP

## 2018-09-22 ENCOUNTER — Other Ambulatory Visit (INDEPENDENT_AMBULATORY_CARE_PROVIDER_SITE_OTHER): Payer: Self-pay | Admitting: Primary Care

## 2018-09-22 ENCOUNTER — Other Ambulatory Visit (HOSPITAL_COMMUNITY)
Admission: RE | Admit: 2018-09-22 | Discharge: 2018-09-22 | Disposition: A | Discharge status: Home / Self Care | Payer: Medicare Other | Source: Ambulatory Visit | Attending: Primary Care | Admitting: Primary Care

## 2018-09-22 DIAGNOSIS — Z118 Encounter for screening for other infectious and parasitic diseases: Secondary | ICD-10-CM | POA: Insufficient documentation

## 2018-09-24 LAB — URINE CYTOLOGY ANCILLARY ONLY
Bacterial vaginitis: POSITIVE — AB
CHLAMYDIA, DNA PROBE: NEGATIVE
Candida vaginitis: NEGATIVE
Neisseria Gonorrhea: NEGATIVE
Trichomonas: NEGATIVE

## 2018-09-25 ENCOUNTER — Other Ambulatory Visit: Payer: Self-pay | Admitting: Primary Care

## 2018-09-25 ENCOUNTER — Telehealth: Payer: Self-pay | Admitting: *Deleted

## 2018-09-25 MED ORDER — METRONIDAZOLE 500 MG PO TABS: 500.0000 mg | ORAL_TABLET | Freq: Two times a day (BID) | ORAL | 0 refills | Status: DC

## 2018-09-25 NOTE — Telephone Encounter (Signed)
Medical Assistant left message on patient's home and cell voicemail. Voicemail states to give a call back to [place] with [facility] at [phone]. Patient is aware of BV being present and needing to pick up medication from the [facility] for treatment.

## 2018-09-25 NOTE — Telephone Encounter (Signed)
-----   Message from Grayce Sessions, NP sent at 09/25/2018 12:51 PM EDT ----- BV results

## 2018-10-10 ENCOUNTER — Ambulatory Visit: Payer: Medicare Other | Admitting: Primary Care

## 2018-10-29 ENCOUNTER — Ambulatory Visit (INDEPENDENT_AMBULATORY_CARE_PROVIDER_SITE_OTHER): Payer: Medicare Other | Admitting: Primary Care

## 2018-10-30 DIAGNOSIS — G319 Degenerative disease of nervous system, unspecified: Secondary | ICD-10-CM | POA: Diagnosis not present

## 2018-10-30 DIAGNOSIS — J90 Pleural effusion, not elsewhere classified: Secondary | ICD-10-CM | POA: Diagnosis not present

## 2018-10-30 DIAGNOSIS — Z79899 Other long term (current) drug therapy: Secondary | ICD-10-CM | POA: Diagnosis not present

## 2018-10-30 DIAGNOSIS — I313 Pericardial effusion (noninflammatory): Secondary | ICD-10-CM | POA: Diagnosis not present

## 2018-10-30 DIAGNOSIS — M329 Systemic lupus erythematosus, unspecified: Secondary | ICD-10-CM | POA: Diagnosis not present

## 2018-12-01 ENCOUNTER — Ambulatory Visit: Payer: Medicare Other | Attending: Family Medicine | Admitting: Family Medicine

## 2018-12-01 ENCOUNTER — Other Ambulatory Visit: Payer: Self-pay

## 2018-12-01 ENCOUNTER — Encounter: Payer: Self-pay | Admitting: Family Medicine

## 2018-12-01 VITALS — BP 114/77 | HR 83 | Temp 98.6°F | Ht 65.0 in | Wt 285.6 lb

## 2018-12-01 DIAGNOSIS — Z6841 Body Mass Index (BMI) 40.0 and over, adult: Secondary | ICD-10-CM

## 2018-12-01 DIAGNOSIS — E049 Nontoxic goiter, unspecified: Secondary | ICD-10-CM | POA: Diagnosis not present

## 2018-12-01 DIAGNOSIS — M329 Systemic lupus erythematosus, unspecified: Secondary | ICD-10-CM | POA: Diagnosis present

## 2018-12-01 DIAGNOSIS — R21 Rash and other nonspecific skin eruption: Secondary | ICD-10-CM | POA: Diagnosis not present

## 2018-12-01 DIAGNOSIS — Z79899 Other long term (current) drug therapy: Secondary | ICD-10-CM

## 2018-12-01 DIAGNOSIS — I10 Essential (primary) hypertension: Secondary | ICD-10-CM | POA: Diagnosis not present

## 2018-12-01 DIAGNOSIS — Z885 Allergy status to narcotic agent status: Secondary | ICD-10-CM | POA: Diagnosis not present

## 2018-12-01 MED ORDER — PREDNISONE 1 MG PO TABS: 1.0000 mg | ORAL_TABLET | Freq: Every day | ORAL | 0 refills | Status: DC

## 2018-12-01 NOTE — Progress Notes (Signed)
Established Patient Office Visit  Subjective:  Patient ID: Erika Boyer, female    DOB: 14-Oct-1995  Age: 23 y.o. MRN: [id number]  CC:  Chief Complaint  Patient presents with  . New Patient (Initial Visit)    HPI Erika Boyer presents for follow-up of chronic medical issues.  Patient has been receiving primary care at [facility].  She reports that she is out of prednisone that she takes daily for treatment of her lupus.  She reports that she does have an appointment with her rheumatologist, Dr. Lenna Gilford but not until August.  Patient denies any issues at today's visit.  She denies any increased muscle or joint pain.  No rash or skin issues.  She states that overall she feels fine.  She denies any increased thirst or urinary frequency associated with her use of prednisone.  She reports no issues with chest pain or palpitations, no shortness of breath or cough.  No fever or chills.  No current headaches.  No abdominal pain-no nausea/vomiting/diarrhea or constipation.  Past Medical History:  Diagnosis Date  . Anemia   . Anxiety   . Arthritis    "right knee" (10/11/2016)  . 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 Chronic bronchitis ([place])   . Daily headache   . Depression   . Graves disease 2012   hx; "was on RX; it disappeared" (10/11/2016)  . History of blood transfusion    "related to the anemia" (10/11/2016)  . Hypertension   . Lupus ([place])    "not sure what kind" (10/11/2016)  . Pneumonia 10/03/2016    Past Surgical History:  Procedure Laterality Date  . PERICARDIOCENTESIS N/A 10/04/2016   Procedure: Pericardiocentesis;  Surgeon: Adrian Prows, MD;  Location: [facility];  Service: Cardiovascular;  Laterality: N/A;  . REDUCTION MAMMAPLASTY Bilateral 2012  . TONSILLECTOMY      Family History  Problem Relation Age of Onset  . Thalassemia Mother   . Arthritis Mother   . Breast cancer Mother        Mastectomy in early 14's.    . High blood pressure Father   .  Mental illness Father        question bipolar disorder per wife  . Sickle cell trait Maternal Aunt   . Breast cancer Maternal Aunt        4 maternal aunts w/breast cancer  . Breast cancer Maternal Grandmother        Bone  . Multiple sclerosis Cousin     Social History   Tobacco Use  . Smoking status: Never Smoker  . Smokeless tobacco: Never Used  Substance Use Topics  . Alcohol use: No  . Drug use: No    Outpatient Medications Prior to Visit  Medication Sig Dispense Refill  . albuterol (PROAIR HFA) 108 (90 Base) MCG/ACT inhaler Inhale 2 puffs into the lungs every 4 (four) hours as needed for wheezing or shortness of breath.    . EPINEPHrine 0.3 mg/0.3 mL IJ SOAJ injection USE AS DIRECTED AND THEN CALL 911    . hydroxychloroquine (PLAQUENIL) 200 MG tablet TK 2 TS PO QD WF OR MILK  2  . predniSONE (DELTASONE) 1 MG tablet Take 1 mg by mouth daily with breakfast.    . metroNIDAZOLE (FLAGYL) 500 MG tablet Take 1 tablet (500 mg total) by mouth 2 (two) times daily. (Patient not taking: Reported on 12/01/2018) 14 tablet 0  . Misc. Devices (WALL GRAB BAR) MISC 1 kit by Does not apply route  daily. (Patient not taking: Reported on 12/01/2018) 1 each 0   No facility-administered medications prior to visit.     Allergies  Allergen Reactions  . Shellfish Allergy Anaphylaxis  . Morphine And Related Itching  . Mushroom Extract Complex Other (See Comments)    Mother is allergic, so patient avoids these  . Tapazole [Methimazole] Swelling and Rash    WELTS (also) Mother suspects this MAY be an allergy, as the onset of symptoms coincided with this being started    ROS Review of Systems  Constitutional: Negative for chills, fatigue and fever.  HENT: Negative for sore throat and trouble swallowing.   Respiratory: Negative for cough and shortness of breath.   Cardiovascular: Negative for chest pain and palpitations.  Gastrointestinal: Negative for abdominal pain, constipation, diarrhea and  nausea.  Endocrine: Negative for cold intolerance, heat intolerance, polydipsia, polyphagia and polyuria.  Genitourinary: Negative for dysuria and frequency.  Musculoskeletal: Negative for arthralgias and back pain.  Skin: Negative for rash and wound.  Neurological: Negative for dizziness, numbness and headaches.  Hematological: Negative for adenopathy. Does not bruise/bleed easily.      Objective:    Physical Exam  Constitutional: She is oriented to person, place, and time. She appears well-developed and well-nourished.  Obese female in no acute distress  Neck: Normal range of motion. Neck supple. Thyromegaly present.  Cardiovascular: Normal rate and regular rhythm.  Pulmonary/Chest: Effort normal and breath sounds normal.  Abdominal: Soft. There is no abdominal tenderness. There is no rebound and no guarding.  Musculoskeletal:        General: No tenderness or edema.     Comments: No CVA tenderness  Lymphadenopathy:    She has no cervical adenopathy.  Neurological: She is alert and oriented to person, place, and time.  Skin: Rash noted. No erythema.  Patient appears to have a rash on the left cheek below the eye  Psychiatric: She has a normal mood and affect. Her behavior is normal.  Nursing note and vitals reviewed.   BP 114/77 (BP Location: Right Arm, Patient Position: Sitting, Cuff Size: Large)   Pulse 83   Temp 98.6 F (37 C) (Oral)   Ht 5' 5"  (1.651 m)   Wt 285 lb 9.6 oz (129.5 kg)   LMP 11/29/2018 (Exact Date)   SpO2 98%   BMI 47.53 kg/m  Wt Readings from Last 3 Encounters:  12/01/18 285 lb 9.6 oz (129.5 kg)  08/05/18 269 lb (122 kg)  07/02/18 265 lb (120.2 kg)      Lab Results  Component Value Date   TSH 1.11 06/27/2017   Lab Results  Component Value Date   WBC 4.5 11/04/2017   HGB 11.1 11/04/2017   HCT 36.1 11/04/2017   MCV 79 11/04/2017   PLT 279 11/04/2017   Lab Results  Component Value Date   NA 139 01/08/2017   K 3.9 01/08/2017   CO2 21  01/08/2017   GLUCOSE 81 01/08/2017   BUN 7 01/08/2017   CREATININE 0.70 01/08/2017   BILITOT 0.5 10/13/2016   ALKPHOS 39 10/13/2016   AST 12 (L) 10/13/2016   ALT 15 10/13/2016   PROT 5.8 (L) 10/13/2016   ALBUMIN 2.3 (L) 10/13/2016   CALCIUM 8.6 (L) 01/08/2017   ANIONGAP 9 10/24/2016   Lab Results  Component Value Date   CHOL 82 10/04/2016   No results found for: HDL No results found for: [facility] Lab Results  Component Value Date   TRIG 139 10/03/2016   No  results found for: [facility] Lab Results  Component Value Date   HGBA1C 5.3 10/13/2016      Assessment & Plan:  1. Systemic lupus erythematosus, unspecified SLE type, unspecified organ involvement status ([place]); 3.  Encounter for long-term use of high-risk medication It appears that patient may have last seen her rheumatologist in October 2019.  There is no official encounter note from rheumatology in the chart but mention of an appointment on this date.  Patient apparently has been on Plaquenil and daily prednisone.  Patient will likely need ophthalmology appointment due to her use of Plaquenil.  Patient reports that she has a rheumatology appointment in August and if she is still on Plaquenil then she will need to also be set up for ophthalmology follow-up.  Patient is provided with refill of prednisone 1 mg per her request.  Patient will have blood work in follow-up wof high risk medication use as well as to look for any electrolyte or liver abnormalities related to her use of Plaquenil as well as A1c in follow-up of her use of prednisone which can increase risk for diabetes.  Patient will also have CBC to check for blood disorders related to use of high-risk medication.  Patient will have sed rate in follow-up of her lupus.  Patient denies any issues with lupus at today's visit but she does appear an area of  facial rash. - CBC with Differential - Comprehensive metabolic panel - Hemoglobin A1c - predniSONE (DELTASONE) 1 MG  tablet; Take 1 tablet (1 mg total) by mouth daily with breakfast.  Dispense: 90 tablet; Refill: 0 - Ambulatory referral to Rheumatology - Sedimentation Rate  2. Goiter Patient with thyromegaly on exam and her problem list includes goiter.  Patient will have T4 and TSH in follow-up - T4 AND TSH  4. Morbid obesity with BMI of 45.0-49.9, adult [facility]) Patient with morbid obesity.  Healthy food choices and regular exercise encouraged.  Patient will also have CMP and hemoglobin A1c done in follow-up as patient with increased risk of diabetes and fatty liver related to obesity - Comprehensive metabolic panel - Hemoglobin A1c  An After Visit Summary was printed and given to the patient.  Follow-up: Return in about 3 months (around 03/03/2019) for chronic issues.   Antony Blackbird, MD

## 2018-12-01 NOTE — Progress Notes (Signed)
New patient and need med refills

## 2018-12-02 LAB — T4 AND TSH
T4, Total: 6.7 ug/dL (ref 4.5–12.0)
TSH: 1.11 u[IU]/mL (ref 0.450–4.500)

## 2018-12-02 LAB — CBC WITH DIFFERENTIAL/PLATELET
Basophils Absolute: 0 x10E3/uL (ref 0.0–0.2)
Basos: 0 %
EOS (ABSOLUTE): 0 x10E3/uL (ref 0.0–0.4)
Eos: 0 %
Hematocrit: 38 % (ref 34.0–46.6)
Hemoglobin: 11.7 g/dL (ref 11.1–15.9)
Immature Grans (Abs): 0 x10E3/uL (ref 0.0–0.1)
Immature Granulocytes: 0 %
Lymphocytes Absolute: 0.8 x10E3/uL (ref 0.7–3.1)
Lymphs: 22 %
MCH: 25.8 pg — ABNORMAL LOW (ref 26.6–33.0)
MCHC: 30.8 g/dL — ABNORMAL LOW (ref 31.5–35.7)
MCV: 84 fL (ref 79–97)
Monocytes Absolute: 0.4 x10E3/uL (ref 0.1–0.9)
Monocytes: 10 %
Neutrophils Absolute: 2.5 x10E3/uL (ref 1.4–7.0)
Neutrophils: 68 %
Platelets: 275 x10E3/uL (ref 150–450)
RBC: 4.53 x10E6/uL (ref 3.77–5.28)
RDW: 13.7 % (ref 11.7–15.4)
WBC: 3.7 x10E3/uL (ref 3.4–10.8)

## 2018-12-02 LAB — HEMOGLOBIN A1C
Est. average glucose Bld gHb Est-mCnc: 105 mg/dL
Hgb A1c MFr Bld: 5.3 % (ref 4.8–5.6)

## 2018-12-02 LAB — COMPREHENSIVE METABOLIC PANEL WITH GFR
ALT: 22 IU/L (ref 0–32)
AST: 23 IU/L (ref 0–40)
Albumin/Globulin Ratio: 1 — ABNORMAL LOW (ref 1.2–2.2)
Albumin: 3.7 g/dL — ABNORMAL LOW (ref 3.9–5.0)
Alkaline Phosphatase: 63 IU/L (ref 39–117)
BUN/Creatinine Ratio: 20 (ref 9–23)
BUN: 14 mg/dL (ref 6–20)
Bilirubin Total: 0.3 mg/dL (ref 0.0–1.2)
CO2: 20 mmol/L (ref 20–29)
Calcium: 8.7 mg/dL (ref 8.7–10.2)
Chloride: 106 mmol/L (ref 96–106)
Creatinine, Ser: 0.69 mg/dL (ref 0.57–1.00)
GFR calc Af Amer: 143 mL/min/1.73
GFR calc non Af Amer: 124 mL/min/1.73
Globulin, Total: 3.6 g/dL (ref 1.5–4.5)
Glucose: 83 mg/dL (ref 65–99)
Potassium: 3.8 mmol/L (ref 3.5–5.2)
Sodium: 143 mmol/L (ref 134–144)
Total Protein: 7.3 g/dL (ref 6.0–8.5)

## 2018-12-02 LAB — SEDIMENTATION RATE: Sed Rate: 72 mm/h — ABNORMAL HIGH (ref 0–32)

## 2018-12-08 ENCOUNTER — Other Ambulatory Visit: Payer: Self-pay

## 2018-12-08 ENCOUNTER — Ambulatory Visit (HOSPITAL_COMMUNITY)
Admission: EM | Admit: 2018-12-08 | Discharge: 2018-12-08 | Disposition: A | Discharge status: Home / Self Care | Payer: Medicare Other | Attending: Family Medicine | Admitting: Family Medicine

## 2018-12-08 ENCOUNTER — Encounter (HOSPITAL_COMMUNITY): Payer: Self-pay | Admitting: Emergency Medicine

## 2018-12-08 DIAGNOSIS — R102 Pelvic and perineal pain: Secondary | ICD-10-CM | POA: Diagnosis not present

## 2018-12-08 LAB — POCT PREGNANCY, URINE: Preg Test, Ur: NEGATIVE

## 2018-12-08 LAB — POCT URINALYSIS DIP (DEVICE)
Glucose, UA: NEGATIVE mg/dL
Ketones, ur: NEGATIVE mg/dL
Nitrite: NEGATIVE
Protein, ur: 100 mg/dL — AB
Specific Gravity, Urine: >=1.03 (ref 1.005–1.030)
Urobilinogen, UA: 0.2 mg/dL (ref 0.0–1.0)
pH: 6 (ref 5.0–8.0)

## 2018-12-08 MED ORDER — ACETAMINOPHEN 500 MG PO TABS: 500.0000 mg | ORAL_TABLET | Freq: Four times a day (QID) | ORAL | 0 refills | Status: DC | PRN

## 2018-12-08 NOTE — ED Provider Notes (Signed)
[place]    CSN: [id number] Arrival date & time: 12/08/18  1032      History   Chief Complaint Chief Complaint  Patient presents with  . Abdominal Pain    HPI Marvis Bakken is a 23 y.o. female.   Katharyn Schauer presents with complaints of pelvic cramping. Started approximately 5 days ago. Started while on her period, she initially thought it was related to her period, but her bleeding has since stopped, but cramping has persisted. States 2 weeks ago she had an episode of vaginal bleeding with cramping for 1 day. Last month her period was regular. LMP 7/11- 7/15. No fevers. States she has had some increased urination but no pain with urination, no blood in urine. Denies any vaginal discharge, itching, sores or lesions. She is sexually active, doesn't use condoms, is not on birth control. 1 previous pregnancy and elective abortion. She was treated for BV in May of this year. BM's have been regular, approximately every other day, last was two days ago. Denies any change to bowel habits. Hx of anemia, arthritis, graves disease, htn, lupus, cerebellar degeneration.    ROS per HPI, negative if not otherwise mentioned.      Past Medical History:  Diagnosis Date  . Anemia   . Anxiety   . Arthritis    "right knee" (10/11/2016)  . 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 Chronic bronchitis ([place])   . Daily headache   . Depression   . Graves disease 2012   hx; "was on RX; it disappeared" (10/11/2016)  . History of blood transfusion    "related to the anemia" (10/11/2016)  . Hypertension   . Lupus ([place])    "not sure what kind" (10/11/2016)  . Pneumonia 10/03/2016    Patient Active Problem List   Diagnosis Date Noted  . Headache 07/02/2018  . Anxiety 06/18/2017  . Cervical high risk HPV (human papillomavirus) test positive 03/20/2017  . 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 Medication refill 11/19/2016  . SLE (systemic lupus erythematosus) ([place]) 10/30/2016  . Cognitive disorder    . Debility 10/12/2016  . Cerebral atrophy ([place])   . MR (mental retardation)   . Drug-induced lupus erythematosus   . Tachycardia   . Hypokalemia   . Acute blood loss anemia   . Hyperthyroidism 10/03/2016  . Morbid obesity with BMI of 40.0-44.9, adult ([place]) 10/03/2016  . 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 Microcytic anemia 10/03/2016  . Cardiomegaly 10/03/2016  . Pleural effusion   . Pleural effusion, left   . Graves disease 08/23/2016    Past Surgical History:  Procedure Laterality Date  . PERICARDIOCENTESIS N/A 10/04/2016   Procedure: Pericardiocentesis;  Surgeon: Adrian Prows, MD;  Location: [facility];  Service: Cardiovascular;  Laterality: N/A;  . REDUCTION MAMMAPLASTY Bilateral 2012  . TONSILLECTOMY      OB History   No obstetric history on file.      Home Medications    Prior to Admission medications   Medication Sig Start Date End Date Taking? Authorizing Provider  acetaminophen (TYLENOL) 500 MG tablet Take 1 tablet (500 mg total) by mouth every 6 (six) hours as needed. 12/08/18   Zigmund Gottron, NP  albuterol (PROAIR HFA) 108 (90 Base) MCG/ACT inhaler Inhale 2 puffs into the lungs every 4 (four) hours as needed for wheezing or shortness of breath.    [provider]  EPINEPHrine 0.3 mg/0.3 mL IJ SOAJ injection USE AS DIRECTED AND THEN CALL 911 09/08/15  [provider]  hydroxychloroquine (PLAQUENIL) 200 MG tablet TK 2 TS PO QD WF OR MILK 01/23/17   [provider]  Misc. Devices (WALL GRAB BAR) MISC 1 kit by Does not apply route daily. Patient not taking: Reported on 12/01/2018 12/27/17   [name], PA-C  predniSONE (DELTASONE) 1 MG tablet Take 1 tablet (1 mg total) by mouth daily with breakfast. 12/01/18   Antony Blackbird, MD    Family History Family History  Problem Relation Age of Onset  . Thalassemia Mother   . Arthritis Mother   . Breast cancer Mother        Mastectomy in early 69's.    . High blood pressure  Father   . Mental illness Father        question bipolar disorder per wife  . Sickle cell trait Maternal Aunt   . Breast cancer Maternal Aunt        4 maternal aunts w/breast cancer  . Breast cancer Maternal Grandmother        Bone  . Multiple sclerosis Cousin     Social History Social History   Tobacco Use  . Smoking status: Never Smoker  . Smokeless tobacco: Never Used  Substance Use Topics  . Alcohol use: No  . Drug use: No     Allergies   Shellfish allergy, Morphine and related, Mushroom extract complex, and Tapazole [methimazole]   Review of Systems Review of Systems   Physical Exam Triage Vital Signs ED Triage Vitals [12/08/18 1157]  Enc Vitals Group     BP (!) 146/72     Pulse Rate 81     Resp 18     Temp 98.4 F (36.9 C)     Temp Source Oral     SpO2 99 %     Weight      Height      Head Circumference      Peak Flow      Pain Score 6     Pain Loc      Pain Edu?      Excl. in [place]?    No data found.  Updated Vital Signs BP (!) 146/72 (BP Location: Right Arm)   Pulse 81   Temp 98.4 F (36.9 C) (Oral)   Resp 18   LMP 11/29/2018 (Exact Date)   SpO2 99%    Physical Exam Constitutional:      General: She is not in acute distress.    Appearance: She is well-developed.  Cardiovascular:     Rate and Rhythm: Normal rate.  Pulmonary:     Effort: Pulmonary effort is normal.  Abdominal:     Tenderness: There is no abdominal tenderness. There is no right CVA tenderness, left CVA tenderness, guarding or rebound.  Genitourinary:    Labia:        Right: No lesion.        Left: No lesion.      Cervix: Friability present. No cervical motion tenderness or cervical bleeding.     Uterus: Normal. Not tender.      Adnexa: Right adnexa normal and left adnexa normal.       Right: No tenderness.       Comments: Moderate creamy white vaginal discharge noted; scant specks of brb, appears related to friable cervix  Skin:    General: Skin is warm and dry.   Neurological:     Mental Status: She is alert and oriented to person, place, and time.  UC Treatments / Results  Labs (all labs ordered are listed, but only abnormal results are displayed) Labs Reviewed  POCT URINALYSIS DIP (DEVICE) - Abnormal; Notable for the following components:      Result Value   Bilirubin Urine SMALL (*)    Hgb urine dipstick TRACE (*)    Protein, ur 100 (*)    Leukocytes,Ua TRACE (*)    All other components within normal limits  URINE CULTURE  POC URINE PREG, ED  POCT PREGNANCY, URINE  CERVICOVAGINAL ANCILLARY ONLY    EKG   Radiology No results found.  Procedures Procedures (including critical care time)  Medications Ordered in UC Medications - No data to display  Initial Impression / Assessment and Plan / UC Course  I have reviewed the triage vital signs and the nursing notes.  Pertinent labs & imaging results that were available during my care of the patient were reviewed by me and considered in my medical decision making (see chart for details).     ua with leuks and hgb, uncertain if this is dirty sample or related to UTI, culture collected and pending as with minimal urinary complaints. Vaginal cytology collected and pending. Negative pregnancy. No indications of acute surgical abdomen. Will notify of any positive findings and if any changes to treatment are needed.  Return precautions provided. If symptoms worsen or do not improve in the next week to return to be seen or to follow up with PCP.  Patient verbalized understanding and agreeable to plan.   Final Clinical Impressions(s) / UC Diagnoses   Final diagnoses:  Pelvic pain in female     Discharge Instructions     You urine is being cultured to confirm if any urinary tract infection present as your sample today is indeterminate.  We are testing your vaginal swab as well, as infectious sources can cause symptoms you are describing as well.  Will notify you of any positive  findings and if any changes to treatment are needed. You may monitor your results on your MyChart online as well.   Please withhold from intercourse for the next week. Please use condoms to prevent STD's.   If symptoms worsen or do not improve in the next week to return to be seen or to follow up with your PCP.     ED Prescriptions    Medication Sig Dispense Auth. Provider   acetaminophen (TYLENOL) 500 MG tablet Take 1 tablet (500 mg total) by mouth every 6 (six) hours as needed. 30 tablet Zigmund Gottron, NP     Controlled Substance Prescriptions [place] Controlled Substance Registry consulted? Not Applicable   Zigmund Gottron, NP 12/08/18 1241

## 2018-12-08 NOTE — ED Triage Notes (Signed)
Pt sts lower abd pain x 2 weeks

## 2018-12-08 NOTE — Discharge Instructions (Signed)
You urine is being cultured to confirm if any urinary tract infection present as your sample today is indeterminate.  We are testing your vaginal swab as well, as infectious sources can cause symptoms you are describing as well.  Will notify you of any positive findings and if any changes to treatment are needed. You may monitor your results on your MyChart online as well.   Please withhold from intercourse for the next week. Please use condoms to prevent STD's.   If symptoms worsen or do not improve in the next week to return to be seen or to follow up with your PCP.

## 2018-12-09 ENCOUNTER — Telehealth: Payer: Self-pay | Admitting: Emergency Medicine

## 2018-12-09 LAB — URINE CULTURE

## 2018-12-09 NOTE — Telephone Encounter (Signed)
Patient contacted via phone to be given results of labs.  Patient identified by name and date of birth.  Patient given results of labs.  Patient educated on lab results. Questions answered. Patient acknowledged understanding of labs results. 

## 2018-12-09 NOTE — Progress Notes (Deleted)
Patient ID: Erika Boyer, female   DOB: 1995/09/13, 23 y.o.   MRN: [id number] After being seen in the ED 12/08/2018 for pelvic pain.  From ED A/P: ua with leuks and hgb, uncertain if this is dirty sample or related to UTI, culture collected and pending as with minimal urinary complaints. Vaginal cytology collected and pending. Negative pregnancy. No indications of acute surgical abdomen. Will notify of any positive findings and if any changes to treatment are needed.  Return precautions provided. If symptoms worsen or do not improve in the next week to return to be seen or to follow up with PCP.  Patient verbalized understanding and agreeable to plan.

## 2018-12-10 ENCOUNTER — Ambulatory Visit: Payer: Medicare Other

## 2018-12-11 ENCOUNTER — Other Ambulatory Visit: Payer: Self-pay

## 2018-12-11 ENCOUNTER — Telehealth (HOSPITAL_COMMUNITY): Payer: Self-pay | Admitting: Emergency Medicine

## 2018-12-11 ENCOUNTER — Encounter (HOSPITAL_COMMUNITY): Payer: Self-pay | Admitting: Emergency Medicine

## 2018-12-11 ENCOUNTER — Ambulatory Visit (HOSPITAL_COMMUNITY)
Admission: EM | Admit: 2018-12-11 | Discharge: 2018-12-11 | Disposition: A | Discharge status: Home / Self Care | Payer: Medicare Other | Attending: Internal Medicine | Admitting: Internal Medicine

## 2018-12-11 DIAGNOSIS — A549 Gonococcal infection, unspecified: Secondary | ICD-10-CM

## 2018-12-11 LAB — CERVICOVAGINAL ANCILLARY ONLY
Bacterial vaginitis: POSITIVE — AB
Candida vaginitis: NEGATIVE
Chlamydia: NEGATIVE
Neisseria Gonorrhea: POSITIVE — AB
Trichomonas: NEGATIVE

## 2018-12-11 MED ORDER — METRONIDAZOLE 500 MG PO TABS: 500.0000 mg | ORAL_TABLET | Freq: Two times a day (BID) | ORAL | 0 refills | Status: AC

## 2018-12-11 MED ORDER — AZITHROMYCIN 250 MG PO TABS
1000.0000 mg | ORAL_TABLET | Freq: Once | ORAL | Status: AC
  Administered 2018-12-11: 1000 mg via ORAL

## 2018-12-11 MED ORDER — CEFTRIAXONE SODIUM 250 MG IJ SOLR
INTRAMUSCULAR | Status: AC
  Filled 2018-12-11: qty 250

## 2018-12-11 MED ORDER — CEFTRIAXONE SODIUM 250 MG IJ SOLR
250.0000 mg | Freq: Once | INTRAMUSCULAR | Status: AC
  Administered 2018-12-11: 20:00:00 250 mg via INTRAMUSCULAR

## 2018-12-11 MED ORDER — STERILE WATER FOR INJECTION IJ SOLN
INTRAMUSCULAR | Status: AC
  Filled 2018-12-11: qty 10

## 2018-12-11 MED ORDER — AZITHROMYCIN 250 MG PO TABS
ORAL_TABLET | ORAL | Status: AC
  Filled 2018-12-11: qty 4

## 2018-12-11 NOTE — ED Triage Notes (Signed)
Here for treatment of gonorrhea.

## 2018-12-11 NOTE — Telephone Encounter (Signed)
Pt returned call, will return to the office for treatment. All questions answered.

## 2018-12-11 NOTE — Telephone Encounter (Signed)
Gonorrhea is positive.  Patient should return as soon as possible to the urgent care for treatment with IM rocephin 250mg  and po zithromax 1g. Patient will not need to see a provider unless there are new symptoms she would like evaluated. Pt needs education to refrain from sexual intercourse for now and for 7 days after treatment to give the medicine time to work. Sexual partners need to be notified and tested/treated. Condoms may reduce risk of reinfection. [facility] notified.   Bacterial vaginosis is positive. This was not treated at the urgent care visit.  Flagyl 500 mg BID x 7 days #14 no refills sent to patients pharmacy of choice.    Attempted to reach patient. No answer at this time. Voicemail left.

## 2018-12-11 NOTE — Addendum Note (Signed)
Addended by: Sandria Manly on: 12/11/2018 03:15 PM   Modules accepted: Orders

## 2018-12-12 LAB — POCT PREGNANCY, URINE: Preg Test, Ur: NEGATIVE

## 2019-02-03 DIAGNOSIS — Z79899 Other long term (current) drug therapy: Secondary | ICD-10-CM | POA: Diagnosis not present

## 2019-02-03 DIAGNOSIS — I313 Pericardial effusion (noninflammatory): Secondary | ICD-10-CM | POA: Diagnosis not present

## 2019-02-03 DIAGNOSIS — G319 Degenerative disease of nervous system, unspecified: Secondary | ICD-10-CM | POA: Diagnosis not present

## 2019-02-03 DIAGNOSIS — J90 Pleural effusion, not elsewhere classified: Secondary | ICD-10-CM | POA: Diagnosis not present

## 2019-02-03 DIAGNOSIS — M329 Systemic lupus erythematosus, unspecified: Secondary | ICD-10-CM | POA: Diagnosis not present

## 2019-03-04 ENCOUNTER — Ambulatory Visit: Payer: Medicare Other | Admitting: Family Medicine

## 2019-03-05 DIAGNOSIS — R7989 Other specified abnormal findings of blood chemistry: Secondary | ICD-10-CM | POA: Diagnosis not present

## 2019-03-13 ENCOUNTER — Ambulatory Visit: Payer: Medicare Other | Attending: Family Medicine | Admitting: Family Medicine

## 2019-03-13 ENCOUNTER — Encounter: Payer: Self-pay | Admitting: Family Medicine

## 2019-03-13 ENCOUNTER — Other Ambulatory Visit: Payer: Self-pay

## 2019-03-13 VITALS — BP 112/77 | HR 83 | Temp 98.2°F | Ht 65.0 in | Wt 287.4 lb

## 2019-03-13 DIAGNOSIS — Z6841 Body Mass Index (BMI) 40.0 and over, adult: Secondary | ICD-10-CM

## 2019-03-13 DIAGNOSIS — R2689 Other abnormalities of gait and mobility: Secondary | ICD-10-CM

## 2019-03-13 DIAGNOSIS — G319 Degenerative disease of nervous system, unspecified: Secondary | ICD-10-CM | POA: Diagnosis not present

## 2019-03-13 DIAGNOSIS — M329 Systemic lupus erythematosus, unspecified: Secondary | ICD-10-CM

## 2019-03-13 NOTE — Progress Notes (Signed)
3 mo f/u   Chronic medical Issues  Per pt she needs a note

## 2019-03-13 NOTE — Progress Notes (Signed)
Established Patient Office Visit  Subjective:  Patient ID: Erika Boyer, female    DOB: 01-Jun-1995  Age: 23 y.o. MRN: [id number]  CC: No chief complaint on file.   HPI Erika Boyer, 23 year old female who is accompanied by her mother at today's visit, who reports issues with gait and balance and wonders if she would benefit from a wheeled walker.  Per mother, patient has had longstanding issues with balance issues and has had extensive physical therapy and is now able to walk without the use of an assistive device and mother is concerned that patient would be " going backwards" if she started using a wheeled walker. 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  She would like to have a wheeled walker to help with her balance as sometimes the sidewalk is uneven and she is afraid that she might fall.  She would like a walker with a seat as she sometimes gets tired/short of breath with activity. (Patient with history of pleural effusion that was possibly thought to be related to her lupus.).  Patient continues to follow-up with her rheumatologist, Dr. Lenna Gilford, in follow-up of her lupus and patient is currently on Plaquenil.        Patient otherwise feels stable at this time.  She does have some occasional mild joint pain.  She does have fatigue with activity.  Mother reports that patient also has issues with recall and has to use scat services or be supervised with travel as patient cannot reliably identify landmarks and is unable to remember directions.  Mother states that daughter was recently attempting to purchase tickets online to her favorite game show which was going to have an event here in [place] but mother thinks that patient was rerouted and mistakenly purchased tickets for the game show event being held in [place].  Mother states that  there is no way the patient would be able to travel independently and will need a letter to send to the travel company regarding patient's inability to travel independently so that patient can be refunded for the ticket price.  Past Medical History:  Diagnosis Date  . Anemia   . Anxiety   . Arthritis    "right knee" (10/11/2016)  . Cerebellar degeneration ([place])   . Chronic bronchitis ([place])   . Daily headache   . Depression   . Graves disease 2012   hx; "was on RX; it disappeared" (10/11/2016)  . History of blood transfusion    "related to the anemia" (10/11/2016)  . Hypertension   . Lupus ([place])    "not sure what kind" (10/11/2016)  . Pneumonia 10/03/2016    Past Surgical History:  Procedure Laterality Date  . PERICARDIOCENTESIS N/A 10/04/2016   Procedure: Pericardiocentesis;  Surgeon: Adrian Prows, MD;  Location: [facility];  Service: Cardiovascular;  Laterality: N/A;  . REDUCTION MAMMAPLASTY Bilateral 2012  . TONSILLECTOMY      Family History  Problem Relation Age of Onset  . Thalassemia Mother   . Arthritis Mother   . Breast cancer Mother        Mastectomy in early 102's.    . High blood pressure Father   . Mental illness Father        question bipolar disorder per wife  . Sickle cell trait Maternal Aunt   . Breast cancer Maternal Aunt  4 maternal aunts w/breast cancer  . Breast cancer Maternal Grandmother        Bone  . Multiple sclerosis Cousin     Social History   Socioeconomic History  . Marital status: Single    Spouse name: Not on file  . Number of children: 0  . Years of education: 31  . Highest education level: Some college, no degree  Occupational History  . Not on file  Social Needs  . Financial resource strain: Not on file  . Food insecurity    Worry: Not on file    Inability: Not on file  . Transportation needs    Medical: Not on file    Non-medical: Not on file  Tobacco Use  . Smoking status: Never Smoker  . Smokeless tobacco:  Never Used  Substance and Sexual Activity  . Alcohol use: No  . Drug use: No  . Sexual activity: Yes  Lifestyle  . Physical activity    Days per week: Not on file    Minutes per session: Not on file  . Stress: Not on file  Relationships  . Social Herbalist on phone: Not on file    Gets together: Not on file    Attends religious service: Not on file    Active member of club or organization: Not on file    Attends meetings of clubs or organizations: Not on file    Relationship status: Not on file  . Intimate partner violence    Fear of current or ex partner: Not on file    Emotionally abused: Not on file    Physically abused: Not on file    Forced sexual activity: Not on file  Other Topics Concern  . Not on file  Social History Narrative   Lives at home w/ her mom   Right-handed   Caffeine: occasional coffee    Outpatient Medications Prior to Visit  Medication Sig Dispense Refill  . acetaminophen (TYLENOL) 500 MG tablet Take 1 tablet (500 mg total) by mouth every 6 (six) hours as needed. 30 tablet 0  . albuterol (PROAIR HFA) 108 (90 Base) MCG/ACT inhaler Inhale 2 puffs into the lungs every 4 (four) hours as needed for wheezing or shortness of breath.    . EPINEPHrine 0.3 mg/0.3 mL IJ SOAJ injection USE AS DIRECTED AND THEN CALL 911    . hydroxychloroquine (PLAQUENIL) 200 MG tablet TK 2 TS PO QD WF OR MILK  2  . Misc. Devices (WALL GRAB BAR) MISC 1 kit by Does not apply route daily. 1 each 0  . predniSONE (DELTASONE) 1 MG tablet Take 1 tablet (1 mg total) by mouth daily with breakfast. (Patient not taking: Reported on 03/13/2019) 90 tablet 0   No facility-administered medications prior to visit.     Allergies  Allergen Reactions  . Shellfish Allergy Anaphylaxis  . Morphine And Related Itching  . Mushroom Extract Complex Other (See Comments)    Mother is allergic, so patient avoids these  . Tapazole [Methimazole] Swelling and Rash    WELTS (also) Mother  suspects this MAY be an allergy, as the onset of symptoms coincided with this being started    ROS Review of Systems  Constitutional: Positive for fatigue. Negative for chills and fever.  HENT: Negative for sore throat and trouble swallowing.   Respiratory: Negative for cough and shortness of breath.   Cardiovascular: Negative for chest pain, palpitations and leg swelling.  Gastrointestinal: Negative for abdominal pain,  constipation, diarrhea and nausea.  Endocrine: Negative for polydipsia, polyphagia and polyuria.  Genitourinary: Negative for dysuria and frequency.  Musculoskeletal: Positive for arthralgias and gait problem. Negative for back pain.  Neurological: Positive for dizziness and headaches.       Patient with longstanding issues with dizziness, balance and recurrent headaches per patient and mother.  Hematological: Negative for adenopathy. Does not bruise/bleed easily.  Psychiatric/Behavioral: Negative for self-injury and suicidal ideas. The patient is not nervous/anxious.       Objective:    Physical Exam  Constitutional: She is oriented to person, place, and time. She appears well-developed and well-nourished. No distress.  Well-nourished well-developed morbidly obese young adult female in no acute distress who is accompanied by her mother at today's visit  Neck: Normal range of motion. Neck supple. No JVD present.  Cardiovascular: Normal rate and regular rhythm.  Pulmonary/Chest: Effort normal and breath sounds normal.  Abdominal: Soft. There is no abdominal tenderness. There is no rebound and no guarding.  Truncal obesity  Musculoskeletal:        General: No edema.     Comments: Patient with decreased range of motion of the lower extremities related to obesity/body habitus  Lymphadenopathy:    She has no cervical adenopathy.  Neurological: She is alert and oriented to person, place, and time.  Skin: Skin is warm and dry.  Psychiatric: She has a normal mood and  affect. Her behavior is normal.  Nursing note and vitals reviewed.   BP 112/77 (BP Location: Left Arm, Patient Position: Sitting, Cuff Size: Large)   Pulse 83   Temp 98.2 F (36.8 C) (Oral)   Ht 5' 5"  (1.651 m)   Wt 287 lb 6.4 oz (130.4 kg)   LMP 02/27/2019 (Approximate)   SpO2 100%   BMI 47.83 kg/m  Wt Readings from Last 3 Encounters:  03/13/19 287 lb 6.4 oz (130.4 kg)  12/01/18 285 lb 9.6 oz (129.5 kg)  08/05/18 269 lb (122 kg)     Lab Results  Component Value Date   TSH 1.110 12/01/2018   Lab Results  Component Value Date   WBC 3.7 12/01/2018   HGB 11.7 12/01/2018   HCT 38.0 12/01/2018   MCV 84 12/01/2018   PLT 275 12/01/2018   Lab Results  Component Value Date   NA 143 12/01/2018   K 3.8 12/01/2018   CO2 20 12/01/2018   GLUCOSE 83 12/01/2018   BUN 14 12/01/2018   CREATININE 0.69 12/01/2018   BILITOT 0.3 12/01/2018   ALKPHOS 63 12/01/2018   AST 23 12/01/2018   ALT 22 12/01/2018   PROT 7.3 12/01/2018   ALBUMIN 3.7 (L) 12/01/2018   CALCIUM 8.7 12/01/2018   ANIONGAP 9 10/24/2016   Lab Results  Component Value Date   CHOL 82 10/04/2016   No results found for: HDL No results found for: [facility] Lab Results  Component Value Date   TRIG 139 10/03/2016   No results found for: [facility] Lab Results  Component Value Date   HGBA1C 5.3 12/01/2018     Assessment & Plan:  1. Systemic lupus erythematosus, unspecified SLE type, unspecified organ involvement status ([place]) Patient with lupus which causes patient to have issues with joint pain and mobility.  She continues to follow-up with rheumatology.  She will be referred to physical therapy for further evaluation and treatment to help with gait, balance and strengthening as this may also help to reduce joint pain - Ambulatory referral to Physical Therapy  2. Abnormality of  gait due to impairment of balance;3.  Cerebellar degeneration Patient and mother report the patient has had longstanding issues with gait  abnormality due to impairment of balance.  Patient with history of cerebellar degeneration of unknown cause.  Patient is unable to perform sudden movements and has difficulty walking on and even surfaces due to issues with her balance.  Discussed referral to physical therapy to help with gait and balance as well as for evaluation as to whether or not patient may benefit from the use of a wheeled walker.  Discussed with patient's mother that this may be a benefit for patient to have a wheeled walker as patient wishes to increase walking to help with her obesity/weight loss has reported that she will be walking along the sidewalk near her home and since the sidewalk may have cracks and uneven spaces, it may be beneficial for patient to use a wheeled walker to help prevent falls - Ambulatory referral to Physical Therapy  4. Morbid obesity with BMI of 45.0-49.9, adult [facility]) Discussed the need for dietary changes and increased physical activity/exercise to help with weight loss as patient is currently considered to be morbidly obese   An After Visit Summary was printed and given to the patient.  Follow-up: Return for chronic issues as needed and 5 months.  Antony Blackbird, MD

## 2019-03-19 ENCOUNTER — Ambulatory Visit: Payer: Medicare Other | Attending: Family Medicine | Admitting: Rehabilitation

## 2019-03-19 ENCOUNTER — Encounter: Payer: Self-pay | Admitting: Rehabilitation

## 2019-03-19 ENCOUNTER — Other Ambulatory Visit: Payer: Self-pay

## 2019-03-19 ENCOUNTER — Other Ambulatory Visit: Payer: Self-pay | Admitting: Family Medicine

## 2019-03-19 ENCOUNTER — Telehealth: Payer: Self-pay | Admitting: Rehabilitation

## 2019-03-19 DIAGNOSIS — R2689 Other abnormalities of gait and mobility: Secondary | ICD-10-CM | POA: Diagnosis not present

## 2019-03-19 DIAGNOSIS — R2681 Unsteadiness on feet: Secondary | ICD-10-CM | POA: Diagnosis not present

## 2019-03-19 DIAGNOSIS — M6281 Muscle weakness (generalized): Secondary | ICD-10-CM | POA: Diagnosis not present

## 2019-03-19 DIAGNOSIS — G319 Degenerative disease of nervous system, unspecified: Secondary | ICD-10-CM

## 2019-03-19 MED ORDER — BARIATRIC ROLLATOR MISC: 0 refills | Status: DC

## 2019-03-19 NOTE — Progress Notes (Unsigned)
Patient ID: Erika Boyer, female   DOB: 1995/12/06, 23 y.o.   MRN: [id number]    message received from physical therapist that patient would benefit from a rollator. Order will be placed

## 2019-03-19 NOTE — Therapy (Signed)
[facility] [address] [place], [place], [address] Phone: [phone]   Fax:  [phone]  Physical Therapy Evaluation  Patient Details  Name: Erika Boyer MRN: [id number] Date of Birth: 1995/07/04 Referring Provider (PT): Cain Saupe, MD   Encounter Date: 03/19/2019  PT End of Session - 03/19/19 1255    Visit Number  1    Number of Visits  17    Date for PT Re-Evaluation  05/18/19    Authorization Type  Medicare and Medicaid-10th visit progress note    PT Start Time  1017    PT Stop Time  1102    PT Time Calculation (min)  45 min    Activity Tolerance  Patient tolerated treatment well    Behavior During Therapy  Impulsive       Past Medical History:  Diagnosis Date  . Anemia   . Anxiety   . Arthritis    "right knee" (10/11/2016)  . 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 Chronic bronchitis (HCC)   . Daily headache   . Depression   . Graves disease 2012   hx; "was on RX; it disappeared" (10/11/2016)  . History of blood transfusion    "related to the anemia" (10/11/2016)  . Hypertension   . Lupus (HCC)    "not sure what kind" (10/11/2016)  . Pneumonia 10/03/2016    Past Surgical History:  Procedure Laterality Date  . PERICARDIOCENTESIS N/A 10/04/2016   Procedure: Pericardiocentesis;  Surgeon: Yates Decamp, MD;  Location: [facility] INVASIVE CV LAB;  Service: Cardiovascular;  Laterality: N/A;  . REDUCTION MAMMAPLASTY Bilateral 2012  . TONSILLECTOMY      There were no vitals filed for this visit.   Subjective Assessment - 03/19/19 1024    Subjective  "I want to get stronger.  I already go to the gym. I want to do the water therapy.  I am wanting to lose weight.  I also want to get a walker, because right now I like to walk, but I'm using a shopping cart and I don't like the way it makes me look."    Pertinent History  cerebellar degeneration, Lupus    Limitations  Walking    How long can you walk comfortably?  30  secs    Patient Stated Goals  "I want to get stronger."    Currently in Pain?  No/denies         [facility] PT Assessment - 03/19/19 1027      Assessment   Medical Diagnosis  gait/balance disorder    Referring Provider (PT)  Cain Saupe, MD    Onset Date/Surgical Date  --   Has been unsteady since she was 23 years old   Prior Therapy  Had PT when she was 27 and 23 years old       Precautions   Precautions  Fall      Balance Screen   Has the patient fallen in the past 6 months  No   several stumbles    Has the patient had a decrease in activity level because of a fear of falling?   No    Is the patient reluctant to leave their home because of a fear of falling?   No      Home Environment   Living Environment  Private residence    Living Arrangements  Alone    Available Help at Discharge  Family;Available PRN/intermittently    Type of Home  Apartment  Home Access  Stairs to enter    Entrance Stairs-Number of Steps  1    Entrance Stairs-Rails  None    Home Layout  One level    Home Equipment  Shower seat;Grab bars - tub/shower;Walker - 2 wheels      Prior Function   Level of Independence  Independent    Vocation  Unemployed    Leisure  "Chill"       Cognition   Overall Cognitive Status  Within Functional Limits for tasks assessed      Sensation   Light Touch  Impaired Detail    Light Touch Impaired Details  Impaired RLE;Impaired LLE   intermittently has N/T in legs that moves up to hips   Hot/Cold  Appears Intact      Coordination   Gross Motor Movements are Fluid and Coordinated  Yes    Fine Motor Movements are Fluid and Coordinated  Yes    Heel Shin Test  WFL      ROM / Strength   AROM / PROM / Strength  Strength      Strength   Overall Strength  Within functional limits for tasks performed    Overall Strength Comments  L hip flexion 3+/5, otherwise all 4/5 to 5/5      Transfers   Transfers  Sit to Stand;Stand to Sit    Sit to Stand  6: Modified independent  (Device/Increase time)    Five time sit to stand comments   12.62 secs without UE support, keeps weight on toes and has near LOB forward     Stand to Sit  6: Modified independent (Device/Increase time)      Ambulation/Gait   Ambulation/Gait  Yes    Ambulation/Gait Assistance  5: Supervision;4: Min assist    Ambulation/Gait Assistance Details  Pt ambulatory at S level into clinic, however when ambulating during assessment, has multiple episodes of veering R and L (more to the R), difficulty turning without having overt LOB.  Also note that she tends to get very close to objects around corners.  Needed min A most of the time when ambulating around therapy gym.     Ambulation Distance (Feet)  500 Feet    Assistive device  None    Gait Pattern  Step-through pattern;Trendelenburg;Lateral hip instability;Trunk flexed;Wide base of support   Pt with veering R and L during gait    Ambulation Surface  Level;Indoor    Gait velocity  3.96 ft/sec without AD    Stairs  Yes    Stairs Assistance  4: Min guard    Stair Management Technique  Alternating pattern;Two rails    Number of Stairs  4    Height of Stairs  6      Functional Gait  Assessment   Gait assessed   Yes    Gait Level Surface  Cannot walk 20 ft without assistance, severe gait deviations or imbalance deviates greater than 15 in outside of the 12 in walkway width or reaches and touches the wall   5.85 secs but with assist    Change in Gait Speed  Able to change speed, demonstrates mild gait deviations, deviates 6-10 in outside of the 12 in walkway width, or no gait deviations, unable to achieve a major change in velocity, or uses a change in velocity, or uses an assistive device.    Gait with Horizontal Head Turns  Performs head turns smoothly with slight change in gait velocity (eg, minor disruption to smooth  gait path), deviates 6-10 in outside 12 in walkway width, or uses an assistive device.    Gait with Vertical Head Turns  Performs task  with moderate change in gait velocity, slows down, deviates 10-15 in outside 12 in walkway width but recovers, can continue to walk.    Gait and Pivot Turn  Pivot turns safely in greater than 3 sec and stops with no loss of balance, or pivot turns safely within 3 sec and stops with mild imbalance, requires small steps to catch balance.    Step Over Obstacle  Is able to step over one shoe box (4.5 in total height) without changing gait speed. No evidence of imbalance.    Gait with Narrow Base of Support  Ambulates less than 4 steps heel to toe or cannot perform without assistance.    Gait with Eyes Closed  Walks 20 ft, slow speed, abnormal gait pattern, evidence for imbalance, deviates 10-15 in outside 12 in walkway width. Requires more than 9 sec to ambulate 20 ft.    Ambulating Backwards  Walks 20 ft, slow speed, abnormal gait pattern, evidence for imbalance, deviates 10-15 in outside 12 in walkway width.    Steps  Alternating feet, must use rail.    Total Score  13    FGA comment:  < 19 = high risk fall                Objective measurements completed on examination: See above findings.              PT Education - 03/19/19 1254    Education Details  Education on improved diet throughout the day to assist with weight loss.  Encouraged her to slow gait speed for safety at this time until balance improved.  Also encouraged her to decrease time ambulating in order to focus on balance HEP given during PT.  Educated on evaluation results, POC, goals.    Person(s) Educated  Patient    Methods  Explanation    Comprehension  Verbalized understanding       PT Short Term Goals - 03/19/19 1303      PT SHORT TERM GOAL #1   Title  Pt will be independent with initial HEP in order to indicate decreased fall risk and improved functional mobility (Target Date: 04/18/19)    Time  4    Period  Weeks    Status  New    Target Date  04/18/19      PT SHORT TERM GOAL #2   Title  Pt will  demonstrate safe use of rollator over indoor/outdoor surfaces x 1000' at mod I level in order to indicate safe community negotiation.      PT SHORT TERM GOAL #3   Title  Pt will improve FGA score to >/=16/30 in order to indicate decreased fall risk.      PT SHORT TERM GOAL #4   Title  Pt will report improved home routine to allow for better food choices along with more appropriate work out/walking schedule to lead to improved overall health.        PT Long Term Goals - 03/19/19 1306      PT LONG TERM GOAL #1   Title  Pt will be independent with home and pool HEP in order to indicate decreased fall risk and improved functional mobility. (Target Date: 05/18/19)    Time  8    Period  Weeks    Status  New    Target  Date  05/18/19      PT LONG TERM GOAL #2   Title  Pt will improve FGA to >/=19/30 in order to indicate decreased fall risk.      PT LONG TERM GOAL #3   Title  Pt will ambulate with gait speed >/=3.6 ft/sec with improved safety in order to indicate safe community ambulation.      PT LONG TERM GOAL #4   Title  Pt will ambulate >1000' w/ LRAD over varying outdoor surfaces while scanning environment with minimal veering, no overt LOB in order to indicate safe community negotation.             Plan - 03/19/19 1257    Clinical Impression Statement  Pt presents with history of Lupus and cerebellar degeneration (etilogy unknown) with reports of declining balance and endurance since being 23 years old.  Upon PT evaluation, note gait speed to be 3.96 ft/sec without AD, however requires min A during most gait in clinic due to intermittent veering R and L, requiring assist to avoid obstacles.  Also note that pt tends to keep weight towards toes causing forward LOB at times.  FGA is 13/30 indicative of high fall risk.  Pt also requests to do aquatic PT.  Discussed that she would benefit from 2-3 sessions in the pool to get a good HEP that she can continue at [facility] pool.  Pt verbalized  understanding.  Pt will benefit from skilled OP neuro in order to address deficits.    Personal Factors and Comorbidities  Comorbidity 3+    Comorbidities  see above, plus obesity    Examination-Activity Limitations  Lift;Locomotion Level;Squat;Stairs;Stand    Examination-Participation Restrictions  Community Activity;School    Stability/Clinical Decision Making  Evolving/Moderate complexity    Clinical Decision Making  Moderate    Rehab Potential  Good    PT Frequency  2x / week    PT Duration  8 weeks    PT Treatment/Interventions  ADLs/Self Care Home Management;Aquatic Therapy;DME Instruction;Gait training;Stair training;Functional mobility training;Therapeutic activities;Therapeutic exercise;Balance training;Neuromuscular re-education;Patient/family education;Energy conservation;Passive range of motion;Vestibular    PT Next Visit Plan  trial use of rollator (PT requested order from MD-see if order in EPIC), initiate HEP for balance, slower gait speed for improved safety    Consulted and Agree with Plan of Care  Patient       Patient will benefit from skilled therapeutic intervention in order to improve the following deficits and impairments:  Abnormal gait, Decreased activity tolerance, Decreased balance, Decreased endurance, Decreased knowledge of precautions, Decreased knowledge of use of DME, Decreased safety awareness, Difficulty walking, Impaired sensation, Improper body mechanics, Obesity  Visit Diagnosis: Unsteadiness on feet  Other abnormalities of gait and mobility  Muscle weakness (generalized)     Problem List Patient Active Problem List   Diagnosis Date Noted  . Headache 07/02/2018  . Anxiety 06/18/2017  . Cervical high risk HPV (human papillomavirus) test positive 03/20/2017  . 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 Medication refill 11/19/2016  . SLE (systemic lupus erythematosus) (HCC) 10/30/2016  . Cognitive disorder   . Debility 10/12/2016  . Cerebral atrophy (HCC)    . MR (mental retardation)   . Drug-induced lupus erythematosus   . Tachycardia   . Hypokalemia   . Acute blood loss anemia   . Hyperthyroidism 10/03/2016  . Morbid obesity with BMI of 40.0-44.9, adult (HCC) 10/03/2016  . 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 Microcytic anemia 10/03/2016  . Cardiomegaly 10/03/2016  . Pleural effusion   .  Pleural effusion, left   . Graves disease 08/23/2016    Harriet ButteEmily Kevona Lupinacci, PT, MPT [facility] [address] [place], [place], [address] Phone: [phone]   Fax:  [phone] 03/19/19, 1:10 PM  Name: Erika Boyer MRN: [id number] Date of Birth: 08-05-1995

## 2019-03-19 NOTE — Telephone Encounter (Signed)
I can place the order in Epic and you can open to print? If you mean something different let me know

## 2019-03-19 NOTE — Telephone Encounter (Signed)
Dr. Chapman Fitch,   I evaluated Erika Boyer today for OP neuro PT.  She would benefit from use of rollator walker.  Could you please write order for rollator walker in Epic work que and I will give to her at next visit??  Thanks, Cameron Sprang, PT, MPT [facility] [address] [place], [place], [address] Phone: [phone]   Fax:  [phone] 03/19/19, 1:12 PM

## 2019-03-30 ENCOUNTER — Other Ambulatory Visit: Payer: Self-pay

## 2019-03-30 ENCOUNTER — Ambulatory Visit: Payer: Medicare Other | Attending: Family Medicine | Admitting: Physical Therapy

## 2019-03-30 DIAGNOSIS — R2689 Other abnormalities of gait and mobility: Secondary | ICD-10-CM

## 2019-03-30 DIAGNOSIS — R2681 Unsteadiness on feet: Secondary | ICD-10-CM

## 2019-03-30 DIAGNOSIS — M6281 Muscle weakness (generalized): Secondary | ICD-10-CM

## 2019-03-31 ENCOUNTER — Encounter: Payer: Self-pay | Admitting: Physical Therapy

## 2019-03-31 NOTE — Therapy (Signed)
[facility] [address] [place], [place], [address] Phone: [phone]   Fax:  [phone]  Physical Therapy Treatment  Patient Details  Name: Erika FlatteryOmaure Boyer MRN: [id number] Date of Birth: 16-Nov-1995 Referring Provider (PT): Cain Saupeammie Fulp, MD   Encounter Date: 03/30/2019  PT End of Session - 03/31/19 2130    Visit Number  2    Number of Visits  17    Date for PT Re-Evaluation  05/18/19    Authorization Type  Medicare and Medicaid-10th visit progress note    PT Start Time  1557    PT Stop Time  1640    PT Time Calculation (min)  43 min    Equipment Utilized During Treatment  --   pool noodle used for UE support   Activity Tolerance  Patient tolerated treatment well    Behavior During Therapy  Impulsive       Past Medical History:  Diagnosis Date  . Anemia   . Anxiety   . Arthritis    "right knee" (10/11/2016)  . 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 Chronic bronchitis (HCC)   . Daily headache   . Depression   . Graves disease 2012   hx; "was on RX; it disappeared" (10/11/2016)  . History of blood transfusion    "related to the anemia" (10/11/2016)  . Hypertension   . Lupus (HCC)    "not sure what kind" (10/11/2016)  . Pneumonia 10/03/2016    Past Surgical History:  Procedure Laterality Date  . PERICARDIOCENTESIS N/A 10/04/2016   Procedure: Pericardiocentesis;  Surgeon: Yates DecampGanji, Jay, MD;  [facility];  Service: Cardiovascular;  Laterality: N/A;  . REDUCTION MAMMAPLASTY Bilateral 2012  . TONSILLECTOMY      There were no vitals filed for this visit.  Subjective Assessment - 03/31/19 2128    Subjective  Pt arrives 10" [facility] - pt amb. without device - unsteady gait noted    Patient is accompained by:  --   [facility]   Pertinent History  cerebellar degeneration, Lupus    Patient Stated Goals  "I want to get stronger."    Currently in  Pain?  No/denies       [facility] temp 87.6 degrees  Patient seen for aquatic therapy today.  Treatment took place in water 3.5-4 feet deep depending upon activity.  Pt entered  and exited the pool via ramp negotiation with use of both rails for support - CGA for safety.  Pt performed water walking - 6754m x 2 reps each - forward and sideways - with use of noodle for UE support with PT holding noodle For increased stability;  Pt had tendency to lose balance to right side - needed min assist on 2 occurrences for recovery  Pt performed marching in place 15 reps each leg; progressed to slowly marching across length of pool (5054m) with min assist with cues To perform slowly  Pt performed standing hip extension, abduction and flexion 15 reps each - no increased resistance used -  Squats x 10 reps with UE support on side of pool  Pt performed Ai Chi posture Enclosing - with pt braced against pool wall for assistance with balance due to decr. Trunk control - 10 reps  Pt performed bicycling LE's with use of noodle for flotation - supported by PT - 4054m across pool Hip abduction/adduction 2 sets 10  reps with pt in supine position  Pt requires buoyancy of water for support and for safety with balance activities with reduced fall risk in water than on land;  Pt requires viscosity of water For resistance for strengthening exercises                          PT Short Term Goals - 03/31/19 2134      PT SHORT TERM GOAL #1   Title  Pt will be independent with initial HEP in order to indicate decreased fall risk and improved functional mobility (Target Date: 04/18/19)    Time  4    Period  Weeks    Status  New    Target Date  04/18/19      PT SHORT TERM GOAL #2   Title  Pt will demonstrate safe use of rollator over indoor/outdoor surfaces x 1000' at mod I level in order to indicate safe community negotiation.      PT SHORT TERM GOAL #3   Title  Pt will  improve FGA score to >/=16/30 in order to indicate decreased fall risk.      PT SHORT TERM GOAL #4   Title  Pt will report improved home routine to allow for better food choices along with more appropriate work out/walking schedule to lead to improved overall health.        PT Long Term Goals - 03/31/19 2134      PT LONG TERM GOAL #1   Title  Pt will be independent with home and pool HEP in order to indicate decreased fall risk and improved functional mobility. (Target Date: 05/18/19)    Time  8    Period  Weeks    Status  New      PT LONG TERM GOAL #2   Title  Pt will improve FGA to >/=19/30 in order to indicate decreased fall risk.      PT LONG TERM GOAL #3   Title  Pt will ambulate with gait speed >/=3.6 ft/sec with improved safety in order to indicate safe community ambulation.      PT LONG TERM GOAL #4   Title  Pt will ambulate >1000' w/ LRAD over varying outdoor surfaces while scanning environment with minimal veering, no overt LOB in order to indicate safe community negotation.            Plan - 03/31/19 2131    Clinical Impression Statement  Pt had unsteadiness and ataxic gait noted with water walking in pool - pt tended to consistently look down at her feet during ambulation in pool, stating she was able to focus better than with looking straight ahead.  Pt c/o back pain during final 15" of session, appeared to be due to pt compensating in attempting to maintain balance in the water.  Pt also presents with decreased core stabilization as evidenced by LOB with Ai Chi postures.    Personal Factors and Comorbidities  Comorbidity 3+    Comorbidities  see above, plus obesity    Examination-Activity Limitations  Lift;Locomotion Level;Squat;Stairs;Stand    Examination-Participation Restrictions  Community Activity;School    Stability/Clinical Decision Making  Evolving/Moderate complexity    Rehab Potential  Good    PT Frequency  2x / week    PT Duration  8 weeks    PT  Treatment/Interventions  ADLs/Self Care Home Management;Aquatic Therapy;DME Instruction;Gait training;Stair training;Functional mobility training;Therapeutic activities;Therapeutic exercise;Balance training;Neuromuscular re-education;Patient/family education;Energy conservation;Passive range of motion;Vestibular  PT Next Visit Plan  trial use of rollator (PT requested order from MD-see if order in EPIC), initiate HEP for balance, slower gait speed for improved safety    Consulted and Agree with Plan of Care  Patient       Patient will benefit from skilled therapeutic intervention in order to improve the following deficits and impairments:  Abnormal gait, Decreased activity tolerance, Decreased balance, Decreased endurance, Decreased knowledge of precautions, Decreased knowledge of use of DME, Decreased safety awareness, Difficulty walking, Impaired sensation, Improper body mechanics, Obesity  Visit Diagnosis: Other abnormalities of gait and mobility  Unsteadiness on feet  Muscle weakness (generalized)     Problem List Patient Active Problem List   Diagnosis Date Noted  . Headache 07/02/2018  . Anxiety 06/18/2017  . Cervical high risk HPV (human papillomavirus) test positive 03/20/2017  . 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 Medication refill 11/19/2016  . SLE (systemic lupus erythematosus) (HCC) 10/30/2016  . Cognitive disorder   . Debility 10/12/2016  . Cerebral atrophy (HCC)   . MR (mental retardation)   . Drug-induced lupus erythematosus   . Tachycardia   . Hypokalemia   . Acute blood loss anemia   . Hyperthyroidism 10/03/2016  . Morbid obesity with BMI of 40.0-44.9, adult (HCC) 10/03/2016  . 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 Microcytic anemia 10/03/2016  . Cardiomegaly 10/03/2016  . Pleural effusion   . Pleural effusion, left   . Graves disease 08/23/2016    Kary Kos, PT 03/31/2019, 9:35 PM  [facility] [address] [place], [place], [address] Phone: [phone]   Fax:  [phone]  Name: Runa Whittingham MRN: [id number] Date of Birth: 03-07-1996

## 2019-04-03 ENCOUNTER — Encounter: Payer: Self-pay | Admitting: Physical Therapy

## 2019-04-03 ENCOUNTER — Ambulatory Visit: Payer: Medicare Other | Admitting: Physical Therapy

## 2019-04-03 ENCOUNTER — Other Ambulatory Visit: Payer: Self-pay

## 2019-04-03 DIAGNOSIS — R2689 Other abnormalities of gait and mobility: Secondary | ICD-10-CM

## 2019-04-03 DIAGNOSIS — M6281 Muscle weakness (generalized): Secondary | ICD-10-CM | POA: Diagnosis not present

## 2019-04-03 DIAGNOSIS — R2681 Unsteadiness on feet: Secondary | ICD-10-CM | POA: Diagnosis not present

## 2019-04-03 NOTE — Telephone Encounter (Signed)
I can read your encounter where you stated you would place the order. However when I click on it under orders it only pulls up the written words for the walker, it does not have you NPI number, date or any other data that insurance would require to be filed.  I looked under the orders tab in Epic and did not see it there as well.   Thank you,  Willow Ora, PTA, [place] [address], [place], [place] [address] [phone] 04/03/19, 8:47 AM

## 2019-04-03 NOTE — Patient Instructions (Signed)
Access Code: [email or web address]  URL: [email or web address]  Date: 04/03/2019  Prepared by: Willow Ora   Exercises Sit to Stand - 10 reps - 1 sets - 1x daily - 5x weekly Heel Toe Raises with Counter Support - 10 reps - 1 sets - 5 hold - 1x daily - 5x weekly Wide Stance with Counter Support - 3 reps - 1 sets - 30 hold - 1x daily - 5x weekly Wide Stance with Head Rotations and Unilateral Counter Support - 10 reps - 1 sets - 1x daily - 5x weekly Wide Stance with Head Nods and Unilateral Counter Support - 10 reps - 1 sets - 1x daily - 5x weekly

## 2019-04-03 NOTE — Telephone Encounter (Signed)
Dr. Chapman Fitch,  Yes, if you place it in Epic I can print it out and give it to her at today's appointment. She is scheduled to be here at 3:30 today. If not today, we will get it to her at her next appointment.  Thank you,  Willow Ora, PTA, [place] [address] [place], [place] [address] [phone] 04/03/19, 8:33 AM

## 2019-04-03 NOTE — Telephone Encounter (Signed)
I will print the RX and write in the additional information and have our RN case manager contact patient/mother to see if she wants the RX for the rollator mailed to her or sent to a specific medical supply provider

## 2019-04-03 NOTE — Telephone Encounter (Signed)
I have an encounter note open from 03/19/2019 with the order. Please see if you are able to print the RX for the walker from that note.

## 2019-04-04 NOTE — Telephone Encounter (Signed)
I was able to locate the order under her med list in Epic and print it from there. She was given the order yesterday at her session. Thank you so much for working so hard on this. Have a great weekend.   Willow Ora, PTA, [place] [address], [place], [place] [address] [phone] 04/04/19, 4:15 PM

## 2019-04-04 NOTE — Therapy (Signed)
[facility] [address] [place], [place], [address] Phone: [phone]   Fax:  [phone]  Physical Therapy Treatment  Patient Details  Name: Erika Boyer MRN: [id number] Date of Birth: 1996-01-11 Referring Provider (PT): Antony Blackbird, MD   Encounter Date: 04/03/2019  PT End of Session - 04/03/19 1537    Visit Number  3    Number of Visits  17    Date for PT Re-Evaluation  05/18/19    Authorization Type  Medicare and Medicaid-10th visit progress note    PT Start Time  1532    PT Stop Time  1614    PT Time Calculation (min)  42 min    Equipment Utilized During Treatment  Gait belt    Activity Tolerance  Patient tolerated treatment well    Behavior During Therapy  Impulsive;WFL for tasks assessed/performed       Past Medical History:  Diagnosis Date  . Anemia   . Anxiety   . Arthritis    "right knee" (10/11/2016)  . Cerebellar degeneration ([place])   . Chronic bronchitis ([place])   . Daily headache   . Depression   . Graves disease 2012   hx; "was on RX; it disappeared" (10/11/2016)  . History of blood transfusion    "related to the anemia" (10/11/2016)  . Hypertension   . Lupus ([place])    "not sure what kind" (10/11/2016)  . Pneumonia 10/03/2016    Past Surgical History:  Procedure Laterality Date  . PERICARDIOCENTESIS N/A 10/04/2016   Procedure: Pericardiocentesis;  Surgeon: Adrian Prows, MD;  Location: [facility];  Service: Cardiovascular;  Laterality: N/A;  . REDUCTION MAMMAPLASTY Bilateral 2012  . TONSILLECTOMY      There were no vitals filed for this visit.  Subjective Assessment - 04/03/19 1534    Subjective  Pt arrived for appt an hour and half early due to SCAT. No falls or pain to report.    Pertinent History  cerebellar degeneration, Lupus    Limitations  Walking    How long can you walk comfortably?  30 secs    Patient Stated Goals  "I want to get stronger."    Currently in Pain?   No/denies    Pain Score  0-No pain          [facility] Adult PT Treatment/Exercise - 04/03/19 1539      Transfers   Transfers  Sit to Stand;Stand to Sit    Sit to Stand  6: Modified independent (Device/Increase time)    Stand to Sit  6: Modified independent (Device/Increase time)      Ambulation/Gait   Ambulation/Gait  Yes    Ambulation/Gait Assistance  4: Min guard;4: Min assist    Ambulation/Gait Assistance Details  min assist needed x3 due to left side of rollator lifting up due to increased weight going through right side.; cues needed to slow down with gait and stay closer to rollator with gait.     Ambulation Distance (Feet)  115 Feet    Assistive device  None    Gait Pattern  Step-through pattern;Trendelenburg;Lateral hip instability;Trunk flexed;Wide base of support    Ambulation Surface  Level;Indoor    Gait Comments  gait without device into/out of clinic with min guard assist due to pt with unsteadiness, veering, excessive trunk/arm movements and fast gait speed. cues to slow down needed with all gait.        Issued the following to HEP today. Min guard assist  for balance. Cues on correct form and technique with ex's.  Access Code: [email or web address]  URL: [email or web address]  Date: 04/03/2019  Prepared by: Sallyanne KusterKathy Fadi Menter   Exercises Sit to Stand - 10 reps - 1 sets - 1x daily - 5x weekly Heel Toe Raises with Counter Support - 10 reps - 1 sets - 5 hold - 1x daily - 5x weekly Wide Stance with Counter Support - 3 reps - 1 sets - 30 hold - 1x daily - 5x weekly Wide Stance with Head Rotations and Unilateral Counter Support - 10 reps - 1 sets - 1x daily - 5x weekly Wide Stance with Head Nods and Unilateral Counter Support - 10 reps - 1 sets - 1x daily - 5x weekly      PT Education - 04/03/19 1637    Education Details  issued HEP today. gait/safety with rollator with gait- needs more work on this.    Person(s) Educated  Patient    Methods   Explanation;Demonstration;Verbal cues;Handout    Comprehension  Verbalized understanding;Returned demonstration;Tactile cues required;Need further instruction       PT Short Term Goals - 03/31/19 2134      PT SHORT TERM GOAL #1   Title  Pt will be independent with initial HEP in order to indicate decreased fall risk and improved functional mobility (Target Date: 04/18/19)    Time  4    Period  Weeks    Status  New    Target Date  04/18/19      PT SHORT TERM GOAL #2   Title  Pt will demonstrate safe use of rollator over indoor/outdoor surfaces x 1000' at mod I level in order to indicate safe community negotiation.      PT SHORT TERM GOAL #3   Title  Pt will improve FGA score to >/=16/30 in order to indicate decreased fall risk.      PT SHORT TERM GOAL #4   Title  Pt will report improved home routine to allow for better food choices along with more appropriate work out/walking schedule to lead to improved overall health.        PT Long Term Goals - 03/31/19 2134      PT LONG TERM GOAL #1   Title  Pt will be independent with home and pool HEP in order to indicate decreased fall risk and improved functional mobility. (Target Date: 05/18/19)    Time  8    Period  Weeks    Status  New      PT LONG TERM GOAL #2   Title  Pt will improve FGA to >/=19/30 in order to indicate decreased fall risk.      PT LONG TERM GOAL #3   Title  Pt will ambulate with gait speed >/=3.6 ft/sec with improved safety in order to indicate safe community ambulation.      PT LONG TERM GOAL #4   Title  Pt will ambulate >1000' w/ LRAD over varying outdoor surfaces while scanning environment with minimal veering, no overt LOB in order to indicate safe community negotation.            Plan - 04/03/19 1537    Clinical Impression Statement  Today's skilled session initiated gait training with rollator. Pt needs further work with this on barriers and outdoor surfaces. Also issued HEP to address balance  with no issues reported with performance in session. Pt give printed copy of MD order for rollator. The pt is progressing toward goals and should  benefit from continued PT to progress toward unmet goals.    Personal Factors and Comorbidities  Comorbidity 3+    Comorbidities  see above, plus obesity    Examination-Activity Limitations  Lift;Locomotion Level;Squat;Stairs;Stand    Examination-Participation Restrictions  Community Activity;School    Stability/Clinical Decision Making  Evolving/Moderate complexity    Rehab Potential  Good    PT Frequency  2x / week    PT Duration  8 weeks    PT Treatment/Interventions  ADLs/Self Care Home Management;Aquatic Therapy;DME Instruction;Gait training;Stair training;Functional mobility training;Therapeutic activities;Therapeutic exercise;Balance training;Neuromuscular re-education;Patient/family education;Energy conservation;Passive range of motion;Vestibular    PT Next Visit Plan  continue to work on use of rollator- uneven surfaces, barriers. ? need to weight left side down to keep it from lifting up due to excessive pressure pt places on right side; continue to work on balance. work on slowing pt down, emphasis on low amplitude with movements/gait    Consulted and Agree with Plan of Care  Patient       Patient will benefit from skilled therapeutic intervention in order to improve the following deficits and impairments:  Abnormal gait, Decreased activity tolerance, Decreased balance, Decreased endurance, Decreased knowledge of precautions, Decreased knowledge of use of DME, Decreased safety awareness, Difficulty walking, Impaired sensation, Improper body mechanics, Obesity  Visit Diagnosis: Other abnormalities of gait and mobility  Unsteadiness on feet     Problem List Patient Active Problem List   Diagnosis Date Noted  . Headache 07/02/2018  . Anxiety 06/18/2017  . Cervical high risk HPV (human papillomavirus) test positive 03/20/2017  . Gait  disorder 02/04/2017  . Medication refill 11/19/2016  . SLE (systemic lupus erythematosus) (HCC) 10/30/2016  . Cognitive disorder   . Debility 10/12/2016  . Cerebral atrophy ([facility])   . MR (mental retardation)   . Drug-induced lupus erythematosus   . Tachycardia   . Hypokalemia   . Acute blood loss anemia   . Hyperthyroidism 10/03/2016  . Morbid obesity with BMI of 40.0-44.9, adult (HCC) 10/03/2016  . 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 Microcytic anemia 10/03/2016  . Cardiomegaly 10/03/2016  . Pleural effusion   . Pleural effusion, left   . Graves disease 08/23/2016    Sallyanne Kuster, PTA, [facility] [address], [address] [place], [place] [address] [phone] 04/04/19, 4:44 PM   Name: Erika Boyer MRN: [id number] Date of Birth: 1996-01-06

## 2019-04-08 ENCOUNTER — Ambulatory Visit: Payer: Medicare Other | Admitting: Physical Therapy

## 2019-04-08 ENCOUNTER — Encounter: Payer: Self-pay | Admitting: Physical Therapy

## 2019-04-08 ENCOUNTER — Other Ambulatory Visit: Payer: Self-pay

## 2019-04-08 DIAGNOSIS — M6281 Muscle weakness (generalized): Secondary | ICD-10-CM | POA: Diagnosis not present

## 2019-04-08 DIAGNOSIS — R2681 Unsteadiness on feet: Secondary | ICD-10-CM | POA: Diagnosis not present

## 2019-04-08 DIAGNOSIS — R2689 Other abnormalities of gait and mobility: Secondary | ICD-10-CM

## 2019-04-08 NOTE — Therapy (Signed)
[facility] [address] [place], [place], [address] Phone: [phone]   Fax:  [phone]  Physical Therapy Treatment  Patient Details  Name: Erika Boyer MRN: [id number] Date of Birth: 09-Oct-1995 Referring Provider (PT): Antony Blackbird, MD   Encounter Date: 04/08/2019  PT End of Session - 04/08/19 1918    Visit Number  4    Number of Visits  17    Date for PT Re-Evaluation  05/18/19    Authorization Type  Medicare and Medicaid-10th visit progress note    PT Start Time  1545    PT Stop Time  1630    PT Time Calculation (min)  45 min    Equipment Utilized During Treatment  Gait belt    Activity Tolerance  Patient tolerated treatment well    Behavior During Therapy  Impulsive;WFL for tasks assessed/performed       Past Medical History:  Diagnosis Date  . Anemia   . Anxiety   . Arthritis    "right knee" (10/11/2016)  . 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 Chronic bronchitis ([place])   . Daily headache   . Depression   . Graves disease 2012   hx; "was on RX; it disappeared" (10/11/2016)  . History of blood transfusion    "related to the anemia" (10/11/2016)  . Hypertension   . Lupus ([place])    "not sure what kind" (10/11/2016)  . Pneumonia 10/03/2016    Past Surgical History:  Procedure Laterality Date  . PERICARDIOCENTESIS N/A 10/04/2016   Procedure: Pericardiocentesis;  Surgeon: Adrian Prows, MD;  Location: [facility];  Service: Cardiovascular;  Laterality: N/A;  . REDUCTION MAMMAPLASTY Bilateral 2012  . TONSILLECTOMY      There were no vitals filed for this visit.  Subjective Assessment - 04/08/19 1916    Subjective  Pt arrives to [facility] via [facility] for aquatic PT appt - pt approx. 4" early - states she is going to swim and exercise on her own some prior to scheduled 3:45 PT appt; pt using her new rollator which she states was delivered this am    Patient is accompained by:  Family member   mother    Pertinent History  cerebellar degeneration, Lupus    Limitations  Walking    How long can you walk comfortably?  30 secs    Patient Stated Goals  "I want to get stronger."    Currently in Pain?  No/denies       Aquatic therapy at [facility] - pool temp 87.6 degrees   Patient seen for aquatic therapy today.  Treatment took place in water 3.5-4 feet deep depending upon activity.  Pt entered and exited the  pool via step negotiation with use of hand rails step by step sequence with supervision.  Pt performed water walking 48m across pool x 2 reps each - forward, back and side stepping holding onto noodle stabilized by PT  Marching in place 10 reps each leg holding onto noodle; marching across pool with mod UE assist on noodle with cues for slow and controlled movement  Pt performed standing hip extension, abduction and flexion 15 reps each leg - no increased resistance used -  Squats x 10 reps with UE support on side of pool  Pt amb. Forward across pool holding bar bells for fascilitation of trunk stabilization - min assist for recovery of LOB as pt frequently fell toward Rt side   Pt performed trunk control exercise in seated position  on pool bench in water - no UE support used - pt performed bil. Knee extension/flexion 10 reps; alternate knee extension/flexion 10 reps each; seated marching 10 reps and contralateral UE flexion and Knee extension 5 sec hold 5 reps each side for improved trunk control  Pt performed Ai Chi posture Enclosing - with pt positioned near pool wall but did not use for assistance with balance - 10 reps  Pt performed bicycling LE's with use of noodle for flotation - supported by PT - 25m x 2 reps across pool Hip abduction/adduction 2 sets 10 reps with pt in supine position  Pt requires buoyancy of water for support and for safety with balance activities with reduced fall risk in water than on land;  Pt requires viscosity of water For resistance for strengthening  exercises                                                PT Short Term Goals - 04/08/19 1926      PT SHORT TERM GOAL #1   Title  Pt will be independent with initial HEP in order to indicate decreased fall risk and improved functional mobility (Target Date: 04/18/19)    Time  4    Period  Weeks    Status  New    Target Date  04/18/19      PT SHORT TERM GOAL #2   Title  Pt will demonstrate safe use of rollator over indoor/outdoor surfaces x 1000' at mod I level in order to indicate safe community negotiation.      PT SHORT TERM GOAL #3   Title  Pt will improve FGA score to >/=16/30 in order to indicate decreased fall risk.      PT SHORT TERM GOAL #4   Title  Pt will report improved home routine to allow for better food choices along with more appropriate work out/walking schedule to lead to improved overall health.        PT Long Term Goals - 04/08/19 1926      PT LONG TERM GOAL #1   Title  Pt will be independent with home and pool HEP in order to indicate decreased fall risk and improved functional mobility. (Target Date: 05/18/19)    Time  8    Period  Weeks    Status  New      PT LONG TERM GOAL #2   Title  Pt will improve FGA to >/=19/30 in order to indicate decreased fall risk.      PT LONG TERM GOAL #3   Title  Pt will ambulate with gait speed >/=3.6 ft/sec with improved safety in order to indicate safe community ambulation.      PT LONG TERM GOAL #4   Title  Pt will ambulate >1000' w/ LRAD over varying outdoor surfaces while scanning environment with minimal veering, no overt LOB in order to indicate safe community negotation.            Plan - 04/08/19 1919    Clinical Impression Statement  Aquatic therapy session focused on gait training and trunk control/core stabilization for reduced ataxia.  Pt 's balance improved today in pool compared to that in previous (initial session) on 03-30-19.  Water walker was not used  today with gait training in 4' water depth - noodle was used for UE support  and was stabilized by mod support from PT.  Pt continues to need frequent verbal cues for reduced speed with movements for increased control.    Personal Factors and Comorbidities  Comorbidity 3+    Comorbidities  see above, plus obesity    Examination-Activity Limitations  Lift;Locomotion Level;Squat;Stairs;Stand    Examination-Participation Restrictions  Community Activity;School    Stability/Clinical Decision Making  Evolving/Moderate complexity    Rehab Potential  Good    PT Frequency  2x / week    PT Duration  8 weeks    PT Treatment/Interventions  ADLs/Self Care Home Management;Aquatic Therapy;DME Instruction;Gait training;Stair training;Functional mobility training;Therapeutic activities;Therapeutic exercise;Balance training;Neuromuscular re-education;Patient/family education;Energy conservation;Passive range of motion;Vestibular    PT Next Visit Plan  continue to work on use of rollator- uneven surfaces, barriers. ? need to weight left side down to keep it from lifting up due to excessive pressure pt places on right side; continue to work on balance. work on slowing pt down, emphasis on low amplitude with movements/gait    Consulted and Agree with Plan of Care  Patient       Patient will benefit from skilled therapeutic intervention in order to improve the following deficits and impairments:  Abnormal gait, Decreased activity tolerance, Decreased balance, Decreased endurance, Decreased knowledge of precautions, Decreased knowledge of use of DME, Decreased safety awareness, Difficulty walking, Impaired sensation, Improper body mechanics, Obesity  Visit Diagnosis: Other abnormalities of gait and mobility  Unsteadiness on feet  Muscle weakness (generalized)     Problem List Patient Active Problem List   Diagnosis Date Noted  . Headache 07/02/2018  . Anxiety 06/18/2017  . Cervical high risk HPV (human  papillomavirus) test positive 03/20/2017  . 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 Medication refill 11/19/2016  . SLE (systemic lupus erythematosus) (HCC) 10/30/2016  . Cognitive disorder   . Debility 10/12/2016  . Cerebral atrophy ([facility])   . MR (mental retardation)   . Drug-induced lupus erythematosus   . Tachycardia   . Hypokalemia   . Acute blood loss anemia   . Hyperthyroidism 10/03/2016  . Morbid obesity with BMI of 40.0-44.9, adult (HCC) 10/03/2016  . 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 Microcytic anemia 10/03/2016  . Cardiomegaly 10/03/2016  . Pleural effusion   . Pleural effusion, left   . Graves disease 08/23/2016    DildayDonavan Burnet, Yaroslav Gombos Suzanne, PT, [facility] 04/08/2019, 7:27 PM  [facility] [address] [place], [place], [address] Phone: [phone]   Fax:  [phone]  Name: Erika Boyer MRN: [id number] Date of Birth: April 02, 1996

## 2019-04-09 ENCOUNTER — Encounter: Payer: Self-pay | Admitting: Physical Therapy

## 2019-04-09 ENCOUNTER — Ambulatory Visit: Payer: Medicare Other

## 2019-04-09 ENCOUNTER — Other Ambulatory Visit: Payer: Self-pay

## 2019-04-09 ENCOUNTER — Ambulatory Visit: Payer: Medicare Other | Admitting: Physical Therapy

## 2019-04-09 DIAGNOSIS — M6281 Muscle weakness (generalized): Secondary | ICD-10-CM | POA: Diagnosis not present

## 2019-04-09 DIAGNOSIS — R2689 Other abnormalities of gait and mobility: Secondary | ICD-10-CM

## 2019-04-09 DIAGNOSIS — R2681 Unsteadiness on feet: Secondary | ICD-10-CM | POA: Diagnosis not present

## 2019-04-10 ENCOUNTER — Ambulatory Visit: Payer: Medicare Other

## 2019-04-10 NOTE — Therapy (Signed)
[facility] [address] [place], [place], [address] Phone: [phone]   Fax:  [phone]  Physical Therapy Treatment  Patient Details  Name: Erika Boyer MRN: [id number] Date of Birth: September 17, 1995 Referring Provider (PT): Antony Blackbird, MD   Encounter Date: 04/09/2019  PT End of Session - 04/09/19 1406    Visit Number  5    Number of Visits  17    Date for PT Re-Evaluation  05/18/19    Authorization Type  Medicare and Medicaid-10th visit progress note    PT Start Time  1402    PT Stop Time  1445    PT Time Calculation (min)  43 min    Equipment Utilized During Treatment  Gait belt    Activity Tolerance  Patient tolerated treatment well    Behavior During Therapy  Impulsive;WFL for tasks assessed/performed       Past Medical History:  Diagnosis Date  . Anemia   . Anxiety   . Arthritis    "right knee" (10/11/2016)  . Cerebellar degeneration ([facility])   . Chronic bronchitis ([facility])   . Daily headache   . Depression   . Graves disease 2012   hx; "was on RX; it disappeared" (10/11/2016)  . History of blood transfusion    "related to the anemia" (10/11/2016)  . Hypertension   . Lupus ([facility])    "not sure what kind" (10/11/2016)  . Pneumonia 10/03/2016    Past Surgical History:  Procedure Laterality Date  . PERICARDIOCENTESIS N/A 10/04/2016   Procedure: Pericardiocentesis;  Surgeon: Adrian Prows, MD;  Location: [facility];  Service: Cardiovascular;  Laterality: N/A;  . REDUCTION MAMMAPLASTY Bilateral 2012  . TONSILLECTOMY      There were no vitals filed for this visit.  Subjective Assessment - 04/09/19 1405    Subjective  No new complaints. Does have her new bariatric rollator. No falls or pain to report.    Pertinent History  cerebellar degeneration, Lupus    Limitations  Walking    How long can you walk comfortably?  30 secs    Patient Stated Goals  "I want to get stronger."    Currently in Pain?   No/denies    Pain Score  0-No pain          [facility] Adult PT Treatment/Exercise - 04/09/19 1407      Transfers   Transfers  Sit to Stand;Stand to Sit    Sit to Stand  6: Modified independent (Device/Increase time)    Stand to Sit  6: Modified independent (Device/Increase time)      Ambulation/Gait   Ambulation/Gait  Yes    Ambulation/Gait Assistance  4: Min guard    Ambulation/Gait Assistance Details  cues for hand placement on rollator breaks for safety with gait, to slow down with gait for safety and for rollator position with gait as pt tends to push it too far forward. Then the pt would bring it too close so that her legs hit the seat with advancement. Worked on proper positioning with gait on both indoor surfaces and outdoor surfaces.      Ambulation Distance (Feet)  200 Feet   x1 in/out door, plus around gym with activity   Assistive device  Rollator    Gait Pattern  Step-through pattern;Trendelenburg;Lateral hip instability;Trunk flexed;Wide base of support;Ataxic    Ambulation Surface  Level;Indoor;Unlevel;Outdoor;Paved      Neuro Re-ed    Neuro Re-ed Details   for strenthening/muscle re-ed/coordination: tall  kneeling with intermittent UE support on kaye bench- alternating UE raises for 8 reps each side, then mini squats with UE support progressing to no UE support for 15-18 reps total, cues to stay with upright posture;  quadruped without ball support for alternating UE raises for 10 reps each side with min assist for stability, attempted LE raises with mod/max assist needed for balance/stability, then with red pball support under abdomen tried LE raises again with min/mod assist needed for balance/stability;  standing with sturdy chair in front of pt- standing on 1 inch foam with feet apart, progressing to feet together for EC no head movements, back to feet apart for EC head movements left<>right, then up<>down. min guard to min assist for balance.                  PT  Short Term Goals - 04/08/19 1926      PT SHORT TERM GOAL #1   Title  Pt will be independent with initial HEP in order to indicate decreased fall risk and improved functional mobility (Target Date: 04/18/19)    Time  4    Period  Weeks    Status  New    Target Date  04/18/19      PT SHORT TERM GOAL #2   Title  Pt will demonstrate safe use of rollator over indoor/outdoor surfaces x 1000' at mod I level in order to indicate safe community negotiation.      PT SHORT TERM GOAL #3   Title  Pt will improve FGA score to >/=16/30 in order to indicate decreased fall risk.      PT SHORT TERM GOAL #4   Title  Pt will report improved home routine to allow for better food choices along with more appropriate work out/walking schedule to lead to improved overall health.        PT Long Term Goals - 04/08/19 1926      PT LONG TERM GOAL #1   Title  Pt will be independent with home and pool HEP in order to indicate decreased fall risk and improved functional mobility. (Target Date: 05/18/19)    Time  8    Period  Weeks    Status  New      PT LONG TERM GOAL #2   Title  Pt will improve FGA to >/=19/30 in order to indicate decreased fall risk.      PT LONG TERM GOAL #3   Title  Pt will ambulate with gait speed >/=3.6 ft/sec with improved safety in order to indicate safe community ambulation.      PT LONG TERM GOAL #4   Title  Pt will ambulate >1000' w/ LRAD over varying outdoor surfaces while scanning environment with minimal veering, no overt LOB in order to indicate safe community negotation.            Plan - 04/09/19 1406    Clinical Impression Statement  Today's skilled session focued on rollator training with pt's new bariatric rollator. No issues of left side coming up due to uneven weight through arms noted with this rollator. Pt does continue to need cues to slow down with gait and for general rollator safety/use with gait. Remainder of session continued to address strengthening and  balance reactions. The pt is progressing toward goals and should benefit from continued PT to progress toward unmet goals.    Personal Factors and Comorbidities  Comorbidity 3+    Comorbidities  see above, plus obesity  Examination-Activity Limitations  Lift;Locomotion Level;Squat;Stairs;Stand    Examination-Participation Restrictions  Community Activity;School    Stability/Clinical Decision Making  Evolving/Moderate complexity    Rehab Potential  Good    PT Frequency  2x / week    PT Duration  8 weeks    PT Treatment/Interventions  ADLs/Self Care Home Management;Aquatic Therapy;DME Instruction;Gait training;Stair training;Functional mobility training;Therapeutic activities;Therapeutic exercise;Balance training;Neuromuscular re-education;Patient/family education;Energy conservation;Passive range of motion;Vestibular    PT Next Visit Plan  continue to work on use of rollator- ramps and curb training; continue to work on balance. work on slowing pt down, emphasis on low amplitude with movements/gait    Consulted and Agree with Plan of Care  Patient       Patient will benefit from skilled therapeutic intervention in order to improve the following deficits and impairments:  Abnormal gait, Decreased activity tolerance, Decreased balance, Decreased endurance, Decreased knowledge of precautions, Decreased knowledge of use of DME, Decreased safety awareness, Difficulty walking, Impaired sensation, Improper body mechanics, Obesity  Visit Diagnosis: Other abnormalities of gait and mobility  Unsteadiness on feet  Muscle weakness (generalized)     Problem List Patient Active Problem List   Diagnosis Date Noted  . Headache 07/02/2018  . Anxiety 06/18/2017  . Cervical high risk HPV (human papillomavirus) test positive 03/20/2017  . 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 Medication refill 11/19/2016  . SLE (systemic lupus erythematosus) ([facility]) 10/30/2016  . Cognitive disorder   . Debility 10/12/2016  .  Cerebral atrophy ([facility])   . MR (mental retardation)   . Drug-induced lupus erythematosus   . Tachycardia   . Hypokalemia   . Acute blood loss anemia   . Hyperthyroidism 10/03/2016  . Morbid obesity with BMI of 40.0-44.9, adult ([facility]) 10/03/2016  . 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 Microcytic anemia 10/03/2016  . Cardiomegaly 10/03/2016  . Pleural effusion   . Pleural effusion, left   . Graves disease 08/23/2016    Sallyanne Kuster, PTA, [facility] [address] [place], [place] [address] [phone] 04/10/19, 7:16 PM   Name: Yarelly Kuba MRN: [id number] Date of Birth: 1996/04/23

## 2019-04-13 ENCOUNTER — Other Ambulatory Visit: Payer: Self-pay

## 2019-04-13 ENCOUNTER — Encounter: Payer: Self-pay | Admitting: Physical Therapy

## 2019-04-13 ENCOUNTER — Ambulatory Visit: Payer: Medicare Other | Admitting: Physical Therapy

## 2019-04-13 DIAGNOSIS — R2681 Unsteadiness on feet: Secondary | ICD-10-CM

## 2019-04-13 DIAGNOSIS — M6281 Muscle weakness (generalized): Secondary | ICD-10-CM

## 2019-04-13 DIAGNOSIS — R2689 Other abnormalities of gait and mobility: Secondary | ICD-10-CM

## 2019-04-13 NOTE — Therapy (Signed)
[facility] [address] [place], [place], [address] Phone: [phone]   Fax:  [phone]  Physical Therapy Treatment  Patient Details  Name: Erika Boyer MRN: [id number] Date of Birth: 01-16-1996 Referring Provider (PT): Cain Saupeammie Fulp, MD   Encounter Date: 04/13/2019  PT End of Session - 04/13/19 2102    Visit Number  6    Number of Visits  17    Date for PT Re-Evaluation  05/18/19    Authorization Type  Medicare and Medicaid-10th visit progress note    PT Start Time  1505    PT Stop Time  1550    PT Time Calculation (min)  45 min    Equipment Utilized During Treatment  Gait belt   ankle aquatic cuffs; pool noodle   Activity Tolerance  Patient tolerated treatment well    Behavior During Therapy  Impulsive;WFL for tasks assessed/performed       Past Medical History:  Diagnosis Date  . Anemia   . Anxiety   . Arthritis    "right knee" (10/11/2016)  . 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 Chronic bronchitis ([facility])   . Daily headache   . Depression   . Graves disease 2012   hx; "was on RX; it disappeared" (10/11/2016)  . History of blood transfusion    "related to the anemia" (10/11/2016)  . Hypertension   . Lupus ([facility])    "not sure what kind" (10/11/2016)  . Pneumonia 10/03/2016    Past Surgical History:  Procedure Laterality Date  . PERICARDIOCENTESIS N/A 10/04/2016   Procedure: Pericardiocentesis;  Surgeon: Yates DecampGanji, Jay, MD;  [facility];  Service: Cardiovascular;  Laterality: N/A;  . REDUCTION MAMMAPLASTY Bilateral 2012  . TONSILLECTOMY      There were no vitals filed for this visit.  Subjective Assessment - 04/13/19 2101    Subjective  [facility] is using rollator for assistance with ambulation    Patient is accompained by:  Family member   mother   Pertinent History  cerebellar degeneration, Lupus    Limitations  Walking    How long can you walk  comfortably?  30 secs    Patient Stated Goals  "I want to get stronger."    Currently in Pain?  No/denies         [facility] temp 87.2 degrees   Patient seen for aquatic therapy today.  Treatment took place in water 3.5-4 feet deep depending upon activity.  Pt entered and exited the  pool via ramp negotiation with use of hand rails with supervision.  Pt performed water walking 7054m across pool x 1 rep each - forward, back and side stepping holding onto noodle stabilized by PT Forward walking with pt holding bar bells for improved trunk stabilization with buoyancy and support of bar bells   Marching in place 10 reps each leg holding onto noodle  Pt performed standing hip extension, abduction and flexion 15 reps each leg with ankle cuff on each leg for incr. Resistance with eccentric contraction  Squats x 10 reps with UE support on side of pool  Pt performed Ai Chi postures -  Enclosing - with pt positioned near pool wall but did not use for assistance with balance - 10 reps - no LOB noted today with this exercise Pt performed balancing - lifting 1 leg in flexion, contralateral leg in extension 5 reps each side with CGA to min  assist for recovery of LOB  Pt performed bicycling LE's with use of noodle for flotation - supported by PT - 42m x 2 reps across pool Hip abduction/adduction 3 sets 10 reps with pt in supine position  Pt swam 4 laps - 70m x 4- with use of pool noodle for assistance with flotation at end of session  Pt requires buoyancy of water for support and for safety with balance activities with reduced fall risk in water than on land;  Pt requires viscosity of water For resistance for strengthening exercises                             PT Short Term Goals - 04/13/19 2126      PT SHORT TERM GOAL #1   Title  Pt will be independent with initial HEP in order to indicate decreased fall risk and improved functional mobility (Target  Date: 04/18/19)    Time  4    Period  Weeks    Status  New    Target Date  04/18/19      PT SHORT TERM GOAL #2   Title  Pt will demonstrate safe use of rollator over indoor/outdoor surfaces x 1000' at mod I level in order to indicate safe community negotiation.      PT SHORT TERM GOAL #3   Title  Pt will improve FGA score to >/=16/30 in order to indicate decreased fall risk.      PT SHORT TERM GOAL #4   Title  Pt will report improved home routine to allow for better food choices along with more appropriate work out/walking schedule to lead to improved overall health.        PT Long Term Goals - 04/13/19 2126      PT LONG TERM GOAL #1   Title  Pt will be independent with home and pool HEP in order to indicate decreased fall risk and improved functional mobility. (Target Date: 05/18/19)    Time  8    Period  Weeks    Status  New      PT LONG TERM GOAL #2   Title  Pt will improve FGA to >/=19/30 in order to indicate decreased fall risk.      PT LONG TERM GOAL #3   Title  Pt will ambulate with gait speed >/=3.6 ft/sec with improved safety in order to indicate safe community ambulation.      PT LONG TERM GOAL #4   Title  Pt will ambulate >1000' w/ LRAD over varying outdoor surfaces while scanning environment with minimal veering, no overt LOB in order to indicate safe community negotation.            Plan - 04/13/19 2103    Clinical Impression Statement  Pt continues to have difficulty maintaining balance in pool with slow marching due to decreased trunk control and core stabilization and decreased SLS.  Pt's gait improved with UE support on pool noodle with improved upright posture and trunk stability.  Pt had difficulty with plyometric exercise of cross country skiing (alternate lunges) - pt reported Rt toes did not completely clear pool floor and resulted in flexion.  Pt is progressing well with aquatic exercise - pt swam 4 laps (65m x 4) with use of pool noodle for flotation  at end of session.    Personal Factors and Comorbidities  Comorbidity 3+    Comorbidities  see above, plus obesity  Examination-Activity Limitations  Lift;Locomotion Level;Squat;Stairs;Stand    Examination-Participation Restrictions  Community Activity;School    Stability/Clinical Decision Making  Evolving/Moderate complexity    Rehab Potential  Good    PT Frequency  2x / week    PT Duration  8 weeks    PT Treatment/Interventions  ADLs/Self Care Home Management;Aquatic Therapy;DME Instruction;Gait training;Stair training;Functional mobility training;Therapeutic activities;Therapeutic exercise;Balance training;Neuromuscular re-education;Patient/family education;Energy conservation;Passive range of motion;Vestibular    PT Next Visit Plan  continue to work on use of rollator- ramps and curb training; continue to work on balance. work on slowing pt down, emphasis on low amplitude with movements/gait    Consulted and Agree with Plan of Care  Patient       Patient will benefit from skilled therapeutic intervention in order to improve the following deficits and impairments:  Abnormal gait, Decreased activity tolerance, Decreased balance, Decreased endurance, Decreased knowledge of precautions, Decreased knowledge of use of DME, Decreased safety awareness, Difficulty walking, Impaired sensation, Improper body mechanics, Obesity  Visit Diagnosis: Other abnormalities of gait and mobility  Unsteadiness on feet  Muscle weakness (generalized)     Problem List Patient Active Problem List   Diagnosis Date Noted  . Headache 07/02/2018  . Anxiety 06/18/2017  . Cervical high risk HPV (human papillomavirus) test positive 03/20/2017  . 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 Medication refill 11/19/2016  . SLE (systemic lupus erythematosus) ([facility]) 10/30/2016  . Cognitive disorder   . Debility 10/12/2016  . Cerebral atrophy ([facility])   . MR (mental retardation)   . Drug-induced lupus erythematosus   .  Tachycardia   . Hypokalemia   . Acute blood loss anemia   . Hyperthyroidism 10/03/2016  . Morbid obesity with BMI of 40.0-44.9, adult ([facility]) 10/03/2016  . 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 Microcytic anemia 10/03/2016  . Cardiomegaly 10/03/2016  . Pleural effusion   . Pleural effusion, left   . Graves disease 08/23/2016    DildayDonavan Burnet, PT, ATRIC 04/13/2019, 9:27 PM  [facility] [address] [place], [place], [address] Phone: [phone]   Fax:  [phone]  Name: Erika Boyer MRN: [id number] Date of Birth: 06/06/1995

## 2019-04-15 ENCOUNTER — Ambulatory Visit: Payer: Medicare Other

## 2019-04-15 ENCOUNTER — Other Ambulatory Visit: Payer: Self-pay

## 2019-04-15 DIAGNOSIS — M6281 Muscle weakness (generalized): Secondary | ICD-10-CM

## 2019-04-15 DIAGNOSIS — R2681 Unsteadiness on feet: Secondary | ICD-10-CM | POA: Diagnosis not present

## 2019-04-15 DIAGNOSIS — R2689 Other abnormalities of gait and mobility: Secondary | ICD-10-CM

## 2019-04-15 NOTE — Therapy (Signed)
[facility] [address] [place], [place], [address] Phone: [phone]   Fax:  [phone]  Physical Therapy Treatment  Patient Details  Name: Erika Boyer MRN: [id number] Date of Birth: 03-20-1996 Referring Provider (PT): Antony Blackbird, MD   Encounter Date: 04/15/2019  PT End of Session - 04/15/19 1451    Visit Number  7    Number of Visits  17    Date for PT Re-Evaluation  05/18/19    Authorization Type  Medicare and Medicaid-10th visit progress note    PT Start Time  1447    PT Stop Time  1526    PT Time Calculation (min)  39 min    Equipment Utilized During Treatment  Gait belt   ankle aquatic cuffs; pool noodle   Activity Tolerance  Patient tolerated treatment well    Behavior During Therapy  Impulsive;WFL for tasks assessed/performed       Past Medical History:  Diagnosis Date  . Anemia   . Anxiety   . Arthritis    "right knee" (10/11/2016)  . Cerebellar degeneration ([place])   . Chronic bronchitis ([place])   . Daily headache   . Depression   . Graves disease 2012   hx; "was on RX; it disappeared" (10/11/2016)  . History of blood transfusion    "related to the anemia" (10/11/2016)  . Hypertension   . Lupus ([place])    "not sure what kind" (10/11/2016)  . Pneumonia 10/03/2016    Past Surgical History:  Procedure Laterality Date  . PERICARDIOCENTESIS N/A 10/04/2016   Procedure: Pericardiocentesis;  Surgeon: Adrian Prows, MD;  Location: [facility];  Service: Cardiovascular;  Laterality: N/A;  . REDUCTION MAMMAPLASTY Bilateral 2012  . TONSILLECTOMY      There were no vitals filed for this visit.  Subjective Assessment - 04/15/19 1451    Subjective  Pt reports that she is doing well. Feels she would like to stop aquatic after next week.    Patient is accompained by:  Family member   mother   Pertinent History  cerebellar degeneration, Lupus    Limitations  Walking    How long can you walk  comfortably?  30 secs    Patient Stated Goals  "I want to get stronger."    Currently in Pain?  No/denies         [facility] PT Assessment - 04/15/19 1503      Functional Gait  Assessment   Gait assessed   Yes    Gait Level Surface  Walks 20 ft in less than 7 sec but greater than 5.5 sec, uses assistive device, slower speed, mild gait deviations, or deviates 6-10 in outside of the 12 in walkway width.    Change in Gait Speed  Able to change speed, demonstrates mild gait deviations, deviates 6-10 in outside of the 12 in walkway width, or no gait deviations, unable to achieve a major change in velocity, or uses a change in velocity, or uses an assistive device.    Gait with Horizontal Head Turns  Performs head turns with moderate changes in gait velocity, slows down, deviates 10-15 in outside 12 in walkway width but recovers, can continue to walk.    Gait with Vertical Head Turns  Performs task with moderate change in gait velocity, slows down, deviates 10-15 in outside 12 in walkway width but recovers, can continue to walk.    Gait and Pivot Turn  Pivot turns safely in greater than 3  sec and stops with no loss of balance, or pivot turns safely within 3 sec and stops with mild imbalance, requires small steps to catch balance.    Step Over Obstacle  Is able to step over one shoe box (4.5 in total height) but must slow down and adjust steps to clear box safely. May require verbal cueing.    Gait with Narrow Base of Support  Ambulates less than 4 steps heel to toe or cannot perform without assistance.    Gait with Eyes Closed  Cannot walk 20 ft without assistance, severe gait deviations or imbalance, deviates greater than 15 in outside 12 in walkway width or will not attempt task.    Ambulating Backwards  Walks 20 ft, slow speed, abnormal gait pattern, evidence for imbalance, deviates 10-15 in outside 12 in walkway width.    Steps  Alternating feet, must use rail.    Total Score  12                    [facility] Adult PT Treatment/Exercise - 04/15/19 1510      Ambulation/Gait   Ambulation/Gait  Yes    Ambulation/Gait Assistance  6: Modified independent (Device/Increase time);5: Supervision    Ambulation/Gait Assistance Details  Verbal cues to try to slow down to control steps better.     Ambulation Distance (Feet)  575 Feet    Assistive device  4-wheeled walker    Gait Pattern  Step-through pattern;Ataxic    Ambulation Surface  Level;Indoor      Neuro Re-ed    Neuro Re-ed Details   Pt performed alternating toe taps on 4" step with 1 UE support x 10, alternating taps on cone x 10 with light bilateral UE support. Verbal cues to try to control movements and move slower. Tapping 3 cones with bilateral UE support for prolonged SLS time x 5 each LE. Side stepping along bars with light UE support x 2 laps then marching with 1 UE support with 1 UE support x 2 laps.                PT Short Term Goals - 04/15/19 1452      PT SHORT TERM GOAL #1   Title  Pt will be independent with initial HEP in order to indicate decreased fall risk and improved functional mobility (Target Date: 04/18/19)    Baseline  Pt reports she has been working on her exercises daily.    Time  4    Period  Weeks    Status  Achieved    Target Date  04/18/19      PT SHORT TERM GOAL #2   Title  Pt will demonstrate safe use of rollator over indoor/outdoor surfaces x 1000' at mod I level in order to indicate safe community negotiation.    Baseline  Pt ambulated 575' with rollator indoors today mod I.    Status  On-going      PT SHORT TERM GOAL #3   Title  Pt will improve FGA score to >/=16/30 in order to indicate decreased fall risk.    Baseline  12/30 on 04/15/2019    Status  On-going      PT SHORT TERM GOAL #4   Title  Pt will report improved home routine to allow for better food choices along with more appropriate work out/walking schedule to lead to improved overall health.     Baseline  Pt reports that she has not been doing as much at  home or getting to gym. She does have stationary bike at home but has not been using lately. Has just been doing the exercises from therapy at this time. Reports past couple days she has been doing a little better with food choices but not consistent.    Status  On-going        PT Long Term Goals - 04/13/19 2126      PT LONG TERM GOAL #1   Title  Pt will be independent with home and pool HEP in order to indicate decreased fall risk and improved functional mobility. (Target Date: 05/18/19)    Time  8    Period  Weeks    Status  New      PT LONG TERM GOAL #2   Title  Pt will improve FGA to >/=19/30 in order to indicate decreased fall risk.      PT LONG TERM GOAL #3   Title  Pt will ambulate with gait speed >/=3.6 ft/sec with improved safety in order to indicate safe community ambulation.      PT LONG TERM GOAL #4   Title  Pt will ambulate >1000' w/ LRAD over varying outdoor surfaces while scanning environment with minimal veering, no overt LOB in order to indicate safe community negotation.            Plan - 04/15/19 2103    Clinical Impression Statement  Pt's STG were reassessed. Pt met initial HEP. She was able to increase gait distance with rollator but short of goal. Pt declined on FGA testing as has very ataxic gait without AD. Pt needed frequent rest breaks during activities today. Continues to benefit from skilled PT to work on balance and gait safety.    Personal Factors and Comorbidities  Comorbidity 3+    Comorbidities  see above, plus obesity    Examination-Activity Limitations  Lift;Locomotion Level;Squat;Stairs;Stand    Examination-Participation Restrictions  Community Activity;School    Stability/Clinical Decision Making  Evolving/Moderate complexity    Rehab Potential  Good    PT Frequency  2x / week    PT Duration  8 weeks    PT Treatment/Interventions  ADLs/Self Care Home Management;Aquatic Therapy;DME  Instruction;Gait training;Stair training;Functional mobility training;Therapeutic activities;Therapeutic exercise;Balance training;Neuromuscular re-education;Patient/family education;Energy conservation;Passive range of motion;Vestibular    PT Next Visit Plan  continue to work on use of rollator- ramps and curb training; continue to work on balance. work on slowing pt down, emphasis on low amplitude with movements/gait    Consulted and Agree with Plan of Care  Patient       Patient will benefit from skilled therapeutic intervention in order to improve the following deficits and impairments:  Abnormal gait, Decreased activity tolerance, Decreased balance, Decreased endurance, Decreased knowledge of precautions, Decreased knowledge of use of DME, Decreased safety awareness, Difficulty walking, Impaired sensation, Improper body mechanics, Obesity  Visit Diagnosis: Other abnormalities of gait and mobility  Muscle weakness (generalized)     Problem List Patient Active Problem List   Diagnosis Date Noted  . Headache 07/02/2018  . Anxiety 06/18/2017  . Cervical high risk HPV (human papillomavirus) test positive 03/20/2017  . 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 Medication refill 11/19/2016  . SLE (systemic lupus erythematosus) ([place]) 10/30/2016  . Cognitive disorder   . Debility 10/12/2016  . Cerebral atrophy ([place])   . MR (mental retardation)   . Drug-induced lupus erythematosus   . Tachycardia   . Hypokalemia   . Acute blood loss anemia   . Hyperthyroidism  10/03/2016  . Morbid obesity with BMI of 40.0-44.9, adult ([place]) 10/03/2016  . 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 Microcytic anemia 10/03/2016  . Cardiomegaly 10/03/2016  . Pleural effusion   . Pleural effusion, left   . Graves disease 08/23/2016    Electa Sniff, PT, DPT, NCS 04/15/2019, 9:07 PM  [facility] [address] [place], [place], [address] Phone:  [phone]   Fax:  [phone]  Name: Erika Boyer MRN: [id number] Date of Birth: October 24, 1995

## 2019-04-20 ENCOUNTER — Ambulatory Visit: Payer: Medicare Other | Admitting: Rehabilitation

## 2019-04-20 ENCOUNTER — Other Ambulatory Visit: Payer: Self-pay

## 2019-04-20 ENCOUNTER — Encounter: Payer: Self-pay | Admitting: Rehabilitation

## 2019-04-20 DIAGNOSIS — R2689 Other abnormalities of gait and mobility: Secondary | ICD-10-CM | POA: Diagnosis not present

## 2019-04-20 DIAGNOSIS — M6281 Muscle weakness (generalized): Secondary | ICD-10-CM | POA: Diagnosis not present

## 2019-04-20 DIAGNOSIS — R2681 Unsteadiness on feet: Secondary | ICD-10-CM

## 2019-04-20 NOTE — Therapy (Signed)
[facility] [address] [place], [place], [address] Phone: [phone]   Fax:  [phone]  Physical Therapy Treatment  Patient Details  Name: Erika FlatteryOmaure Boyer MRN: [id number] Date of Birth: 1995/09/25 Referring Provider (PT): Cain Saupeammie Fulp, MD   Encounter Date: 04/20/2019  PT End of Session - 04/20/19 1621    Visit Number  8    Number of Visits  17    Date for PT Re-Evaluation  05/18/19    Authorization Type  Medicare and Medicaid-10th visit progress note    PT Start Time  1405    PT Stop Time  1445    PT Time Calculation (min)  40 min    Equipment Utilized During Treatment  Gait belt   ankle aquatic cuffs; pool noodle   Activity Tolerance  Patient tolerated treatment well    Behavior During Therapy  Impulsive;WFL for tasks assessed/performed       Past Medical History:  Diagnosis Date  . Anemia   . Anxiety   . Arthritis    "right knee" (10/11/2016)  . 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 Chronic bronchitis ([facility])   . Daily headache   . Depression   . Graves disease 2012   hx; "was on RX; it disappeared" (10/11/2016)  . History of blood transfusion    "related to the anemia" (10/11/2016)  . Hypertension   . Lupus ([facility])    "not sure what kind" (10/11/2016)  . Pneumonia 10/03/2016    Past Surgical History:  Procedure Laterality Date  . PERICARDIOCENTESIS N/A 10/04/2016   Procedure: Pericardiocentesis;  Surgeon: Yates DecampGanji, Jay, MD;  [facility];  Service: Cardiovascular;  Laterality: N/A;  . REDUCTION MAMMAPLASTY Bilateral 2012  . TONSILLECTOMY      There were no vitals filed for this visit.  Subjective Assessment - 04/20/19 1408    Subjective  Pt reports she is doing well, some ankle pain today.    Pertinent History  cerebellar degeneration, Lupus    Limitations  Walking    How long can you walk comfortably?  30 secs    Patient Stated Goals  "I want to get stronger."    Currently in Pain?   Yes    Pain Score  3     Pain Location  Ankle    Pain Orientation  Right    Pain Descriptors / Indicators  Aching    Pain Type  Acute pain    Pain Onset  Today    Pain Frequency  Intermittent    Aggravating Factors   walking    Pain Relieving Factors  rest                       [facility] Adult PT Treatment/Exercise - 04/20/19 1415      Ambulation/Gait   Ambulation/Gait  Yes    Ambulation/Gait Assistance  6: Modified independent (Device/Increase time);5: Supervision    Ambulation/Gait Assistance Details  Continue to provide cues for slower gait speed and steps.      Ambulation Distance (Feet)  200 Feet    Assistive device  4-wheeled walker    Gait Pattern  Step-through pattern;Ataxic    Ambulation Surface  Level;Indoor      Neuro Re-ed    Neuro Re-ed Details   NMR for improved balance and coordination:  In //bars, tapping small cups alternating LEs x 15 reps each progressing to tapping across then in front to increase time in SLS x 10  reps.  Verbally added to HEP with cues to use plastic cup at home.  Cues for slower and more controlled movements throughout.  Single UE support throughout.  Marching forward/backwards x 4 laps in // bars with light UE support again cues for slower speed and controlled LE movements, standing on foam balance beam maintaining balance x 2 sets of 20 secs with intermittent UE support, tandem gait on foam beam with light UE support-cues for forward weight shift and slower movements, standing on rocker board vertically biased initially performing hip strategy with posterior/anterior weight shifts.  This was extremely difficult and requires min to mod A at times.  Progressed to facing board horizontally maintaining balance x 2 sets of 20 secs, then slow lateral weight shifts x 10 reps.  Cues througout for "soft" knees to avoid hyperextension for stability.  Wall bumps x 10 reps with feet apart>feet together x 10 reps.  Pt did very well with controlled  movements with this task.  Standing on compliant surface with feet apart x 2 sets of 20 secs, mild sway noted.              PT Education - 04/20/19 1621    Education Details  verbally added cup taps for coordination    Person(s) Educated  Patient    Methods  Explanation    Comprehension  Verbalized understanding       PT Short Term Goals - 04/15/19 1452      PT SHORT TERM GOAL #1   Title  Pt will be independent with initial HEP in order to indicate decreased fall risk and improved functional mobility (Target Date: 04/18/19)    Baseline  Pt reports she has been working on her exercises daily.    Time  4    Period  Weeks    Status  Achieved    Target Date  04/18/19      PT SHORT TERM GOAL #2   Title  Pt will demonstrate safe use of rollator over indoor/outdoor surfaces x 1000' at mod I level in order to indicate safe community negotiation.    Baseline  Pt ambulated 575' with rollator indoors today mod I.    Status  On-going      PT SHORT TERM GOAL #3   Title  Pt will improve FGA score to >/=16/30 in order to indicate decreased fall risk.    Baseline  12/30 on 04/15/2019    Status  On-going      PT SHORT TERM GOAL #4   Title  Pt will report improved home routine to allow for better food choices along with more appropriate work out/walking schedule to lead to improved overall health.    Baseline  Pt reports that she has not been doing as much at home or getting to gym. She does have stationary bike at home but has not been using lately. Has just been doing the exercises from therapy at this time. Reports past couple days she has been doing a little better with food choices but not consistent.    Status  On-going        PT Long Term Goals - 04/13/19 2126      PT LONG TERM GOAL #1   Title  Pt will be independent with home and pool HEP in order to indicate decreased fall risk and improved functional mobility. (Target Date: 05/18/19)    Time  8    Period  Weeks    Status   New  PT LONG TERM GOAL #2   Title  Pt will improve FGA to >/=19/30 in order to indicate decreased fall risk.      PT LONG TERM GOAL #3   Title  Pt will ambulate with gait speed >/=3.6 ft/sec with improved safety in order to indicate safe community ambulation.      PT LONG TERM GOAL #4   Title  Pt will ambulate >1000' w/ LRAD over varying outdoor surfaces while scanning environment with minimal veering, no overt LOB in order to indicate safe community negotation.            Plan - 04/20/19 1622    Clinical Impression Statement  Pt making progress towards goals, just needs continued cues for slower gait speed and task speed with emphasis on more controlled movements.    Personal Factors and Comorbidities  Comorbidity 3+    Comorbidities  see above, plus obesity    Examination-Activity Limitations  Lift;Locomotion Level;Squat;Stairs;Stand    Examination-Participation Restrictions  Community Activity;School    Stability/Clinical Decision Making  Evolving/Moderate complexity    Rehab Potential  Good    PT Frequency  2x / week    PT Duration  8 weeks    PT Treatment/Interventions  ADLs/Self Care Home Management;Aquatic Therapy;DME Instruction;Gait training;Stair training;Functional mobility training;Therapeutic activities;Therapeutic exercise;Balance training;Neuromuscular re-education;Patient/family education;Energy conservation;Passive range of motion;Vestibular    PT Next Visit Plan  continue to work on use of rollator- ramps and curb training; continue to work on balance. work on slowing pt down, emphasis on low amplitude with movements/gait    Consulted and Agree with Plan of Care  Patient       Patient will benefit from skilled therapeutic intervention in order to improve the following deficits and impairments:  Abnormal gait, Decreased activity tolerance, Decreased balance, Decreased endurance, Decreased knowledge of precautions, Decreased knowledge of use of DME, Decreased  safety awareness, Difficulty walking, Impaired sensation, Improper body mechanics, Obesity  Visit Diagnosis: Other abnormalities of gait and mobility  Muscle weakness (generalized)  Unsteadiness on feet     Problem List Patient Active Problem List   Diagnosis Date Noted  . Headache 07/02/2018  . Anxiety 06/18/2017  . Cervical high risk HPV (human papillomavirus) test positive 03/20/2017  . 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 Medication refill 11/19/2016  . SLE (systemic lupus erythematosus) ([facility]) 10/30/2016  . Cognitive disorder   . Debility 10/12/2016  . Cerebral atrophy ([facility])   . MR (mental retardation)   . Drug-induced lupus erythematosus   . Tachycardia   . Hypokalemia   . Acute blood loss anemia   . Hyperthyroidism 10/03/2016  . Morbid obesity with BMI of 40.0-44.9, adult ([facility]) 10/03/2016  . 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 Microcytic anemia 10/03/2016  . Cardiomegaly 10/03/2016  . Pleural effusion   . Pleural effusion, left   . Graves disease 08/23/2016    Harriet Butte, PT, MPT [facility] [address] [place], [place], [address] Phone: [phone]   Fax:  [phone] 04/20/19, 4:24 PM  Name: Erika Boyer MRN: [id number] Date of Birth: 1995-10-04

## 2019-04-22 ENCOUNTER — Encounter: Payer: Self-pay | Admitting: Physical Therapy

## 2019-04-22 ENCOUNTER — Ambulatory Visit: Payer: Medicare Other | Attending: Family Medicine | Admitting: Physical Therapy

## 2019-04-22 ENCOUNTER — Other Ambulatory Visit: Payer: Self-pay

## 2019-04-22 DIAGNOSIS — R2689 Other abnormalities of gait and mobility: Secondary | ICD-10-CM | POA: Diagnosis present

## 2019-04-22 DIAGNOSIS — M6281 Muscle weakness (generalized): Secondary | ICD-10-CM | POA: Diagnosis present

## 2019-04-22 DIAGNOSIS — R2681 Unsteadiness on feet: Secondary | ICD-10-CM | POA: Diagnosis present

## 2019-04-22 NOTE — Therapy (Signed)
[facility] [address] [place], [place], [address] Phone: [phone]   Fax:  [phone]  Physical Therapy Treatment  Patient Details  Name: Erika Boyer MRN: [id number] Date of Birth: Dec 21, 1995 Referring Provider (PT): Cain Saupeammie Fulp, MD   Encounter Date: 04/22/2019  PT End of Session - 04/22/19 2011    Visit Number  9    Number of Visits  17    Date for PT Re-Evaluation  05/18/19    Authorization Type  Medicare and Medicaid-10th visit progress note    PT Start Time  1500    PT Stop Time  1545    PT Time Calculation (min)  45 min    Equipment Utilized During Treatment  --   buoyancy cuffs, pool noodle, buoyancy barbells   Activity Tolerance  Patient tolerated treatment well    Behavior During Therapy  Impulsive;WFL for tasks assessed/performed       Past Medical History:  Diagnosis Date  . Anemia   . Anxiety   . Arthritis    "right knee" (10/11/2016)  . 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 Chronic bronchitis (HCC)   . Daily headache   . Depression   . Graves disease 2012   hx; "was on RX; it disappeared" (10/11/2016)  . History of blood transfusion    "related to the anemia" (10/11/2016)  . Hypertension   . Lupus (HCC)    "not sure what kind" (10/11/2016)  . Pneumonia 10/03/2016    Past Surgical History:  Procedure Laterality Date  . PERICARDIOCENTESIS N/A 10/04/2016   Procedure: Pericardiocentesis;  Surgeon: Yates DecampGanji, Jay, MD;  [facility];  Service: Cardiovascular;  Laterality: N/A;  . REDUCTION MAMMAPLASTY Bilateral 2012  . TONSILLECTOMY      There were no vitals filed for this visit.  Subjective Assessment - 04/22/19 2011    Subjective  Pt states that today will be her last aquatic session.  Will continue with PT in clinic.    Pertinent History  cerebellar degeneration, Lupus    Limitations  Walking    How long can you walk comfortably?  30 secs    Patient Stated Goals  "I want  to get stronger."    Currently in Pain?  No/denies    Pain Onset  Today       [facility] temp 87.6 degrees   Patient seen for aquatic therapy today.  Treatment took place in water 3.5-4 feet deep depending upon activity.  Pt entered and exited the  pool via ramp negotiation with use of hand rails with supervision.  Pt performed water walking 8175m across pool x 4 rep with cues for upright posture, increased step length and reciprocal arm swing.  Also cues to look upright vs down at feet.  Pt needing frequent cues to focus on activity and control  Movements. Forward walking with pt holding bar bells for improved trunk stabilization with buoyancy and support of bar bells  Braiding x 30' x 2 with intermittent UE assist of PTA.  Marching in place 15 reps each leg with 1 UE assist of PTA progressing to CGA-S  Pt performed standing hip extension, abduction and flexion 15 reps each leg with ankle cuff on each leg for incr. Resistance with eccentric contraction.  Cues to control the concentric contraction as LE was very buoyant with the use of the cuffs.   Squats x 10 reps with UE support on side of pool  Pt performed  Ai Chi postures -  Enclosing - with pt positioned near pool wall but only used for assistance with balance 2 times during the 10 reps     Pt performed bicycling LE's with use of noodle for flotation - supported by PT - 57m x 2 reps across pool Hip abduction/adduction 3 sets 10 reps with pt in supine position with cues to keep feet flexed and to perform quickly for increased resistance of the water.  Pt swam 70m x 2- with use of cuffs on each leg (pts request to use these) for increased resistance/challenge.    Pt exited pool via ramp using hand rails and supervision.  Pt requires buoyancy of water for support and for safety with balance activities with reduced fall risk in water than on land;  Pt requires viscosity of water For resistance for strengthening  exercises    PT Short Term Goals - 04/15/19 1452      PT SHORT TERM GOAL #1   Title  Pt will be independent with initial HEP in order to indicate decreased fall risk and improved functional mobility (Target Date: 04/18/19)    Baseline  Pt reports she has been working on her exercises daily.    Time  4    Period  Weeks    Status  Achieved    Target Date  04/18/19      PT SHORT TERM GOAL #2   Title  Pt will demonstrate safe use of rollator over indoor/outdoor surfaces x 1000' at mod I level in order to indicate safe community negotiation.    Baseline  Pt ambulated 575' with rollator indoors today mod I.    Status  On-going      PT SHORT TERM GOAL #3   Title  Pt will improve FGA score to >/=16/30 in order to indicate decreased fall risk.    Baseline  12/30 on 04/15/2019    Status  On-going      PT SHORT TERM GOAL #4   Title  Pt will report improved home routine to allow for better food choices along with more appropriate work out/walking schedule to lead to improved overall health.    Baseline  Pt reports that she has not been doing as much at home or getting to gym. She does have stationary bike at home but has not been using lately. Has just been doing the exercises from therapy at this time. Reports past couple days she has been doing a little better with food choices but not consistent.    Status  On-going        PT Long Term Goals - 04/13/19 2126      PT LONG TERM GOAL #1   Title  Pt will be independent with home and pool HEP in order to indicate decreased fall risk and improved functional mobility. (Target Date: 05/18/19)    Time  8    Period  Weeks    Status  New      PT LONG TERM GOAL #2   Title  Pt will improve FGA to >/=19/30 in order to indicate decreased fall risk.      PT LONG TERM GOAL #3   Title  Pt will ambulate with gait speed >/=3.6 ft/sec with improved safety in order to indicate safe community ambulation.      PT LONG TERM GOAL #4   Title  Pt will  ambulate >1000' w/ LRAD over varying outdoor surfaces while scanning environment with minimal veering, no overt LOB  in order to indicate safe community negotation.            Plan - 04/22/19 2013    Clinical Impression Statement  Pt continues to have difficulty maintaining balance in pool and needs frequenct cues to slow down and focus on task and control.  Pt with decreased trunk control and core stabilization and decreased SLS.  Pt's gait improved with UE support on pool noodle with improved upright posture and trunk stability.  Pt had difficulty with braiding and sequencing today.  Pt desires for today to be her last aquatic session and to continue in clinic.    Personal Factors and Comorbidities  Comorbidity 3+    Comorbidities  see above, plus obesity    Examination-Activity Limitations  Lift;Locomotion Level;Squat;Stairs;Stand    Examination-Participation Restrictions  Community Activity;School    Stability/Clinical Decision Making  Evolving/Moderate complexity    Rehab Potential  Good    PT Frequency  2x / week    PT Duration  8 weeks    PT Treatment/Interventions  ADLs/Self Care Home Management;Aquatic Therapy;DME Instruction;Gait training;Stair training;Functional mobility training;Therapeutic activities;Therapeutic exercise;Balance training;Neuromuscular re-education;Patient/family education;Energy conservation;Passive range of motion;Vestibular    PT Next Visit Plan  continue to work on use of rollator- ramps and curb training; continue to work on balance. work on slowing pt down, emphasis on low amplitude with movements/gait    Consulted and Agree with Plan of Care  Patient       Patient will benefit from skilled therapeutic intervention in order to improve the following deficits and impairments:  Abnormal gait, Decreased activity tolerance, Decreased balance, Decreased endurance, Decreased knowledge of precautions, Decreased knowledge of use of DME, Decreased safety awareness,  Difficulty walking, Impaired sensation, Improper body mechanics, Obesity  Visit Diagnosis: Other abnormalities of gait and mobility  Muscle weakness (generalized)  Unsteadiness on feet     Problem List Patient Active Problem List   Diagnosis Date Noted  . Headache 07/02/2018  . Anxiety 06/18/2017  . Cervical high risk HPV (human papillomavirus) test positive 03/20/2017  . 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 Medication refill 11/19/2016  . SLE (systemic lupus erythematosus) ([place]) 10/30/2016  . Cognitive disorder   . Debility 10/12/2016  . Cerebral atrophy ([place])   . MR (mental retardation)   . Drug-induced lupus erythematosus   . Tachycardia   . Hypokalemia   . Acute blood loss anemia   . Hyperthyroidism 10/03/2016  . Morbid obesity with BMI of 40.0-44.9, adult ([place]) 10/03/2016  . Cerebellar degeneration ([place]) 10/03/2016  . Microcytic anemia 10/03/2016  . Cardiomegaly 10/03/2016  . Pleural effusion   . Pleural effusion, left   . Graves disease 08/23/2016    Narda Bonds, PTA [place] 04/22/19 8:17 PM Phone: [phone] Fax: [facility] [address] [place], [place], [address] Phone: [phone]   Fax:  [phone]  Name: Erika Boyer MRN: [id number] Date of Birth: 1996/04/06

## 2019-04-23 ENCOUNTER — Telehealth: Payer: Self-pay | Admitting: Family Medicine

## 2019-04-23 NOTE — Telephone Encounter (Signed)
Patient mother came in and dropped of paper work

## 2019-04-27 ENCOUNTER — Ambulatory Visit: Payer: Medicare Other | Admitting: Physical Therapy

## 2019-04-27 ENCOUNTER — Other Ambulatory Visit: Payer: Self-pay

## 2019-04-27 ENCOUNTER — Telehealth: Payer: Self-pay

## 2019-04-27 DIAGNOSIS — R2689 Other abnormalities of gait and mobility: Secondary | ICD-10-CM | POA: Diagnosis not present

## 2019-04-27 DIAGNOSIS — M6281 Muscle weakness (generalized): Secondary | ICD-10-CM

## 2019-04-27 DIAGNOSIS — R2681 Unsteadiness on feet: Secondary | ICD-10-CM

## 2019-04-27 NOTE — Telephone Encounter (Signed)
CMN for rollator faxed to [employer] 

## 2019-04-28 ENCOUNTER — Ambulatory Visit: Payer: Medicare Other

## 2019-04-28 ENCOUNTER — Encounter: Payer: Self-pay | Admitting: Physical Therapy

## 2019-04-28 NOTE — Therapy (Signed)
[facility] [address] [place], [place], [address] Phone: [phone]   Fax:  [phone]  Physical Therapy Treatment & 10th visit progress note  Patient Details  Name: Erika Boyer MRN: [id number] Date of Birth: 04-Jan-1996 Referring Provider (PT): Antony Blackbird, MD   Encounter Date: 04/27/2019  PT End of Session - 04/28/19 2118    Visit Number  10    Number of Visits  17    Date for PT Re-Evaluation  05/18/19    Authorization Type  Medicare and Medicaid-10th visit progress note    PT Start Time  1500    PT Stop Time  1545    PT Time Calculation (min)  45 min    Equipment Utilized During Treatment  Other (comment)   buoyancy cuffs, pool noodle, buoyancy barbells   Activity Tolerance  Patient tolerated treatment well    Behavior During Therapy  Impulsive;WFL for tasks assessed/performed       Past Medical History:  Diagnosis Date  . Anemia   . Anxiety   . Arthritis    "right knee" (10/11/2016)  . Cerebellar degeneration ([place])   . Chronic bronchitis ([place])   . Daily headache   . Depression   . Graves disease 2012   hx; "was on RX; it disappeared" (10/11/2016)  . History of blood transfusion    "related to the anemia" (10/11/2016)  . Hypertension   . Lupus ([place])    "not sure what kind" (10/11/2016)  . Pneumonia 10/03/2016    Past Surgical History:  Procedure Laterality Date  . PERICARDIOCENTESIS N/A 10/04/2016   Procedure: Pericardiocentesis;  Surgeon: Adrian Prows, MD;  Location: [facility];  Service: Cardiovascular;  Laterality: N/A;  . REDUCTION MAMMAPLASTY Bilateral 2012  . TONSILLECTOMY      There were no vitals filed for this visit.  Subjective Assessment - 04/28/19 2116    Subjective  Pt presents for aquatic therapy at [facility] accompanied by her mother - they arrive 1 hour 15" early prior to 3:00 scheduled appt time - they have reserved lanes in pool for swimming    Patient is accompained by:   Family member   mother Erika Boyer   Pertinent History  cerebellar degeneration, Lupus    Limitations  Walking    How long can you walk comfortably?  30 secs    Patient Stated Goals  "I want to get stronger."    Currently in Pain?  No/denies    Pain Onset  Today          Aquatic therapy at [facility] - pool temp 87.6 degrees   Patient seen for aquatic therapy today.  Treatment took place in water 3.5-4 feet deep depending upon activity.  Pt entered and exited the  pool via ramp negotiation with use of hand rails with supervision.  Pt performed water walking 86macross pool x 2 reps each - forward, back and side stepping  Forward walking with pt holding bar bells for improved trunk stabilization with buoyancy and support of bar bells   Marching in place 10 reps each leg holding onto noodle  Pt performed standing hip extension, abduction and flexion 15 reps each leg with ankle cuff on each leg for incr. Resistance with eccentric contraction  Squats x 10 reps with UE support on side of pool  Pt performed Ai Chi postures -  Enclosing and soothing postures - 10 reps each - no LOB noted today with this exercise Pt performed balancing -  lifting 1 leg in flexion, contralateral leg in extension 5 reps each side   Pt performed bicycling LE's with use of noodle for flotation - supported by PT - 91mx 1 rep across pool Hip abduction/adduction 2 sets 10 reps with pt in supine position  Pt requires buoyancy of water for support and for safety with balance activities with reduced fall risk in water than on land;  Pt requires viscosity of water For resistance for strengthening exercises                                            PT Short Term Goals - 04/28/19 2127      PT SHORT TERM GOAL #1   Title  Pt will be independent with initial HEP in order to indicate decreased fall risk and improved functional mobility (Target Date: 04/18/19)    Baseline  Pt reports she  has been working on her exercises daily.    Time  4    Period  Weeks    Status  Achieved    Target Date  04/18/19      PT SHORT TERM GOAL #2   Title  Pt will demonstrate safe use of rollator over indoor/outdoor surfaces x 1000' at mod I level in order to indicate safe community negotiation.    Baseline  Pt ambulated 575' with rollator indoors today mod I.    Status  On-going      PT SHORT TERM GOAL #3   Title  Pt will improve FGA score to >/=16/30 in order to indicate decreased fall risk.    Baseline  12/30 on 04/15/2019    Status  On-going      PT SHORT TERM GOAL #4   Title  Pt will report improved home routine to allow for better food choices along with more appropriate work out/walking schedule to lead to improved overall health.    Baseline  Pt reports that she has not been doing as much at home or getting to gym. She does have stationary bike at home but has not been using lately. Has just been doing the exercises from therapy at this time. Reports past couple days she has been doing a little better with food choices but not consistent.    Status  On-going        PT Long Term Goals - 04/28/19 2128      PT LONG TERM GOAL #1   Title  Pt will be independent with home and pool HEP in order to indicate decreased fall risk and improved functional mobility. (Target Date: 05/18/19)    Time  8    Period  Weeks    Status  New      PT LONG TERM GOAL #2   Title  Pt will improve FGA to >/=19/30 in order to indicate decreased fall risk.      PT LONG TERM GOAL #3   Title  Pt will ambulate with gait speed >/=3.6 ft/sec with improved safety in order to indicate safe community ambulation.      PT LONG TERM GOAL #4   Title  Pt will ambulate >1000' w/ LRAD over varying outdoor surfaces while scanning environment with minimal veering, no overt LOB in order to indicate safe community negotation.            Plan - 04/28/19 2119    Clinical  Impression Statement  This 10th visit progress  note covers dates 03-19-19 - 04-27-19.  Pt has been participating in aquatic PT as well as land appts.  Pt has obtained a rollator which has significantly increased safety with gait by increasing stability.  Pt continues to have ataxic gait pattern and needs cues to decrease speed with movements for increased control and safety.  Pt has met STG #1 with STG's #2-4 ongoing.    Personal Factors and Comorbidities  Comorbidity 3+    Comorbidities  see above, plus obesity    Examination-Activity Limitations  Lift;Locomotion Level;Squat;Stairs;Stand    Examination-Participation Restrictions  Community Activity;School    Stability/Clinical Decision Making  Evolving/Moderate complexity    Rehab Potential  Good    PT Frequency  2x / week    PT Duration  8 weeks    PT Treatment/Interventions  ADLs/Self Care Home Management;Aquatic Therapy;DME Instruction;Gait training;Stair training;Functional mobility training;Therapeutic activities;Therapeutic exercise;Balance training;Neuromuscular re-education;Patient/family education;Energy conservation;Passive range of motion;Vestibular    PT Next Visit Plan  continue to work on use of rollator- ramps and curb training; continue to work on balance. work on slowing pt down, emphasis on low amplitude with movements/gait    Consulted and Agree with Plan of Care  Patient       Patient will benefit from skilled therapeutic intervention in order to improve the following deficits and impairments:  Abnormal gait, Decreased activity tolerance, Decreased balance, Decreased endurance, Decreased knowledge of precautions, Decreased knowledge of use of DME, Decreased safety awareness, Difficulty walking, Impaired sensation, Improper body mechanics, Obesity  Visit Diagnosis: Other abnormalities of gait and mobility  Muscle weakness (generalized)  Unsteadiness on feet     Problem List Patient Active Problem List   Diagnosis Date Noted  . Headache 07/02/2018  . Anxiety  06/18/2017  . Cervical high risk HPV (human papillomavirus) test positive 03/20/2017  . 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 Medication refill 11/19/2016  . SLE (systemic lupus erythematosus) ([place]) 10/30/2016  . Cognitive disorder   . Debility 10/12/2016  . Cerebral atrophy ([place])   . MR (mental retardation)   . Drug-induced lupus erythematosus   . Tachycardia   . Hypokalemia   . Acute blood loss anemia   . Hyperthyroidism 10/03/2016  . Morbid obesity with BMI of 40.0-44.9, adult ([place]) 10/03/2016  . Cerebellar degeneration ([place]) 10/03/2016  . Microcytic anemia 10/03/2016  . Cardiomegaly 10/03/2016  . Pleural effusion   . Pleural effusion, left   . Graves disease 08/23/2016    [place], PT, [place] 04/28/2019, 9:30 PM  [facility] [address] [place], [place], [address] Phone: [phone]   Fax:  [phone]  Name: Erika Boyer MRN: [id number] Date of Birth: Sep 10, 1995

## 2019-04-30 ENCOUNTER — Ambulatory Visit: Payer: Medicare Other

## 2019-05-05 ENCOUNTER — Ambulatory Visit: Payer: Medicare Other

## 2019-05-07 ENCOUNTER — Encounter: Payer: Self-pay | Admitting: Physical Therapy

## 2019-05-07 ENCOUNTER — Other Ambulatory Visit: Payer: Self-pay

## 2019-05-07 ENCOUNTER — Ambulatory Visit: Payer: Medicare Other

## 2019-05-07 ENCOUNTER — Ambulatory Visit: Payer: Medicare Other | Admitting: Physical Therapy

## 2019-05-07 DIAGNOSIS — R2689 Other abnormalities of gait and mobility: Secondary | ICD-10-CM

## 2019-05-07 DIAGNOSIS — M6281 Muscle weakness (generalized): Secondary | ICD-10-CM

## 2019-05-07 DIAGNOSIS — R2681 Unsteadiness on feet: Secondary | ICD-10-CM

## 2019-05-08 NOTE — Therapy (Signed)
[facility] [address] [place], [place], [address] Phone: [phone]   Fax:  [phone]  Physical Therapy Treatment  Patient Details  Name: Erika Boyer MRN: [id number] Date of Birth: 20-Aug-1995 Referring Provider (PT): Cain Saupe, MD   Encounter Date: 05/07/2019  PT End of Session - 05/07/19 1654    Visit Number  11    Number of Visits  17    Date for PT Re-Evaluation  05/18/19    Authorization Type  Medicare and Medicaid-10th visit progress note    PT Start Time  1445    PT Stop Time  1532    PT Time Calculation (min)  47 min    Equipment Utilized During Treatment  Gait belt    Activity Tolerance  Patient tolerated treatment well    Behavior During Therapy  Impulsive;WFL for tasks assessed/performed       Past Medical History:  Diagnosis Date  . Anemia   . Anxiety   . Arthritis    "right knee" (10/11/2016)  . 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 Chronic bronchitis (HCC)   . Daily headache   . Depression   . Graves disease 2012   hx; "was on RX; it disappeared" (10/11/2016)  . History of blood transfusion    "related to the anemia" (10/11/2016)  . Hypertension   . Lupus (HCC)    "not sure what kind" (10/11/2016)  . Pneumonia 10/03/2016    Past Surgical History:  Procedure Laterality Date  . PERICARDIOCENTESIS N/A 10/04/2016   Procedure: Pericardiocentesis;  Surgeon: Yates Decamp, MD;  Location: [facility] INVASIVE CV LAB;  Service: Cardiovascular;  Laterality: N/A;  . REDUCTION MAMMAPLASTY Bilateral 2012  . TONSILLECTOMY      There were no vitals filed for this visit.  Subjective Assessment - 05/07/19 1529    Subjective  Denies falls or changes.  Not using her rollator today.  Says her mom does like her using it.  doesnt want her to get dependent on it.    Patient is accompained by:  Family member   mother Erika Boyer   Pertinent History  cerebellar degeneration, Lupus    Limitations  Walking    How long  can you walk comfortably?  30 secs    Patient Stated Goals  "I want to get stronger."    Currently in Pain?  No/denies    Pain Onset  Today             [facility] Adult PT Treatment/Exercise - 05/08/19 0001      Transfers   Transfers  Sit to Stand;Stand to Sit    Sit to Stand  6: Modified independent (Device/Increase time)    Stand to Sit  6: Modified independent (Device/Increase time)      Ambulation/Gait   Ambulation/Gait  Yes    Ambulation/Gait Assistance  6: Modified independent (Device/Increase time);5: Supervision    Ambulation Distance (Feet)  --   into and out of clinic and between activities   Assistive device  None    Gait Pattern  Step-through pattern;Ataxic    Ambulation Surface  Level;Indoor    Gait Comments  gait without device into/out of clinic with min guard assist due to pt with unsteadiness, veering, excessive trunk/arm movements and fast gait speed. cues to slow down needed with all gait.       Neuro Re-ed    Neuro Re-ed Details   Forward<>backward stepping across beam in floor with 1-2 UE support and cues for technique  and to control movement.  Repeated >30 times.  SLS x 10 sec x 4 reps each side.        Exercises   Exercises  Knee/Hip      Knee/Hip Exercises: Aerobic   Other Aerobic  Scifit level 1.5 all 4 extremities x 5 minutes for endurance/strengthening      Knee/Hip Exercises: Standing   Heel Raises  Both;10 reps    Hip Flexion  Both;15 reps;Knee bent;Stengthening    Hip Abduction  15 reps;Knee straight;Both;Stengthening    Functional Squat  10 reps;2 sets   intermittent UE support;raises onto toes at times due to LOB   SLS  3 reps each side x 10 sec    Other Standing Knee Exercises  tandem stance 2 reps each direction x 10 sec      Noted pt tending to walk on outer edge of her boots when walking into clinic with excessive pronation.  Had pt remove boots at edge of mat and stand.  Pt with excessive foot arch flattening along with pronation of foot  and decreased lateral ankle stability bilaterally.  Erika ChessmanSuzanne Boyer, PT, assessed pt along with PTA.  Pt reports having shoe orthotics when she was 15 but never wore them.  States that the boots she is currently wearing are old (they are soft sided pull on boots with no arch support and pt has actually compressed molded heel and is walking on/over lateral edge of boot). Pt reports that her mom does not want her to be reliant on the Rollator but pt feels like she needs it at times to balance.  Pt with veering and poor trunk/hip control during gait but did not need assist to recover LOB.       PT Short Term Goals - 04/28/19 2127      PT SHORT TERM GOAL #1   Title  Pt will be independent with initial HEP in order to indicate decreased fall risk and improved functional mobility (Target Date: 04/18/19)    Baseline  Pt reports she has been working on her exercises daily.    Time  4    Period  Weeks    Status  Achieved    Target Date  04/18/19      PT SHORT TERM GOAL #2   Title  Pt will demonstrate safe use of rollator over indoor/outdoor surfaces x 1000' at mod I level in order to indicate safe community negotiation.    Baseline  Pt ambulated 575' with rollator indoors today mod I.    Status  On-going      PT SHORT TERM GOAL #3   Title  Pt will improve FGA score to >/=16/30 in order to indicate decreased fall risk.    Baseline  12/30 on 04/15/2019    Status  On-going      PT SHORT TERM GOAL #4   Title  Pt will report improved home routine to allow for better food choices along with more appropriate work out/walking schedule to lead to improved overall health.    Baseline  Pt reports that she has not been doing as much at home or getting to gym. She does have stationary bike at home but has not been using lately. Has just been doing the exercises from therapy at this time. Reports past couple days she has been doing a little better with food choices but not consistent.    Status  On-going         PT Long Term Goals -  04/28/19 2128      PT LONG TERM GOAL #1   Title  Pt will be independent with home and pool HEP in order to indicate decreased fall risk and improved functional mobility. (Target Date: 05/18/19)    Time  8    Period  Weeks    Status  New      PT LONG TERM GOAL #2   Title  Pt will improve FGA to >/=19/30 in order to indicate decreased fall risk.      PT LONG TERM GOAL #3   Title  Pt will ambulate with gait speed >/=3.6 ft/sec with improved safety in order to indicate safe community ambulation.      PT LONG TERM GOAL #4   Title  Pt will ambulate >1000' w/ LRAD over varying outdoor surfaces while scanning environment with minimal veering, no overt LOB in order to indicate safe community negotation.            Plan - 05/07/19 1655    Clinical Impression Statement  Pt ambulating into clinic without rollator today.  Continues to be very unsteady and "floppy" with low tone during gait with poor trunk/hip control and decreased stability.  As noted in treatment discussed/observed foot/arch flat and pronation with Guido Sander, PT, who feels like pt would benefit from bilateral foot orthotics but unsure about insurance coverage. Continue PT per POC.    Personal Factors and Comorbidities  Comorbidity 3+    Comorbidities  see above, plus obesity    Examination-Activity Limitations  Lift;Locomotion Level;Squat;Stairs;Stand    Examination-Participation Restrictions  Community Activity;School    Stability/Clinical Decision Making  Evolving/Moderate complexity    Rehab Potential  Good    PT Frequency  2x / week    PT Duration  8 weeks    PT Treatment/Interventions  ADLs/Self Care Home Management;Aquatic Therapy;DME Instruction;Gait training;Stair training;Functional mobility training;Therapeutic activities;Therapeutic exercise;Balance training;Neuromuscular re-education;Patient/family education;Energy conservation;Passive range of motion;Vestibular    PT Next Visit  Plan  Would bilateral orthotics be covered for insurance puposes to address foot flat and foot pronation.  continue to work on use of rollator- ramps and curb training; continue to work on balance. work on slowing pt down, emphasis on low amplitude with movements/gait    Consulted and Agree with Plan of Care  Patient       Patient will benefit from skilled therapeutic intervention in order to improve the following deficits and impairments:  Abnormal gait, Decreased activity tolerance, Decreased balance, Decreased endurance, Decreased knowledge of precautions, Decreased knowledge of use of DME, Decreased safety awareness, Difficulty walking, Impaired sensation, Improper body mechanics, Obesity  Visit Diagnosis: Other abnormalities of gait and mobility  Muscle weakness (generalized)  Unsteadiness on feet     Problem List Patient Active Problem List   Diagnosis Date Noted  . Headache 07/02/2018  . Anxiety 06/18/2017  . Cervical high risk HPV (human papillomavirus) test positive 03/20/2017  . 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 Medication refill 11/19/2016  . SLE (systemic lupus erythematosus) ([place]) 10/30/2016  . Cognitive disorder   . Debility 10/12/2016  . Cerebral atrophy ([place])   . MR (mental retardation)   . Drug-induced lupus erythematosus   . Tachycardia   . Hypokalemia   . Acute blood loss anemia   . Hyperthyroidism 10/03/2016  . Morbid obesity with BMI of 40.0-44.9, adult ([place]) 10/03/2016  . Cerebellar degeneration ([place]) 10/03/2016  . Microcytic anemia 10/03/2016  . Cardiomegaly 10/03/2016  . Pleural effusion   . Pleural effusion, left   .  Graves disease 08/23/2016    Newell Coral, PTA [facility] 05/08/19 4:43 PM Phone: [phone] Fax: [phone]   [facility] Out[facility] [address] [place], [place], [address] Phone: [phone]   Fax:  [phone]  Name: Jessenya Berdan MRN: [id number] Date of Birth: 1995-06-08

## 2019-05-11 ENCOUNTER — Ambulatory Visit: Payer: Medicare Other

## 2019-05-12 ENCOUNTER — Other Ambulatory Visit: Payer: Self-pay

## 2019-05-12 ENCOUNTER — Encounter: Payer: Self-pay | Admitting: Physical Therapy

## 2019-05-12 ENCOUNTER — Ambulatory Visit: Payer: Medicare Other | Admitting: Physical Therapy

## 2019-05-12 DIAGNOSIS — R2689 Other abnormalities of gait and mobility: Secondary | ICD-10-CM | POA: Diagnosis not present

## 2019-05-12 DIAGNOSIS — M6281 Muscle weakness (generalized): Secondary | ICD-10-CM

## 2019-05-12 DIAGNOSIS — R2681 Unsteadiness on feet: Secondary | ICD-10-CM

## 2019-05-12 NOTE — Therapy (Signed)
[facility] [address] [place], [place], [address] Phone: [phone]   Fax:  [phone]  Physical Therapy Treatment  Patient Details  Name: Erika Boyer MRN: [id number] Date of Birth: 08/10/1995 Referring Provider (PT): Antony Blackbird, MD   Encounter Date: 05/12/2019  PT End of Session - 05/12/19 1510    Visit Number  12    Number of Visits  17    Date for PT Re-Evaluation  05/18/19    Authorization Type  Medicare and Medicaid-10th visit progress note    PT Start Time  1404    PT Stop Time  1448    PT Time Calculation (min)  44 min    Activity Tolerance  Patient tolerated treatment well    Behavior During Therapy  Impulsive;WFL for tasks assessed/performed       Past Medical History:  Diagnosis Date  . Anemia   . Anxiety   . Arthritis    "right knee" (10/11/2016)  . Cerebellar degeneration ([place])   . Chronic bronchitis ([place])   . Daily headache   . Depression   . Graves disease 2012   hx; "was on RX; it disappeared" (10/11/2016)  . History of blood transfusion    "related to the anemia" (10/11/2016)  . Hypertension   . Lupus ([place])    "not sure what kind" (10/11/2016)  . Pneumonia 10/03/2016    Past Surgical History:  Procedure Laterality Date  . PERICARDIOCENTESIS N/A 10/04/2016   Procedure: Pericardiocentesis;  Surgeon: Adrian Prows, MD;  Location: [facility] CV LAB;  Service: Cardiovascular;  Laterality: N/A;  . REDUCTION MAMMAPLASTY Bilateral 2012  . TONSILLECTOMY      There were no vitals filed for this visit.  Subjective Assessment - 05/12/19 1402    Subjective  Denies falls or changes since last visit.  Did get new boots and gait appears better.  Using rollator today.    Patient is accompained by:  Family member   mother Silva Bandy   Pertinent History  cerebellar degeneration, Lupus    Limitations  Walking    How long can you walk comfortably?  30 secs    Patient Stated Goals  "I want to get  stronger."    Currently in Pain?  No/denies            [facility] Adult PT Treatment/Exercise - 05/12/19 0001      Transfers   Transfers  Sit to Stand;Stand to Sit    Sit to Stand  6: Modified independent (Device/Increase time)    Stand to Sit  6: Modified independent (Device/Increase time)    Number of Reps  10 reps      Ambulation/Gait   Ambulation/Gait  Yes    Ambulation/Gait Assistance  6: Modified independent (Device/Increase time);5: Supervision    Ambulation/Gait Assistance Details  continues with ataxic gait and impulsive    Ambulation Distance (Feet)  110 Feet   x 2 and around clinic with activities   Assistive device  Rollator    Gait Pattern  Step-through pattern;Ataxic    Ambulation Surface  Level;Indoor      Exercises   Exercises  Knee/Hip      Knee/Hip Exercises: Standing   Hip Flexion  Both;15 reps;Knee bent;Stengthening    Terminal Knee Extension  Both;Strengthening;1 set;10 reps;Theraband   red band   Theraband Level (Terminal Knee Extension)  Level 2 (Red)    Hip Abduction  15 reps;Knee straight;Both;Stengthening    Other Standing Knee Exercises  forward<>backward step  weight shifting in // bars x 20 reps each side with 1 UE support      Knee/Hip Exercises: Seated   Long Arc Quad  Both;Strengthening;2 sets;15 reps;Weights    Long Arc Quad Weight  4 lbs.      Knee/Hip Exercises: Supine   Bridges  Both;2 sets;10 reps    Straight Leg Raises  Strengthening;Both;2 sets;10 reps    Other Supine Knee/Hip Exercises  hooklying for clamshell bil hip adbuction x 10 x 2 resp bil then single side x 10 x 2      Knee/Hip Exercises: Prone   Other Prone Exercises  Quadruped with alternating UE lifst x 10 then LE lifts x 10 (not alternating).  Pt needing min guard-min assist and verbal cues for technique and stability.    Other Prone Exercises  Tall kneeling to back on to heels x 15 reps with cues for control               PT Short Term Goals - 04/28/19 2127       PT SHORT TERM GOAL #1   Title  Pt will be independent with initial HEP in order to indicate decreased fall risk and improved functional mobility (Target Date: 04/18/19)    Baseline  Pt reports she has been working on her exercises daily.    Time  4    Period  Weeks    Status  Achieved    Target Date  04/18/19      PT SHORT TERM GOAL #2   Title  Pt will demonstrate safe use of rollator over indoor/outdoor surfaces x 1000' at mod I level in order to indicate safe community negotiation.    Baseline  Pt ambulated 575' with rollator indoors today mod I.    Status  On-going      PT SHORT TERM GOAL #3   Title  Pt will improve FGA score to >/=16/30 in order to indicate decreased fall risk.    Baseline  12/30 on 04/15/2019    Status  On-going      PT SHORT TERM GOAL #4   Title  Pt will report improved home routine to allow for better food choices along with more appropriate work out/walking schedule to lead to improved overall health.    Baseline  Pt reports that she has not been doing as much at home or getting to gym. She does have stationary bike at home but has not been using lately. Has just been doing the exercises from therapy at this time. Reports past couple days she has been doing a little better with food choices but not consistent.    Status  On-going        PT Long Term Goals - 04/28/19 2128      PT LONG TERM GOAL #1   Title  Pt will be independent with home and pool HEP in order to indicate decreased fall risk and improved functional mobility. (Target Date: 05/18/19)    Time  8    Period  Weeks    Status  New      PT LONG TERM GOAL #2   Title  Pt will improve FGA to >/=19/30 in order to indicate decreased fall risk.      PT LONG TERM GOAL #3   Title  Pt will ambulate with gait speed >/=3.6 ft/sec with improved safety in order to indicate safe community ambulation.      PT LONG TERM GOAL #4   Title  Pt will ambulate >1000' w/ LRAD over varying outdoor surfaces while  scanning environment with minimal veering, no overt LOB in order to indicate safe community negotation.            Plan - 05/12/19 1510    Clinical Impression Statement  Pt tolerated varied positions/techniques for strengthening activities today.  Worked hard during session with minimal rest breaks.  did report muscle fatigue at end of session.  continue PT per POC.    Personal Factors and Comorbidities  Comorbidity 3+    Comorbidities  see above, plus obesity    Examination-Activity Limitations  Lift;Locomotion Level;Squat;Stairs;Stand    Examination-Participation Restrictions  Community Activity;School    Stability/Clinical Decision Making  Evolving/Moderate complexity    Rehab Potential  Good    PT Frequency  2x / week    PT Duration  8 weeks    PT Treatment/Interventions  ADLs/Self Care Home Management;Aquatic Therapy;DME Instruction;Gait training;Stair training;Functional mobility training;Therapeutic activities;Therapeutic exercise;Balance training;Neuromuscular re-education;Patient/family education;Energy conservation;Passive range of motion;Vestibular    PT Next Visit Plan  continue to work on use of rollator- ramps and curb training; continue to work on balance. work on slowing pt down, emphasis on low amplitude with movements/gait    Consulted and Agree with Plan of Care  Patient       Patient will benefit from skilled therapeutic intervention in order to improve the following deficits and impairments:  Abnormal gait, Decreased activity tolerance, Decreased balance, Decreased endurance, Decreased knowledge of precautions, Decreased knowledge of use of DME, Decreased safety awareness, Difficulty walking, Impaired sensation, Improper body mechanics, Obesity  Visit Diagnosis: Other abnormalities of gait and mobility  Muscle weakness (generalized)  Unsteadiness on feet     Problem List Patient Active Problem List   Diagnosis Date Noted  . Headache 07/02/2018  . Anxiety  06/18/2017  . Cervical high risk HPV (human papillomavirus) test positive 03/20/2017  . 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 Medication refill 11/19/2016  . SLE (systemic lupus erythematosus) ([facility]) 10/30/2016  . Cognitive disorder   . Debility 10/12/2016  . Cerebral atrophy (HCC)   . MR (mental retardation)   . Drug-induced lupus erythematosus   . Tachycardia   . Hypokalemia   . Acute blood loss anemia   . Hyperthyroidism 10/03/2016  . Morbid obesity with BMI of 40.0-44.9, adult ([facility]) 10/03/2016  . 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 Microcytic anemia 10/03/2016  . Cardiomegaly 10/03/2016  . Pleural effusion   . Pleural effusion, left   . Graves disease 08/23/2016   Newell Coral, PTA [facility] 05/12/19 3:12 PM Phone: [phone] Fax: [phone]   [facility] Out[facility] Rehabilitation [facility] [address] [place], [place], [address] Phone: [phone]   Fax:  [phone]  Name: Keshonna Valvo MRN: [id number] Date of Birth: 07-30-1995

## 2019-05-18 ENCOUNTER — Ambulatory Visit: Payer: Medicare Other

## 2019-05-20 ENCOUNTER — Ambulatory Visit: Payer: Medicare Other

## 2019-05-21 ENCOUNTER — Ambulatory Visit: Payer: Medicare Other | Admitting: Rehabilitation

## 2019-05-22 NOTE — L&D Delivery Note (Signed)
Delivery Note Called to room at 4:35 AM due to pt feeling pressure. Epidural recently placed. Pt pushed a couple of times and at 4:40 AM a non-viable female was delivered via Vaginal, Spontaneous en-caul (Presentation: breech).  APGAR:0 ,0 ; weight: 405g (14.3oz). Cord clamps placed x 2, cord cut by CNM, and maternal clamp visible at introitus. IV Pitocin running and epidural turned off to await pt to feel pressure in order to deliver placenta.   Placenta status: spont, intact at 5:54 AM.     Anesthesia: Epidural Episiotomy: None Lacerations: None Est. Blood Loss (mL): 250cc  Mom to postpartum.  Baby to [place].  Anora testing collected.  Arabella Merles CNM 03/19/2020, 5:04 AM

## 2019-05-26 ENCOUNTER — Ambulatory Visit: Payer: Medicare Other | Admitting: Physical Therapy

## 2019-05-28 ENCOUNTER — Other Ambulatory Visit: Payer: Self-pay

## 2019-05-28 ENCOUNTER — Ambulatory Visit: Payer: Medicare Other | Attending: Family Medicine | Admitting: Rehabilitation

## 2019-05-28 ENCOUNTER — Encounter: Payer: Self-pay | Admitting: Rehabilitation

## 2019-05-28 DIAGNOSIS — M6281 Muscle weakness (generalized): Secondary | ICD-10-CM | POA: Diagnosis not present

## 2019-05-28 DIAGNOSIS — R2689 Other abnormalities of gait and mobility: Secondary | ICD-10-CM | POA: Diagnosis not present

## 2019-05-28 DIAGNOSIS — R2681 Unsteadiness on feet: Secondary | ICD-10-CM | POA: Insufficient documentation

## 2019-05-28 NOTE — Therapy (Signed)
[facility] [address] [place], [place], [address] Phone: [phone]   Fax:  [phone]  Physical Therapy Treatment and DC Summary   Patient Details  Name: Erika Boyer MRN: [id number] Date of Birth: November 20, 1995 Referring Provider (PT): Antony Blackbird, MD   Encounter Date: 05/28/2019  PT End of Session - 05/28/19 1752    Visit Number  13    Number of Visits  17    Date for PT Re-Evaluation  05/18/19    Authorization Type  Medicare and Medicaid-10th visit progress note    PT Start Time  1750    PT Stop Time  1830    PT Time Calculation (min)  40 min    Activity Tolerance  Patient tolerated treatment well    Behavior During Therapy  Impulsive;WFL for tasks assessed/performed       Past Medical History:  Diagnosis Date  . Anemia   . Anxiety   . Arthritis    "right knee" (10/11/2016)  . 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 Chronic bronchitis ([place])   . Daily headache   . Depression   . Graves disease 2012   hx; "was on RX; it disappeared" (10/11/2016)  . History of blood transfusion    "related to the anemia" (10/11/2016)  . Hypertension   . Lupus ([place])    "not sure what kind" (10/11/2016)  . Pneumonia 10/03/2016    Past Surgical History:  Procedure Laterality Date  . PERICARDIOCENTESIS N/A 10/04/2016   Procedure: Pericardiocentesis;  Surgeon: Adrian Prows, MD;  Location: [facility];  Service: Cardiovascular;  Laterality: N/A;  . REDUCTION MAMMAPLASTY Bilateral 2012  . TONSILLECTOMY      There were no vitals filed for this visit.  Subjective Assessment - 05/28/19 1750    Subjective  Pt reports no changes since last session.    Pertinent History  cerebellar degeneration, Lupus    Limitations  Walking    How long can you walk comfortably?  30 secs    Patient Stated Goals  "I want to get stronger."    Currently in Pain?  No/denies         [facility] PT Assessment - 05/28/19 1803      Transfers    Transfers  Sit to Stand;Stand to Sit    Sit to Stand  6: Modified independent (Device/Increase time)    Number of Reps  10 reps    Transfer Cueing  as in HEP with cues for decreased speed and improved control.        Ambulation/Gait   Ambulation/Gait  Yes    Ambulation/Gait Assistance  6: Modified independent (Device/Increase time);5: Supervision    Ambulation/Gait Assistance Details  Simulated outdoor surfaces in therapy gym due to weather and being dark outside.  Pt able to ambulate around and over uneven surfaces at S to mod I level with cues only for decreased speed and safety when moving rollator onto surface.       Ambulation Distance (Feet)  500 Feet    Assistive device  Rollator    Gait Pattern  Step-through pattern;Ataxic    Ambulation Surface  Level;Unlevel   simulated outdoor    Gait velocity  2.39 ft/sec with rollator    Stairs  Yes    Stairs Assistance  6: Modified independent (Device/Increase time)    Stair Management Technique  One rail Right;Alternating pattern;Forwards    Number of Stairs  4    Height of Stairs  6  Functional Gait  Assessment   Gait assessed   Yes    Gait Level Surface  Walks 20 ft, slow speed, abnormal gait pattern, evidence for imbalance or deviates 10-15 in outside of the 12 in walkway width. Requires more than 7 sec to ambulate 20 ft.    Change in Gait Speed  Able to change speed, demonstrates mild gait deviations, deviates 6-10 in outside of the 12 in walkway width, or no gait deviations, unable to achieve a major change in velocity, or uses a change in velocity, or uses an assistive device.    Gait with Horizontal Head Turns  Performs head turns smoothly with slight change in gait velocity (eg, minor disruption to smooth gait path), deviates 6-10 in outside 12 in walkway width, or uses an assistive device.    Gait with Vertical Head Turns  Performs task with slight change in gait velocity (eg, minor disruption to smooth gait path), deviates 6 - 10  in outside 12 in walkway width or uses assistive device    Gait and Pivot Turn  Pivot turns safely within 3 sec and stops quickly with no loss of balance.   with rollator   Step Over Obstacle  Cannot perform without assistance.    Gait with Narrow Base of Support  Ambulates less than 4 steps heel to toe or cannot perform without assistance.    Gait with Eyes Closed  Walks 20 ft, uses assistive device, slower speed, mild gait deviations, deviates 6-10 in outside 12 in walkway width. Ambulates 20 ft in less than 9 sec but greater than 7 sec.    Ambulating Backwards  Walks 20 ft, slow speed, abnormal gait pattern, evidence for imbalance, deviates 10-15 in outside 12 in walkway width.    Steps  Alternating feet, must use rail.    Total Score  15          Access Code: [email or web address]  URL: [email or web address]  Date: 05/28/2019  Prepared by: Cameron Sprang   Exercises Sit to Stand - 10 reps - 1 sets - 1x daily - 5x weekly Heel Toe Raises with Counter Support - 10 reps - 1 sets - 5 hold - 1x daily - 5x weekly Wide Stance with Counter Support - 3 reps - 1 sets - 30 hold - 1x daily - 5x weekly Wide Stance with Head Rotations and Unilateral Counter Support                   - 10 reps - 1 sets - 1x daily - 5x weekly Wide Stance with Head Nods and Unilateral Counter Support - 10 reps - 1 sets - 1x daily - 5x weekly Alternating Step Taps with Counter Support - 20 reps - 1 sets - 1x daily - 7x weekly                  PT Education - 05/28/19 1846    Education Details  education on decreasing gym program to focus on HEP to address specific deficits.  Will DC at this time with plan to return as needed.    Person(s) Educated  Patient;Parent(s)    Methods  Explanation;Demonstration;Handout    Comprehension  Verbalized understanding;Returned demonstration       PT Short Term Goals - 04/28/19 2127      PT SHORT TERM GOAL #1   Title  Pt will be independent with initial HEP in  order to indicate decreased fall risk and improved functional mobility (Target Date: 04/18/19)  Baseline  Pt reports she has been working on her exercises daily.    Time  4    Period  Weeks    Status  Achieved    Target Date  04/18/19      PT SHORT TERM GOAL #2   Title  Pt will demonstrate safe use of rollator over indoor/outdoor surfaces x 1000' at mod I level in order to indicate safe community negotiation.    Baseline  Pt ambulated 575' with rollator indoors today mod I.    Status  On-going      PT SHORT TERM GOAL #3   Title  Pt will improve FGA score to >/=16/30 in order to indicate decreased fall risk.    Baseline  12/30 on 04/15/2019    Status  On-going      PT SHORT TERM GOAL #4   Title  Pt will report improved home routine to allow for better food choices along with more appropriate work out/walking schedule to lead to improved overall health.    Baseline  Pt reports that she has not been doing as much at home or getting to gym. She does have stationary bike at home but has not been using lately. Has just been doing the exercises from therapy at this time. Reports past couple days she has been doing a little better with food choices but not consistent.    Status  On-going        PT Long Term Goals - 05/28/19 1752      PT LONG TERM GOAL #1   Title  Pt will be independent with home and pool HEP in order to indicate decreased fall risk and improved functional mobility. (Target Date: 05/18/19)    Baseline  not performing regularly    Time  8    Period  Weeks    Status  Not Met      PT LONG TERM GOAL #2   Title  Pt will improve FGA to >/=19/30 in order to indicate decreased fall risk.    Baseline  12/30 to 15/30 improvement with use of rollator.    Status  Partially Met      PT LONG TERM GOAL #3   Title  Pt will ambulate with gait speed >/=3.6 ft/sec with improved safety in order to indicate safe community ambulation.    Baseline  2.39 ft/sec with rollator but is safer  at this speed.    Status  Not Met      PT LONG TERM GOAL #4   Title  Pt will ambulate >1000' w/ LRAD over varying outdoor surfaces while scanning environment with minimal veering, no overt LOB in order to indicate safe community negotation.    Baseline  Pt requires cues for slowing down and proper use of rollator at times.    Status  Partially Met        PHYSICAL THERAPY DISCHARGE SUMMARY  Visits from Start of Care: 13  Current functional level related to goals / functional outcomes: See LTGs above   Remaining deficits: Pt continues to demonstrate decreased coordination and ataxic gait, but with use of rollator and slowed speeds, shows improved safety.    Education / Equipment: rollator and HEP   Plan: Patient agrees to discharge.  Patient goals were partially met. Patient is being discharged due to being pleased with the current functional level.  ?????          Plan - 05/28/19 1847    Clinical Impression Statement  Session focused on assessment of LTGs.  Pt has partially met FGA goal, however is high fall risk and is recommended that she use rollator at all times.  Pt is much safer at slower gait speed and with use of RW.  Note that she is more unsafe when she moves quickly.  Discussed focusing on therapy HEP that address balance and coordination deficits.  Pt verbalized understanding and knows that she may return with any change in mobility status.    Personal Factors and Comorbidities  Comorbidity 3+    Comorbidities  see above, plus obesity    Examination-Activity Limitations  Lift;Locomotion Level;Squat;Stairs;Stand    Examination-Participation Restrictions  Community Activity;School    Stability/Clinical Decision Making  Evolving/Moderate complexity    Rehab Potential  Good    PT Frequency  2x / week    PT Duration  8 weeks    PT Treatment/Interventions  ADLs/Self Care Home Management;Aquatic Therapy;DME Instruction;Gait training;Stair training;Functional mobility  training;Therapeutic activities;Therapeutic exercise;Balance training;Neuromuscular re-education;Patient/family education;Energy conservation;Passive range of motion;Vestibular    Consulted and Agree with Plan of Care  Patient       Patient will benefit from skilled therapeutic intervention in order to improve the following deficits and impairments:  Abnormal gait, Decreased activity tolerance, Decreased balance, Decreased endurance, Decreased knowledge of precautions, Decreased knowledge of use of DME, Decreased safety awareness, Difficulty walking, Impaired sensation, Improper body mechanics, Obesity  Visit Diagnosis: Other abnormalities of gait and mobility  Muscle weakness (generalized)  Unsteadiness on feet     Problem List Patient Active Problem List   Diagnosis Date Noted  . Headache 07/02/2018  . Anxiety 06/18/2017  . Cervical high risk HPV (human papillomavirus) test positive 03/20/2017  . 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 Medication refill 11/19/2016  . SLE (systemic lupus erythematosus) ([place]) 10/30/2016  . Cognitive disorder   . Debility 10/12/2016  . Cerebral atrophy ([place])   . MR (mental retardation)   . Drug-induced lupus erythematosus   . Tachycardia   . Hypokalemia   . Acute blood loss anemia   . Hyperthyroidism 10/03/2016  . Morbid obesity with BMI of 40.0-44.9, adult ([place]) 10/03/2016  . 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 Microcytic anemia 10/03/2016  . Cardiomegaly 10/03/2016  . Pleural effusion   . Pleural effusion, left   . Graves disease 08/23/2016   Cameron Sprang, PT, MPT [facility] [address] [place], [place], [address] Phone: [phone]   Fax:  [phone] 05/28/19, 6:51 PM  Name: Erika Boyer MRN: [id number] Date of Birth: 24-Jul-1995

## 2019-06-18 ENCOUNTER — Ambulatory Visit: Payer: Medicare Other | Admitting: Cardiology

## 2019-06-23 ENCOUNTER — Telehealth: Payer: Self-pay | Admitting: Family Medicine

## 2019-06-23 NOTE — Telephone Encounter (Signed)
Patient came in to drop off paperwork to be filled out by her PCP. Patient was informed that paperwork was going to be put in PCP box and that it took 7-14 business days to be filled out.

## 2019-06-26 NOTE — Telephone Encounter (Signed)
Paperwork was received earlier this week

## 2019-07-05 ENCOUNTER — Encounter: Payer: Self-pay | Admitting: Family Medicine

## 2019-07-08 ENCOUNTER — Other Ambulatory Visit: Payer: Self-pay

## 2019-07-08 ENCOUNTER — Ambulatory Visit (INDEPENDENT_AMBULATORY_CARE_PROVIDER_SITE_OTHER): Payer: Medicare Other | Admitting: Family Medicine

## 2019-07-08 ENCOUNTER — Ambulatory Visit: Payer: Medicare Other | Admitting: Family Medicine

## 2019-07-08 ENCOUNTER — Encounter: Payer: Self-pay | Admitting: Family Medicine

## 2019-07-08 ENCOUNTER — Other Ambulatory Visit: Payer: Self-pay | Admitting: Family Medicine

## 2019-07-08 VITALS — BP 110/64 | HR 86 | Temp 97.2°F | Ht 65.0 in | Wt 309.0 lb

## 2019-07-08 DIAGNOSIS — R269 Unspecified abnormalities of gait and mobility: Secondary | ICD-10-CM | POA: Diagnosis not present

## 2019-07-08 DIAGNOSIS — Z6841 Body Mass Index (BMI) 40.0 and over, adult: Secondary | ICD-10-CM

## 2019-07-08 DIAGNOSIS — G319 Degenerative disease of nervous system, unspecified: Secondary | ICD-10-CM | POA: Diagnosis not present

## 2019-07-08 NOTE — Progress Notes (Addendum)
PATIENT: Erika Boyer DOB: Jul 04, 1995  REASON FOR VISIT: follow up HISTORY FROM: patient  Chief Complaint  Patient presents with  . Follow-up    Rm2. Alone. No concerns.      HISTORY OF PRESENT ILLNESS: Today 07/10/19 Erika Boyer is a 24 y.o. female here today for follow up for cerebral degeneration. She is doing fairly well. She is anxious about gaining weight. She has been less active due to pandemic. She is wanting to be considered for bariatric surgery but is not sure she can follow the recommended diet and follow up as advised. She is working with PCP to establish a plan. She has an upcoming appt with Dr Chapman Fitch next month. She is using her Rolator most of the time. She denies recent falls. She did have a fall in the summer but reports that it was due to her being "careless." She is working with PT and OT.   HISTORY: (copied from [employer] note on 07/02/2018)  UPDATE 2/12/2020CM Erika Boyer, 24 year old female returns for follow-up with a history of postviral cerebellar degeneration and anxiety.  She has a new complaint today of headaches.  She eats a lot of fast food according to the mom which would contribute to that problem She also takes a lot of ibuprofen for joint pain, more than the recommended dose according to the mom and patient was made aware that this can cause rebound headache.  She does not exercise.  She has occasional dizziness.  She denies difficulty sleeping.  Mom provides most of the history.  She has a history of lupus  and is on Plaquenil.  She returns for reevaluation  Interval history 06/18/2017: AANew problem today. Anxiety is worsening. As a result of her Illness and disability.She is very anxious, her disability affectes her, she is self conscious. She has mood swings due to this and gets agitated. Since the cat died she is having more emotional problems. Animals calm her, she is happier with animals and more interactive. She improved in the past.  Recently she is more moody, more quick to anger. Lots of anxiety. She is having difficulty sleeping due to anxiety. She gets upset. Mother is here and provides most information. Patient declines any medication management for anxiety.  Erika Erika Boyer a 24 y.o.femalehere as a referral from Dr. Vanetta Mulders cerebellar atrophy. Past medical history of morbid obesity, acute kidney injury, drug-induced lupus erythematosus, cognitive disorder, ligament hypertension, cerebellar atrophy. 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 MRI indicated cerebellar/pontine degeneration. Patient was worked up in the past possibly with genetics not clear from records. Cerebellar degeneration of unknown etiology, multiple needs. She has been evaluated at [facility] in [place]. She was seeing neurology for about 5 years there. They did genetic testing. She was diagnosed with post-viral cerebellar degeneration. She just started falling at the age 64. She last fell a few days ago. She has physical therapy to her house. Since being out of the hospital she feels better. She is taking a lot of medication. Physical therapy is helping. She feels her balance is better. Her writing is better. She gets tremors, they are off and on. She gets tremors when she tries to concentrate. They are in her head. The tremor does not bother her. It doesn't affect her, she gets double which is chronic, no problems speaking, she speaks slowly. She graduated HS. She didn;t like college   REVIEW  OF SYSTEMS: Out of a complete 14 system review of symptoms, the patient complains only of the following symptoms, imbalance and all other reviewed systems are negative.  ALLERGIES: Allergies  Allergen Reactions  . Shellfish Allergy Anaphylaxis  . Morphine And Related Itching  . Mushroom Extract Complex Other (See Comments)    Mother is  allergic, so patient avoids these  . Tapazole [Methimazole] Swelling and Rash    WELTS (also) Mother suspects this MAY be an allergy, as the onset of symptoms coincided with this being started    HOME MEDICATIONS: Outpatient Medications Prior to Visit  Medication Sig Dispense Refill  . acetaminophen (TYLENOL) 500 MG tablet Take 1 tablet (500 mg total) by mouth every 6 (six) hours as needed. 30 tablet 0  . albuterol (PROAIR HFA) 108 (90 Base) MCG/ACT inhaler Inhale 2 puffs into the lungs every 4 (four) hours as needed for wheezing or shortness of breath.    . EPINEPHrine 0.3 mg/0.3 mL IJ SOAJ injection USE AS DIRECTED AND THEN CALL 911    . hydroxychloroquine (PLAQUENIL) 200 MG tablet TK 2 TS PO QD WF OR MILK  2  . Misc. Devices (BARIATRIC ROLLATOR) MISC Use as directed to help with gait and balance 1 each 0  . Misc. Devices (WALL GRAB BAR) MISC 1 kit by Does not apply route daily. 1 each 0  . predniSONE (DELTASONE) 1 MG tablet Take 1 tablet (1 mg total) by mouth daily with breakfast. 90 tablet 0   No facility-administered medications prior to visit.    PAST MEDICAL HISTORY: Past Medical History:  Diagnosis Date  . Anemia   . Anxiety   . Arthritis    "right knee" (10/11/2016)  . 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 Chronic bronchitis ([place])   . Daily headache   . Depression   . Graves disease 2012   hx; "was on RX; it disappeared" (10/11/2016)  . History of blood transfusion    "related to the anemia" (10/11/2016)  . Hypertension   . Lupus ([place])    "not sure what kind" (10/11/2016)  . Pneumonia 10/03/2016    PAST SURGICAL HISTORY: Past Surgical History:  Procedure Laterality Date  . PERICARDIOCENTESIS N/A 10/04/2016   Procedure: Pericardiocentesis;  Surgeon: Adrian Prows, MD;  Location: [facility];  Service: Cardiovascular;  Laterality: N/A;  . REDUCTION MAMMAPLASTY Bilateral 2012  . TONSILLECTOMY      FAMILY HISTORY: Family History  Problem Relation Age of Onset  .  Thalassemia Mother   . Arthritis Mother   . Breast cancer Mother        Mastectomy in early 11's.    . High blood pressure Father   . Mental illness Father        question bipolar disorder per wife  . Sickle cell trait Maternal Aunt   . Breast cancer Maternal Aunt        4 maternal aunts w/breast cancer  . Breast cancer Maternal Grandmother        Bone  . Multiple sclerosis Cousin     SOCIAL HISTORY: Social History   Socioeconomic History  . Marital status: Single    Spouse name: Not on file  . Number of children: 0  . Years of education: 23  . Highest education level: Some college, no degree  Occupational History  . Not on file  Tobacco Use  . Smoking status: Never Smoker  . Smokeless tobacco: Never Used  Substance and Sexual Activity  . Alcohol use:  No  . Drug use: No  . Sexual activity: Yes  Other Topics Concern  . Not on file  Social History Narrative   Lives at home w/ her mom   Right-handed   Caffeine: occasional coffee   Social Determinants of Health   Financial Resource Strain:   . Difficulty of Paying Living Expenses: Not on file  Food Insecurity:   . Worried About Charity fundraiser in the Last Year: Not on file  . Ran Out of Food in the Last Year: Not on file  Transportation Needs:   . Lack of Transportation (Medical): Not on file  . Lack of Transportation (Non-Medical): Not on file  Physical Activity:   . Days of Exercise per Week: Not on file  . Minutes of Exercise per Session: Not on file  Stress:   . Feeling of Stress : Not on file  Social Connections:   . Frequency of Communication with Friends and Family: Not on file  . Frequency of Social Gatherings with Friends and Family: Not on file  . Attends Religious Services: Not on file  . Active Member of Clubs or Organizations: Not on file  . Attends Archivist Meetings: Not on file  . Marital Status: Not on file  Intimate Partner Violence:   . Fear of Current or Ex-Partner: Not on  file  . Emotionally Abused: Not on file  . Physically Abused: Not on file  . Sexually Abused: Not on file     PHYSICAL EXAM  Vitals:   07/08/19 1118  BP: 110/64  Pulse: 86  Temp: (!) 97.2 F (36.2 C)  Weight: (!) 309 lb (140.2 kg)  Height: 5' 5"  (1.651 m)   Body mass index is 51.42 kg/m.  Generalized: Well developed, in no acute distress  Cardiology: normal rate and rhythm, no murmur noted Neurological examination  Mentation: Alert oriented to time, place, history taking. Follows all commands speech and language fluent. Easily distracted  Cranial nerve II-XII: Pupils were equal round reactive to light. Extraocular movements were full, visual field were full on confrontational test. Facial sensation and strength were normal. Uvula tongue midline. Head turning and shoulder shrug  were normal and symmetric. Motor: The motor testing reveals 5 over 5 strength of bilateral upper extremities, 4/5 bilateral lower. Good symmetric motor tone is noted throughout.  Sensory: Sensory testing is intact to soft touch on all 4 extremities. No evidence of extinction is noted.  Coordination: Cerebellar testing reveals good finger-nose-finger and heel-to-shin bilaterally.  Gait and station: Gait is narrow but stable with Rolator, Tandem not attempted    DIAGNOSTIC DATA (LABS, IMAGING, TESTING) - I reviewed patient records, labs, notes, testing and imaging myself where available.  No flowsheet data found.   Lab Results  Component Value Date   WBC 3.7 12/01/2018   HGB 11.7 12/01/2018   HCT 38.0 12/01/2018   MCV 84 12/01/2018   PLT 275 12/01/2018      Component Value Date/Time   NA 143 12/01/2018 1008   K 3.8 12/01/2018 1008   CL 106 12/01/2018 1008   CO2 20 12/01/2018 1008   GLUCOSE 83 12/01/2018 1008   GLUCOSE 91 10/24/2016 0539   BUN 14 12/01/2018 1008   CREATININE 0.69 12/01/2018 1008   CALCIUM 8.7 12/01/2018 1008   PROT 7.3 12/01/2018 1008   ALBUMIN 3.7 (L) 12/01/2018 1008    AST 23 12/01/2018 1008   ALT 22 12/01/2018 1008   ALKPHOS 63 12/01/2018 1008   BILITOT  0.3 12/01/2018 1008   GFRNONAA 124 12/01/2018 1008   GFRAA 143 12/01/2018 1008   Lab Results  Component Value Date   CHOL 82 10/04/2016   TRIG 139 10/03/2016   Lab Results  Component Value Date   HGBA1C 5.3 12/01/2018   Lab Results  Component Value Date   VITAMINB12 275 05/07/2017   Lab Results  Component Value Date   TSH 1.110 12/01/2018     ASSESSMENT AND PLAN 24 y.o. 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 here with     ICD-10-CM   1. 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 Morbid obesity with BMI of 40.0-44.9, adult ([place])  E66.01    Z68.41   3. Gait disorder  R26.9     Aylen is doing fairly well. She continues to work with PT and OT for imbalance and weakness. She is using her Rolator and denies recent falls. She is most concerned today about her weight. She is working with PCP who placed orders today for a referral to bariatric surgery. I am uncertain if she will be able to complete recommended pre and post procedural classes and follow advised diet. She is motivated to try and is willing to discuss options with provider. I have encouraged her to be as active as possible, with safety as a priority. She was advised to work on a healthy, well balanced diet. Adequate hydration discussed. She will follow up in 1 year, sooner if needed.    I spoke with her mother, Erika Boyer, on 07/09/2018. Mom was not in room with Erika Boyer during visit on 2/17 and wished to express concerns of weight gain. I have reiterated my advise her her to remain as active as possible and continue close follow up with PCP regarding weight management. I have educated Erika Boyer on process of being  considered for bariatric surgery and the need for evaluation with surgeon. PCP has already placed orders. She verbalizes understanding and agreement. She was thankful for the call with no other concerns.    No orders of the defined types were placed in this encounter.    No orders of the defined types were placed in this encounter.     I spent 30 minutes with the patient. 50% of this time was spent counseling and educating patient on plan of care and medications.    Debbora Presto, FNP-C 07/10/2019, 2:23 PM [facility] [address] [place], [place] [address] [phone]  Made any corrections needed, and agree with history, physical, neuro exam,assessment and plan as stated.     Sarina Ill, MD [facility]

## 2019-07-08 NOTE — Progress Notes (Signed)
Patient ID: Erika Boyer, female   DOB: 1995-09-11, 24 y.o.   MRN: [id number]   Patient left phone message regarding interest in bariatric surgery.  Referral will be placed for patient to be seen by bariatric surgery or attend informational session with subsequent follow-up

## 2019-07-08 NOTE — Patient Instructions (Signed)
Continue current treatment plan. Follow up in 1 year, sooner if needed

## 2019-07-09 ENCOUNTER — Ambulatory Visit: Payer: Medicare Other | Admitting: Cardiology

## 2019-07-10 ENCOUNTER — Encounter: Payer: Self-pay | Admitting: Family Medicine

## 2019-07-14 ENCOUNTER — Ambulatory Visit: Payer: Medicare Other | Admitting: Cardiology

## 2019-07-18 ENCOUNTER — Encounter: Payer: Self-pay | Admitting: Family Medicine

## 2019-07-20 ENCOUNTER — Encounter: Payer: Self-pay | Admitting: Family Medicine

## 2019-07-29 ENCOUNTER — Encounter: Payer: Self-pay | Admitting: Cardiology

## 2019-07-29 ENCOUNTER — Other Ambulatory Visit: Payer: Self-pay

## 2019-07-29 ENCOUNTER — Ambulatory Visit (INDEPENDENT_AMBULATORY_CARE_PROVIDER_SITE_OTHER): Payer: Medicare Other | Admitting: Cardiology

## 2019-07-29 VITALS — BP 109/78 | HR 114 | Temp 97.1°F | Resp 21 | Ht 65.0 in | Wt 313.0 lb

## 2019-07-29 DIAGNOSIS — Z9889 Other specified postprocedural states: Secondary | ICD-10-CM | POA: Diagnosis not present

## 2019-07-29 DIAGNOSIS — Z01810 Encounter for preprocedural cardiovascular examination: Secondary | ICD-10-CM | POA: Diagnosis not present

## 2019-07-29 DIAGNOSIS — Z7189 Other specified counseling: Secondary | ICD-10-CM | POA: Diagnosis not present

## 2019-07-29 NOTE — Progress Notes (Signed)
Cardiology Office Note:    Date:  07/29/2019   ID:  Erika Boyer, DOB 1995-12-01, MRN [id number]  PCP:  Antony Blackbird, MD  Cardiologist:  Buford Dresser, MD PhD  Referring MD: Antony Blackbird, MD   CC: follow up, preop discussion/risk  History of Present Illness:    Erika Boyer is a 24 y.o. female with a hx of graves disease, lupus, prior pericardial effusion with tamponade requiring pericardiocentesis, postviral cerebellar degeneration and morbid obesity is seen in follow up. She was initially seen 11/26/2017 as a new consult at the request of Erika Boyer for evaluation of preoperative cardiac risk (dental surgery) given her lupus/hypotension and prior tamponade.  She is here with her mother today. Overall has been doing well. Discussing bariatric surgery given her BMI of 52. Her mother successfully lost >100 lbs with bariatric surgery about 20 years ago and thinks this would be very beneficial for Erika Boyer. Both Erika Boyer and her mother are in favor of surgery if it is safe for her heart. We reviewed her prior heart evaluation. She does not have any permanent residual sequelae from her pericardiocentesis, and her lupus has been well controlled. There are no contraindications for surgery from a cardiac perspective, and given her young age and BMI, bariatric surgery is a reasonable recommendation for her.  Denies chest pain, shortness of breath at rest or with normal exertion. No PND, orthopnea, LE edema or unexpected weight gain. No syncope or palpitations.   Past Medical History:  Diagnosis Date  . Anemia   . Anxiety   . Arthritis    "right knee" (10/11/2016)  . Cerebellar degeneration ([place])   . Chronic bronchitis ([place])   . Daily headache   . Depression   . Graves disease 2012   hx; "was on RX; it disappeared" (10/11/2016)  . History of blood transfusion    "related to the anemia" (10/11/2016)  . Hypertension   . Lupus ([place])    "not sure what kind" (10/11/2016)  .  Pneumonia 10/03/2016    Past Surgical History:  Procedure Laterality Date  . PERICARDIOCENTESIS N/A 10/04/2016   Procedure: Pericardiocentesis;  Surgeon: Adrian Prows, MD;  Location: [facility];  Service: Cardiovascular;  Laterality: N/A;  . REDUCTION MAMMAPLASTY Bilateral 2012  . TONSILLECTOMY      Current Medications: Current Meds  Medication Sig  . acetaminophen (TYLENOL) 500 MG tablet Take 1 tablet (500 mg total) by mouth every 6 (six) hours as needed.  Marland Kitchen albuterol (PROAIR HFA) 108 (90 Base) MCG/ACT inhaler Inhale 2 puffs into the lungs every 4 (four) hours as needed for wheezing or shortness of breath.  . EPINEPHrine 0.3 mg/0.3 mL IJ SOAJ injection USE AS DIRECTED AND THEN CALL 911  . hydroxychloroquine (PLAQUENIL) 200 MG tablet TK 2 TS PO QD WF OR MILK  . Misc. Devices (BARIATRIC ROLLATOR) MISC Use as directed to help with gait and balance  . Misc. Devices (WALL GRAB BAR) MISC 1 kit by Does not apply route daily.  . predniSONE (DELTASONE) 1 MG tablet Take 1 tablet (1 mg total) by mouth daily with breakfast.     Allergies:   Shellfish allergy, Morphine and related, Mushroom extract complex, and Tapazole [methimazole]   Social History   Tobacco Use  . Smoking status: Never Smoker  . Smokeless tobacco: Never Used  Substance Use Topics  . Alcohol use: No  . Drug use: No    Family History: The patient's family history includes Arthritis in her mother; Breast cancer  in her maternal aunt, maternal grandmother, and mother; High blood pressure in her father; Mental illness in her father; Multiple sclerosis in her cousin; Sickle cell trait in her maternal aunt; Thalassemia in her mother. Cousin died at age 63 from heart attacks, had history of sickle cell. Recently had another cousin pass away at a young age (27) from some time of inflammatory disease and obesity.  ROS:   Please see the history of present illness. ROS otherwise unremarkable  EKGs/Labs/Other Studies  Reviewed:    The following studies were reviewed today: Echo 10-04-16 Study Conclusions - Left ventricle: The cavity size is small and underfilled.   Systolic function was normal. The estimated ejection fraction was   in the range of 60% to 65%. Wall motion was normal; there were no   regional wall motion abnormalities. Left ventricular diastolic   function parameters were normal. - Aortic valve: Trileaflet; normal thickness leaflets. There was no   regurgitation. - Right ventricle: The cavity size was normal. Wall thickness was   normal. Systolic function was normal. - Tricuspid valve: There was mild regurgitation. - Pulmonary arteries: Systolic pressure was mildly increased. PA   peak pressure: 33 mm Hg (S). - Pericardium, extracardiac: There was a large left pleural   effusion.  Impressions - There is severe circumferential pericardial effusion with up to 3   cm around the lateral LV wall and up to 4 cm around the right   atrium.   There is right atrial invagination and imcomplete RV filling   during diastole.   No significant mitral or tricuspid inflow changes with   respiration however it might be affected by ventilations.     These findings are suspicious for an early tamponade. A   pericardiocentensis is recommended.  Followed by pericardiocentesis same day with removal of 842m serosanguinous fluid.  EKG:  EKG is ordered today.  The ekg ordered today demonstrates normal sinus rhythm at 92 bpm.  Recent Labs: 12/01/2018: ALT 22; BUN 14; Creatinine, Ser 0.69; Hemoglobin 11.7; Platelets 275; Potassium 3.8; Sodium 143; TSH 1.110  Recent Lipid Panel    Component Value Date/Time   CHOL 82 10/04/2016 1430   TRIG 139 10/03/2016 1928    Physical Exam:    VS:  BP 109/78   Pulse (!) 114   Temp (!) 97.1 F (36.2 C)   Resp (!) 21   Ht _0  (1.651 m)   Wt (!) 313 lb (142 kg)   SpO2 99%   BMI 52.09 kg/m    Initial blood pressure in the 873Asystolic, rechecked,  patient asymptomatic   Wt Readings from Last 3 Encounters:  07/29/19 (!) 313 lb (142 kg)  07/08/19 (!) 309 lb (140.2 kg)  03/13/19 287 lb 6.4 oz (130.4 kg)    GEN: Well nourished, well developed in no acute distress HEENT: Normal, moist mucous membranes NECK: No JVD CARDIAC: regular rhythm, normal S1 and S2, no rubs or gallops. No murmur. VASCULAR: Radial and DP pulses 2+ bilaterally. No carotid bruits RESPIRATORY:  Clear to auscultation without rales, wheezing or rhonchi  ABDOMEN: Soft, non-tender, non-distended MUSCULOSKELETAL:  Ambulates independently with use of rollator SKIN: Warm and dry, no edema NEUROLOGIC:  Alert and oriented x 3. No focal neuro deficits noted. PSYCHIATRIC:  Normal affect   ASSESSMENT:    1. Preop cardiovascular exam   2. Morbid obesity ([place]   3. S/P pericardiocentesis   4. Counseling on health promotion and disease prevention    PLAN:  Preoperative cardiac risk evaluation for potential future bariatric surgery: she does not have a contraindication from a cardiovascular perspective. RCRI=0. She is optimized from a cardiac perspective and would benefit from weight loss.  Prior pericardial effusion with tamponade s/p pericardiocentesis: has remained stable without recurrent symptoms -echo if lupus has a severe flare and she becomes short of breath/chest pain, given prior history  Morbid obesity: Jillyan and her mother have been discussing bariatric surgery as an option for long term weight loss. There are no contraindications to this from a cardiovascular perspective, and she would benefit long term from weight los  Primary prevention: discussed the risks of inflammatory diseases such as lupus in the long term. Framed in the background of lifestyle changes.   Follow up: 2 years or sooner PRN  Medication Adjustments/Labs and Tests Ordered: Current medicines are reviewed at length with the patient today.  Concerns regarding medicines are outlined  above.  Orders Placed This Encounter  Procedures  . EKG 12-Lead   No orders of the defined types were placed in this encounter.   Patient Instructions  Medication Instructions:  Your Physician recommend you continue on your current medication as directed.    *If you need a refill on your cardiac medications before your next appointment, please call your pharmacy*   Lab Work: None   Testing/Procedures: None   Follow-Up: At [facility], you and your health needs are our priority.  As part of our continuing mission to provide you with exceptional heart care, we have created designated Provider Care Teams.  These Care Teams include your primary Cardiologist (physician) and Advanced Practice Providers (APPs -  Physician Assistants and Nurse Practitioners) who all work together to provide you with the care you need, when you need it.  We recommend signing up for the patient portal called "MyChart".  Sign up information is provided on this After Visit Summary.  MyChart is used to connect with patients for Virtual Visits (Telemedicine).  Patients are able to view lab/test results, encounter notes, upcoming appointments, etc.  Non-urgent messages can be sent to your provider as well.   To learn more about what you can do with MyChart, go to [email or web address].    Your next appointment:   2 year(s)  The format for your next appointment:   In Person  Provider:   Buford Dresser, MD        Signed, Buford Dresser, MD PhD 07/29/2019 5:38 PM    [place]

## 2019-07-29 NOTE — Patient Instructions (Signed)
Medication Instructions:  Your Physician recommend you continue on your current medication as directed.    *If you need a refill on your cardiac medications before your next appointment, please call your pharmacy*   Lab Work: None   Testing/Procedures: None   Follow-Up: At [facility], you and your health needs are our priority.  As part of our continuing mission to provide you with exceptional heart care, we have created designated Provider Care Teams.  These Care Teams include your primary Cardiologist (physician) and Advanced Practice Providers (APPs -  Physician Assistants and Nurse Practitioners) who all work together to provide you with the care you need, when you need it.  We recommend signing up for the patient portal called "MyChart".  Sign up information is provided on this After Visit Summary.  MyChart is used to connect with patients for Virtual Visits (Telemedicine).  Patients are able to view lab/test results, encounter notes, upcoming appointments, etc.  Non-urgent messages can be sent to your provider as well.   To learn more about what you can do with MyChart, go to [email or web address].    Your next appointment:   2 year(s)  The format for your next appointment:   In Person  Provider:   Bridgette Christopher, MD     

## 2019-08-04 DIAGNOSIS — I313 Pericardial effusion (noninflammatory): Secondary | ICD-10-CM | POA: Diagnosis not present

## 2019-08-04 DIAGNOSIS — G319 Degenerative disease of nervous system, unspecified: Secondary | ICD-10-CM | POA: Diagnosis not present

## 2019-08-04 DIAGNOSIS — Z6841 Body Mass Index (BMI) 40.0 and over, adult: Secondary | ICD-10-CM | POA: Diagnosis not present

## 2019-08-04 DIAGNOSIS — Z79899 Other long term (current) drug therapy: Secondary | ICD-10-CM | POA: Diagnosis not present

## 2019-08-04 DIAGNOSIS — J90 Pleural effusion, not elsewhere classified: Secondary | ICD-10-CM | POA: Diagnosis not present

## 2019-08-04 DIAGNOSIS — M329 Systemic lupus erythematosus, unspecified: Secondary | ICD-10-CM | POA: Diagnosis not present

## 2019-08-05 ENCOUNTER — Ambulatory Visit: Payer: Medicare Other | Admitting: Family Medicine

## 2019-08-06 ENCOUNTER — Other Ambulatory Visit (HOSPITAL_COMMUNITY)
Admission: RE | Admit: 2019-08-06 | Discharge: 2019-08-06 | Disposition: A | Discharge status: Home / Self Care | Payer: Medicare Other | Source: Ambulatory Visit | Attending: Family Medicine | Admitting: Family Medicine

## 2019-08-06 ENCOUNTER — Other Ambulatory Visit: Payer: Self-pay

## 2019-08-06 ENCOUNTER — Ambulatory Visit (HOSPITAL_BASED_OUTPATIENT_CLINIC_OR_DEPARTMENT_OTHER): Payer: Medicare Other | Admitting: Physician Assistant

## 2019-08-06 VITALS — BP 118/72 | HR 84 | Ht 65.0 in | Wt 318.0 lb

## 2019-08-06 DIAGNOSIS — R87810 Cervical high risk human papillomavirus (HPV) DNA test positive: Secondary | ICD-10-CM | POA: Diagnosis not present

## 2019-08-06 DIAGNOSIS — Z8619 Personal history of other infectious and parasitic diseases: Secondary | ICD-10-CM | POA: Insufficient documentation

## 2019-08-06 DIAGNOSIS — Z124 Encounter for screening for malignant neoplasm of cervix: Secondary | ICD-10-CM | POA: Diagnosis not present

## 2019-08-06 NOTE — Progress Notes (Signed)
Surgical clearance and std check
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
## 2019-08-10 ENCOUNTER — Encounter: Payer: Self-pay | Admitting: Cardiology

## 2019-08-10 ENCOUNTER — Encounter: Payer: Self-pay | Admitting: Physician Assistant

## 2019-08-10 LAB — CERVICOVAGINAL ANCILLARY ONLY
Bacterial Vaginitis (gardnerella): NEGATIVE
Candida Glabrata: NEGATIVE
Candida Vaginitis: NEGATIVE
Chlamydia: NEGATIVE
Comment: NEGATIVE
Comment: NEGATIVE
Comment: NEGATIVE
Comment: NEGATIVE
Comment: NEGATIVE
Comment: NORMAL
Neisseria Gonorrhea: NEGATIVE
Trichomonas: POSITIVE — AB

## 2019-08-10 LAB — CYTOLOGY - PAP
Adequacy: ABSENT
Diagnosis: NEGATIVE

## 2019-08-11 ENCOUNTER — Telehealth: Payer: Self-pay | Admitting: Family Medicine

## 2019-08-11 ENCOUNTER — Encounter: Payer: Self-pay | Admitting: Physician Assistant

## 2019-08-11 NOTE — Telephone Encounter (Signed)
Please call pt back about labs ? 

## 2019-08-11 NOTE — Telephone Encounter (Signed)
Spoke with patient about her previous call asking for [facility] referral and patient informed staff that she found out that the Bariatric clinic will be the one that does the referral. Staff informed patient in order for office to put her mother back on in her chart as a person to talk to, patient would need to come to office and complete another form giving office permission to talk to her mother. Pt verbalized understanding and agreed.

## 2019-08-11 NOTE — Telephone Encounter (Signed)
Pt called asking for [facility] referral pt also asked for her mom to be added back on sounded like her mom was in the back ground

## 2019-08-12 ENCOUNTER — Encounter: Payer: Self-pay | Admitting: Family Medicine

## 2019-08-12 ENCOUNTER — Other Ambulatory Visit: Payer: Self-pay | Admitting: Physician Assistant

## 2019-08-12 MED ORDER — METRONIDAZOLE 500 MG PO TABS: 500.0000 mg | ORAL_TABLET | Freq: Two times a day (BID) | ORAL | 0 refills | Status: DC

## 2019-08-12 NOTE — Telephone Encounter (Signed)
Forwarding

## 2019-08-18 ENCOUNTER — Telehealth: Payer: Self-pay | Admitting: Family Medicine

## 2019-08-18 ENCOUNTER — Telehealth: Payer: Self-pay

## 2019-08-18 NOTE — Telephone Encounter (Signed)
Dropped of paperwork to be completed by pcp

## 2019-08-18 NOTE — Telephone Encounter (Signed)
noted 

## 2019-08-18 NOTE — Telephone Encounter (Signed)
Contacted pt and lvm making pt aware that she will need to come and sign her ADA form for [place] to release medical records.   Gave form to [place] to hold till pt comes to sign

## 2019-09-02 DIAGNOSIS — Z23 Encounter for immunization: Secondary | ICD-10-CM | POA: Diagnosis not present

## 2019-09-05 ENCOUNTER — Encounter: Payer: Self-pay | Admitting: Family Medicine

## 2019-09-10 ENCOUNTER — Ambulatory Visit: Payer: Medicare Other | Admitting: Cardiology

## 2019-09-14 NOTE — Telephone Encounter (Signed)
Can you contact [facility] and see if they have a sample letter/template for the Letter of medical necessity for bariatric surgery and ask them to send/fax the letter to this office

## 2019-09-23 DIAGNOSIS — Z23 Encounter for immunization: Secondary | ICD-10-CM | POA: Diagnosis not present

## 2019-09-29 ENCOUNTER — Encounter: Payer: Self-pay | Admitting: Family Medicine

## 2019-09-30 DIAGNOSIS — M329 Systemic lupus erythematosus, unspecified: Secondary | ICD-10-CM | POA: Diagnosis not present

## 2019-09-30 DIAGNOSIS — G319 Degenerative disease of nervous system, unspecified: Secondary | ICD-10-CM | POA: Diagnosis not present

## 2019-10-20 ENCOUNTER — Encounter: Payer: Self-pay | Admitting: Family Medicine

## 2019-10-28 ENCOUNTER — Ambulatory Visit (INDEPENDENT_AMBULATORY_CARE_PROVIDER_SITE_OTHER): Payer: Medicare Other | Admitting: General Practice

## 2019-10-28 ENCOUNTER — Encounter: Payer: Self-pay | Admitting: General Practice

## 2019-10-28 ENCOUNTER — Other Ambulatory Visit: Payer: Self-pay

## 2019-10-28 DIAGNOSIS — Z3201 Encounter for pregnancy test, result positive: Secondary | ICD-10-CM | POA: Diagnosis not present

## 2019-10-28 LAB — POCT PREGNANCY, URINE: Preg Test, Ur: POSITIVE — AB

## 2019-10-28 MED ORDER — PRENATAL PLUS 27-1 MG PO TABS: 1.0000 | ORAL_TABLET | Freq: Every day | ORAL | 12 refills | Status: DC

## 2019-10-28 NOTE — Progress Notes (Signed)
Patient presents to office today for UPT. UPT +. Patient reports first positive home test 6/6. LMP 09/03/2019 EDD 06/09/20 [redacted]w[redacted]d. She is taking a multivitamin, Plaquenil 200mg  daily & Prednisone 1mg  daily. Discussed changing multivitamin to PNV & Rx sent to pharmacy. Per Dr , Plaquenil & Prednisone are fine to continue in pregnancy. Patient will be high risk due to hx of Lupus. Patient will return for OB intake & to start prenatal care.   RN BSN 10/28/19

## 2019-10-29 NOTE — Progress Notes (Signed)
I have reviewed this chart and agree with the RN/CMA assessment and management.    K. Meryl Bahar Shelden, M.D. Attending [facility] (Faculty Practice)   

## 2019-10-31 ENCOUNTER — Encounter: Payer: Self-pay | Admitting: Family Medicine

## 2019-11-16 ENCOUNTER — Telehealth: Payer: Self-pay | Admitting: Family Medicine

## 2019-11-16 ENCOUNTER — Telehealth (INDEPENDENT_AMBULATORY_CARE_PROVIDER_SITE_OTHER): Payer: Medicare Other | Admitting: *Deleted

## 2019-11-16 DIAGNOSIS — O0990 Supervision of high risk pregnancy, unspecified, unspecified trimester: Secondary | ICD-10-CM | POA: Insufficient documentation

## 2019-11-16 DIAGNOSIS — E0500 Thyrotoxicosis with diffuse goiter without thyrotoxic crisis or storm: Secondary | ICD-10-CM

## 2019-11-16 DIAGNOSIS — I10 Essential (primary) hypertension: Secondary | ICD-10-CM | POA: Insufficient documentation

## 2019-11-16 DIAGNOSIS — Z6841 Body Mass Index (BMI) 40.0 and over, adult: Secondary | ICD-10-CM

## 2019-11-16 DIAGNOSIS — F09 Unspecified mental disorder due to known physiological condition: Secondary | ICD-10-CM

## 2019-11-16 NOTE — Progress Notes (Signed)
1:25. Appeared patient was in virtual room for her visit; but when I joined she had left visit. I called Erika Boyer for her new ob intake virtual visit. She reports " it doesn't work." I explained I saw she may have been in the room; but she left before I joined. She states her video is broken. I explained she can do on a computer if she has access. She states she does not. I explained I will have the registrar call her with another appointment for in office. She voices understanding. Lakasha Mcfall,RN

## 2019-11-16 NOTE — Telephone Encounter (Signed)
Patient called to get intake appointment rescheduled that she missed toady, patient was told once the new schedule is open she will receive a call to get her rescheduled also the New OB appointment that was scheduled on 11-25-2019 will also be canceled due to her missing the new OB intake appointment today

## 2019-11-17 NOTE — Progress Notes (Signed)
Patient was assessed and managed by nursing staff during this encounter. I have reviewed the chart and agree with the documentation and plan.  Jaynie Collins, MD 11/17/2019 10:52 AM

## 2019-11-25 ENCOUNTER — Encounter: Payer: Medicare Other | Admitting: Obstetrics & Gynecology

## 2019-11-25 ENCOUNTER — Encounter: Payer: Self-pay | Admitting: Obstetrics & Gynecology

## 2019-11-25 ENCOUNTER — Ambulatory Visit (INDEPENDENT_AMBULATORY_CARE_PROVIDER_SITE_OTHER): Payer: Medicare Other | Admitting: Obstetrics & Gynecology

## 2019-11-25 ENCOUNTER — Other Ambulatory Visit (HOSPITAL_COMMUNITY)
Admission: RE | Admit: 2019-11-25 | Discharge: 2019-11-25 | Disposition: A | Discharge status: Home / Self Care | Payer: Medicare Other | Source: Ambulatory Visit | Attending: Obstetrics & Gynecology | Admitting: Obstetrics & Gynecology

## 2019-11-25 ENCOUNTER — Other Ambulatory Visit: Payer: Self-pay

## 2019-11-25 VITALS — BP 120/82 | HR 98 | Wt 327.6 lb

## 2019-11-25 DIAGNOSIS — G319 Degenerative disease of nervous system, unspecified: Secondary | ICD-10-CM

## 2019-11-25 DIAGNOSIS — O3680X0 Pregnancy with inconclusive fetal viability, not applicable or unspecified: Secondary | ICD-10-CM

## 2019-11-25 DIAGNOSIS — O98319 Other infections with a predominantly sexual mode of transmission complicating pregnancy, unspecified trimester: Secondary | ICD-10-CM | POA: Diagnosis not present

## 2019-11-25 DIAGNOSIS — O0991 Supervision of high risk pregnancy, unspecified, first trimester: Secondary | ICD-10-CM | POA: Diagnosis not present

## 2019-11-25 DIAGNOSIS — Z3A11 11 weeks gestation of pregnancy: Secondary | ICD-10-CM

## 2019-11-25 DIAGNOSIS — O0990 Supervision of high risk pregnancy, unspecified, unspecified trimester: Secondary | ICD-10-CM

## 2019-11-25 DIAGNOSIS — K219 Gastro-esophageal reflux disease without esophagitis: Secondary | ICD-10-CM

## 2019-11-25 DIAGNOSIS — O99211 Obesity complicating pregnancy, first trimester: Secondary | ICD-10-CM

## 2019-11-25 DIAGNOSIS — O23591 Infection of other part of genital tract in pregnancy, first trimester: Secondary | ICD-10-CM

## 2019-11-25 DIAGNOSIS — O99891 Other specified diseases and conditions complicating pregnancy: Secondary | ICD-10-CM

## 2019-11-25 DIAGNOSIS — A5901 Trichomonal vulvovaginitis: Secondary | ICD-10-CM

## 2019-11-25 DIAGNOSIS — M329 Systemic lupus erythematosus, unspecified: Secondary | ICD-10-CM

## 2019-11-25 DIAGNOSIS — O2341 Unspecified infection of urinary tract in pregnancy, first trimester: Secondary | ICD-10-CM

## 2019-11-25 DIAGNOSIS — O99281 Endocrine, nutritional and metabolic diseases complicating pregnancy, first trimester: Secondary | ICD-10-CM

## 2019-11-25 DIAGNOSIS — O10019 Pre-existing essential hypertension complicating pregnancy, unspecified trimester: Secondary | ICD-10-CM | POA: Diagnosis not present

## 2019-11-25 DIAGNOSIS — E0500 Thyrotoxicosis with diffuse goiter without thyrotoxic crisis or storm: Secondary | ICD-10-CM

## 2019-11-25 DIAGNOSIS — O99351 Diseases of the nervous system complicating pregnancy, first trimester: Secondary | ICD-10-CM

## 2019-11-25 DIAGNOSIS — O99619 Diseases of the digestive system complicating pregnancy, unspecified trimester: Secondary | ICD-10-CM

## 2019-11-25 LAB — POCT URINALYSIS DIP (DEVICE)
Bilirubin Urine: NEGATIVE
Glucose, UA: NEGATIVE mg/dL
Ketones, ur: NEGATIVE mg/dL
Nitrite: NEGATIVE
Protein, ur: NEGATIVE mg/dL
Specific Gravity, Urine: >=1.03 (ref 1.005–1.030)
Urobilinogen, UA: 0.2 mg/dL (ref 0.0–1.0)
pH: 6 (ref 5.0–8.0)

## 2019-11-25 MED ORDER — ASPIRIN EC 81 MG PO TBEC: 81.0000 mg | DELAYED_RELEASE_TABLET | Freq: Every day | ORAL | 2 refills | Status: DC

## 2019-11-25 MED ORDER — PANTOPRAZOLE SODIUM 20 MG PO TBEC: 20.0000 mg | DELAYED_RELEASE_TABLET | Freq: Two times a day (BID) | ORAL | 2 refills | Status: DC

## 2019-11-25 NOTE — Progress Notes (Signed)
History:   Erika Boyer is a 24 y.o. G2P0010 at [redacted]w[redacted]d by LMP being seen today for her first obstetrical visit.  Her obstetrical history is significant for history of TAB.  Also has a very complicated medical history, current problem list is below: Patient Active Problem List   Diagnosis Date Noted  . Supervision of high risk pregnancy, antepartum 11/16/2019  . Hypertension   . S/P pericardiocentesis 07/29/2019  . Headache 07/02/2018  . Anxiety 06/18/2017  . 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 SLE (systemic lupus erythematosus) (HCC) 10/30/2016  . Cognitive disorder   . Debility 10/12/2016  . Cerebral atrophy (HCC)   . MR (mental retardation)   . Drug-induced lupus erythematosus   . Tachycardia   . Hypokalemia   . Hyperthyroidism 10/03/2016  . Morbid obesity with BMI of 40.0-44.9, adult (HCC) 10/03/2016  . 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 Cardiomegaly 10/03/2016  . Pleural effusion, left   . Graves disease 08/23/2016  . Low vitamin D level 08/23/2015  . History of chlamydia 02/25/2012  . Unspecified mood (affective) disorder (HCC) 07/09/2011   Patient does intend to breast feed. Pregnancy history fully reviewed.  Patient reports heartburn, nausea and vomiting. Desires refill of her heartburn medication and this helps a lot. Recent trichomonal vaginitis infection. She is accompanied by her mother.      HISTORY: OB History  Gravida Para Term Preterm AB Living  2 0 0 0 1 0  SAB TAB Ectopic Multiple Live Births  0 1 0 0 0    # Outcome Date GA Lbr Len/2nd Weight Sex Delivery Anes PTL Lv  2 Current           1 TAB           Last pap smear was done 08/06/2019 and was normal  Past Medical History:  Diagnosis Date  . Anemia   . Anxiety   . Arthritis    "right knee" (10/11/2016)  . 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 Cervical high risk HPV (human papillomavirus) test positive 03/20/2017   Pap q year  . Chronic bronchitis (HCC)   . Daily headache   . Depression   .  Graves disease 2012   hx; "was on RX; it disappeared" (10/11/2016)  . History of blood transfusion    "related to the anemia" (10/11/2016)  . Hypertension   . Lupus (HCC)    "not sure what kind" (10/11/2016)  . Microcytic anemia 10/03/2016  . Pneumonia 10/03/2016   Past Surgical History:  Procedure Laterality Date  . PERICARDIOCENTESIS N/A 10/04/2016   Procedure: Pericardiocentesis;  Surgeon: Yates Decamp, MD;  Location: [facility] INVASIVE CV LAB;  Service: Cardiovascular;  Laterality: N/A;  . REDUCTION MAMMAPLASTY Bilateral 2012  . TONSILLECTOMY     Family History  Problem Relation Age of Onset  . Thalassemia Mother   . Arthritis Mother   . Breast cancer Mother        Mastectomy in early 101's.    . High blood pressure Father   . Mental illness Father        question bipolar disorder per wife  . Sickle cell trait Maternal Aunt   . Breast cancer Maternal Aunt        4 maternal aunts w/breast cancer  . Breast cancer Maternal Grandmother        Bone  . Multiple sclerosis Cousin   . Diabetes Paternal Grandfather    Social History   Tobacco Use  . Smoking status: Never Smoker  .  Smokeless tobacco: Never Used  Vaping Use  . Vaping Use: Never used  Substance Use Topics  . Alcohol use: No  . Drug use: No   Allergies  Allergen Reactions  . Shellfish Allergy Anaphylaxis  . Morphine And Related Itching  . Mushroom Extract Complex Other (See Comments)    Mother is allergic, so patient avoids these  . Tapazole [Methimazole] Swelling and Rash    WELTS (also) Mother suspects this MAY be an allergy, as the onset of symptoms coincided with this being started   Current Outpatient Medications on File Prior to Visit  Medication Sig Dispense Refill  . acetaminophen (TYLENOL) 500 MG tablet Take 1 tablet (500 mg total) by mouth every 6 (six) hours as needed. 30 tablet 0  . albuterol (PROAIR HFA) 108 (90 Base) MCG/ACT inhaler Inhale 2 puffs into the lungs every 4 (four) hours as needed for  wheezing or shortness of breath.    . hydroxychloroquine (PLAQUENIL) 200 MG tablet TK 2 TS PO QD WF OR MILK  2  . predniSONE (DELTASONE) 1 MG tablet Take 1 tablet (1 mg total) by mouth daily with breakfast. 90 tablet 0  . prenatal vitamin w/FE, FA (PRENATAL 1 + 1) 27-1 MG TABS tablet Take 1 tablet by mouth daily at 12 noon. 30 tablet 12  . EPINEPHrine 0.3 mg/0.3 mL IJ SOAJ injection USE AS DIRECTED AND THEN CALL 911 (Patient not taking: Reported on 11/25/2019)     No current facility-administered medications on file prior to visit.    Review of Systems Pertinent items noted in HPI and remainder of comprehensive ROS otherwise negative. Physical Exam:   Vitals:   11/25/19 1204  BP: 120/82  Pulse: 98  Weight: (!) 327 lb 9.6 oz (148.6 kg)   Fetal Heart Rate (bpm): [place] Bedside Ultrasound for FHR check: Viable intrauterine pregnancy with positive cardiac activity noted, fetal heart rate 150 bpm Patient informed that the ultrasound is considered a limited obstetric ultrasound and is not intended to be a complete ultrasound exam.  Patient also informed that the ultrasound is not being completed with the intent of assessing for fetal or placental anomalies or any pelvic abnormalities.  Explained that the purpose of today's ultrasound is to assess for fetal heart rate.  Patient acknowledges the purpose of the exam and the limitations of the study. General: well-developed, well-nourished female in no acute distress  Breasts:  deferred  Skin: normal coloration and turgor, no rashes  Neurologic: oriented, normal, negative, normal mood  Extremities: normal strength, tone, and muscle mass, ROM of all joints is normal  HEENT PERRLA, extraocular movement intact and sclera clear, anicteric  Mouth/Teeth mucous membranes moist, pharynx normal without lesions and dental hygiene good  Neck supple and no masses  Cardiovascular: regular rate and rhythm  Respiratory:  no respiratory distress, normal breath sounds   Abdomen: soft, non-tender; bowel sounds normal; no masses,  no organomegaly    Assessment:    Pregnancy: G3P0010 Patient Active Problem List   Diagnosis Date Noted  . Supervision of high risk pregnancy, antepartum 11/16/2019  . Hypertension   . S/P pericardiocentesis 07/29/2019  . Headache 07/02/2018  . Anxiety 06/18/2017  . 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 SLE (systemic lupus erythematosus) (HCC) 10/30/2016  . Cognitive disorder   . Debility 10/12/2016  . Cerebral atrophy (HCC)   . MR (mental retardation)   . Drug-induced lupus erythematosus   . Tachycardia   . Hypokalemia   . Hyperthyroidism 10/03/2016  . Morbid obesity  with BMI of 40.0-44.9, adult (HCC) 10/03/2016  . 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 Cardiomegaly 10/03/2016  . Pleural effusion, left   . Graves disease 08/23/2016  . Low vitamin D level 08/23/2015  . History of chlamydia 02/25/2012  . Unspecified mood (affective) disorder (HCC) 07/09/2011     Plan:    1. Systemic lupus erythematosus, unspecified SLE type, unspecified organ involvement status (HCC) Will continue to follow up with her Rheumatologist. Continue Plaquenil.  - [place] MFM OB 11-14 WEEK ANATOMY; Future - AMB referral to maternal fetal medicine - [place] MFM OB DETAIL +14 WK; Future - Sjogrens syndrome-A extractable nuclear antibody - Sjogrens syndrome-B extractable nuclear antibody - Complement, total - [place] Fetal Echocardiography; Future  2. Morbid obesity with BMI of 40.0-44.9, adult (HCC) - [place] MFM OB 11-14 WEEK ANATOMY; Future - AMB referral to maternal fetal medicine - [place] MFM OB DETAIL +14 WK; Future - aspirin EC 81 MG tablet; Take 1 tablet (81 mg total) by mouth daily. Take after 12 weeks for prevention of preeclampsia later in pregnancy  Dispense: 300 tablet; Refill: 2 - Referral to Nutrition and Diabetes Services  3. Cerebellar degeneration (HCC) 4. Cerebral atrophy (HCC) Will await MFM recommendations - [place] MFM OB 11-14 WEEK ANATOMY;  Future - AMB referral to maternal fetal medicine - [place] MFM OB DETAIL +14 WK; Future  5. Graves disease - [place] MFM OB 11-14 WEEK ANATOMY; Future - AMB referral to maternal fetal medicine - [place] MFM OB DETAIL +14 WK; Future - TSH - T3, free - T4, free  6. Gastroesophageal reflux during pregnancy, antepartum Protonix prescribed - pantoprazole (PROTONIX) 20 MG tablet; Take 1 tablet (20 mg total) by mouth 2 (two) times daily.  Dispense: 60 tablet; Refill: 2  7. Trichomonal vaginitis during pregnancy in first trimester Test of cure done today. - Urine cytology ancillary only([facility]ig[facility])  8. [redacted] weeks gestation of pregnancy  9. Supervision of high risk pregnancy, antepartum - [place] MFM OB 11-14 WEEK ANATOMY; Future - AMB referral to maternal fetal medicine - [place] MFM OB DETAIL +14 WK; Future - pantoprazole (PROTONIX) 20 MG tablet; Take 1 tablet (20 mg total) by mouth 2 (two) times daily.  Dispense: 60 tablet; Refill: 2 - aspirin EC 81 MG tablet; Take 1 tablet (81 mg total) by mouth daily. Take after 12 weeks for prevention of preeclampsia later in pregnancy  Dispense: 300 tablet; Refill: 2 - Referral to Nutrition and Diabetes Services - CBC/D/Plt+RPR+Rh+ABO+Rub Ab... - Culture, OB Urine - Comprehensive metabolic panel - Hemoglobin A1c - Protein / creatinine ratio, urine - Genetic Screening Initial labs drawn. Continue prenatal vitamins. Problem list reviewed and updated. Genetic Screening discussed, First trimester screen and NIPS: ordered. Ultrasound discussed; fetal anatomic survey: ordered. Anticipatory guidance about prenatal visits given including labs, ultrasounds, and testing. Encouraged to complete MyChart Registration for her ability to review results, send requests, and have questions addressed.  The nature of [facility] with multiple MDs and Advanced Practice Providers was explained to patient; also emphasized that residents,  students are part of our team. Routine obstetric precautions reviewed. Encouraged to seek out care at office or emergency room [facility] preferred) for urgent and/or emergent concerns. Return in about 4 weeks (around 12/23/2019) for OFFICE [facility] Visit.     Jaynie Collins, MD, FACOG Obstetrician & Gynecologist, [facility] for [employer], [facility]

## 2019-11-25 NOTE — Patient Instructions (Signed)
First Trimester of Pregnancy The first trimester of pregnancy is from week 1 until the end of week 13 (months 1 through 3). A week after a sperm fertilizes an egg, the egg will implant on the wall of the uterus. This embryo will begin to develop into a baby. Genes from you and your partner will form the baby. The female genes will determine whether the baby will be a boy or a girl. At 6-8 weeks, the eyes and face will be formed, and the heartbeat can be seen on ultrasound. At the end of 12 weeks, all the baby's organs will be formed. Now that you are pregnant, you will want to do everything you can to have a healthy baby. Two of the most important things are to get good prenatal care and to follow your health care provider's instructions. Prenatal care is all the medical care you receive before the baby's birth. This care will help prevent, find, and treat any problems during the pregnancy and childbirth. Body changes during your first trimester Your body goes through many changes during pregnancy. The changes vary from woman to woman.  You may gain or lose a couple of pounds at first.  You may feel sick to your stomach (nauseous) and you may throw up (vomit). If the vomiting is uncontrollable, call your health care provider.  You may tire easily.  You may develop headaches that can be relieved by medicines. All medicines should be approved by your health care provider.  You may urinate more often. Painful urination may mean you have a bladder infection.  You may develop heartburn as a result of your pregnancy.  You may develop constipation because certain hormones are causing the muscles that push stool through your intestines to slow down.  You may develop hemorrhoids or swollen veins (varicose veins).  Your breasts may begin to grow larger and become tender. Your nipples may stick out more, and the tissue that surrounds them (areola) may become darker.  Your gums may bleed and may be  sensitive to brushing and flossing.  Dark spots or blotches (chloasma, mask of pregnancy) may develop on your face. This will likely fade after the baby is born.  Your menstrual periods will stop.  You may have a loss of appetite.  You may develop cravings for certain kinds of food.  You may have changes in your emotions from day to day, such as being excited to be pregnant or being concerned that something may go wrong with the pregnancy and baby.  You may have more vivid and strange dreams.  You may have changes in your hair. These can include thickening of your hair, rapid growth, and changes in texture. Some women also have hair loss during or after pregnancy, or hair that feels dry or thin. Your hair will most likely return to normal after your baby is born. What to expect at prenatal visits During a routine prenatal visit:  You will be weighed to make sure you and the baby are growing normally.  Your blood pressure will be taken.  Your abdomen will be measured to track your baby's growth.  The fetal heartbeat will be listened to between weeks 10 and 14 of your pregnancy.  Test results from any previous visits will be discussed. Your health care provider may ask you:  How you are feeling.  If you are feeling the baby move.  If you have had any abnormal symptoms, such as leaking fluid, bleeding, severe headaches, or abdominal   cramping.  If you are using any tobacco products, including cigarettes, chewing tobacco, and electronic cigarettes.  If you have any questions. Other tests that may be performed during your first trimester include:  Blood tests to find your blood type and to check for the presence of any previous infections. The tests will also be used to check for low iron levels (anemia) and protein on red blood cells (Rh antibodies). Depending on your risk factors, or if you previously had diabetes during pregnancy, you may have tests to check for high blood sugar  that affects pregnant women (gestational diabetes).  Urine tests to check for infections, diabetes, or protein in the urine.  An ultrasound to confirm the proper growth and development of the baby.  Fetal screens for spinal cord problems (spina bifida) and Down syndrome.  HIV (human immunodeficiency virus) testing. Routine prenatal testing includes screening for HIV, unless you choose not to have this test.  You may need other tests to make sure you and the baby are doing well. Follow these instructions at home: Medicines  Follow your health care provider's instructions regarding medicine use. Specific medicines may be either safe or unsafe to take during pregnancy.  Take a prenatal vitamin that contains at least 600 micrograms (mcg) of folic acid.  If you develop constipation, try taking a stool softener if your health care provider approves. Eating and drinking   Eat a balanced diet that includes fresh fruits and vegetables, whole grains, good sources of protein such as meat, eggs, or tofu, and low-fat dairy. Your health care provider will help you determine the amount of weight gain that is right for you.  Avoid raw meat and uncooked cheese. These carry germs that can cause birth defects in the baby.  Eating four or five small meals rather than three large meals a day may help relieve nausea and vomiting. If you start to feel nauseous, eating a few soda crackers can be helpful. Drinking liquids between meals, instead of during meals, also seems to help ease nausea and vomiting.  Limit foods that are high in fat and processed sugars, such as fried and sweet foods.  To prevent constipation: ? Eat foods that are high in fiber, such as fresh fruits and vegetables, whole grains, and beans. ? Drink enough fluid to keep your urine clear or pale yellow. Activity  Exercise only as directed by your health care provider. Most women can continue their usual exercise routine during  pregnancy. Try to exercise for 30 minutes at least 5 days a week. Exercising will help you: ? Control your weight. ? Stay in shape. ? Be prepared for labor and delivery.  Experiencing pain or cramping in the lower abdomen or lower back is a good sign that you should stop exercising. Check with your health care provider before continuing with normal exercises.  Try to avoid standing for long periods of time. Move your legs often if you must stand in one place for a long time.  Avoid heavy lifting.  Wear low-heeled shoes and practice good posture.  You may continue to have sex unless your health care provider tells you not to. Relieving pain and discomfort  Wear a good support bra to relieve breast tenderness.  Take warm sitz baths to soothe any pain or discomfort caused by hemorrhoids. Use hemorrhoid cream if your health care provider approves.  Rest with your legs elevated if you have leg cramps or low back pain.  If you develop varicose veins in   your legs, wear support hose. Elevate your feet for 15 minutes, 3-4 times a day. Limit salt in your diet. Prenatal care  Schedule your prenatal visits by the twelfth week of pregnancy. They are usually scheduled monthly at first, then more often in the last 2 months before delivery.  Write down your questions. Take them to your prenatal visits.  Keep all your prenatal visits as told by your health care provider. This is important. Safety  Wear your seat belt at all times when driving.  Make a list of emergency phone numbers, including numbers for family, friends, the hospital, and police and fire departments. General instructions  Ask your health care provider for a referral to a local prenatal education class. Begin classes no later than the beginning of month 6 of your pregnancy.  Ask for help if you have counseling or nutritional needs during pregnancy. Your health care provider can offer advice or refer you to specialists for help  with various needs.  Do not use hot tubs, steam rooms, or saunas.  Do not douche or use tampons or scented sanitary pads.  Do not cross your legs for long periods of time.  Avoid cat litter boxes and soil used by cats. These carry germs that can cause birth defects in the baby and possibly loss of the fetus by miscarriage or stillbirth.  Avoid all smoking, herbs, alcohol, and medicines not prescribed by your health care provider. Chemicals in these products affect the formation and growth of the baby.  Do not use any products that contain nicotine or tobacco, such as cigarettes and e-cigarettes. If you need help quitting, ask your health care provider. You may receive counseling support and other resources to help you quit.  Schedule a dentist appointment. At home, brush your teeth with a soft toothbrush and be gentle when you floss. Contact a health care provider if:  You have dizziness.  You have mild pelvic cramps, pelvic pressure, or nagging pain in the abdominal area.  You have persistent nausea, vomiting, or diarrhea.  You have a bad smelling vaginal discharge.  You have pain when you urinate.  You notice increased swelling in your face, hands, legs, or ankles.  You are exposed to fifth disease or chickenpox.  You are exposed to German measles (rubella) and have never had it. Get help right away if:  You have a fever.  You are leaking fluid from your vagina.  You have spotting or bleeding from your vagina.  You have severe abdominal cramping or pain.  You have rapid weight gain or loss.  You vomit blood or material that looks like coffee grounds.  You develop a severe headache.  You have shortness of breath.  You have any kind of trauma, such as from a fall or a car accident. Summary  The first trimester of pregnancy is from week 1 until the end of week 13 (months 1 through 3).  Your body goes through many changes during pregnancy. The changes vary from  woman to woman.  You will have routine prenatal visits. During those visits, your health care provider will examine you, discuss any test results you may have, and talk with you about how you are feeling. This information is not intended to replace advice given to you by your health care provider. Make sure you discuss any questions you have with your health care provider. Document Revised: 04/19/2017 Document Reviewed: 04/18/2016 Elsevier Patient Education  2020 [employer].  

## 2019-11-25 NOTE — Progress Notes (Signed)
GAD-7 positive today. [facility] services offered and pt declined.   Fleet Contras RN 11/25/19

## 2019-11-26 ENCOUNTER — Encounter: Payer: Medicare Other | Admitting: Dietician

## 2019-11-26 LAB — PROTEIN / CREATININE RATIO, URINE
Creatinine, Urine: 290.1 mg/dL
Protein, Ur: 23.6 mg/dL
Protein/Creat Ratio: 81 mg/g creat (ref 0–200)

## 2019-11-27 LAB — CBC/D/PLT+RPR+RH+ABO+RUB AB...
Antibody Screen: NEGATIVE
Basophils Absolute: 0 10*3/uL (ref 0.0–0.2)
Basos: 1 %
EOS (ABSOLUTE): 0 10*3/uL (ref 0.0–0.4)
Eos: 1 %
HCV Ab: <0.1 s/co ratio (ref 0.0–0.9)
HIV Screen 4th Generation wRfx: NONREACTIVE
Hematocrit: 35.2 % (ref 34.0–46.6)
Hemoglobin: 10.9 g/dL — ABNORMAL LOW (ref 11.1–15.9)
Hepatitis B Surface Ag: NEGATIVE
Immature Grans (Abs): 0 10*3/uL (ref 0.0–0.1)
Immature Granulocytes: 0 %
Lymphocytes Absolute: 0.9 10*3/uL (ref 0.7–3.1)
Lymphs: 21 %
MCH: 25.7 pg — ABNORMAL LOW (ref 26.6–33.0)
MCHC: 31 g/dL — ABNORMAL LOW (ref 31.5–35.7)
MCV: 83 fL (ref 79–97)
Monocytes Absolute: 0.4 10*3/uL (ref 0.1–0.9)
Monocytes: 10 %
Neutrophils Absolute: 2.7 10*3/uL (ref 1.4–7.0)
Neutrophils: 67 %
Platelets: 248 10*3/uL (ref 150–450)
RBC: 4.24 x10E6/uL (ref 3.77–5.28)
RDW: 13.6 % (ref 11.7–15.4)
RPR Ser Ql: NONREACTIVE
Rh Factor: POSITIVE
Rubella Antibodies, IGG: 19.6 index (ref >0.99)
WBC: 4.1 10*3/uL (ref 3.4–10.8)

## 2019-11-27 LAB — COMPREHENSIVE METABOLIC PANEL
ALT: 13 IU/L (ref 0–32)
AST: 15 IU/L (ref 0–40)
Albumin/Globulin Ratio: 0.9 — ABNORMAL LOW (ref 1.2–2.2)
Albumin: 3.4 g/dL — ABNORMAL LOW (ref 3.9–5.0)
Alkaline Phosphatase: 52 IU/L (ref 48–121)
BUN/Creatinine Ratio: 10 (ref 9–23)
BUN: 7 mg/dL (ref 6–20)
Bilirubin Total: <0.2 mg/dL (ref 0.0–1.2)
CO2: 21 mmol/L (ref 20–29)
Calcium: 9.2 mg/dL (ref 8.7–10.2)
Chloride: 98 mmol/L (ref 96–106)
Creatinine, Ser: 0.67 mg/dL (ref 0.57–1.00)
GFR calc Af Amer: 143 mL/min/{1.73_m2} (ref >59)
GFR calc non Af Amer: 124 mL/min/{1.73_m2} (ref >59)
Globulin, Total: 3.6 g/dL (ref 1.5–4.5)
Glucose: 71 mg/dL (ref 65–99)
Potassium: 4 mmol/L (ref 3.5–5.2)
Sodium: 131 mmol/L — ABNORMAL LOW (ref 134–144)
Total Protein: 7 g/dL (ref 6.0–8.5)

## 2019-11-27 LAB — SJOGRENS SYNDROME-A EXTRACTABLE NUCLEAR ANTIBODY: ENA SSA (RO) Ab: <0.2 AI (ref 0.0–0.9)

## 2019-11-27 LAB — SJOGRENS SYNDROME-B EXTRACTABLE NUCLEAR ANTIBODY: ENA SSB (LA) Ab: 0.8 AI (ref 0.0–0.9)

## 2019-11-27 LAB — T3, FREE: T3, Free: 3.4 pg/mL (ref 2.0–4.4)

## 2019-11-27 LAB — COMPLEMENT, TOTAL: Compl, Total (CH50): >60 U/mL (ref >41)

## 2019-11-27 LAB — T4, FREE: Free T4: 1.13 ng/dL (ref 0.82–1.77)

## 2019-11-27 LAB — HCV INTERPRETATION

## 2019-11-27 LAB — HEMOGLOBIN A1C
Est. average glucose Bld gHb Est-mCnc: 100 mg/dL
Hgb A1c MFr Bld: 5.1 % (ref 4.8–5.6)

## 2019-11-27 LAB — TSH: TSH: 1.65 u[IU]/mL (ref 0.450–4.500)

## 2019-11-29 LAB — URINE CULTURE, OB REFLEX

## 2019-11-29 LAB — CULTURE, OB URINE

## 2019-11-30 MED ORDER — CEFADROXIL 500 MG PO CAPS: 500.0000 mg | ORAL_CAPSULE | Freq: Two times a day (BID) | ORAL | 0 refills | Status: DC

## 2019-11-30 NOTE — Addendum Note (Signed)
Addended by: Jaynie Collins A on: 11/30/2019 09:09 AM   Modules accepted: Orders

## 2019-12-01 ENCOUNTER — Other Ambulatory Visit: Payer: Medicare Other

## 2019-12-01 ENCOUNTER — Telehealth: Payer: Self-pay | Admitting: Registered"

## 2019-12-01 ENCOUNTER — Telehealth: Payer: Self-pay | Admitting: Family Medicine

## 2019-12-01 NOTE — Telephone Encounter (Signed)
RD attempted to reach patient to ask her to stop at [facility] to pick up a glucose meter so I could teach her how to use it. Patient's voicemail was not set up. RD called alternate contact number and her mother answered the call. Patient's mother stated that she was bringing her daughter to her appointment today and RD asked if she could stop at [facility] to pick up a meter so I could teach the patient how to use it.   Patient's mother picked up meter, but nutrition appointment was cancelled so RD was not able to meet with patient today. [facility] will see if her nutrition appointment can be rescheduled.  Meter givenLorenda Peck Guide Me Lot #[id number] Exp: 02/09/2021

## 2019-12-01 NOTE — Telephone Encounter (Signed)
Patient mom called to cancel her Diabetic appointment due to she is not emotionally not feeling well, mom said she will call back to get her rescheduled.

## 2019-12-02 ENCOUNTER — Encounter (HOSPITAL_COMMUNITY): Payer: Self-pay | Admitting: Obstetrics and Gynecology

## 2019-12-02 ENCOUNTER — Inpatient Hospital Stay (HOSPITAL_COMMUNITY): Payer: Medicare Other

## 2019-12-02 ENCOUNTER — Inpatient Hospital Stay (HOSPITAL_COMMUNITY)
Admission: AD | Admit: 2019-12-02 | Discharge: 2019-12-02 | Disposition: A | Discharge status: Home / Self Care | Payer: Medicare Other | Attending: Obstetrics and Gynecology | Admitting: Obstetrics and Gynecology

## 2019-12-02 ENCOUNTER — Other Ambulatory Visit: Payer: Self-pay

## 2019-12-02 DIAGNOSIS — O99341 Other mental disorders complicating pregnancy, first trimester: Secondary | ICD-10-CM | POA: Diagnosis not present

## 2019-12-02 DIAGNOSIS — O3680X0 Pregnancy with inconclusive fetal viability, not applicable or unspecified: Secondary | ICD-10-CM

## 2019-12-02 DIAGNOSIS — W010XXA Fall on same level from slipping, tripping and stumbling without subsequent striking against object, initial encounter: Secondary | ICD-10-CM | POA: Insufficient documentation

## 2019-12-02 DIAGNOSIS — M329 Systemic lupus erythematosus, unspecified: Secondary | ICD-10-CM | POA: Diagnosis not present

## 2019-12-02 DIAGNOSIS — S0512XA Contusion of eyeball and orbital tissues, left eye, initial encounter: Secondary | ICD-10-CM

## 2019-12-02 DIAGNOSIS — Z3A11 11 weeks gestation of pregnancy: Secondary | ICD-10-CM | POA: Insufficient documentation

## 2019-12-02 DIAGNOSIS — Z7982 Long term (current) use of aspirin: Secondary | ICD-10-CM | POA: Diagnosis not present

## 2019-12-02 DIAGNOSIS — O99011 Anemia complicating pregnancy, first trimester: Secondary | ICD-10-CM | POA: Insufficient documentation

## 2019-12-02 DIAGNOSIS — W19XXXA Unspecified fall, initial encounter: Secondary | ICD-10-CM

## 2019-12-02 DIAGNOSIS — Y92009 Unspecified place in unspecified non-institutional (private) residence as the place of occurrence of the external cause: Secondary | ICD-10-CM | POA: Diagnosis not present

## 2019-12-02 DIAGNOSIS — Z79899 Other long term (current) drug therapy: Secondary | ICD-10-CM | POA: Diagnosis not present

## 2019-12-02 DIAGNOSIS — Z7952 Long term (current) use of systemic steroids: Secondary | ICD-10-CM | POA: Insufficient documentation

## 2019-12-02 DIAGNOSIS — O99891 Other specified diseases and conditions complicating pregnancy: Secondary | ICD-10-CM

## 2019-12-02 DIAGNOSIS — F418 Other specified anxiety disorders: Secondary | ICD-10-CM | POA: Diagnosis not present

## 2019-12-02 DIAGNOSIS — D649 Anemia, unspecified: Secondary | ICD-10-CM | POA: Diagnosis not present

## 2019-12-02 DIAGNOSIS — O9A211 Injury, poisoning and certain other consequences of external causes complicating pregnancy, first trimester: Secondary | ICD-10-CM | POA: Insufficient documentation

## 2019-12-02 DIAGNOSIS — O10911 Unspecified pre-existing hypertension complicating pregnancy, first trimester: Secondary | ICD-10-CM | POA: Insufficient documentation

## 2019-12-02 DIAGNOSIS — Z3A12 12 weeks gestation of pregnancy: Secondary | ICD-10-CM | POA: Diagnosis not present

## 2019-12-02 DIAGNOSIS — Z9989 Dependence on other enabling machines and devices: Secondary | ICD-10-CM | POA: Diagnosis not present

## 2019-12-02 NOTE — MAU Provider Note (Signed)
History     CSN: [id number]  Arrival date and time: 12/02/19 1252   First Provider Initiated Contact with Patient 12/02/19 1348      Chief Complaint  Patient presents with  . Fall   HPI  Ms.Erika Boyer is a 24 y.o. female G2P0010 @ [redacted]w[redacted]d here in MAU after a fall that occurred last night at home around 10 pm. She did not land on her abdomen. She landed on her left side and right knee. She does attests to hitting the side of her head on the floor. She woke up with a swollen left eye this morning. She has no HA, no scotoma, no N/V. She has slippery floors in her home and slipped and fell while walking. She has SLE and uses a walker some of the time. She did not lose consciousness. She recalls everything that happened. She is able to move all extremities without pain.   OB History    Gravida  2   Para  0   Term  0   Preterm  0   AB  1   Living  0     SAB  0   TAB  1   Ectopic  0   Multiple  0   Live Births  0           Past Medical History:  Diagnosis Date  . Anemia   . Anxiety   . Arthritis    "right knee" (10/11/2016)  . 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 Cervical high risk HPV (human papillomavirus) test positive 03/20/2017   Pap q year  . Chronic bronchitis (HCC)   . Daily headache   . Depression   . Graves disease 2012   hx; "was on RX; it disappeared" (10/11/2016)  . History of blood transfusion    "related to the anemia" (10/11/2016)  . Hypertension   . Lupus (HCC)    "not sure what kind" (10/11/2016)  . Microcytic anemia 10/03/2016  . Pneumonia 10/03/2016    Past Surgical History:  Procedure Laterality Date  . PERICARDIOCENTESIS N/A 10/04/2016   Procedure: Pericardiocentesis;  Surgeon: Yates Decamp, MD;  Location: [facility] INVASIVE CV LAB;  Service: Cardiovascular;  Laterality: N/A;  . REDUCTION MAMMAPLASTY Bilateral 2012  . TONSILLECTOMY      Family History  Problem Relation Age of Onset  . Thalassemia Mother   . Arthritis Mother   . Breast  cancer Mother        Mastectomy in early 25's.    . High blood pressure Father   . Mental illness Father        question bipolar disorder per wife  . Sickle cell trait Maternal Aunt   . Breast cancer Maternal Aunt        4 maternal aunts w/breast cancer  . Breast cancer Maternal Grandmother        Bone  . Multiple sclerosis Cousin   . Diabetes Paternal Grandfather     Social History   Tobacco Use  . Smoking status: Never Smoker  . Smokeless tobacco: Never Used  Vaping Use  . Vaping Use: Never used  Substance Use Topics  . Alcohol use: No  . Drug use: No    Allergies:  Allergies  Allergen Reactions  . Shellfish Allergy Anaphylaxis  . Morphine And Related Itching  . Mushroom Extract Complex Other (See Comments)    Mother is allergic, so patient avoids these  . Tapazole [Methimazole] Swelling and Rash    WELTS (also)  Mother suspects this MAY be an allergy, as the onset of symptoms coincided with this being started    Medications Prior to Admission  Medication Sig Dispense Refill Last Dose  . aspirin EC 81 MG tablet Take 1 tablet (81 mg total) by mouth daily. Take after 12 weeks for prevention of preeclampsia later in pregnancy 300 tablet 2 12/02/2019 at Unknown time  . hydroxychloroquine (PLAQUENIL) 200 MG tablet TK 2 TS PO QD WF OR MILK  2 12/02/2019 at Unknown time  . predniSONE (DELTASONE) 1 MG tablet Take 1 tablet (1 mg total) by mouth daily with breakfast. 90 tablet 0 12/02/2019 at Unknown time  . prenatal vitamin w/FE, FA (PRENATAL 1 + 1) 27-1 MG TABS tablet Take 1 tablet by mouth daily at 12 noon. 30 tablet 12 12/02/2019 at Unknown time  . acetaminophen (TYLENOL) 500 MG tablet Take 1 tablet (500 mg total) by mouth every 6 (six) hours as needed. 30 tablet 0 More than a month at Unknown time  . albuterol (PROAIR HFA) 108 (90 Base) MCG/ACT inhaler Inhale 2 puffs into the lungs every 4 (four) hours as needed for wheezing or shortness of breath.   More than a month at Unknown  time  . cefadroxil (DURICEF) 500 MG capsule Take 1 capsule (500 mg total) by mouth 2 (two) times daily. 14 capsule 0   . EPINEPHrine 0.3 mg/0.3 mL IJ SOAJ injection USE AS DIRECTED AND THEN CALL 911 (Patient not taking: Reported on 11/25/2019)     . pantoprazole (PROTONIX) 20 MG tablet Take 1 tablet (20 mg total) by mouth 2 (two) times daily. 60 tablet 2    No results found for this or any previous visit (from the past 48 hour(s)).  US OB Comp Less 14 Wks  Result Date: 12/02/2019 CLINICAL DATA:  Pregnant, fell, fetal heart tones not detected EXAM: OBSTETRIC <14 WK ULTRASOUND TECHNIQUE: Transabdominal ultrasound was performed for evaluation of the gestation as well as the maternal uterus and adnexal regions. COMPARISON:  None. FINDINGS: Intrauterine gestational sac: Single Yolk sac:  Not Visualized. Embryo:  Visualized. Cardiac Activity: Visualized. Heart Rate: 166 bpm CRL: 45.4 mm   11 w 2 d                  [place] EDC: 06/20/2020 Subchorionic hemorrhage:  None visualized. Maternal uterus/adnexae: Evaluation of the adnexa is limited due to patient body habitus. The ovaries are not well visualized. No free fluid. IMPRESSION: 1. Single live intrauterine pregnancy as above, estimated age 66 weeks and 2 days. Electronically Signed   By: Sharlet Salina M.D.   On: 12/02/2019 15:00   Review of Systems  HENT: Positive for facial swelling.   Genitourinary: Negative for vaginal bleeding.  Neurological: Negative for dizziness.   Physical Exam   Blood pressure 125/83, pulse 98, temperature 98.8 F (37.1 C), temperature source Oral, resp. rate 20, height 5\' 5"  (1.651 m), weight (!) 148.4 kg, last menstrual period 09/03/2019, SpO2 100 %.  Physical Exam Constitutional:      General: She is not in acute distress.    Appearance: Normal appearance. She is obese. She is not ill-appearing or toxic-appearing.  HENT:     Head: Normocephalic. Contusion (Left eye abrasion) present. No raccoon eyes, right periorbital  erythema, left periorbital erythema or laceration.  Eyes:     General: Lids are normal. No visual field deficit.    Extraocular Movements:     Right eye: Normal extraocular motion and no nystagmus.  Left eye: Normal extraocular motion and no nystagmus.     Conjunctiva/sclera: Conjunctivae normal.     Left eye: No hemorrhage. Skin:    General: Skin is warm.  Neurological:     General: No focal deficit present.     Mental Status: She is alert.  Psychiatric:        Mood and Affect: Mood normal.     MAU Course  Procedures   Pt informed that the ultrasound is considered a limited OB ultrasound and is not intended to be a complete ultrasound exam.  Patient also informed that the ultrasound is not being completed with the intent of assessing for fetal or placental anomalies or any pelvic abnormalities.  Explained that the purpose of today's ultrasound is to assess for  viability.  Patient acknowledges the purpose of the exam and the limitations of the study.     MDM  Unable to get a reassuring visualization of fetal heart rate or movement.  Official US ordered for viability.    Assessment and Plan   A:  1. Fall, initial encounter   2. Encounter to determine fetal viability of pregnancy   3. Contusion of left eye, initial encounter     P:  Discharge home in stable condition Message sent to the [facility] to refer patient for Pt/OT. She may need a home assessment done.  Encouraged walker use regularly No rugs in the home Continue to ice your left eye  Return to MAU if symptoms worsen   Nayara Taplin, Harolyn Rutherford, NP 12/02/2019 4:30 PM

## 2019-12-02 NOTE — MAU Note (Signed)
Last night fell really hard, landed on her left side.  Is worried about the baby.  (pt has a neurological condition, is shaky, uses a walker- but reports "not all the time").  Denies any pain or bleeding.

## 2019-12-02 NOTE — Discharge Instructions (Signed)

## 2019-12-04 ENCOUNTER — Encounter: Payer: Self-pay | Admitting: General Practice

## 2019-12-04 ENCOUNTER — Telehealth (INDEPENDENT_AMBULATORY_CARE_PROVIDER_SITE_OTHER): Payer: Medicare Other | Admitting: General Practice

## 2019-12-04 ENCOUNTER — Telehealth: Payer: Self-pay

## 2019-12-04 DIAGNOSIS — O285 Abnormal chromosomal and genetic finding on antenatal screening of mother: Secondary | ICD-10-CM

## 2019-12-04 DIAGNOSIS — M329 Systemic lupus erythematosus, unspecified: Secondary | ICD-10-CM

## 2019-12-04 DIAGNOSIS — O288 Other abnormal findings on antenatal screening of mother: Secondary | ICD-10-CM

## 2019-12-04 NOTE — Telephone Encounter (Addendum)
-----   Message from Duane Lope, NP sent at 12/02/2019  4:32 PM EDT ----- Regarding: PT/OT referral Can we referral her for PT/OT d/t SLE and recent fall at home. She uses a walker.  Thank you,  Victorino Dike, NP   Amb ref to rehab to PT at [facility] sent via MyChart.

## 2019-12-04 NOTE — Telephone Encounter (Signed)
Patient's Panorama results came back high risk for T21- Dr Macon Large informed. Scheduled appt with MFM for nuchal translucency and genetic counseling for 7/20  @ 330. Called and informed patient of results & appt on Tuesday. Patient verbalized understanding.

## 2019-12-08 ENCOUNTER — Ambulatory Visit: Payer: Self-pay | Admitting: Genetic Counselor

## 2019-12-08 ENCOUNTER — Ambulatory Visit: Payer: Medicare Other | Attending: Obstetrics and Gynecology

## 2019-12-08 ENCOUNTER — Ambulatory Visit (HOSPITAL_BASED_OUTPATIENT_CLINIC_OR_DEPARTMENT_OTHER): Payer: Medicare Other | Admitting: Genetic Counselor

## 2019-12-08 ENCOUNTER — Ambulatory Visit: Payer: Medicare Other | Admitting: *Deleted

## 2019-12-08 ENCOUNTER — Other Ambulatory Visit: Payer: Self-pay

## 2019-12-08 DIAGNOSIS — O285 Abnormal chromosomal and genetic finding on antenatal screening of mother: Secondary | ICD-10-CM

## 2019-12-08 DIAGNOSIS — O289 Unspecified abnormal findings on antenatal screening of mother: Secondary | ICD-10-CM

## 2019-12-08 DIAGNOSIS — O10011 Pre-existing essential hypertension complicating pregnancy, first trimester: Secondary | ICD-10-CM

## 2019-12-08 DIAGNOSIS — O26891 Other specified pregnancy related conditions, first trimester: Secondary | ICD-10-CM

## 2019-12-08 DIAGNOSIS — M329 Systemic lupus erythematosus, unspecified: Secondary | ICD-10-CM

## 2019-12-08 DIAGNOSIS — Z3A12 12 weeks gestation of pregnancy: Secondary | ICD-10-CM

## 2019-12-08 DIAGNOSIS — Z315 Encounter for genetic counseling: Secondary | ICD-10-CM | POA: Diagnosis not present

## 2019-12-08 DIAGNOSIS — E669 Obesity, unspecified: Secondary | ICD-10-CM | POA: Diagnosis not present

## 2019-12-08 DIAGNOSIS — E0590 Thyrotoxicosis, unspecified without thyrotoxic crisis or storm: Secondary | ICD-10-CM | POA: Diagnosis not present

## 2019-12-08 DIAGNOSIS — O99281 Endocrine, nutritional and metabolic diseases complicating pregnancy, first trimester: Secondary | ICD-10-CM

## 2019-12-08 DIAGNOSIS — O288 Other abnormal findings on antenatal screening of mother: Secondary | ICD-10-CM | POA: Insufficient documentation

## 2019-12-08 DIAGNOSIS — O283 Abnormal ultrasonic finding on antenatal screening of mother: Secondary | ICD-10-CM

## 2019-12-08 DIAGNOSIS — Z363 Encounter for antenatal screening for malformations: Secondary | ICD-10-CM

## 2019-12-08 DIAGNOSIS — O0990 Supervision of high risk pregnancy, unspecified, unspecified trimester: Secondary | ICD-10-CM | POA: Diagnosis not present

## 2019-12-08 DIAGNOSIS — O99211 Obesity complicating pregnancy, first trimester: Secondary | ICD-10-CM

## 2019-12-08 DIAGNOSIS — Z3A13 13 weeks gestation of pregnancy: Secondary | ICD-10-CM

## 2019-12-08 NOTE — Progress Notes (Signed)
12/08/2019  Baxter FlatteryOmaure Radin [birth year] MRN: [id number] DOV: 12/08/2019  Ms. Boylan presented to the [facility] for a genetics consultation regarding noninvasive prenatal screening (NIPS) that was high risk for trisomy 6021. Ms. Harrold Donathziokhai presented to her appointment alone.   Indication for genetic counseling - NIPS high risk for trisomy 7121  Prenatal history  Ms. Harrold Donathziokhai is a 102P0010, 24 y.o. female. Her current pregnancy has completed 7051w5d (Estimated Date of Delivery: 06/10/19). Ms. Harrold Donathziokhai had one pregnancy with a prior partner that she terminated for medication-related problems. The current pregnancy is the first for this couple.  Ms. Harrold Donathziokhai denied exposure to environmental toxins or chemical agents. She denied the use of alcohol, tobacco or street drugs. She reported taking hydroxychloroquine, prednisone, iron, and prenatal vitamins. She denied significant fevers and bleeding during the course of her pregnancy. She reported having a head cold in the first trimester.   Family History  A three generation pedigree was drafted and reviewed. The family history is remarkable for the following:  - Ms. Harrold Donathziokhai has a complicated medical history that includes cerebral atrophy and cerebellar degeneration diagnosed at age 24 following a viral infection. 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 Ms. Harrold Donathziokhai also has a history of lupus, Graves disease, cardiomegaly, and anemia. She had a pericardiocentesis in 2018 for pericardial effusion. While there are notes in her record that indicate that Ms. Ault underwent genetic testing in [place] in the past, it is not clear if this actually happened. Upon asking her if she has had genetic testing, Ms. Axon informed me that she was unsure.   - Ms. Harrold Donathziokhai had a brother that died at two months of age. She did not have any more information about this sibling's cause of  death; thus, risk assessment was limited.  - Several individuals in Ms. Wardrop's family have a history of learning disabilities, including Ms. Meditz herself, her father, three paternal half brothers, a maternal half brother, and a maternal aunt. Ms. Harrold Donathziokhai also reported that her partner, Laureen OchsDeAngelo Stimpson, and his son from a prior relationship have learning disabilities. We discussed that many times, learning difficulties are multifactorial in nature, occurring due to a combination of genetic and environmental factors that are difficult to identify. Learning disabilities can appear to run in families. Given the extensive family history of learning disabilities, there is an increased chance that the couples's children could also have a learning disability. Ms. Harrold Donathziokhai understands that she should make the pediatrician aware of any concerns she has about her children's development.  - Several individuals in Ms. Salonga's family have a history of mental illness, including her mother, a maternal aunt, her father, two paternal uncles, a paternal aunt, and her paternal half brother's daughter. We discussed that many forms of mental illness are multifactorial in nature, occurring due to a combination of genetic, lifestyle, and environmental factors. Mental illness can appear to run in families; however, due to the multifactorial nature of mental illnesses, the recurrence risks are not well-established.  - Ms. Saini's paternal half brother has a daughter whose son was born with a "hole in his heart". The exact etiology of this congenital heart defect (CHD) is unknown. There are multifactorial causes for isolated CHDs, including environmental and genetic factors. Given that this is a distant relative, the chance that Ms. Harrold Donathziokhai could have a child with a CHD is likely not greatly elevated over the 0.5-1% general population risk.   -  Ms. Tinner paternal half brother has a daughter who has had eight  miscarriages. There are many potential causes of recurrent miscarriages, including anatomic, immunologic, endocrine, and genetic factors. However, a cause is only identified in 50% of individuals who experience recurrent miscarriages. Approximately 2-5% of couples who experience multiple miscarriages carry a balanced translocation that can become unbalanced and potentially lethal in their offspring. In addition to chromosomal abnormalities, certain single gene, X-linked, and polygenic/multifactorial disorders are associated with recurrent miscarriage. Given that the etiology of this relative's recurrent miscarriages is unknown, risk assessment was limited.  The remaining family histories were reviewed and found to be noncontributory for birth defects, intellectual disability, recurrent pregnancy loss, and known genetic conditions. Ms. Virella had limited information about parts of her partner's family history; thus, risk assessment was limited.  The patient's ethnicity is [place], [place], and Native [place] The father of the pregnancy's ethnicity is [place] [address] and Native American ([place]). Ashkenazi Jewish ancestry and consanguinity were denied. Pedigree will be scanned under Media.  Discussion  NIPS result:  Ms. Wyke referred for genetic counseling as results from noninvasive prenatal screening (NIPS)came back positive for an increased risk oftrisomy 21, akaDownsyndrome,in the current pregnancy.Down syndrome is one of the most common extra chromosome conditions, as approximately 1 in 800 babies are born with this condition.There are different types of Down syndrome, with each type determined by the arrangement of the chromosome 21 pair. We reviewed that approximately 95% of cases are caused by an entire extra copy of chromosome 21 (trisomy 21), and 2-4% of cases are due to a chromosomal rearrangement (translocation) involving chromosome  21. We reviewed that Down syndrome most commonly occurs by chance due to an error in chromosomal division during the formation of egg and sperm cells in a process called nondisjunction.  We reviewed that Down syndrome is characterized by adistinctivefacial appearance, mild to moderate intellectual disability, and an increased chance for a heart defect. Approximately half of babies with Down syndrome are born with a heart defect that may require surgery after birth.While many children with Down syndrome look similar to each other, each child with Down syndrome is unique and will have many more features in common with his or her own familymembers. We discussed that there aremanymore featuresthat can beassociated with Down syndrome; however, it is not possible toaccurately predict all features that would be present in an individual with Down syndrome prenatally. Additionally, there is a high degree of variability seen among children who have this condition, meaning that every child with Down syndrome will not be affected in exactly the same way, and some children will have more or less features than others.Itis not possible to predict what strengths and weaknesses a child with Down syndrome will have, just like it is not possible to predict this for any child.   Ms. Samad also counseled that there is an increased fetal loss rate associated with Down syndrome. Of affectedliveborn infants, 90% survive the first 10 years oflife. Withthe advances in medical technology, early intervention, and supportive therapies, many individuals with Down syndrome are able to live with an increasing degree of independence. Today, many adults with Down syndrome care for themselves, have jobs, and often live in group homes or apartments where assistance is available if needed.  We reviewed that NIPS analyzes cell free DNA originating from the placenta that is found in the maternal blood circulation during  pregnancy. This test can provide information regarding the presence or absence of  extra fetal DNA for chromosomes 13, 18 and 21as well as the sex chromosomes. The reported detection rate is greater than 99% for trisomy 21.However, it cannot be considered diagnostic. Positive predictive value (PPV) is the probability that a pregnancy with a positive test result is truly affected. The PPV reported bythe NIPS laboratory for trisomy 21 in the current pregnancywas estimated to be90%. The PPV calculator offered by the [employer] and the [employer] estimatedPPV for trisomy 21in the current pregnancytobe 51%. Thus, there is a50-90% chance that this could be a true positive result.  Ms. Manville counseled that there are several possible explanations for her high risk NIPS result. Firstly, the fetus could truly be affected byDownsyndrome or could be mosaic for Down syndrome. Secondly, the placenta could have Down syndromewhile the fetus could be unaffected. This is a phenomenon known as confined placental mosaicism (CPM).Lastly, this could be a false positive result.  Ultrasound:  A nuchal translucency ultrasound was performed today prior to our visit. The ultrasound report will be sent under separate cover. An increased nuchal translucency of 3 mm and an absent nasal bone were noted today. Ms. Krenzer was counseled that these findings can be associated with Down syndrome as well as other chromosomal abnormalities. This increases the chances of the current fetus having Down syndrome.   Diagnostic testing:  Chorionic villus sampling ([facility]) was not possible today as our doctor that performs that procedure is out of the office for the week. Ms. Fredericks was counseled regarding diagnostic testing via amniocentesis beginning at 1 weeks' gestation. We discussed the technical aspects of the procedure and quoted up to a 1 in 500 (0.2%) risk for spontaneous  pregnancy loss or other adverse pregnancy outcomes as a result of amniocentesis. Cultured cells from an amniocentesis sample allow for the visualization of a fetal karyotype, which can detect >99% of chromosomal aberrations. Chromosomal microarray can also be performed to identify smaller deletions or duplications of fetal chromosomal material. Ms. Wittke was informed that amniocentesis is the only way to determine definitively whether or not the baby has Down syndrome prenatally. Ms. Leclere indicated that she may consider undergoing amniocentesis.  Plan:  Ms. Mooers was tearful during today's session. She disclosed that she is scared of losing her baby. I validated Ms. Rabe's concerns and acknowledged that she had been given a lot of information today. Given that Ms. Burtt was feeling overwhelmed, she decided to wait until her next ultrasound to make a decision regarding diagnostic testing. She informed me that even if a diagnosis of Down syndrome is confirmed on diagnostic testing, it would not change how she would manage the pregnancy; she plans to continue no matter the outcome. We discussed supportive people in Ms. Bucaro's life who can help her make the decision regarding diagnostic testing. She also said that these support resources in her life (specifically her mother, friend, and sister) would help her raise a baby with special needs. Ample support resources will be critical for Ms. Corliss given that she has complex care needs herself.  I offered to provide Ms. Brazie resources on trisomy 21 and amniocentesis for her review at home, which she declined today. If she decides that she does not want an amniocentesis at her next appointment, I will provide her with resources that discuss trisomy 21 in more detail at that time. I will plan on checking up on her routinely at her ultrasound visits to answer any questions she or her support people may  have.  Finally, Horizon-27 carrier  screening was pending at the time of Ms. Pies's visit. I will follow up on any findings that may be identified during her next ultrasound.  I counseled Ms. Calico regarding the above risks and available options. Second year [employer] genetic counseling student Norlene Duel participated in portions of today's session under my supervision. The approximate face-to-face time with the genetic counselor was 45 minutes.  In summary:  Reviewed NIPS result   NIPS high risk for trisomy 79  Reviewed results of ultrasound  Increased nuchal translucency & absent nasal bone identified  Increases chances that fetus is affected by trisomy 26  Offered additional testing and screening  Considering amniocentesis after 16 weeks. Will make decision by next ultrasound  Carrier screening still pending  Reviewed family history concerns   Gershon Crane, MS, Aeronautical engineer

## 2019-12-15 LAB — URINE CYTOLOGY ANCILLARY ONLY
Bacterial Vaginitis-Urine: NEGATIVE
Candida Urine: NEGATIVE
Chlamydia: NEGATIVE
Comment: NEGATIVE
Comment: NEGATIVE
Comment: NORMAL
Neisseria Gonorrhea: NEGATIVE
Trichomonas: NEGATIVE

## 2019-12-15 NOTE — Telephone Encounter (Signed)
Called and spoke with Morrie Sheldon at [facility] [place] and was informed that they are going to call the patient today to schedule an appt.    Addison Naegeli, RN

## 2019-12-17 ENCOUNTER — Telehealth: Payer: Self-pay | Admitting: *Deleted

## 2019-12-17 ENCOUNTER — Ambulatory Visit: Payer: Medicare Other | Attending: Obstetrics and Gynecology | Admitting: Physical Therapy

## 2019-12-17 NOTE — Telephone Encounter (Signed)
Received VM message from [facility] [place]. She stated that a nurse had called on Tuesday 7/27 to schedule urgent appt for pt. The appt was scheduled for today and she wanted [place] to know that pt did not show for the appt. They have cancelled all additional appts which had been made and wanted to let us know in case we wanted to reach out to the pt.

## 2019-12-23 ENCOUNTER — Encounter: Payer: Self-pay | Admitting: Family Medicine

## 2019-12-23 ENCOUNTER — Ambulatory Visit (INDEPENDENT_AMBULATORY_CARE_PROVIDER_SITE_OTHER): Payer: Medicare Other | Admitting: Obstetrics & Gynecology

## 2019-12-23 ENCOUNTER — Other Ambulatory Visit: Payer: Self-pay

## 2019-12-23 VITALS — BP 110/62 | HR 105 | Wt 329.8 lb

## 2019-12-23 DIAGNOSIS — I159 Secondary hypertension, unspecified: Secondary | ICD-10-CM

## 2019-12-23 DIAGNOSIS — M329 Systemic lupus erythematosus, unspecified: Secondary | ICD-10-CM

## 2019-12-23 DIAGNOSIS — O0990 Supervision of high risk pregnancy, unspecified, unspecified trimester: Secondary | ICD-10-CM

## 2019-12-23 DIAGNOSIS — O285 Abnormal chromosomal and genetic finding on antenatal screening of mother: Secondary | ICD-10-CM | POA: Insufficient documentation

## 2019-12-23 DIAGNOSIS — E0590 Thyrotoxicosis, unspecified without thyrotoxic crisis or storm: Secondary | ICD-10-CM

## 2019-12-23 NOTE — Patient Instructions (Signed)

## 2019-12-23 NOTE — Progress Notes (Signed)
   PRENATAL VISIT NOTE  Subjective:  Erika Boyer is a 24 y.o. G2P0010 at [redacted]w[redacted]d being seen today for ongoing prenatal care.  She is currently monitored for the following issues for this high-risk pregnancy and has Graves disease; Hyperthyroidism; Morbid obesity with BMI of 40.0-44.9, adult (HCC); Cerebellar degeneration (HCC); Cardiomegaly; Pleural effusion, left; Cerebral atrophy (HCC); MR (mental retardation); Drug-induced lupus erythematosus; Tachycardia; Hypokalemia; Debility; Cognitive disorder; SLE (systemic lupus erythematosus) (HCC); Gait disorder; Anxiety; Headache; S/P pericardiocentesis; Supervision of high risk pregnancy, antepartum; History of chlamydia; Low vitamin D level; Unspecified mood (affective) disorder (HCC); Hypertension; and Abnormal screening blood test for Down syndrome in first trimester on their problem list.  Patient reports no complaints.  Contractions: Not present. Vag. Bleeding: None.  Movement: Absent. Denies leaking of fluid.   The following portions of the patient's history were reviewed and updated as appropriate: allergies, current medications, past family history, past medical history, past social history, past surgical history and problem list.   Objective:   Vitals:   12/23/19 1139  BP: 110/62  Pulse: (!) 105  Weight: (!) 329 lb 12.8 oz (149.6 kg)    Fetal Status: Fetal Heart Rate (bpm): 162   Movement: Absent     General:  Alert, oriented and cooperative. Patient is in no acute distress.  Skin: Skin is warm and dry. No rash noted.   Cardiovascular: Normal heart rate noted  Respiratory: Normal respiratory effort, no problems with respiration noted  Abdomen: Soft, gravid, appropriate for gestational age.  Pain/Pressure: Present     Pelvic: Cervical exam deferred        Extremities: Normal range of motion.  Edema: None  Mental Status: Normal mood and affect. Normal behavior. Normal judgment and thought content.   Assessment and Plan:    Pregnancy: G2P0010 at [redacted]w[redacted]d 1. Supervision of high risk pregnancy, antepartum   2. Secondary hypertension BP nl  3. Systemic lupus erythematosus, unspecified SLE type, unspecified organ involvement status (HCC) On trreatment  4. Hyperthyroidism Normal TSH  5. Abnormal screening blood test for Down syndrome in first trimester [place] marker noted, declined amniocentesis  Preterm labor symptoms and general obstetric precautions including but not limited to vaginal bleeding, contractions, leaking of fluid and fetal movement were reviewed in detail with the patient. Please refer to After Visit Summary for other counseling recommendations.   Return in about 4 weeks (around 01/20/2020).  Future Appointments  Date Time Provider Department Center  01/14/2020 10:45 AM [facility] NURSE [facility]  01/14/2020 11:00 AM [facility] US1 [facility]  01/21/2020 10:15 AM Conan Bowens, MD [facility]    Scheryl Darter, MD

## 2019-12-24 ENCOUNTER — Other Ambulatory Visit: Payer: Self-pay | Admitting: *Deleted

## 2019-12-24 MED ORDER — PROMETHAZINE HCL 25 MG PO TABS
25.0000 mg | ORAL_TABLET | Freq: Four times a day (QID) | ORAL | 0 refills | Status: DC | PRN
Start: 2019-12-24 — End: 2020-02-04

## 2019-12-29 ENCOUNTER — Ambulatory Visit: Payer: Medicare Other | Admitting: Physical Therapy

## 2020-01-04 ENCOUNTER — Encounter: Payer: Self-pay | Admitting: Family Medicine

## 2020-01-04 ENCOUNTER — Encounter: Payer: Medicare Other | Admitting: Physical Therapy

## 2020-01-05 ENCOUNTER — Other Ambulatory Visit: Payer: Medicare Other

## 2020-01-07 ENCOUNTER — Telehealth: Payer: Self-pay

## 2020-01-07 ENCOUNTER — Encounter: Payer: Self-pay | Admitting: *Deleted

## 2020-01-07 ENCOUNTER — Telehealth: Payer: Self-pay | Admitting: *Deleted

## 2020-01-07 NOTE — Telephone Encounter (Signed)
Received [employer] Carrier Screening Report indicating Alaijah is silent carrier for Alpha - Thalassemia and Increased Carrier risk for SMA.  I called Yolonda and notified her of results and recommendation her partner receive partner testing and that she can also receive genetic counseling to explain results in detail. I gave her the number to call. She voices understanding Izabela Ow,RN

## 2020-01-07 NOTE — Telephone Encounter (Addendum)
Call placed to patient regarding SCAT application.  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 She explained that [place]. She ambulates with a walker and has difficulty ambulating due to her cerebellar degeneration.  She stated that she already submitted part A of the application.

## 2020-01-08 ENCOUNTER — Encounter: Payer: Self-pay | Admitting: Family Medicine

## 2020-01-08 NOTE — Telephone Encounter (Signed)
Part B of SCAT application faxed to [employer] [place] eligibility

## 2020-01-14 ENCOUNTER — Other Ambulatory Visit: Payer: Self-pay

## 2020-01-14 ENCOUNTER — Other Ambulatory Visit: Payer: Self-pay | Admitting: *Deleted

## 2020-01-14 ENCOUNTER — Ambulatory Visit: Payer: Medicare Other | Attending: Obstetrics and Gynecology

## 2020-01-14 ENCOUNTER — Ambulatory Visit: Payer: Medicare Other | Admitting: *Deleted

## 2020-01-14 DIAGNOSIS — G319 Degenerative disease of nervous system, unspecified: Secondary | ICD-10-CM | POA: Insufficient documentation

## 2020-01-14 DIAGNOSIS — O10012 Pre-existing essential hypertension complicating pregnancy, second trimester: Secondary | ICD-10-CM | POA: Diagnosis not present

## 2020-01-14 DIAGNOSIS — E0590 Thyrotoxicosis, unspecified without thyrotoxic crisis or storm: Secondary | ICD-10-CM

## 2020-01-14 DIAGNOSIS — Z3A17 17 weeks gestation of pregnancy: Secondary | ICD-10-CM | POA: Diagnosis not present

## 2020-01-14 DIAGNOSIS — M329 Systemic lupus erythematosus, unspecified: Secondary | ICD-10-CM

## 2020-01-14 DIAGNOSIS — O99282 Endocrine, nutritional and metabolic diseases complicating pregnancy, second trimester: Secondary | ICD-10-CM | POA: Diagnosis not present

## 2020-01-14 DIAGNOSIS — Z6841 Body Mass Index (BMI) 40.0 and over, adult: Secondary | ICD-10-CM | POA: Diagnosis not present

## 2020-01-14 DIAGNOSIS — E0500 Thyrotoxicosis with diffuse goiter without thyrotoxic crisis or storm: Secondary | ICD-10-CM

## 2020-01-14 DIAGNOSIS — O26892 Other specified pregnancy related conditions, second trimester: Secondary | ICD-10-CM | POA: Diagnosis not present

## 2020-01-14 DIAGNOSIS — I159 Secondary hypertension, unspecified: Secondary | ICD-10-CM

## 2020-01-14 DIAGNOSIS — O289 Unspecified abnormal findings on antenatal screening of mother: Secondary | ICD-10-CM

## 2020-01-14 DIAGNOSIS — Z363 Encounter for antenatal screening for malformations: Secondary | ICD-10-CM | POA: Diagnosis not present

## 2020-01-14 DIAGNOSIS — O280 Abnormal hematological finding on antenatal screening of mother: Secondary | ICD-10-CM

## 2020-01-14 DIAGNOSIS — E669 Obesity, unspecified: Secondary | ICD-10-CM

## 2020-01-14 DIAGNOSIS — O99212 Obesity complicating pregnancy, second trimester: Secondary | ICD-10-CM | POA: Diagnosis not present

## 2020-01-14 DIAGNOSIS — O0990 Supervision of high risk pregnancy, unspecified, unspecified trimester: Secondary | ICD-10-CM | POA: Diagnosis not present

## 2020-01-19 ENCOUNTER — Telehealth: Payer: Self-pay | Admitting: *Deleted

## 2020-01-19 NOTE — Telephone Encounter (Signed)
patietn dropped off scat form for Dr Jillyn Hidden to complete. Form is placed in Dr [employer] box.

## 2020-01-19 NOTE — Telephone Encounter (Signed)
Spoke to Erskine Squibb, Scientist, physiological.  CMA will fax the part A, part B was faxed.

## 2020-01-21 ENCOUNTER — Encounter: Payer: Self-pay | Admitting: Advanced Practice Midwife

## 2020-01-21 ENCOUNTER — Encounter: Payer: Self-pay | Admitting: Obstetrics and Gynecology

## 2020-01-21 ENCOUNTER — Other Ambulatory Visit (HOSPITAL_COMMUNITY)
Admission: RE | Admit: 2020-01-21 | Discharge: 2020-01-21 | Disposition: A | Discharge status: Home / Self Care | Payer: Medicare Other | Source: Ambulatory Visit | Attending: Obstetrics and Gynecology | Admitting: Obstetrics and Gynecology

## 2020-01-21 ENCOUNTER — Encounter: Payer: Self-pay | Admitting: *Deleted

## 2020-01-21 ENCOUNTER — Ambulatory Visit (INDEPENDENT_AMBULATORY_CARE_PROVIDER_SITE_OTHER): Payer: Medicare Other | Admitting: Obstetrics and Gynecology

## 2020-01-21 ENCOUNTER — Other Ambulatory Visit: Payer: Self-pay

## 2020-01-21 ENCOUNTER — Ambulatory Visit (INDEPENDENT_AMBULATORY_CARE_PROVIDER_SITE_OTHER): Payer: Medicare Other | Admitting: Registered"

## 2020-01-21 VITALS — BP 124/83 | HR 106

## 2020-01-21 DIAGNOSIS — G319 Degenerative disease of nervous system, unspecified: Secondary | ICD-10-CM

## 2020-01-21 DIAGNOSIS — Z713 Dietary counseling and surveillance: Secondary | ICD-10-CM | POA: Insufficient documentation

## 2020-01-21 DIAGNOSIS — O0990 Supervision of high risk pregnancy, unspecified, unspecified trimester: Secondary | ICD-10-CM

## 2020-01-21 DIAGNOSIS — O285 Abnormal chromosomal and genetic finding on antenatal screening of mother: Secondary | ICD-10-CM

## 2020-01-21 DIAGNOSIS — I3139 Other pericardial effusion (noninflammatory): Secondary | ICD-10-CM | POA: Insufficient documentation

## 2020-01-21 DIAGNOSIS — M329 Systemic lupus erythematosus, unspecified: Secondary | ICD-10-CM

## 2020-01-21 DIAGNOSIS — I159 Secondary hypertension, unspecified: Secondary | ICD-10-CM

## 2020-01-21 DIAGNOSIS — N939 Abnormal uterine and vaginal bleeding, unspecified: Secondary | ICD-10-CM

## 2020-01-21 DIAGNOSIS — E0500 Thyrotoxicosis with diffuse goiter without thyrotoxic crisis or storm: Secondary | ICD-10-CM

## 2020-01-21 DIAGNOSIS — Z3A18 18 weeks gestation of pregnancy: Secondary | ICD-10-CM

## 2020-01-21 DIAGNOSIS — I10 Essential (primary) hypertension: Secondary | ICD-10-CM

## 2020-01-21 DIAGNOSIS — I313 Pericardial effusion (noninflammatory): Secondary | ICD-10-CM

## 2020-01-21 DIAGNOSIS — I314 Cardiac tamponade: Secondary | ICD-10-CM

## 2020-01-21 LAB — POCT URINALYSIS DIP (DEVICE)
Bilirubin Urine: NEGATIVE
Bilirubin Urine: NEGATIVE
Glucose, UA: NEGATIVE mg/dL
Glucose, UA: NEGATIVE mg/dL
Ketones, ur: 15 mg/dL — AB
Nitrite: NEGATIVE
Nitrite: NEGATIVE
Protein, ur: 30 mg/dL — AB
Protein, ur: 100 mg/dL — AB
Specific Gravity, Urine: 1.025 (ref 1.005–1.030)
Specific Gravity, Urine: 1.025 (ref 1.005–1.030)
Urobilinogen, UA: 0.2 mg/dL (ref 0.0–1.0)
Urobilinogen, UA: 0.2 mg/dL (ref 0.0–1.0)
pH: 6 (ref 5.0–8.0)
pH: 6 (ref 5.0–8.0)

## 2020-01-21 NOTE — Patient Instructions (Signed)
Appt with Dr. Elizebeth Brooking on 02/03/20 @ 11:30 am [address] [facility], [address] [place], [place] [address]

## 2020-01-21 NOTE — Progress Notes (Signed)
   PRENATAL VISIT NOTE  Subjective:  Erika Boyer is a 24 y.o. G2P0010 at [redacted]w[redacted]d being seen today for ongoing prenatal care.  She is currently monitored for the following issues for this high-risk pregnancy and has Graves disease; Hyperthyroidism; Morbid obesity with BMI of 40.0-44.9, adult (HCC); Cerebellar degeneration (HCC); Cardiomegaly; Pleural effusion, left; Cerebral atrophy (HCC); MR (mental retardation); Drug-induced lupus erythematosus; Tachycardia; Hypokalemia; Debility; Cognitive disorder; SLE (systemic lupus erythematosus) (HCC); Gait disorder; Anxiety; Headache; S/P pericardiocentesis; Supervision of high risk pregnancy, antepartum; History of chlamydia; Low vitamin D level; Unspecified mood (affective) disorder (HCC); Hypertension; Abnormal screening blood test for Down syndrome in first trimester; and Pericardial effusion with cardiac tamponade on their problem list.  Patient reports nausea, some brown discharge.  Contractions: Not present. Vag. Bleeding: Scant.  Movement: Present. Denies leaking of fluid.   The following portions of the patient's history were reviewed and updated as appropriate: allergies, current medications, past family history, past medical history, past social history, past surgical history and problem list.   Objective:   Vitals:   01/21/20 1043  BP: 124/83  Pulse: (!) 106    Fetal Status: Fetal Heart Rate (bpm): 146   Movement: Present     General:  Alert, oriented and cooperative. Patient is in no acute distress.  Skin: Skin is warm and dry. No rash noted.   Cardiovascular: Normal heart rate noted  Respiratory: Normal respiratory effort, no problems with respiration noted  Abdomen: Soft, gravid, appropriate for gestational age.  Pain/Pressure: Absent     Pelvic: Cervical exam deferred        SSE: brown discharge in vagina, no active bleeding  Extremities: Normal range of motion.  Edema: None  Mental Status: Normal mood and affect. Normal  behavior. Normal judgment and thought content.   Assessment and Plan:  Pregnancy: G2P0010 at [redacted]w[redacted]d  1. Supervision of high risk pregnancy, antepartum  2. Secondary hypertension No meds, normotensive today  3. Graves disease No meds  4. Cerebellar degeneration (HCC) To f/u with neurologist  5. Cerebral atrophy (HCC)  6. Systemic lupus erythematosus, unspecified SLE type, unspecified organ involvement status (HCC) Cont plaquenil and prednisone Fetal echo scheduled for 02/03/20 Has f/u with Rheum on 02/04/20  7. Vaginal bleeding No active bleeding noted Brown discharge in vault Swab done  8. Pericardial effusion with cardiac tamponade F/u with cardiology   9. Abnormal screening blood test for Down syndrome in first trimester Declined amniocentesis  10. [redacted] weeks gestation of pregnancy    Preterm labor symptoms and general obstetric precautions including but not limited to vaginal bleeding, contractions, leaking of fluid and fetal movement were reviewed in detail with the patient. Please refer to After Visit Summary for other counseling recommendations.   Return in about 2 weeks (around 02/04/2020) for high OB, in person.  Future Appointments  Date Time Provider Department Center  02/16/2020 11:20 AM [facility] NURSE [facility]  02/16/2020 11:30 AM [facility] US3 [facility]    Conan Bowens, MD

## 2020-01-21 NOTE — Progress Notes (Signed)
Medical Nutrition Therapy:  Appt start time: 1130 end time:  1215  This patient is accompanied in the office by her mother.  Assessment:  Primary concerns today: Obesity in Pregnancy.   12/23/19 329 lbs; 5'5" BMI 54.88  Patient reports eating a variety of fruits and vegetables. Patient reported grain products (rice, pasta, bread), is refined and has not tried 100% whole grain varieties. Patient states she is learning to cook for herself and can cook cabbage, but has not cooked collard greens yet.  Patient's mother added that she eats too much candy, but Patient states last time she had candy was April 20.   Patient was drinking a Sprite during the visit and her mother stated she bought it for her because patient had upset stomach.  Preferred Learning Style: No preference indicated   Learning Readiness: Ready   MEDICATIONS: reviewed   DIETARY INTAKE:  24-hr recall:  B ( AM): eggs, bacon, orange juice OR gritts and fish Snk ( AM):  chips L ( PM): grilled cheese, plantain Snk ( PM): fries D ( PM): Chicken, rice, broccoli Snk ( PM):  Beverages: water with lemon, juice, Sprite for upset stomach  Usual physical activity: not assessed this visit  Estimated energy needs: approx 2300 calories  Progress Towards Goal(s):  New goals.   Nutritional Diagnosis:  NB-1.1 Food and nutrition-related knowledge deficit As related to whole grains.  As evidenced by stated belief that the color of the bread was indication of better choice..    Intervention:  Nutrition Education. Discussed MyPlate and balanced eating. Discussed hunger/fullness cues. Taught difference between starchy and non-starchy vegetables. Discussed whole grains.  Goals   Try 100% whole wheat bread instead of white bread Eat fewer potatoes and [place] fries Eat at a slower pace and pay attention to hunger/fullness cues Consider trying ginger tea for upset stomach instead of soda  Teaching Method Utilized:   Visual Auditory  Handouts given during visit include:  MyPlate for Moms  MyPlate with portions  Barriers to learning/adherence to lifestyle change: none  Demonstrated degree of understanding via:  Teach Back   Monitoring/Evaluation:  Dietary intake, exercise, and body weight prn.

## 2020-01-21 NOTE — Patient Instructions (Addendum)
Goals   Try 100% whole wheat bread instead of white bread Eat fewer potatoes and [place] fries Eat at a slower pace and pay attention to hunger/fullness cues Consider trying ginger tea for upset stomach instead of soda

## 2020-01-21 NOTE — Progress Notes (Signed)
Patient experienced what she described as brown, dried blood when she wiped beginning today. She states it continued coming as she wiped herself after leaving a urine sample.   Mercy Moore, RN

## 2020-01-22 ENCOUNTER — Telehealth: Payer: Self-pay | Admitting: Obstetrics and Gynecology

## 2020-01-22 LAB — CERVICOVAGINAL ANCILLARY ONLY
Bacterial Vaginitis (gardnerella): NEGATIVE
Candida Glabrata: NEGATIVE
Candida Vaginitis: NEGATIVE
Chlamydia: NEGATIVE
Comment: NEGATIVE
Comment: NEGATIVE
Comment: NEGATIVE
Comment: NEGATIVE
Comment: NEGATIVE
Comment: NORMAL
Neisseria Gonorrhea: NEGATIVE
Trichomonas: NEGATIVE

## 2020-01-22 NOTE — Telephone Encounter (Signed)
Called patient to discuss recommendation for lovenox (per rounds yesterday), she asked that I call back later.

## 2020-01-22 NOTE — Telephone Encounter (Signed)
Called patient to discuss recommendation for lovenox, no answer, will send mychart message.   Baldemar Lenis, M.D. Attending [facility] for [employer] [facility])

## 2020-02-01 ENCOUNTER — Telehealth: Payer: Self-pay | Admitting: Family Medicine

## 2020-02-01 NOTE — Telephone Encounter (Signed)
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 thanks

## 2020-02-01 NOTE — Telephone Encounter (Signed)
Pt called stating that her OBGYN advised her to reach out to her neurologist to find out if she is needing to be seen due to her condition and the fact that she is pregnant. Please advise.

## 2020-02-01 NOTE — Telephone Encounter (Signed)
I tried to call pt and her phone was VM full.  I did not send text.  I will try tomorrow.

## 2020-02-01 NOTE — Telephone Encounter (Signed)
I do not have any additional recommendations during pregnancy. She is followed for high risk pregnancy and is approx 20 weeks. I will notify Dr Lucia Gaskins as well to ensure no need for changes in care plan during pregnancy.

## 2020-02-02 ENCOUNTER — Encounter: Payer: Medicare Other | Admitting: Physical Therapy

## 2020-02-02 NOTE — Telephone Encounter (Signed)
I called pt.  Her obgyn  She stated that she has good days and bad days.  Her walking / balance is worse, she is using a walker.  Had a fall 2 wks ago.  She is asking for a note to see if her apartment complex will add wall to wall carpeting due to safety reasons on her first floor apt. I made appt for evaluation 12-04-19 at 1400.

## 2020-02-02 NOTE — Telephone Encounter (Signed)
Noted. Will be happy to assess on 9/16. TY.

## 2020-02-03 ENCOUNTER — Telehealth: Payer: Self-pay | Admitting: Obstetrics and Gynecology

## 2020-02-03 DIAGNOSIS — O351XX0 Maternal care for (suspected) chromosomal abnormality in fetus, not applicable or unspecified: Secondary | ICD-10-CM | POA: Diagnosis not present

## 2020-02-03 DIAGNOSIS — Z3A21 21 weeks gestation of pregnancy: Secondary | ICD-10-CM | POA: Diagnosis not present

## 2020-02-03 NOTE — Telephone Encounter (Signed)
Clled patient to discuss recommendation for lovenox prophylaxis, unable to reach patient (phone not working).   Baldemar Lenis, M.D. [facility]ending [facility] [employer] [facility])

## 2020-02-04 ENCOUNTER — Ambulatory Visit (INDEPENDENT_AMBULATORY_CARE_PROVIDER_SITE_OTHER): Payer: Medicare Other | Admitting: Family Medicine

## 2020-02-04 ENCOUNTER — Encounter: Payer: Self-pay | Admitting: Family Medicine

## 2020-02-04 VITALS — BP 144/83 | HR 106 | Ht 65.0 in | Wt 338.0 lb

## 2020-02-04 DIAGNOSIS — M329 Systemic lupus erythematosus, unspecified: Secondary | ICD-10-CM | POA: Diagnosis not present

## 2020-02-04 DIAGNOSIS — Z6841 Body Mass Index (BMI) 40.0 and over, adult: Secondary | ICD-10-CM | POA: Diagnosis not present

## 2020-02-04 DIAGNOSIS — J90 Pleural effusion, not elsewhere classified: Secondary | ICD-10-CM | POA: Diagnosis not present

## 2020-02-04 DIAGNOSIS — R269 Unspecified abnormalities of gait and mobility: Secondary | ICD-10-CM

## 2020-02-04 DIAGNOSIS — Z79899 Other long term (current) drug therapy: Secondary | ICD-10-CM | POA: Diagnosis not present

## 2020-02-04 DIAGNOSIS — G319 Degenerative disease of nervous system, unspecified: Secondary | ICD-10-CM

## 2020-02-04 DIAGNOSIS — Z3492 Encounter for supervision of normal pregnancy, unspecified, second trimester: Secondary | ICD-10-CM | POA: Diagnosis not present

## 2020-02-04 DIAGNOSIS — M2550 Pain in unspecified joint: Secondary | ICD-10-CM | POA: Diagnosis not present

## 2020-02-04 DIAGNOSIS — I313 Pericardial effusion (noninflammatory): Secondary | ICD-10-CM | POA: Diagnosis not present

## 2020-02-04 NOTE — Progress Notes (Addendum)
PATIENT: Erika Boyer DOB: 09-30-1995  REASON FOR VISIT: follow up HISTORY FROM: patient  Chief Complaint  Patient presents with  . Follow-up    rm 1- Pt said she is here for a f/u on cerebellar degeneration. Pt said she is 22 weeks.pregnant     HISTORY OF PRESENT ILLNESS: Today 02/07/20 Erika Boyer is a 24 y.o. female here today for follow up. She has history of cerebral degeneration. She is [redacted] weeks pregnant. She called last week and asked if any needed changes in care due to pregnancy. No recommendations in change were needed. She returns today to discuss concerns of worsening balance and falls. She feels that she is having more trouble balancing. She has gained 30 pounds since last being seen in 06/2019. She has stopped going to the gym. She feels that people at the gym are insensitive to her condition. She has fell 3 times over the past three months. All three falls were in her apartment. She reports that her legs will slip out form underneath her. She is concerned that this is worse due to having wood flooring. She has participated in PT in the past and felt that she needed more of a workout. She is hesitant to consider this but her mother is adamant that she consider this. She is also asking for a letter of accomodation so that she can get carpet in her apartment.    HISTORY: (copied from my note on 07/08/2019)  Erika Boyer is a 24 y.o. female here today for follow up for cerebral degeneration. She is doing fairly well. She is anxious about gaining weight. She has been less active due to pandemic. She is wanting to be considered for bariatric surgery but is not sure she can follow the recommended diet and follow up as advised. She is working with PCP to establish a plan. She has an upcoming appt with Dr Jillyn Hidden next month. She is using her Rolator most of the time. She denies recent falls. She did have a fall in the summer but reports that it was due to her being "careless." She  is working with PT and OT.   HISTORY: (copied from [employer] note on 07/02/2018)  UPDATE2/12/2020CMMs.Yassin,24 year old female returns for follow-up with a history of postviral cerebellar degeneration and anxiety. She has a new complaint today of headaches. She eats a lot of fast food according to the mom which would contribute to that problem She also takes a lot of ibuprofen for joint pain,more than the recommended dose according to the mom and patient was made aware that this can cause rebound headache. She does not exercise. She has occasional dizziness.She denies difficulty sleeping. Mom provides most of the history. She has a history of lupus and is on Plaquenil. She returns for reevaluation  Interval history 06/18/2017:AANew problem today. Anxiety is worsening. As a result of her Illness and disability.She is very anxious, her disability affectes her, she is self conscious. She has mood swings due to this and gets agitated. Since the cat died she is having more emotional problems. Animals calm her, she is happier with animals and more interactive. She improved in the past. Recently she is more moody, more quick to anger. Lots of anxiety. She is having difficulty sleeping due to anxiety. She gets upset. Mother is here and provides most information. Patient declines any medication management for anxiety.  WUJ:WJXBJY Erika Boyer a 24 y.o.femalehere as a referral from Dr. Mariel Aloe cerebellar atrophy. Past medical history of morbid obesity,  acute kidney injury, drug-induced lupus erythematosus, cognitive disorder, ligament hypertension, cerebellar atrophy. 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 MRI indicated cerebellar/pontine degeneration. Patient was worked up in the past possibly with genetics not clear from records. Cerebellar degeneration of unknown  etiology, multiple needs. She has been evaluated at [facility] in [place]. She was seeing neurology for about 5 years there. They did genetic testing. She was diagnosed with post-viral cerebellar degeneration. She just started falling at the age 41. She last fell a few days ago. She has physical therapy to her house. Since being out of the hospital she feels better. She is taking a lot of medication. Physical therapy is helping. She feels her balance is better. Her writing is better. She gets tremors, they are off and on. She gets tremors when she tries to concentrate. They are in her head. The tremor does not bother her. It doesn't affect her, she gets double which is chronic, no problems speaking, she speaks slowly. She graduated HS. She didn;t like college   REVIEW OF SYSTEMS: Out of a complete 14 system review of symptoms, the patient complains only of the following symptoms, cognitive impairment, imbalance, falls, anxiety and all other reviewed systems are negative.   ALLERGIES: Allergies  Allergen Reactions  . Shellfish Allergy Anaphylaxis  . Morphine And Related Itching  . Mushroom Extract Complex Other (See Comments)    Mother is allergic, so patient avoids these  . Tapazole [Methimazole] Swelling and Rash    WELTS (also) Mother suspects this MAY be an allergy, as the onset of symptoms coincided with this being started    HOME MEDICATIONS: Outpatient Medications Prior to Visit  Medication Sig Dispense Refill  . acetaminophen (TYLENOL) 500 MG tablet Take 1 tablet (500 mg total) by mouth every 6 (six) hours as needed. 30 tablet 0  . albuterol (PROAIR HFA) 108 (90 Base) MCG/ACT inhaler Inhale 2 puffs into the lungs every 4 (four) hours as needed for wheezing or shortness of breath.     Marland Kitchen aspirin EC 81 MG tablet Take 1 tablet (81 mg total) by mouth daily. Take after 12 weeks for prevention of preeclampsia later in pregnancy 300 tablet 2  . cefadroxil (DURICEF) 500 MG capsule Take 1 capsule  (500 mg total) by mouth 2 (two) times daily. 14 capsule 0  . EPINEPHrine 0.3 mg/0.3 mL IJ SOAJ injection USE AS DIRECTED AND THEN CALL 911    . hydroxychloroquine (PLAQUENIL) 200 MG tablet TK 2 TS PO QD WF OR MILK  2  . predniSONE (DELTASONE) 1 MG tablet Take 1 tablet (1 mg total) by mouth daily with breakfast. 90 tablet 0  . prenatal vitamin w/FE, FA (PRENATAL 1 + 1) 27-1 MG TABS tablet Take 1 tablet by mouth daily at 12 noon. 30 tablet 12  . pantoprazole (PROTONIX) 20 MG tablet Take 1 tablet (20 mg total) by mouth 2 (two) times daily. 60 tablet 2  . promethazine (PHENERGAN) 25 MG tablet Take 1 tablet (25 mg total) by mouth every 6 (six) hours as needed for nausea or vomiting. 30 tablet 0   No facility-administered medications prior to visit.    PAST MEDICAL HISTORY: Past Medical History:  Diagnosis Date  . Anemia   . Anxiety   . Arthritis    "right knee" (10/11/2016)  . 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 Cervical high risk HPV (human papillomavirus) test positive 03/20/2017  Pap q year  . Chronic bronchitis (HCC)   . Daily headache   . Depression   . Graves disease 2012   hx; "was on RX; it disappeared" (10/11/2016)  . History of blood transfusion    "related to the anemia" (10/11/2016)  . Hypertension   . Lupus (HCC)    "not sure what kind" (10/11/2016)  . Microcytic anemia 10/03/2016  . Pneumonia 10/03/2016    PAST SURGICAL HISTORY: Past Surgical History:  Procedure Laterality Date  . PERICARDIOCENTESIS N/A 10/04/2016   Procedure: Pericardiocentesis;  Surgeon: Yates DecampGanji, Jay, MD;  [facility];  Service: Cardiovascular;  Laterality: N/A;  . REDUCTION MAMMAPLASTY Bilateral 2012  . TONSILLECTOMY      FAMILY HISTORY: Family History  Problem Relation Age of Onset  . Thalassemia Mother   . Arthritis Mother   . Breast cancer Mother        Mastectomy in early 4030's.    . High blood pressure Father   . Mental illness Father        question bipolar disorder per  wife  . Sickle cell trait Maternal Aunt   . Breast cancer Maternal Aunt        4 maternal aunts w/breast cancer  . Breast cancer Maternal Grandmother        Bone  . Multiple sclerosis Cousin   . Diabetes Paternal Grandfather     SOCIAL HISTORY: Social History   Socioeconomic History  . Marital status: Single    Spouse name: Not on file  . Number of children: 0  . Years of education: 1614  . Highest education level: Some college, no degree  Occupational History  . Not on file  Tobacco Use  . Smoking status: Never Smoker  . Smokeless tobacco: Never Used  Vaping Use  . Vaping Use: Never used  Substance and Sexual Activity  . Alcohol use: No  . Drug use: No  . Sexual activity: Yes  Other Topics Concern  . Not on file  Social History Narrative   Lives at home w/ her mom   Right-handed   Caffeine: occasional coffee   Social Determinants of Health   Financial Resource Strain:   . Difficulty of Paying Living Expenses: Not on file  Food Insecurity: No Food Insecurity  . Worried About Programme researcher, broadcasting/film/videounning Out of Food in the Last Year: Never true  . Ran Out of Food in the Last Year: Never true  Transportation Needs: No Transportation Needs  . Lack of Transportation (Medical): No  . Lack of Transportation (Non-Medical): No  Physical Activity:   . Days of Exercise per Week: Not on file  . Minutes of Exercise per Session: Not on file  Stress:   . Feeling of Stress : Not on file  Social Connections:   . Frequency of Communication with Friends and Family: Not on file  . Frequency of Social Gatherings with Friends and Family: Not on file  . Attends Religious Services: Not on file  . Active Member of Clubs or Organizations: Not on file  . Attends BankerClub or Organization Meetings: Not on file  . Marital Status: Not on file  Intimate Partner Violence:   . Fear of Current or Ex-Partner: Not on file  . Emotionally Abused: Not on file  . Physically Abused: Not on file  . Sexually Abused: Not on  file      PHYSICAL EXAM  Vitals:   02/04/20 1357  BP: (!) 144/83  Pulse: (!) 106  Weight: Marland Kitchen(!)  338 lb (153.3 kg)  Height: 5\' 5"  (1.651 m)   Body mass index is 56.25 kg/m.  Generalized: Well developed, in no acute distress  Cardiology: normal rate and rhythm, no murmur noted Respiratory: clear to auscultation bilaterally  Neurological examination  Mentation: Alert oriented to time, place, history taking. Follows all commands speech and language fluent, some delayed responses, emotionally labile, bursts into tears when talking about feeling judged at the gym.  Cranial nerve II-XII: Pupils were equal round reactive to light. Extraocular movements were full, visual field were full on confrontational test. Facial sensation and strength were normal. Head turning and shoulder shrug  were normal and symmetric. Motor: The motor testing reveals 5 over 5 strength of all 4 extremities. Good symmetric motor tone is noted throughout.  Sensory: Sensory testing is intact to soft touch on all 4 extremities. No evidence of extinction is noted.  Gait and station: Gait is wide, partially due to body habitus, BMI 56, slightly unsteady without assistive device, she did not bring her walker as it would not fit in her mothers car. Tandem not attempted. She is impulsive in standing and quick turns.   DIAGNOSTIC DATA (LABS, IMAGING, TESTING) - I reviewed patient records, labs, notes, testing and imaging myself where available.  No flowsheet data found.   Lab Results  Component Value Date   WBC 4.1 11/25/2019   HGB 10.9 (L) 11/25/2019   HCT 35.2 11/25/2019   MCV 83 11/25/2019   PLT 248 11/25/2019      Component Value Date/Time   NA 131 (L) 11/25/2019 1158   K 4.0 11/25/2019 1158   CL 98 11/25/2019 1158   CO2 21 11/25/2019 1158   GLUCOSE 71 11/25/2019 1158   GLUCOSE 91 10/24/2016 0539   BUN 7 11/25/2019 1158   CREATININE 0.67 11/25/2019 1158   CALCIUM 9.2 11/25/2019 1158   PROT 7.0 11/25/2019  1158   ALBUMIN 3.4 (L) 11/25/2019 1158   AST 15 11/25/2019 1158   ALT 13 11/25/2019 1158   ALKPHOS 52 11/25/2019 1158   BILITOT <0.2 11/25/2019 1158   GFRNONAA 124 11/25/2019 1158   GFRAA 143 11/25/2019 1158   Lab Results  Component Value Date   CHOL 82 10/04/2016   TRIG 139 10/03/2016   Lab Results  Component Value Date   HGBA1C 5.1 11/25/2019   Lab Results  Component Value Date   VITAMINB12 275 05/07/2017   Lab Results  Component Value Date   TSH 1.650 11/25/2019     ASSESSMENT AND PLAN 24 y.o. year old female  has a past medical history of Anemia, Anxiety, Arthritis, Cerebellar degeneration (HCC), Cervical high risk HPV (human papillomavirus) test positive (03/20/2017), Chronic bronchitis (HCC), Daily headache, Depression, Graves disease (2012), History of blood transfusion, Hypertension, Lupus (HCC), Microcytic anemia (10/03/2016), and Pneumonia (10/03/2016). here with     ICD-10-CM   1. Cerebellar degeneration (HCC)  G31.9 Ambulatory referral to Physical Therapy  2. Morbid obesity with BMI of 40.0-44.9, adult (HCC)  E66.01 Ambulatory referral to [facility]   Z68.41   3. Gait disorder  R26.9 Ambulatory referral to Physical Therapy    [facility] is now [redacted] weeks pregnant. She reports worsening balance over the past 2-3 months resulting in three falls. I am concerned that this is multifactorial. Cerebellar degeneration could most defiantly contribute. Patient has gained 30 pounds with pregnancy, BMI >56. It is unclear how much she understands regarding safety and fall precautions. She is not using her walker today but reports using  it at home. I have offered to send her to PT for evaluation and treatment. I am concerned about her unwillingness to participate, however, her mother is adamant that she try it. She was advised to provide me with more specific information from her apartment for accommodations to flooring. I will discuss with Dr Lucia Gaskins and PT pending evaluation. She  was encouraged to follow up closely with OB. We will follow up in 6-12 months pending PT eval. She and her mother verbalize understanding and agreement with this plan.    Addendum: Following discussion with Dr Lucia Gaskins, I have also placed referral to [facility]. I will send updated notes to PCP. No follow up needed with neurology. Patient updated on plan of care.    Orders Placed This Encounter  Procedures  . Ambulatory referral to Physical Therapy    Referral Priority:   Routine    Referral Type:   Physical Medicine    Referral Reason:   Specialty Services Required    Requested Specialty:   Physical Therapy    Number of Visits Requested:   1  . Ambulatory referral to Family Practice    Referral Priority:   Routine    Referral Type:   Consultation    Referral Reason:   Specialty Services Required    Referred to Provider:   Wilder Glade, MD    Requested Specialty:   [facility]    Number of Visits Requested:   1     No orders of the defined types were placed in this encounter.     I spent 30 minutes with the patient. 50% of this time was spent counseling and educating patient on plan of care and medications.     Shawnie Dapper, FNP-C 02/07/2020, 1:07 PM [facility] [address], [address] [place], [place] [address] [phone]  Made any corrections needed, and agree with history, physical, neuro exam,assessment and plan as stated.     Naomie Dean, MD [facility]

## 2020-02-04 NOTE — Patient Instructions (Signed)
Please continue close follow up with OB. I will send an order for PT. Please get me more information regarding apartment's request for letter of accomodation and I will review with Dr Lucia Gaskins.   Follow up annually  Fall Prevention in the Home, Adult Falls can cause injuries. They can happen to people of all ages. There are many things you can do to make your home safe and to help prevent falls. Ask for help when making these changes, if needed. What actions can I take to prevent falls? General Instructions  Use good lighting in all rooms. Replace any light bulbs that burn out.  Turn on the lights when you go into a dark area. Use night-lights.  Keep items that you use often in easy-to-reach places. Lower the shelves around your home if necessary.  Set up your furniture so you have a clear path. Avoid moving your furniture around.  Do not have throw rugs and other things on the floor that can make you trip.  Avoid walking on wet floors.  If any of your floors are uneven, fix them.  Add color or contrast paint or tape to clearly mark and help you see: ? Any grab bars or handrails. ? First and last steps of stairways. ? Where the edge of each step is.  If you use a stepladder: ? Make sure that it is fully opened. Do not climb a closed stepladder. ? Make sure that both sides of the stepladder are locked into place. ? Ask someone to hold the stepladder for you while you use it.  If there are any pets around you, be aware of where they are. What can I do in the bathroom?      Keep the floor dry. Clean up any water that spills onto the floor as soon as it happens.  Remove soap buildup in the tub or shower regularly.  Use non-skid mats or decals on the floor of the tub or shower.  Attach bath mats securely with double-sided, non-slip rug tape.  If you need to sit down in the shower, use a plastic, non-slip stool.  Install grab bars by the toilet and in the tub and shower. Do  not use towel bars as grab bars. What can I do in the bedroom?  Make sure that you have a light by your bed that is easy to reach.  Do not use any sheets or blankets that are too big for your bed. They should not hang down onto the floor.  Have a firm chair that has side arms. You can use this for support while you get dressed. What can I do in the kitchen?  Clean up any spills right away.  If you need to reach something above you, use a strong step stool that has a grab bar.  Keep electrical cords out of the way.  Do not use floor polish or wax that makes floors slippery. If you must use wax, use non-skid floor wax. What can I do with my stairs?  Do not leave any items on the stairs.  Make sure that you have a light switch at the top of the stairs and the bottom of the stairs. If you do not have them, ask someone to add them for you.  Make sure that there are handrails on both sides of the stairs, and use them. Fix handrails that are broken or loose. Make sure that handrails are as long as the stairways.  Install non-slip stair treads  on all stairs in your home.  Avoid having throw rugs at the top or bottom of the stairs. If you do have throw rugs, attach them to the floor with carpet tape.  Choose a carpet that does not hide the edge of the steps on the stairway.  Check any carpeting to make sure that it is firmly attached to the stairs. Fix any carpet that is loose or worn. What can I do on the outside of my home?  Use bright outdoor lighting.  Regularly fix the edges of walkways and driveways and fix any cracks.  Remove anything that might make you trip as you walk through a door, such as a raised step or threshold.  Trim any bushes or trees on the path to your home.  Regularly check to see if handrails are loose or broken. Make sure that both sides of any steps have handrails.  Install guardrails along the edges of any raised decks and porches.  Clear walking paths  of anything that might make someone trip, such as tools or rocks.  Have any leaves, snow, or ice cleared regularly.  Use sand or salt on walking paths during winter.  Clean up any spills in your garage right away. This includes grease or oil spills. What other actions can I take?  Wear shoes that: ? Have a low heel. Do not wear high heels. ? Have rubber bottoms. ? Are comfortable and fit you well. ? Are closed at the toe. Do not wear open-toe sandals.  Use tools that help you move around (mobility aids) if they are needed. These include: ? Canes. ? Walkers. ? Scooters. ? Crutches.  Review your medicines with your doctor. Some medicines can make you feel dizzy. This can increase your chance of falling. Ask your doctor what other things you can do to help prevent falls. Where to find more information  Centers for Disease Control and Prevention, STEADI: HealthcareCounselor.com.pt  [employer] on Aging: RingConnections.si Contact a doctor if:  You are afraid of falling at home.  You feel weak, drowsy, or dizzy at home.  You fall at home. Summary  There are many simple things that you can do to make your home safe and to help prevent falls.  Ways to make your home safe include removing tripping hazards and installing grab bars in the bathroom.  Ask for help when making these changes in your home. This information is not intended to replace advice given to you by your health care provider. Make sure you discuss any questions you have with your health care provider. Document Revised: 08/28/2018 Document Reviewed: 12/20/2016 Elsevier Patient Education  2020 [employer].

## 2020-02-07 NOTE — Addendum Note (Signed)
Addended byNicholas Lose, Jojuan Champney L on: 02/07/2020 01:08 PM   Modules accepted: Orders

## 2020-02-08 ENCOUNTER — Encounter (INDEPENDENT_AMBULATORY_CARE_PROVIDER_SITE_OTHER): Payer: Self-pay

## 2020-02-09 ENCOUNTER — Encounter (INDEPENDENT_AMBULATORY_CARE_PROVIDER_SITE_OTHER): Payer: Self-pay

## 2020-02-09 ENCOUNTER — Telehealth: Payer: Self-pay | Admitting: *Deleted

## 2020-02-09 NOTE — Telephone Encounter (Signed)
Attempted to call pt and was not able to LVM, not set up.  Will try later.

## 2020-02-09 NOTE — Telephone Encounter (Signed)
Called pt and relayed that per AL/NP after speaking with Dr. Lucia Gaskins, that would do referrals to PT and [facility].  From neurostandpoint no need to follow up with [place].  Continue with OB visits as receommended.

## 2020-02-10 NOTE — Progress Notes (Signed)
See other note did contact pt with addendum information.

## 2020-02-11 ENCOUNTER — Ambulatory Visit (INDEPENDENT_AMBULATORY_CARE_PROVIDER_SITE_OTHER): Payer: Medicare Other | Admitting: Obstetrics & Gynecology

## 2020-02-11 ENCOUNTER — Telehealth: Payer: Self-pay

## 2020-02-11 ENCOUNTER — Other Ambulatory Visit: Payer: Self-pay

## 2020-02-11 ENCOUNTER — Encounter: Payer: Self-pay | Admitting: *Deleted

## 2020-02-11 VITALS — BP 131/77 | Wt 340.1 lb

## 2020-02-11 DIAGNOSIS — O358XX0 Maternal care for other (suspected) fetal abnormality and damage, not applicable or unspecified: Secondary | ICD-10-CM

## 2020-02-11 DIAGNOSIS — O285 Abnormal chromosomal and genetic finding on antenatal screening of mother: Secondary | ICD-10-CM

## 2020-02-11 DIAGNOSIS — O0990 Supervision of high risk pregnancy, unspecified, unspecified trimester: Secondary | ICD-10-CM

## 2020-02-11 DIAGNOSIS — M329 Systemic lupus erythematosus, unspecified: Secondary | ICD-10-CM

## 2020-02-11 DIAGNOSIS — O35BXX0 Maternal care for other (suspected) fetal abnormality and damage, fetal cardiac anomalies, not applicable or unspecified: Secondary | ICD-10-CM

## 2020-02-11 MED ORDER — FERROUS SULFATE 325 (65 FE) MG PO TABS: 325.0000 mg | ORAL_TABLET | Freq: Every day | ORAL | 3 refills | Status: DC

## 2020-02-11 MED ORDER — PANTOPRAZOLE SODIUM 20 MG PO TBEC: 20.0000 mg | DELAYED_RELEASE_TABLET | Freq: Every day | ORAL | 3 refills | Status: DC

## 2020-02-11 NOTE — Telephone Encounter (Signed)
Call placed to patient regarding SCAT application.  2 pages from part A were missing when part A was submitted.  The patient gave this CM permission to speak to her mother to obtain the missing information. Competed SCAT application faxed to [employer] [place] eligibility

## 2020-02-11 NOTE — Progress Notes (Signed)
Fetal ECHO done on 02/03/20, f/u visit scheduled for 03/24/20.

## 2020-02-11 NOTE — Patient Instructions (Signed)

## 2020-02-11 NOTE — Progress Notes (Signed)
   PRENATAL VISIT NOTE  Subjective:  Erika Boyer is a 24 y.o. G2P0010 at [redacted]w[redacted]d being seen today for ongoing prenatal care.  She is currently monitored for the following issues for this high-risk pregnancy and has Graves disease; Hyperthyroidism; Morbid obesity with BMI of 40.0-44.9, adult (HCC); Cerebellar degeneration (HCC); Cardiomegaly; Pleural effusion, left; Cerebral atrophy (HCC); MR (mental retardation); Drug-induced lupus erythematosus; Tachycardia; Hypokalemia; Debility; Cognitive disorder; SLE (systemic lupus erythematosus) (HCC); Gait disorder; Anxiety; Headache; S/P pericardiocentesis; Supervision of high risk pregnancy, antepartum; History of chlamydia; Low vitamin D level; Unspecified mood (affective) disorder (HCC); Hypertension; Abnormal screening blood test for Down syndrome in first trimester; Pericardial effusion with cardiac tamponade; 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  Patient reports heartburn and nausea.  Contractions: Not present. Vag. Bleeding: None.  Movement: Present. Denies leaking of fluid.   The following portions of the patient's history were reviewed and updated as appropriate: allergies, current medications, past family history, past medical history, past social history, past surgical history and problem list.   Objective:   Vitals:   02/11/20 1119  BP: 131/77  Weight: (!) 340 lb 1.6 oz (154.3 kg)    Fetal Status: Fetal Heart Rate (bpm): 147 Fundal Height: 22 cm Movement: Present     General:  Alert, oriented and cooperative. Patient is in no acute distress.  Skin: Skin is warm and dry. No rash noted.   Cardiovascular: Normal heart rate noted  Respiratory: Normal respiratory effort, no problems with respiration noted  Abdomen: Soft, gravid, appropriate for gestational age.  Pain/Pressure: Present     Pelvic: Cervical exam deferred        Extremities: Normal range of motion.  Edema: Trace    Mental Status: Normal mood and affect. Normal behavior. Normal judgment and thought content.   Assessment and Plan:  Pregnancy: G2P0010 at [redacted]w[redacted]d 1. Supervision of high risk pregnancy, antepartum  - ferrous sulfate 325 (65 FE) MG tablet; Take 1 tablet (325 mg total) by mouth daily with breakfast.  Dispense: 30 tablet; Refill: 3  2. Systemic lupus erythematosus, unspecified SLE type, unspecified organ involvement status (HCC) Plaquenil, FE  - ferrous sulfate 325 (65 FE) MG tablet; Take 1 tablet (325 mg total) by mouth daily with breakfast.  Dispense: 30 tablet; Refill: 3 Need f/u fetal echo in 6 weeks for VSD Preterm labor symptoms and general obstetric precautions including but not limited to vaginal bleeding, contractions, leaking of fluid and fetal movement were reviewed in detail with the patient. Please refer to After Visit Summary for other counseling recommendations.   Return in about 4 weeks (around 03/10/2020) for ROI from Dr. Nickola Major.  Future Appointments  Date Time Provider Department Center  02/16/2020 11:20 AM [facility] NURSE [facility]  02/16/2020 11:30 AM [facility] US3 [facility]    Scheryl Darter, MD

## 2020-02-16 ENCOUNTER — Ambulatory Visit: Payer: Medicare Other | Attending: Obstetrics and Gynecology

## 2020-02-16 ENCOUNTER — Ambulatory Visit: Payer: Medicare Other | Admitting: *Deleted

## 2020-02-16 ENCOUNTER — Other Ambulatory Visit: Payer: Self-pay | Admitting: *Deleted

## 2020-02-16 ENCOUNTER — Encounter: Payer: Self-pay | Admitting: *Deleted

## 2020-02-16 ENCOUNTER — Other Ambulatory Visit: Payer: Self-pay

## 2020-02-16 DIAGNOSIS — O0990 Supervision of high risk pregnancy, unspecified, unspecified trimester: Secondary | ICD-10-CM | POA: Insufficient documentation

## 2020-02-16 DIAGNOSIS — O99212 Obesity complicating pregnancy, second trimester: Secondary | ICD-10-CM

## 2020-02-16 DIAGNOSIS — M329 Systemic lupus erythematosus, unspecified: Secondary | ICD-10-CM | POA: Diagnosis not present

## 2020-02-16 DIAGNOSIS — I159 Secondary hypertension, unspecified: Secondary | ICD-10-CM | POA: Insufficient documentation

## 2020-02-16 DIAGNOSIS — E039 Hypothyroidism, unspecified: Secondary | ICD-10-CM | POA: Diagnosis not present

## 2020-02-16 DIAGNOSIS — E669 Obesity, unspecified: Secondary | ICD-10-CM | POA: Diagnosis not present

## 2020-02-16 DIAGNOSIS — O280 Abnormal hematological finding on antenatal screening of mother: Secondary | ICD-10-CM | POA: Diagnosis not present

## 2020-02-16 DIAGNOSIS — O10913 Unspecified pre-existing hypertension complicating pregnancy, third trimester: Secondary | ICD-10-CM | POA: Diagnosis not present

## 2020-02-16 DIAGNOSIS — O26892 Other specified pregnancy related conditions, second trimester: Secondary | ICD-10-CM | POA: Diagnosis not present

## 2020-02-16 DIAGNOSIS — O99282 Endocrine, nutritional and metabolic diseases complicating pregnancy, second trimester: Secondary | ICD-10-CM | POA: Diagnosis not present

## 2020-02-16 DIAGNOSIS — Z362 Encounter for other antenatal screening follow-up: Secondary | ICD-10-CM | POA: Diagnosis not present

## 2020-02-16 DIAGNOSIS — Z3A22 22 weeks gestation of pregnancy: Secondary | ICD-10-CM | POA: Diagnosis not present

## 2020-02-16 DIAGNOSIS — I10 Essential (primary) hypertension: Secondary | ICD-10-CM | POA: Diagnosis not present

## 2020-02-16 DIAGNOSIS — O365990 Maternal care for other known or suspected poor fetal growth, unspecified trimester, not applicable or unspecified: Secondary | ICD-10-CM

## 2020-03-08 ENCOUNTER — Encounter: Payer: Self-pay | Admitting: Advanced Practice Midwife

## 2020-03-08 DIAGNOSIS — Z6841 Body Mass Index (BMI) 40.0 and over, adult: Secondary | ICD-10-CM | POA: Insufficient documentation

## 2020-03-09 ENCOUNTER — Ambulatory Visit: Payer: Medicare Other

## 2020-03-09 ENCOUNTER — Encounter: Payer: Medicare Other | Admitting: Obstetrics & Gynecology

## 2020-03-09 NOTE — Telephone Encounter (Unsigned)
Copied from CRM [id number]. Topic: General - Inquiry >> Mar 08, 2020 10:50 AM Leary Roca wrote: Reason for CRM: Pt wanted to come by office to pick up paperwork from [facility] that was supposed to faxed over . She would like to pick up from front desk please place paperwork in an envelope for pck today around 1pm

## 2020-03-14 ENCOUNTER — Encounter: Payer: Self-pay | Admitting: *Deleted

## 2020-03-18 ENCOUNTER — Ambulatory Visit: Payer: Medicare Other | Admitting: *Deleted

## 2020-03-18 ENCOUNTER — Encounter (HOSPITAL_COMMUNITY): Payer: Self-pay | Admitting: Certified Registered Nurse Anesthetist

## 2020-03-18 ENCOUNTER — Encounter (HOSPITAL_COMMUNITY): Payer: Self-pay | Admitting: Obstetrics and Gynecology

## 2020-03-18 ENCOUNTER — Ambulatory Visit (HOSPITAL_BASED_OUTPATIENT_CLINIC_OR_DEPARTMENT_OTHER): Payer: Medicare Other

## 2020-03-18 ENCOUNTER — Encounter: Payer: Self-pay | Admitting: *Deleted

## 2020-03-18 ENCOUNTER — Other Ambulatory Visit: Payer: Self-pay

## 2020-03-18 ENCOUNTER — Inpatient Hospital Stay (HOSPITAL_COMMUNITY)
Admission: AD | Admit: 2020-03-18 | Discharge: 2020-03-20 | DRG: 806 | Disposition: A | Discharge status: Home / Self Care | Payer: Medicare Other | Attending: Obstetrics and Gynecology | Admitting: Obstetrics and Gynecology

## 2020-03-18 DIAGNOSIS — Z3A26 26 weeks gestation of pregnancy: Secondary | ICD-10-CM

## 2020-03-18 DIAGNOSIS — O351XX0 Maternal care for (suspected) chromosomal abnormality in fetus, not applicable or unspecified: Secondary | ICD-10-CM | POA: Diagnosis present

## 2020-03-18 DIAGNOSIS — O365990 Maternal care for other known or suspected poor fetal growth, unspecified trimester, not applicable or unspecified: Secondary | ICD-10-CM | POA: Insufficient documentation

## 2020-03-18 DIAGNOSIS — O364XX0 Maternal care for intrauterine death, not applicable or unspecified: Secondary | ICD-10-CM | POA: Diagnosis not present

## 2020-03-18 DIAGNOSIS — O10912 Unspecified pre-existing hypertension complicating pregnancy, second trimester: Secondary | ICD-10-CM

## 2020-03-18 DIAGNOSIS — F79 Unspecified intellectual disabilities: Secondary | ICD-10-CM

## 2020-03-18 DIAGNOSIS — M329 Systemic lupus erythematosus, unspecified: Secondary | ICD-10-CM

## 2020-03-18 DIAGNOSIS — O0990 Supervision of high risk pregnancy, unspecified, unspecified trimester: Secondary | ICD-10-CM | POA: Insufficient documentation

## 2020-03-18 DIAGNOSIS — D649 Anemia, unspecified: Secondary | ICD-10-CM | POA: Diagnosis not present

## 2020-03-18 DIAGNOSIS — O99892 Other specified diseases and conditions complicating childbirth: Secondary | ICD-10-CM | POA: Diagnosis present

## 2020-03-18 DIAGNOSIS — I10 Essential (primary) hypertension: Secondary | ICD-10-CM | POA: Diagnosis present

## 2020-03-18 DIAGNOSIS — O26892 Other specified pregnancy related conditions, second trimester: Secondary | ICD-10-CM

## 2020-03-18 DIAGNOSIS — O43893 Other placental disorders, third trimester: Secondary | ICD-10-CM | POA: Diagnosis not present

## 2020-03-18 DIAGNOSIS — E0590 Thyrotoxicosis, unspecified without thyrotoxic crisis or storm: Secondary | ICD-10-CM

## 2020-03-18 DIAGNOSIS — Z20822 Contact with and (suspected) exposure to covid-19: Secondary | ICD-10-CM | POA: Diagnosis present

## 2020-03-18 DIAGNOSIS — O99214 Obesity complicating childbirth: Secondary | ICD-10-CM | POA: Diagnosis present

## 2020-03-18 DIAGNOSIS — O321XX0 Maternal care for breech presentation, not applicable or unspecified: Secondary | ICD-10-CM | POA: Diagnosis present

## 2020-03-18 DIAGNOSIS — E669 Obesity, unspecified: Secondary | ICD-10-CM

## 2020-03-18 DIAGNOSIS — Z6841 Body Mass Index (BMI) 40.0 and over, adult: Secondary | ICD-10-CM

## 2020-03-18 DIAGNOSIS — Z3A00 Weeks of gestation of pregnancy not specified: Secondary | ICD-10-CM | POA: Diagnosis not present

## 2020-03-18 DIAGNOSIS — G319 Degenerative disease of nervous system, unspecified: Secondary | ICD-10-CM | POA: Diagnosis present

## 2020-03-18 DIAGNOSIS — O99212 Obesity complicating pregnancy, second trimester: Secondary | ICD-10-CM | POA: Diagnosis not present

## 2020-03-18 DIAGNOSIS — O365930 Maternal care for other known or suspected poor fetal growth, third trimester, not applicable or unspecified: Secondary | ICD-10-CM

## 2020-03-18 DIAGNOSIS — O9902 Anemia complicating childbirth: Secondary | ICD-10-CM | POA: Diagnosis not present

## 2020-03-18 DIAGNOSIS — O1002 Pre-existing essential hypertension complicating childbirth: Secondary | ICD-10-CM | POA: Diagnosis present

## 2020-03-18 DIAGNOSIS — O99282 Endocrine, nutritional and metabolic diseases complicating pregnancy, second trimester: Secondary | ICD-10-CM

## 2020-03-18 DIAGNOSIS — O3513X0 Maternal care for (suspected) chromosomal abnormality in fetus, trisomy 21, not applicable or unspecified: Secondary | ICD-10-CM | POA: Diagnosis present

## 2020-03-18 DIAGNOSIS — O164 Unspecified maternal hypertension, complicating childbirth: Secondary | ICD-10-CM | POA: Diagnosis not present

## 2020-03-18 DIAGNOSIS — Z362 Encounter for other antenatal screening follow-up: Secondary | ICD-10-CM | POA: Diagnosis not present

## 2020-03-18 DIAGNOSIS — O35BXX0 Maternal care for other (suspected) fetal abnormality and damage, fetal cardiac anomalies, not applicable or unspecified: Secondary | ICD-10-CM | POA: Diagnosis present

## 2020-03-18 DIAGNOSIS — O289 Unspecified abnormal findings on antenatal screening of mother: Secondary | ICD-10-CM | POA: Diagnosis not present

## 2020-03-18 DIAGNOSIS — O358XX0 Maternal care for other (suspected) fetal abnormality and damage, not applicable or unspecified: Secondary | ICD-10-CM | POA: Diagnosis present

## 2020-03-18 LAB — COMPREHENSIVE METABOLIC PANEL
ALT: 14 U/L (ref 0–44)
AST: 36 U/L (ref 15–41)
Albumin: 2.8 g/dL — ABNORMAL LOW (ref 3.5–5.0)
Alkaline Phosphatase: 60 U/L (ref 38–126)
Anion gap: 9 (ref 5–15)
BUN: 6 mg/dL (ref 6–20)
CO2: 20 mmol/L — ABNORMAL LOW (ref 22–32)
Calcium: 9.1 mg/dL (ref 8.9–10.3)
Chloride: 107 mmol/L (ref 98–111)
Creatinine, Ser: 0.55 mg/dL (ref 0.44–1.00)
GFR, Estimated: >60 mL/min (ref >60)
Glucose, Bld: 94 mg/dL (ref 70–99)
Potassium: 4.6 mmol/L (ref 3.5–5.1)
Sodium: 136 mmol/L (ref 135–145)
Total Bilirubin: 1.1 mg/dL (ref 0.3–1.2)
Total Protein: 6.3 g/dL — ABNORMAL LOW (ref 6.5–8.1)

## 2020-03-18 LAB — CBC
HCT: 32.9 % — ABNORMAL LOW (ref 36.0–46.0)
Hemoglobin: 9.9 g/dL — ABNORMAL LOW (ref 12.0–15.0)
MCH: 24.1 pg — ABNORMAL LOW (ref 26.0–34.0)
MCHC: 30.1 g/dL (ref 30.0–36.0)
MCV: 80.2 fL (ref 80.0–100.0)
Platelets: 284 10*3/uL (ref 150–400)
RBC: 4.1 MIL/uL (ref 3.87–5.11)
RDW: 15.8 % — ABNORMAL HIGH (ref 11.5–15.5)
WBC: 5.4 10*3/uL (ref 4.0–10.5)
nRBC: 0 % (ref 0.0–0.2)

## 2020-03-18 LAB — RESPIRATORY PANEL BY RT PCR (FLU A&B, COVID)
Influenza A by PCR: NEGATIVE
Influenza B by PCR: NEGATIVE
SARS Coronavirus 2 by RT PCR: NEGATIVE

## 2020-03-18 LAB — TYPE AND SCREEN
ABO/RH(D): O POS
Antibody Screen: NEGATIVE

## 2020-03-18 MED ORDER — LACTATED RINGERS IV SOLN
500.0000 mL | INTRAVENOUS | Status: DC | PRN
Start: 2020-03-18 — End: 2020-03-19

## 2020-03-18 MED ORDER — LIDOCAINE HCL (PF) 1 % IJ SOLN: 30.0000 mL | INTRAMUSCULAR | Status: DC | PRN

## 2020-03-18 MED ORDER — OXYTOCIN BOLUS FROM INFUSION
333.0000 mL | Freq: Once | INTRAVENOUS | Status: AC
  Administered 2020-03-19: 333 mL via INTRAVENOUS

## 2020-03-18 MED ORDER — HYDROXYZINE HCL 50 MG PO TABS: 50.0000 mg | ORAL_TABLET | Freq: Four times a day (QID) | ORAL | Status: DC | PRN

## 2020-03-18 MED ORDER — OXYTOCIN-SODIUM CHLORIDE 30-0.9 UT/500ML-% IV SOLN
2.5000 [IU]/h | INTRAVENOUS | Status: DC
  Filled 2020-03-18: qty 500

## 2020-03-18 MED ORDER — ACETAMINOPHEN 325 MG PO TABS: 650.0000 mg | ORAL_TABLET | ORAL | Status: DC | PRN

## 2020-03-18 MED ORDER — LACTATED RINGERS IV SOLN: INTRAVENOUS | Status: DC

## 2020-03-18 MED ORDER — LACTATED RINGERS IV SOLN: 500.0000 mL | INTRAVENOUS | Status: DC | PRN

## 2020-03-18 MED ORDER — OXYCODONE-ACETAMINOPHEN 5-325 MG PO TABS: 2.0000 | ORAL_TABLET | ORAL | Status: DC | PRN

## 2020-03-18 MED ORDER — OXYTOCIN BOLUS FROM INFUSION: 333.0000 mL | Freq: Once | INTRAVENOUS | Status: DC

## 2020-03-18 MED ORDER — MISOPROSTOL 200 MCG PO TABS
400.0000 ug | ORAL_TABLET | ORAL | Status: AC
Start: 2020-03-18 — End: 2020-03-19
  Administered 2020-03-18 – 2020-03-19 (×3): 400 ug via VAGINAL
  Filled 2020-03-18 (×3): qty 2

## 2020-03-18 MED ORDER — FENTANYL CITRATE (PF) 100 MCG/2ML IJ SOLN
50.0000 ug | INTRAMUSCULAR | Status: DC | PRN
  Administered 2020-03-19 (×2): 100 ug via INTRAVENOUS
  Filled 2020-03-18 (×2): qty 2

## 2020-03-18 MED ORDER — ONDANSETRON HCL 4 MG/2ML IJ SOLN: 4.0000 mg | Freq: Four times a day (QID) | INTRAMUSCULAR | Status: DC | PRN

## 2020-03-18 MED ORDER — OXYCODONE-ACETAMINOPHEN 5-325 MG PO TABS: 1.0000 | ORAL_TABLET | ORAL | Status: DC | PRN

## 2020-03-18 MED ORDER — SOD CITRATE-CITRIC ACID 500-334 MG/5ML PO SOLN: 30.0000 mL | ORAL | Status: DC | PRN

## 2020-03-18 MED ORDER — MISOPROSTOL 200 MCG PO TABS: 200.0000 ug | ORAL_TABLET | ORAL | Status: DC | PRN

## 2020-03-18 NOTE — H&P (Signed)
OBSTETRIC ADMISSION HISTORY AND PHYSICAL  Erika Boyer is a 24 y.o. female G2P0010 with IUP at [redacted]w[redacted]d presenting for IOL 2/2 IUFD.  She declines birth control at this time.  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 Anemia   . Anxiety   . Arthritis    "right knee" (10/11/2016)  . 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 Cervical high risk HPV (human papillomavirus) test positive 03/20/2017   Pap q year  . Chronic bronchitis (HCC)   . Daily headache   . Depression   . Graves disease 2012   hx; "was on RX; it disappeared" (10/11/2016)  . History of blood transfusion    "related to the anemia" (10/11/2016)  . Hypertension   . Lupus (HCC)    "not sure what kind" (10/11/2016)  . Microcytic anemia 10/03/2016  . Pneumonia 10/03/2016    Past Surgical History: Past Surgical History:  Procedure Laterality Date  . PERICARDIOCENTESIS N/A 10/04/2016   Procedure: Pericardiocentesis;  Surgeon: 10/06/2016, MD;  Location: [facility] INVASIVE CV LAB;  Service: Cardiovascular;  Laterality: N/A;  . REDUCTION MAMMAPLASTY Bilateral 2012  . TONSILLECTOMY      Obstetrical History: OB History    Gravida  2   Para  0   Term  0   Preterm  0   AB  1   Living  0     SAB  0   TAB  1   Ectopic  0   Multiple  0   Live Births  0           Social History Social History   Socioeconomic History  . Marital status: Single    Spouse  name: Not on file  . Number of children: 0  . Years of education: 75  . Highest education level: Some college, no degree  Occupational History  . Not on file  Tobacco Use  . Smoking status: Never Smoker  . Smokeless tobacco: Never Used  Vaping Use  . Vaping Use: Never used  Substance and Sexual Activity  . Alcohol use: No  . Drug use: No  . Sexual activity: Yes  Other Topics Concern  . Not on file  Social History Narrative   Lives at home w/ her mom   Right-handed   Caffeine: occasional coffee   Social Determinants of Health   Financial Resource Strain:   . Difficulty of Paying Living Expenses: Not on file  Food Insecurity: No Food Insecurity  . Worried About 18 in the Last Year: Never true  . Ran Out of Food in the Last Year: Never true  Transportation Needs: No Transportation Needs  . Lack of Transportation (Medical): No  . Lack of Transportation (Non-Medical): No  Physical Activity:   . Days of Exercise per Week: Not on file  . Minutes of Exercise per Session: Not on file  Stress:   . Feeling of Stress : Not on file  Social Connections:   . Frequency of Communication with Friends and Family:  Not on file  . Frequency of Social Gatherings with Friends and Family: Not on file  . Attends Religious Services: Not on file  . Active Member of Clubs or Organizations: Not on file  . Attends Banker Meetings: Not on file  . Marital Status: Not on file    Family History: Family History  Problem Relation Age of Onset  . Thalassemia Mother   . Arthritis Mother   . Breast cancer Mother        Mastectomy in early 74's.    . High blood pressure Father   . Mental illness Father        question bipolar disorder per wife  . Sickle cell trait Maternal Aunt   . Breast cancer Maternal Aunt        4 maternal aunts w/breast cancer  . Breast cancer Maternal Grandmother        Bone  . Multiple sclerosis Cousin   . Diabetes Paternal Grandfather      Allergies: Allergies  Allergen Reactions  . Shellfish Allergy Anaphylaxis  . Morphine And Related Itching  . Mushroom Extract Complex Other (See Comments)    Mother is allergic, so patient avoids these  . Tapazole [Methimazole] Swelling and Rash    WELTS (also) Mother suspects this MAY be an allergy, as the onset of symptoms coincided with this being started    Medications Prior to Admission  Medication Sig Dispense Refill Last Dose  . acetaminophen (TYLENOL) 500 MG tablet Take 1 tablet (500 mg total) by mouth every 6 (six) hours as needed. 30 tablet 0   . albuterol (PROAIR HFA) 108 (90 Base) MCG/ACT inhaler Inhale 2 puffs into the lungs every 4 (four) hours as needed for wheezing or shortness of breath.      Marland Kitchen aspirin EC 81 MG tablet Take 1 tablet (81 mg total) by mouth daily. Take after 12 weeks for prevention of preeclampsia later in pregnancy 300 tablet 2   . EPINEPHrine 0.3 mg/0.3 mL IJ SOAJ injection USE AS DIRECTED AND THEN CALL 911     . ferrous sulfate 325 (65 FE) MG tablet Take 1 tablet (325 mg total) by mouth daily with breakfast. (Patient not taking: Reported on 02/16/2020) 30 tablet 3   . hydroxychloroquine (PLAQUENIL) 200 MG tablet TK 2 TS PO QD WF OR MILK  2   . pantoprazole (PROTONIX) 20 MG tablet Take 1 tablet (20 mg total) by mouth daily. 30 tablet 3   . predniSONE (DELTASONE) 1 MG tablet Take 1 tablet (1 mg total) by mouth daily with breakfast. 90 tablet 0   . prenatal vitamin w/FE, FA (PRENATAL 1 + 1) 27-1 MG TABS tablet Take 1 tablet by mouth daily at 12 noon. 30 tablet 12      Review of Systems   All systems reviewed and negative except as stated in HPI  There were no vitals taken for this visit. General appearance: alert, cooperative, appears stated age, no distress and slowed mentation Lungs: normal work of breathing, no respiratory distress Heart: regular rate  Abdomen: soft, non-tender Presentation: breech Fetal monitoring - n/a, IUFD Uterine  activity None    Prenatal labs: ABO, Rh: --/--/PENDING (10/29 2042) Antibody: PENDING (10/29 2042) Rubella: 19.60 (07/07 1158) RPR: Non Reactive (07/07 1158)  HBsAg: Negative (07/07 1158)  HIV: Non Reactive (07/07 1158)    Prenatal Transfer Tool  Maternal Diabetes: No Genetic Screening: Abnormal:  Results: Elevated risk of Trisomy 21 Maternal Ultrasounds/Referrals: Absent nasal bone Fetal  Ultrasounds or other Referrals:  Referred to Materal Fetal Medicine  Maternal Substance Abuse:  No Significant Maternal Medications: No  Significant Maternal Lab Results: None  Results for orders placed or performed during the hospital encounter of 03/18/20 (from the past 24 hour(s))  Type and screen [facility]   Collection Time: 03/18/20  8:42 PM  Result Value Ref Range   ABO/RH(D) PENDING    Antibody Screen PENDING    Sample Expiration      03/21/2020,2359 Performed at [facility], [address] [place], [place] [address]     Patient Active Problem List   Diagnosis Date Noted  . Supervision of high-risk pregnancy 03/18/2020  . BMI 50.0-59.9, adult (HCC) 03/08/2020  . Fetal cardiac anomaly affecting pregnancy, antepartum 02/11/2020  . Pericardial effusion with cardiac tamponade 01/21/2020  . Nutritional counseling 01/21/2020  . Abnormal screening blood test for Down syndrome in first trimester 12/23/2019  . Supervision of high risk pregnancy, antepartum 11/16/2019  . Hypertension   . S/P pericardiocentesis 07/29/2019  . Headache 07/02/2018  . Anxiety 06/18/2017  . 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 SLE (systemic lupus erythematosus) (HCC) 10/30/2016  . Cognitive disorder   . Debility 10/12/2016  . Cerebral atrophy (HCC)   . MR (mental retardation)   . Drug-induced lupus erythematosus   . Tachycardia   . Hypokalemia   . Hyperthyroidism 10/03/2016  . Morbid obesity with BMI of 40.0-44.9, adult (HCC) 10/03/2016  . 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  Cardiomegaly 10/03/2016  . Pleural effusion, left   . Graves disease 08/23/2016  . 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 Low vitamin D level 08/23/2015  . History of chlamydia 02/25/2012  . Unspecified mood (affective) disorder (HCC) 07/09/2011  . Neuropathy 04/10/2011    Assessment/Plan:  Evalyse Stroope is a 24 y.o. G2P0010 at [redacted]w[redacted]d here for IOL due to IUFD.   #Labor: Will begin with high-dose vaginal cytotec, plan for 400 mg every 4 hours.  #Pain: IV fentanyl    Fayette Pho, MD  03/18/2020, 9:04 PM

## 2020-03-19 ENCOUNTER — Inpatient Hospital Stay (HOSPITAL_COMMUNITY): Payer: Medicare Other | Admitting: Anesthesiology

## 2020-03-19 ENCOUNTER — Encounter (HOSPITAL_COMMUNITY): Payer: Self-pay | Admitting: Obstetrics and Gynecology

## 2020-03-19 ENCOUNTER — Encounter (HOSPITAL_COMMUNITY): Payer: Self-pay | Admitting: Anesthesiology

## 2020-03-19 DIAGNOSIS — O364XX0 Maternal care for intrauterine death, not applicable or unspecified: Secondary | ICD-10-CM | POA: Diagnosis present

## 2020-03-19 DIAGNOSIS — O351XX0 Maternal care for (suspected) chromosomal abnormality in fetus, not applicable or unspecified: Secondary | ICD-10-CM | POA: Diagnosis present

## 2020-03-19 DIAGNOSIS — O3513X0 Maternal care for (suspected) chromosomal abnormality in fetus, trisomy 21, not applicable or unspecified: Secondary | ICD-10-CM | POA: Diagnosis present

## 2020-03-19 DIAGNOSIS — Z3A26 26 weeks gestation of pregnancy: Secondary | ICD-10-CM

## 2020-03-19 LAB — SYPHILIS: RPR W/REFLEX TO RPR TITER AND TREPONEMAL ANTIBODIES, TRADITIONAL SCREENING AND DIAGNOSIS ALGORITHM: RPR Ser Ql: NONREACTIVE

## 2020-03-19 MED ORDER — SIMETHICONE 80 MG PO CHEW: 80.0000 mg | CHEWABLE_TABLET | ORAL | Status: DC | PRN

## 2020-03-19 MED ORDER — PREDNISONE 1 MG PO TABS
1.0000 mg | ORAL_TABLET | Freq: Every day | ORAL | Status: DC
  Administered 2020-03-20: 1 mg via ORAL
  Filled 2020-03-19: qty 1

## 2020-03-19 MED ORDER — DIBUCAINE (PERIANAL) 1 % EX OINT: 1.0000 "application " | TOPICAL_OINTMENT | CUTANEOUS | Status: DC | PRN

## 2020-03-19 MED ORDER — BENZOCAINE-MENTHOL 20-0.5 % EX AERO: 1.0000 "application " | INHALATION_SPRAY | CUTANEOUS | Status: DC | PRN

## 2020-03-19 MED ORDER — PHENYLEPHRINE 40 MCG/ML (10ML) SYRINGE FOR IV PUSH (FOR BLOOD PRESSURE SUPPORT)
80.0000 ug | PREFILLED_SYRINGE | INTRAVENOUS | Status: DC | PRN
  Filled 2020-03-19: qty 10

## 2020-03-19 MED ORDER — PRENATAL MULTIVITAMIN CH: 1.0000 | ORAL_TABLET | Freq: Every day | ORAL | Status: DC

## 2020-03-19 MED ORDER — DIPHENHYDRAMINE HCL 25 MG PO CAPS: 25.0000 mg | ORAL_CAPSULE | Freq: Four times a day (QID) | ORAL | Status: DC | PRN

## 2020-03-19 MED ORDER — TETANUS-DIPHTH-ACELL PERTUSSIS 5-2.5-18.5 LF-MCG/0.5 IM SUSY: 0.5000 mL | PREFILLED_SYRINGE | Freq: Once | INTRAMUSCULAR | Status: DC

## 2020-03-19 MED ORDER — FENTANYL-BUPIVACAINE-NACL 0.5-0.125-0.9 MG/250ML-% EP SOLN: 12.0000 mL/h | EPIDURAL | Status: DC | PRN

## 2020-03-19 MED ORDER — PRENATAL MULTIVITAMIN CH
1.0000 | ORAL_TABLET | Freq: Every day | ORAL | Status: DC
  Administered 2020-03-19 – 2020-03-20 (×2): 1 via ORAL
  Filled 2020-03-19 (×2): qty 1

## 2020-03-19 MED ORDER — WITCH HAZEL-GLYCERIN EX PADS: 1.0000 "application " | MEDICATED_PAD | CUTANEOUS | Status: DC | PRN

## 2020-03-19 MED ORDER — LIDOCAINE HCL (PF) 1 % IJ SOLN
INTRAMUSCULAR | Status: DC | PRN
  Administered 2020-03-19: 6 mL via EPIDURAL

## 2020-03-19 MED ORDER — COCONUT OIL OIL: 1.0000 "application " | TOPICAL_OIL | Status: DC | PRN

## 2020-03-19 MED ORDER — IBUPROFEN 600 MG PO TABS: 600.0000 mg | ORAL_TABLET | Freq: Four times a day (QID) | ORAL | Status: DC

## 2020-03-19 MED ORDER — ONDANSETRON HCL 4 MG/2ML IJ SOLN: 4.0000 mg | INTRAMUSCULAR | Status: DC | PRN

## 2020-03-19 MED ORDER — SODIUM CHLORIDE (PF) 0.9 % IJ SOLN
INTRAMUSCULAR | Status: DC | PRN
Start: 2020-03-19 — End: 2020-03-19
  Administered 2020-03-19: 12 mL/h via EPIDURAL

## 2020-03-19 MED ORDER — EPHEDRINE 5 MG/ML INJ: 10.0000 mg | INTRAVENOUS | Status: DC | PRN

## 2020-03-19 MED ORDER — FENTANYL-BUPIVACAINE-NACL 0.5-0.125-0.9 MG/250ML-% EP SOLN
12.0000 mL/h | EPIDURAL | Status: DC | PRN
  Filled 2020-03-19: qty 250

## 2020-03-19 MED ORDER — ACETAMINOPHEN 325 MG PO TABS: 650.0000 mg | ORAL_TABLET | ORAL | Status: DC | PRN

## 2020-03-19 MED ORDER — DIPHENHYDRAMINE HCL 50 MG/ML IJ SOLN: 12.5000 mg | INTRAMUSCULAR | Status: DC | PRN

## 2020-03-19 MED ORDER — SENNOSIDES-DOCUSATE SODIUM 8.6-50 MG PO TABS: 2.0000 | ORAL_TABLET | ORAL | Status: DC

## 2020-03-19 MED ORDER — SIMETHICONE 80 MG PO CHEW
80.0000 mg | CHEWABLE_TABLET | ORAL | Status: DC | PRN
  Administered 2020-03-19: 80 mg via ORAL
  Filled 2020-03-19: qty 1

## 2020-03-19 MED ORDER — MEASLES, MUMPS & RUBELLA VAC IJ SOLR: 0.5000 mL | Freq: Once | INTRAMUSCULAR | Status: DC

## 2020-03-19 MED ORDER — IBUPROFEN 600 MG PO TABS
600.0000 mg | ORAL_TABLET | Freq: Four times a day (QID) | ORAL | Status: DC
  Administered 2020-03-19 – 2020-03-20 (×4): 600 mg via ORAL
  Filled 2020-03-19 (×5): qty 1

## 2020-03-19 MED ORDER — HYDROXYCHLOROQUINE SULFATE 200 MG PO TABS
200.0000 mg | ORAL_TABLET | Freq: Every day | ORAL | Status: DC
  Administered 2020-03-19 – 2020-03-20 (×2): 200 mg via ORAL
  Filled 2020-03-19 (×2): qty 1

## 2020-03-19 MED ORDER — ONDANSETRON HCL 4 MG PO TABS: 4.0000 mg | ORAL_TABLET | ORAL | Status: DC | PRN

## 2020-03-19 MED ORDER — PHENYLEPHRINE 40 MCG/ML (10ML) SYRINGE FOR IV PUSH (FOR BLOOD PRESSURE SUPPORT): 80.0000 ug | PREFILLED_SYRINGE | INTRAVENOUS | Status: DC | PRN

## 2020-03-19 MED ORDER — ENOXAPARIN SODIUM 80 MG/0.8ML ~~LOC~~ SOLN
75.0000 mg | SUBCUTANEOUS | Status: DC
  Administered 2020-03-19: 75 mg via SUBCUTANEOUS
  Filled 2020-03-19: qty 0.8

## 2020-03-19 MED ORDER — LACTATED RINGERS IV SOLN: 500.0000 mL | Freq: Once | INTRAVENOUS | Status: DC

## 2020-03-19 MED ORDER — ENOXAPARIN SODIUM 40 MG/0.4ML ~~LOC~~ SOLN: 40.0000 mg | SUBCUTANEOUS | Status: DC

## 2020-03-19 MED ORDER — SENNOSIDES-DOCUSATE SODIUM 8.6-50 MG PO TABS
2.0000 | ORAL_TABLET | ORAL | Status: DC
  Administered 2020-03-19: 2 via ORAL
  Filled 2020-03-19: qty 2

## 2020-03-19 NOTE — Progress Notes (Signed)
Attempted to get pt to void using bedside commode and bedpan, pt requesting to just use BR; Due to concerns about pt's unsteady gait; used steady to transfer to BR, pt able to void.Instructed to not get up out of bed without assistance due to pain meds; pt verbalized understanding.

## 2020-03-19 NOTE — Anesthesia Preprocedure Evaluation (Signed)
Anesthesia Evaluation  Patient identified by MRN, date of birth, ID band Patient awake    Reviewed: Allergy & Precautions, H&P , NPO status , Patient's Chart, lab work & pertinent test results, reviewed documented beta blocker date and time   Airway Mallampati: II  TM Distance: >3 FB Neck ROM: full    Dental no notable dental hx. (+) Teeth Intact, Dental Advisory Given   Pulmonary neg pulmonary ROS,    Pulmonary exam normal breath sounds clear to auscultation       Cardiovascular hypertension, Pt. on medications Normal cardiovascular exam Rhythm:regular Rate:Normal  ECHO 5/18  EF 55%, valves nl   Neuro/Psych  Headaches, PSYCHIATRIC DISORDERS Anxiety Depression negative neurological ROS  negative psych ROS   GI/Hepatic negative GI ROS, Neg liver ROS,   Endo/Other  Hyperthyroidism   Renal/GU negative Renal ROS  negative genitourinary   Musculoskeletal  (+) Arthritis ,   Abdominal (+) + obese,   Peds  Hematology  (+) Blood dyscrasia, anemia ,   Anesthesia Other Findings   Reproductive/Obstetrics (+) Pregnancy                             Anesthesia Physical Anesthesia Plan  ASA: III  Anesthesia Plan: Epidural   Post-op Pain Management:    Induction:   PONV Risk Score and Plan:   Airway Management Planned: Natural Airway  Additional Equipment:   Intra-op Plan:   Post-operative Plan:   Informed Consent: I have reviewed the patients History and Physical, chart, labs and discussed the procedure including the risks, benefits and alternatives for the proposed anesthesia with the patient or authorized representative who has indicated his/her understanding and acceptance.     Dental Advisory Given  Plan Discussed with:   Anesthesia Plan Comments: (Labs checked- platelets confirmed with RN in room. Fetal heart tracing, per RN, reported to be stable enough for sitting  procedure. Discussed epidural, and patient consents to the procedure:  included risk of possible headache,backache, failed block, allergic reaction, and nerve injury. This patient was asked if she had any questions or concerns before the procedure started.)        Anesthesia Quick Evaluation

## 2020-03-19 NOTE — Progress Notes (Signed)
Subjective: Went to assess patient about 45 minutes after delivery. Family practice resident had called to say the patient had delivered, there was no bleeding , but placenta was still in place.  Upon my arrival, the majority of the placenta was external to the vagina.  Pt was asked to gently push and the placenta was fully delivered intact.  One small clot was delivered afterwards.  There was no further vaginal bleeding and pt was receiving routine postpartum pitocin.  Per MFM recommendation, samples were taken from the umbilical cord and gestational sac [facility].  Paperwork was filled out as well. Objective: Vital signs in last 24 hours: Temp:  [98.9 F (37.2 C)-99 F (37.2 C)] 99 F (37.2 C) (10/30 0313) Pulse Rate:  [101-121] 121 (10/30 0431) Resp:  [19] 19 (10/30 0313) BP: (110-138)/(56-92) 110/56 (10/30 0431) SpO2:  [92 %-100 %] 100 % (10/30 0410) Weight:  [146.1 kg] 146.1 kg (10/29 2136) Weight change:   Intake/Output from previous day: 10/29 0701 - 10/30 0700 In: 1707.8 [I.V.:1707.8] Out: 450 [Emesis/NG output:200; Blood:250] Intake/Output this shift: Total I/O In: 1707.8 [I.V.:1707.8] Out: 450 [Emesis/NG output:200; Blood:250]  perineum intact  Lab Results: Recent Labs    03/18/20 2106  WBC 5.4  HGB 9.9*  HCT 32.9*  PLT 284   BMET:  Recent Labs    03/18/20 2106  NA 136  K 4.6  CL 107  CO2 20*  GLUCOSE 94  BUN 6  CREATININE 0.55  CALCIUM 9.1   No results for input(s): PTH in the last 72 hours. Iron Studies: No results for input(s): IRON, TIBC, TRANSFERRIN, FERRITIN in the last 72 hours. Studies/Results: US MFM OB FOLLOW UP  Result Date: 03/18/2020 ----------------------------------------------------------------------  OBSTETRICS REPORT                       (Signed Final 03/18/2020 04:46 pm) ---------------------------------------------------------------------- Patient Info  ID #:       [id number]                          D.O.B.:  Oct 25, 1995 (24  yrs)  Name:       Erika Boyer                 Visit Date: 03/18/2020 03:12 pm ---------------------------------------------------------------------- Performed By  Attending:        Noralee Spaceavi Shankar MD        Ref. Address:     [address]                                                             [address]                                                             [place], [place]  [address]  Performed By:     Tommie Raymond BS,       [facility], [facility]                                [facility]  Referred By:      Tereso Newcomer MD ---------------------------------------------------------------------- Orders  #  Description                           Code        Ordered By  1  [place] MFM OB FOLLOW UP                   63016.01    Noralee Space ----------------------------------------------------------------------  #  Order #                     Accession #                Episode #  1  [id number]                   [id number]                 [id number] ---------------------------------------------------------------------- Indications  [redacted] weeks gestation of pregnancy                Z3A.26  Abnormal finding on antenatal screening        O28.9  (NIPS - high risk T21)  Obesity complicating pregnancy, second         O99.212  trimester  Hyperthyroid                                   O99.280 E05.90  Hypertension - Chronic/Pre-existing            O10.019  Systemic lupus complicating pregnancy,         O26.892, M32.9  second trimester  Encounter for other antenatal screening        Z36.2  follow-up ---------------------------------------------------------------------- Vital Signs                                                 Height:  5'5"  ---------------------------------------------------------------------- Fetal Evaluation  Num Of Fetuses:         1  Fetal Heart Rate(bpm):  0  Cardiac Activity:       Not visualized  Presentation:           Breech  Placenta:               Posterior  P. Cord Insertion:      Previously Visualized  Amniotic Fluid  AFI FV:      Within normal limits                              Largest Pocket(cm)                              6.5 ---------------------------------------------------------------------- Biometry  BPD:      46.3  mm     G. Age:  20w 0d        < 1  %    CI:        66.38   %    70 - 86                                                          FL/HC:      20.2   %    18.6 - 20.4  HC:      182.2  mm     G. Age:  20w 4d        < 1  %    HC/AC:      1.21        1.05 - 1.21  AC:      150.1  mm     G. Age:  20w 2d        < 1  %    FL/BPD:     79.5   %    71 - 87  FL:       36.8  mm     G. Age:  21w 5d        < 1  %    FL/AC:      24.5   %    20 - 24  NFT:      11.7  mm  Est. FW:     382  gm    0 lb 13 oz     < 1  % ---------------------------------------------------------------------- OB History  Gravidity:    2         Term:   0        Prem:   0        SAB:   0  TOP:          1       Ectopic:  0        Living: 0 ---------------------------------------------------------------------- Gestational Age  LMP:           28w 1d        Date:  09/03/19                 EDD:   06/09/20  U/S Today:     20w 5d  EDD:   07/31/20  Best:          Altamese Cabal 4d     Det. By:  U/S C R L  (12/08/19)    EDD:   06/20/20 ---------------------------------------------------------------------- Cervix Uterus Adnexa  Cervix  Length:           3.21  cm.  Not visualized (advanced GA >24wks)  Uterus  No abnormality visualized.  Right Ovary  Within normal limits. No adnexal mass visualized.  Left Ovary  Within normal limits. No adnexal mass visualized.  Cul De Sac  No free fluid seen.  Adnexa  No abnormality visualized.  ---------------------------------------------------------------------- Impression  Patient returns for fetal growth assessment.  On cell free fetal  DNA screening, increased risk for Down syndrome was seen.  Patient opted not to have amniocentesis.  On first trimester  screening increased nuchal translucency (3 mm) was seen.  On fetal echocardiography [facility]  complete atrioventricular septal defect was suspected.  Poor  resolution of images because of maternal obesity.  On today's ultrasound, amniotic fluid is normal.  Breech  presentation.  Scalp edema and generalized skin edema  were noted.  Unfortunately, no fetal heart activity was seen.  Findings suggest maceration of the fetus.  Fetal biometry  lacks gestational age by almost 6 weeks.  The estimated fetal  weight is 382 g.  Impression: Intrauterine fetal demise.  I counseled the patient on the findings and recommended  induction of labor. Her mother was present and the patient  wanted to undergo induction today.  Discussed with Dr. Adrian Blackwater, Ob Attending. Patient to go over  to L&D for further management.  I briefly discussed autopsy and ANORA.  Blood type: O positive. ---------------------------------------------------------------------- Recommendations  Kizzie Fantasia for fetal karyotype/microarray. ----------------------------------------------------------------------                  Noralee Space, MD Electronically Signed Final Report   03/18/2020 04:46 pm ----------------------------------------------------------------------   I have reviewed the patient's current medications.  Assessment/Plan: PPD 0 Begin routine postpartum care. Start prophylactic lovenox 12 hours post delivery due to pt hx of lupus.    LOS: 1 day   Warden Fillers 03/19/2020,6:27 AM

## 2020-03-19 NOTE — Anesthesia Procedure Notes (Signed)
Epidural Patient location during procedure: OB Start time: 03/19/2020 4:13 AM End time: 03/19/2020 4:22 AM  Staffing Anesthesiologist: Bethena Midget, MD  Preanesthetic Checklist Completed: patient identified, IV checked, site marked, risks and benefits discussed, surgical consent, monitors and equipment checked, pre-op evaluation and timeout performed  Epidural Patient position: sitting Prep: DuraPrep and site prepped and draped Patient monitoring: continuous pulse ox and blood pressure Approach: midline Location: L2-L3 Injection technique: LOR air  Needle:  Needle type: Tuohy  Needle gauge: 17 G Needle length: 9 cm and 9 Needle insertion depth: 9 cm Catheter type: closed end flexible Catheter size: 19 Gauge Catheter at skin depth: 15 cm Test dose: negative  Assessment Events: blood not aspirated, injection not painful, no injection resistance, no paresthesia and negative IV test

## 2020-03-19 NOTE — Anesthesia Postprocedure Evaluation (Signed)
Anesthesia Post Note  Patient: Erika Boyer  Procedure(s) Performed: AN AD HOC LABOR EPIDURAL     Patient location during evaluation: Mother Baby Anesthesia Type: Epidural Level of consciousness: awake Pain management: satisfactory to patient Vital Signs Assessment: post-procedure vital signs reviewed and stable Respiratory status: spontaneous breathing Cardiovascular status: stable Anesthetic complications: no   No complications documented.  Last Vitals:  Vitals:   03/19/20 0615 03/19/20 0630  BP: 125/68 (!) 110/46  Pulse: (!) 118 (!) 116  Resp: 17   Temp: 37.2 C   SpO2:      Last Pain:  Vitals:   03/19/20 0645  TempSrc:   PainSc: 0-No pain   Pain Goal: Patients Stated Pain Goal: 0 (03/19/20 0108)   Report via night RN                 Cephus Shelling

## 2020-03-19 NOTE — Progress Notes (Signed)
Labor Progress Note Erika Boyer is a 24 y.o. G2P0010 at [redacted]w[redacted]d with IUP presented for IOL for IUFD.   S: Doing well, appears comfortable. Is experiencing some abdominal cramps "like I'm on my period but times two".   O:  BP 116/84   Pulse (!) 108   Temp 98.9 F (37.2 C) (Oral)   Resp 19   Ht 5\' 5"  (1.651 m)   Wt (!) 146.1 kg   LMP  (LMP Unknown)   BMI 53.58 kg/m  EFM: n/a  CVE: Dilation: 2 Effacement (%): 30 Cervical Position: Anterior Exam by:: Jburris RN   A&P: 24 y.o. G2P0010 [redacted]w[redacted]d presented for IOL for IUFD.   #Labor: Progressing well. Cytotec x2, last given 0011.  #Pain: IV pain prn at this time   [redacted]w[redacted]d, MD 12:32 AM

## 2020-03-20 LAB — CBC
HCT: 30.3 % — ABNORMAL LOW (ref 36.0–46.0)
Hemoglobin: 9.2 g/dL — ABNORMAL LOW (ref 12.0–15.0)
MCH: 24 pg — ABNORMAL LOW (ref 26.0–34.0)
MCHC: 30.4 g/dL (ref 30.0–36.0)
MCV: 79.1 fL — ABNORMAL LOW (ref 80.0–100.0)
Platelets: 226 10*3/uL (ref 150–400)
RBC: 3.83 MIL/uL — ABNORMAL LOW (ref 3.87–5.11)
RDW: 15.8 % — ABNORMAL HIGH (ref 11.5–15.5)
WBC: 4.5 10*3/uL (ref 4.0–10.5)
nRBC: 0 % (ref 0.0–0.2)

## 2020-03-20 MED ORDER — FERROUS SULFATE 325 (65 FE) MG PO TABS: 325.0000 mg | ORAL_TABLET | Freq: Every day | ORAL | 0 refills | Status: DC

## 2020-03-20 MED ORDER — IBUPROFEN 600 MG PO TABS
600.0000 mg | ORAL_TABLET | Freq: Four times a day (QID) | ORAL | 0 refills | Status: DC
Start: 2020-03-20 — End: 2021-03-07

## 2020-03-20 MED ORDER — DOCUSATE SODIUM 100 MG PO CAPS: 100.0000 mg | ORAL_CAPSULE | Freq: Two times a day (BID) | ORAL | 2 refills | Status: DC | PRN

## 2020-03-20 NOTE — Discharge Summary (Signed)
Postpartum Discharge Summary      Patient Name: Erika Boyer DOB: June 27, 1995 MRN: [id number]  Date of admission: 03/18/2020 Delivery date:03/19/2020  Delivering provider: Cam Hai D  Date of discharge: 03/20/2020  Admitting diagnosis: Pregnancy at 26/4 with IUFD diagnosis. High risk pregnancy due to fetus with high risk for trisomy 21 on Panorama cell free fetal DNA testing, FGR at <1%, SLE, chronic HTN, BMI 50s Additional problems: Maternal cerebellar degeneration.     Discharge diagnosis: Preterm Pregnancy Delivered                                              Post partum procedures:None Augmentation: Cytotec Complications: None  Hospital course: Patient diagnosed with IUFD on 10/29 at MFM growth ultrasound and sent fo L&D for IOL. She hand an uncomplicated IOL and vaginal delivery with no tears, EBL , 405gm, female.  She also had an uncomplicated postpartum course. She and her mother state she is ambulatory at home on her own and with a walker and that she will sometimes go to the gym. Given this and because she had a vaginal delivery, I did not feel that prophylactic anti-coagulation was indicated in her situation. She was seen by SW and no issues identified.   Magnesium Sulfate received: No BMZ received: No Rhophylac:N/A  Physical exam  Vitals:   03/19/20 2011 03/19/20 2251 03/20/20 0503 03/20/20 0807  BP: 128/66 (!) 112/52 (!) 112/47 (!) 108/54  Pulse: (!) 102 96 99 (!) 106  Resp: (!) 22 18 18 18   Temp: 97.8 F (36.6 C) 98.8 F (37.1 C) 98.4 F (36.9 C) 98.4 F (36.9 C)  TempSrc: Oral Oral Oral Oral  SpO2: 100% 100% 100% 100%  Weight:      Height:       General: alert Lochia: appropriate Uterine Fundus: firm, nttp Incision: N/A   Pending Labs: Anora testing Placental pathology  Labs: RPR negative  Lab Results  Component Value Date   WBC 4.5 03/20/2020   HGB 9.2 (L) 03/20/2020   HCT 30.3 (L) 03/20/2020   MCV 79.1 (L) 03/20/2020   PLT 226  03/20/2020   CMP Latest Ref Rng & Units 03/18/2020  Glucose 70 - 99 mg/dL 94  BUN 6 - 20 mg/dL 6  Creatinine 03/20/2020 - 9.62 mg/dL 2.29  Sodium 7.98 - 921 mmol/L 136  Potassium 3.5 - 5.1 mmol/L 4.6  Chloride 98 - 111 mmol/L 107  CO2 22 - 32 mmol/L 20(L)  Calcium 8.9 - 10.3 mg/dL 9.1  Total Protein 6.5 - 8.1 g/dL 6.3(L)  Total Bilirubin 0.3 - 1.2 mg/dL 1.1  Alkaline Phos 38 - 126 U/L 60  AST 15 - 41 U/L 36  ALT 0 - 44 U/L 14   Edinburgh Score: No flowsheet data found.  After visit meds:  Allergies as of 03/20/2020      Reactions   Shellfish Allergy Anaphylaxis   Morphine And Related Itching   Mushroom Extract Complex Other (See Comments)   Mother is allergic, so patient avoids these   Tapazole [methimazole] Swelling, Rash   WELTS (also) Mother suspects this MAY be an allergy, as the onset of symptoms coincided with this being started      Medication List    STOP taking these medications   EPINEPHrine 0.3 mg/0.3 mL Soaj injection Commonly known as: EPI-PEN     TAKE these medications  acetaminophen 500 MG tablet Commonly known as: TYLENOL Take 1 tablet (500 mg total) by mouth every 6 (six) hours as needed.   aspirin EC 81 MG tablet Take 1 tablet (81 mg total) by mouth daily. Take after 12 weeks for prevention of preeclampsia later in pregnancy   docusate sodium 100 MG capsule Commonly known as: COLACE Take 1 capsule (100 mg total) by mouth 2 (two) times daily as needed.   ferrous sulfate 325 (65 FE) MG tablet Take 1 tablet (325 mg total) by mouth daily with breakfast.   hydroxychloroquine 200 MG tablet Commonly known as: PLAQUENIL TK 2 TS PO QD WF OR MILK   ibuprofen 600 MG tablet Commonly known as: ADVIL Take 1 tablet (600 mg total) by mouth every 6 (six) hours.   pantoprazole 20 MG tablet Commonly known as: Protonix Take 1 tablet (20 mg total) by mouth daily.   predniSONE 1 MG tablet Commonly known as: DELTASONE Take 1 tablet (1 mg total) by mouth daily  with breakfast.   prenatal vitamin w/FE, FA 27-1 MG Tabs tablet Take 1 tablet by mouth daily at 12 noon.   ProAir HFA 108 (90 Base) MCG/ACT inhaler Generic drug: albuterol Inhale 2 puffs into the lungs every 4 (four) hours as needed for wheezing or shortness of breath.        Discharge home in stable condition Infant Disposition:morgue Discharge instruction: per After Visit Summary and Postpartum booklet. Activity: Advance as tolerated. Pelvic rest for 6 weeks.  Diet: routine diet Anticipated Birth Control: Unsure Postpartum Appointment:4 weeks Additional Postpartum F/U: 1wk for mood and BP check Future Appointments: Future Appointments  Date Time Provider Department Center  03/21/2020  1:55 PM Warden Fillers, MD [facility]   Follow up Visit:  Follow-up Information    Center for [facility]. Go in 4 day(s).   Specialty: Obstetrics and Gynecology Contact information: [address] [place] [address] [phone]                  03/20/2020 [place] Bing, MD

## 2020-03-20 NOTE — Lactation Note (Signed)
Lactation Consultation Note  Patient Name: Erika Boyer YHTMB'P Date: 03/20/2020 Reason for consult: Initial assessment  LC visit after fetal demise. Talked to patient about breast milk still coming in to the breasts in a couple of days. Discussed options to anticipate changes associated with lactation such as engorgement and/or breast milk dripping. Reviewed options to cope with lactation and the loss of a baby. Offer alternatives to stop lactation like cabbage leaves, ice and expressing for comfort. Provided handout.     Patient is [facility] participant. Provided WIC information about support after a loss.   Mother is ready to be discharged.   Maternal Data Formula Feeding for Exclusion: No Has patient been taught Hand Expression?: No Does the patient have breastfeeding experience prior to this delivery?: No  Lactation Tools Discussed/Used WIC Program: Yes   Consult Status Consult Status: Complete    Malene Blaydes A Higuera Ancidey 03/20/2020, 11:55 AM

## 2020-03-20 NOTE — Plan of Care (Signed)
  Problem: Education: Goal: Knowledge of disease or condition will improve Outcome: Adequate for Discharge Goal: Knowledge of the prescribed therapeutic regimen will improve Outcome: Adequate for Discharge   Problem: Coping: Goal: Ability to identify and utilize appropriate coping strategies will improve Outcome: Adequate for Discharge Goal: Ability to identify and utilize available support systems will improve Outcome: Adequate for Discharge Goal: Will verbalize feelings Outcome: Adequate for Discharge Goal: Decrease level of anxiety will Outcome: Adequate for Discharge   Problem: Clinical Measurements: Goal: Will show no signs and symptoms of excessive bleeding Outcome: Adequate for Discharge   Problem: Education: Goal: Knowledge of General Education information will improve Description: Including pain rating scale, medication(s)/side effects and non-pharmacologic comfort measures Outcome: Adequate for Discharge   Problem: Health Behavior/Discharge Planning: Goal: Ability to manage health-related needs will improve Outcome: Adequate for Discharge   Problem: Clinical Measurements: Goal: Ability to maintain clinical measurements within normal limits will improve Outcome: Adequate for Discharge Goal: Will remain free from infection Outcome: Adequate for Discharge Goal: Cardiovascular complication will be avoided Outcome: Adequate for Discharge   Problem: Activity: Goal: Risk for activity intolerance will decrease Outcome: Adequate for Discharge   Problem: Nutrition: Goal: Adequate nutrition will be maintained Outcome: Adequate for Discharge   Problem: Coping: Goal: Level of anxiety will decrease Outcome: Adequate for Discharge   Problem: Elimination: Goal: Will not experience complications related to bowel motility Outcome: Adequate for Discharge Goal: Will not experience complications related to urinary retention Outcome: Adequate for Discharge   Problem: Pain  Managment: Goal: General experience of comfort will improve Outcome: Adequate for Discharge   Problem: Safety: Goal: Ability to remain free from injury will improve Outcome: Adequate for Discharge   Problem: Skin Integrity: Goal: Risk for impaired skin integrity will decrease Outcome: Adequate for Discharge

## 2020-03-20 NOTE — Progress Notes (Signed)
CSW received consult for fetal demise.  CSW met with MOB and MGM at bedside to offer condolences, support, and assess needs. MOB was reasonably tearful and expressive of thoughts. CSW offered safe space and encouraged MOB to pursue grief and loss counseling. MOB stated plans to consider it. In addition, CSW recommended journaling thought and feeling. During this visit baby was brought in for MOB to say goodbyes and take picture. As CSW was leaving chaplain entered to off additional support.   CSW provided education regarding the baby blues period vs. perinatal mood disorders, discussed treatment and gave resources for mental health follow up if concerns arise.  CSW recommends self-evaluation during the postpartum time period using the New Mom Checklist from Postpartum Progress and encouraged MOB to contact a medical professional if symptoms are noted at any time.     CSW identifies no further need for intervention and no barriers to discharge at this time.  Raydel Hosick D. Lissa Morales, MSW, [place] Clinical Social Worker [phone]

## 2020-03-20 NOTE — Discharge Instructions (Signed)
Fetal Demise °Fetal demise, also called intrauterine fetal demise (IUFD) or fetal death, is the loss of a baby after 20 weeks of pregnancy. When this occurs, the unborn baby (fetus) does not show any signs of life, such as a heartbeat or breathing. Most women recognize the loss of the baby soon after it happens, due to the lack of fetal movement. Some women experience spontaneous labor shortly after fetal demise resulting in a stillborn birth (stillbirth). °What are the causes? °The cause is often unknown. Sometimes fetal demise may be caused by: °· A problem with the umbilical cord or placenta. °· A birth defect or genetic disorder in the baby. °· A serious infection during pregnancy. °· The baby not growing enough in the womb (poor fetal growth). °· Long-term health problems in the mother, such as diabetes, lupus, kidney disease, thyroid disease, or gallbladder disease. °· High blood pressure (hypertension) or a related disorder such as preeclampsia. °What increases the risk? °You may have a higher risk of fetal demise if you: °· Use tobacco, drugs, or alcohol during pregnancy. °· Are pregnant with two or more babies. °· Are older than age 35. °· Are of African-American descent. °· Are pregnant for the first time. °· Are obese. °What are the signs or symptoms? °When you experience fetal demise, your abdomen will stop getting bigger and you will not feel your baby move. °Fetal demise increases your risk of certain complications, such as: °· Severe bleeding due to problems with blood clotting (disseminated intravascular coagulation). °· Infection in the uterus (intrauterine infection). °· Increased bleeding from the uterus. °How is this diagnosed? °A physical exam may show signs of fetal demise. The diagnosis must be confirmed with an ultrasound to check for: °· The lack of a heartbeat. °· The lack of fetal movement. °How is this treated? °Treatment for fetal demise may include: °· Waiting for your body's natural  process of labor to begin (expectant management). You will be monitored closely during this time for signs of infection or other problems. You may have the option to return home with instructions about what to expect as your body continues to naturally release your baby and placenta. If labor does not start on its own, you will need a labor induction. °· Using medicines to start your labor (labor induction). If labor has not started, you may choose to have your labor induced with medicines and be monitored in the hospital setting. This will cause you to have contractions in order to deliver your baby and placenta. °· Doing surgery to deliver your baby and placenta through an incision in your abdomen and your uterus (cesarean delivery, or C-section). °After delivery: °· Parents are encouraged to see and hold their baby. This may help you with the grieving process. °· You may consider creating keepsakes, such as taking a photo or getting an imprint of your baby's footprint or handprint. °· Request to have any religious or cultural ceremonies that you prefer. Work with your health care team to make arrangements for the handling of your baby's remains. °· Your baby may be examined to determine if problems were present that could happen again in a future pregnancy. °· You may be given antibiotic medicine to treat infection if you lost your baby because of an infection. °· If you have Rh-negative blood, you may be given a injection of Rho(D) immune globulin to help prevent problems with future pregnancies. °Follow these instructions at home: °Medicines °· Take over-the-counter and prescription medicines only as   told by your health care provider. °· If you were prescribed an antibiotic, take it as told by your health care provider. Do not stop taking the antibiotic even if you start to feel better. °General instructions °· Your health care provider may recommend working with a trained grief counselor who can help you work  through your emotions. It may help to work with this counselor before and after delivery. °· It may be helpful to meet with others who have experienced fetal demise. Ask your health care provider about support groups and resources. °· Talk with your health care provider about any activity restrictions, including sexual activity. °· Use a sanitary napkin for bleeding and discharge from your vagina. Do not douche or use tampons until your health care provider says that this is safe. °· Keep all follow-up visits as told by your health care provider. This is important. °· When you are ready, meet with your health care provider to discuss steps to take for a future pregnancy. °Contact a health care provider if you: °· Continue to experience grief, sadness, or lack of motivation for everyday activities, and those feelings do not improve over time. °· Have painful, hard, or reddened breasts. °· Have not had a menstrual period by the 12th week after delivery. °Get help right away if you have: °· Heavy vaginal bleeding that soaks more than 1 regular sanitary pad in an hour. °· Chest pain. °· A fever or chills. °· Shortness of breath. °· Vaginal bleeding that continues for more than 6 weeks after delivery. °· Pain while urinating. °· Blood in your urine. °· Bad-smelling vaginal discharge. °· Abdominal pain. °· Leg pain, swelling, or redness. °· A severe headache. °· Changes in your vision. °· Feelings of sadness that take over your thoughts, or you have thoughts of hurting yourself. °If you ever feel like you may hurt yourself or others, or have thoughts about taking your own life, get help right away. You can go to your nearest emergency department or call: °· Your local emergency services (911 in the [place] °· A suicide crisis helpline, such as the National Suicide Prevention Lifeline at 1-800-273-8255. This is open 24 hours a day. °Summary °· Fetal demise, also called intrauterine fetal demise (IUFD) or fetal death, is the  loss of a baby after 20 weeks of pregnancy. °· You may be at higher risk for fetal demise if you use tobacco, drugs, or alcohol during pregnancy. °· Treatment may include waiting for your body's natural process of labor to begin, using medicine to start labor (labor induction), or doing surgery to deliver your baby (cesarean delivery, or C-section). °· Your health care provider may recommend working with a trained grief counselor who can help you work through your emotions. °This information is not intended to replace advice given to you by your health care provider. Make sure you discuss any questions you have with your health care provider. °Document Revised: 04/19/2017 Document Reviewed: 03/19/2017 °Elsevier Patient Education © 2020 Elsevier Inc. ° °

## 2020-03-21 ENCOUNTER — Encounter: Payer: Medicare Other | Admitting: Obstetrics and Gynecology

## 2020-03-22 LAB — SURGICAL PATHOLOGY

## 2020-03-25 ENCOUNTER — Ambulatory Visit: Payer: Medicare Other | Admitting: Clinical

## 2020-03-25 ENCOUNTER — Encounter: Payer: Self-pay | Admitting: Obstetrics and Gynecology

## 2020-03-25 ENCOUNTER — Other Ambulatory Visit: Payer: Self-pay

## 2020-03-25 ENCOUNTER — Ambulatory Visit (INDEPENDENT_AMBULATORY_CARE_PROVIDER_SITE_OTHER): Payer: Medicare Other | Admitting: Obstetrics and Gynecology

## 2020-03-25 VITALS — BP 139/85 | HR 99 | Wt 322.4 lb

## 2020-03-25 DIAGNOSIS — O99345 Other mental disorders complicating the puerperium: Secondary | ICD-10-CM

## 2020-03-25 DIAGNOSIS — F53 Postpartum depression: Secondary | ICD-10-CM | POA: Insufficient documentation

## 2020-03-25 NOTE — Progress Notes (Signed)
Obstetrics and Gynecology Visit Return Patient Evaluation  Appointment Date: 03/25/2020  Primary Care Provider: Cain Saupe  OBGYN Clinic: [facility] for [facility]  Chief Complaint: follow up 26wk IUFD and delivery   History of Present Illness:  Erika Boyer is a 24 y.o. G2P0110 s/p 10/30 SVD/intact perineum. Preg c/b fetus with FGR, T21 on cffdna, fetal cardiac anomalies. Maternal h/o significant for SLE, CHTN (no meds), cerebellar degeneration, remote h/o maternal pericardial effusion; pt was discharged on PPD#1  Interval History: Since that time, she states that she has no bleeding or pain. She is grieving appropriately   Placental pathology.   Review of Systems:  as noted in the History of Present Illness.  Patient Active Problem List   Diagnosis Date Noted  . Fetal trisomy 21 affecting care of mother, antepartum 03/19/2020  . Fetal demise, greater than 22 weeks, antepartum 03/19/2020  . BMI 50.0-59.9, adult (HCC) 03/08/2020  . Fetal cardiac anomaly affecting pregnancy, antepartum 02/11/2020  . Pericardial effusion with cardiac tamponade 01/21/2020  . Nutritional counseling 01/21/2020  . Abnormal screening blood test for Down syndrome in first trimester 12/23/2019  . Supervision of high risk pregnancy, antepartum 11/16/2019  . Hypertension   . S/P pericardiocentesis 07/29/2019  . Headache 07/02/2018  . Anxiety 06/18/2017  . 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 SLE (systemic lupus erythematosus) (HCC) 10/30/2016  . Cognitive disorder   . Debility 10/12/2016  . Cerebral atrophy (HCC)   . Intellectual disability   . Drug-induced lupus erythematosus   . Tachycardia   . Hypokalemia   . Hyperthyroidism 10/03/2016  . Morbid obesity with BMI of 40.0-44.9, adult (HCC) 10/03/2016  . 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 Cardiomegaly 10/03/2016  . Pleural effusion, left   . Graves disease 08/23/2016  . 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 Low  vitamin D level 08/23/2015  . History of chlamydia 02/25/2012  . Unspecified mood (affective) disorder (HCC) 07/09/2011  . Neuropathy 04/10/2011   Medications:  Baxter Flattery had no medications administered during this visit. Current Outpatient Medications  Medication Sig Dispense Refill  . acetaminophen (TYLENOL) 500 MG tablet Take 1 tablet (500 mg total) by mouth every 6 (six) hours as needed. 30 tablet 0  . albuterol (PROAIR HFA) 108 (90 Base) MCG/ACT inhaler Inhale 2 puffs into the lungs every 4 (four) hours as needed for wheezing or shortness of breath.     . docusate sodium (COLACE) 100 MG capsule Take 1 capsule (100 mg total) by mouth 2 (two) times daily as needed. 30 capsule 2  . ferrous sulfate 325 (65 FE) MG tablet Take 1 tablet (325 mg total) by mouth daily with breakfast. 30 tablet 0  . hydroxychloroquine (PLAQUENIL) 200 MG tablet TK 2 TS PO QD WF OR MILK  2  . ibuprofen (ADVIL) 600 MG tablet Take 1 tablet (600 mg total) by mouth every 6 (six) hours. 30 tablet 0  . predniSONE (DELTASONE) 1 MG tablet Take 1 tablet (1 mg total) by mouth daily with breakfast. 90 tablet 0  . prenatal vitamin w/FE, FA (PRENATAL 1 + 1) 27-1 MG TABS tablet Take 1 tablet by mouth daily at 12 noon. 30 tablet 12  . aspirin EC 81 MG tablet Take 1 tablet (81 mg total) by mouth daily. Take after 12 weeks for prevention of preeclampsia later in pregnancy (Patient not taking: Reported on 03/25/2020) 300 tablet 2  . pantoprazole (PROTONIX) 20 MG tablet Take 1 tablet (20 mg total) by mouth daily. (Patient not  taking: Reported on 03/25/2020) 30 tablet 3   No current facility-administered medications for this visit.    Allergies: is allergic to shellfish allergy, morphine and related, mushroom extract complex, and tapazole [methimazole].  Physical Exam:  BP 139/85   Pulse 99   Wt (!) 322 lb 6.4 oz (146.2 kg)   LMP  (LMP Unknown)   Breastfeeding No   BMI 53.65 kg/m  Body mass index is 53.65 kg/m. General  appearance: Well nourished, well developed female in no acute distress.  Neuro/Psych:  Normal mood and affect.    Assessment: pt stable  Plan:  1. Postpartum depression BH able to see her as add on today to go over strategies to help with coping.   D/w her re: mental health resources and to let us know about any thoughts of SI or come into the ED.    RTC: 2wk for PP visit.   Cornelia Copa MD Attending Center for [employer] Midwife)

## 2020-03-25 NOTE — BH Specialist Note (Signed)
I reviewed patient visit with the [facility] Intern, and I concur with the treatment plan, as documented in the [facility] Intern note.   No charge for this visit due to [facility] Intern seeing patient.  Vesta Mixer, MSW, [place] for [facility]  Intern met with Pt to discuss PHQ-9 and GAD-7. Pt denied SI/HI and stated she accidentally marked the last question on the PHQ-9 as a 1. Pt recently experienced a fetal demise and stated that her anxiety and depressive symptoms have decreased since. Pt stated she is sleeping more and her main concern is her appetite. Pt stated she sometimes does not eat at all. Intern discussed the Pt talking with the Pt's physician for recommendations on what to eat and encouraged the client to try to increase the amount she is eating. Intern provided Behavioral Health Urgent Care contact and address. Intern assessed the client's willingness to seek services if in crisis. Pt confirmed she would reach out if she experiences SI/HI.  Gertha Calkin (Supervisor: Vesta Mixer)

## 2020-03-31 ENCOUNTER — Encounter: Payer: Self-pay | Admitting: *Deleted

## 2020-04-05 DIAGNOSIS — M2550 Pain in unspecified joint: Secondary | ICD-10-CM | POA: Diagnosis not present

## 2020-04-05 DIAGNOSIS — M329 Systemic lupus erythematosus, unspecified: Secondary | ICD-10-CM | POA: Diagnosis not present

## 2020-04-05 DIAGNOSIS — Z6841 Body Mass Index (BMI) 40.0 and over, adult: Secondary | ICD-10-CM | POA: Diagnosis not present

## 2020-04-05 DIAGNOSIS — Z79899 Other long term (current) drug therapy: Secondary | ICD-10-CM | POA: Diagnosis not present

## 2020-04-05 DIAGNOSIS — Z3492 Encounter for supervision of normal pregnancy, unspecified, second trimester: Secondary | ICD-10-CM | POA: Diagnosis not present

## 2020-04-05 DIAGNOSIS — J90 Pleural effusion, not elsewhere classified: Secondary | ICD-10-CM | POA: Diagnosis not present

## 2020-04-05 DIAGNOSIS — I313 Pericardial effusion (noninflammatory): Secondary | ICD-10-CM | POA: Diagnosis not present

## 2020-04-13 ENCOUNTER — Ambulatory Visit (INDEPENDENT_AMBULATORY_CARE_PROVIDER_SITE_OTHER): Payer: Medicare Other | Admitting: Obstetrics and Gynecology

## 2020-04-13 ENCOUNTER — Encounter: Payer: Self-pay | Admitting: Obstetrics and Gynecology

## 2020-04-13 ENCOUNTER — Other Ambulatory Visit: Payer: Self-pay

## 2020-04-13 VITALS — BP 118/88 | HR 76 | Wt 314.0 lb

## 2020-04-13 DIAGNOSIS — Z1331 Encounter for screening for depression: Secondary | ICD-10-CM

## 2020-04-13 DIAGNOSIS — Z8759 Personal history of other complications of pregnancy, childbirth and the puerperium: Secondary | ICD-10-CM

## 2020-04-13 NOTE — Progress Notes (Signed)
Obstetrics and Gynecology Postpartum Visit  Appointment Date: 04/13/2020  OBGYN Clinic: [facility]  Primary Care Provider: Cain Boyer  Chief Complaint:  Chief Complaint  Patient presents with  . Follow-up    History of Present Illness: Erika Boyer is a 24 y.o. G2P0110 (No LMP recorded (lmp unknown).), seen for the above chief complaint. Preg c/b fetus with FGR, T21 on cffdna, fetal cardiac anomalies. Maternal h/o significant for SLE, CHTN (no meds), cerebellar degeneration, remote h/o maternal pericardial effusion; pt was discharged on PPD#1  Vaginal bleeding or discharge: No  Intercourse: No  Contraception after delivery: abstinence PP depression s/s: Yes  Any bowel or bladder issues: No  Pap smear: no abnormalities (date: 2021)  Review of Systems:  as noted in the History of Present Illness.  Patient Active Problem List   Diagnosis Date Noted  . Postpartum depression 03/25/2020  . BMI 50.0-59.9, adult (HCC) 03/08/2020  . Fetal cardiac anomaly affecting pregnancy, antepartum 02/11/2020  . Pericardial effusion with cardiac tamponade 01/21/2020  . Nutritional counseling 01/21/2020  . Abnormal screening blood test for Down syndrome in first trimester 12/23/2019  . S/P pericardiocentesis 07/29/2019  . Headache 07/02/2018  . Anxiety 06/18/2017  . 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 SLE (systemic lupus erythematosus) (HCC) 10/30/2016  . Cognitive disorder   . Debility 10/12/2016  . Cerebral atrophy (HCC)   . Intellectual disability   . Drug-induced lupus erythematosus   . Tachycardia   . Hypokalemia   . Hyperthyroidism 10/03/2016  . Morbid obesity with BMI of 40.0-44.9, adult (HCC) 10/03/2016  . 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 Cardiomegaly 10/03/2016  . Pleural effusion, left   . Graves disease 08/23/2016  . 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 Low vitamin D level 08/23/2015  . History of chlamydia  02/25/2012  . Unspecified mood (affective) disorder (HCC) 07/09/2011  . Neuropathy 04/10/2011    Medications Erika Boyer had no medications administered during this visit. Current Outpatient Medications  Medication Sig Dispense Refill  . acetaminophen (TYLENOL) 500 MG tablet Take 1 tablet (500 mg total) by mouth every 6 (six) hours as needed. 30 tablet 0  . albuterol (PROAIR HFA) 108 (90 Base) MCG/ACT inhaler Inhale 2 puffs into the lungs every 4 (four) hours as needed for wheezing or shortness of breath.     . ferrous sulfate 325 (65 FE) MG tablet Take 1 tablet (325 mg total) by mouth daily with breakfast. 30 tablet 0  . hydroxychloroquine (PLAQUENIL) 200 MG tablet TK 2 TS PO QD WF OR MILK  2  . ibuprofen (ADVIL) 600 MG tablet Take 1 tablet (600 mg total) by mouth every 6 (six) hours. 30 tablet 0  . predniSONE (DELTASONE) 1 MG tablet Take 1 tablet (1 mg total) by mouth daily with breakfast. 90 tablet 0  . prenatal vitamin w/FE, FA (PRENATAL 1 + 1) 27-1 MG TABS tablet Take 1 tablet by mouth daily at 12 noon. 30 tablet 12  . aspirin EC 81 MG tablet Take 1 tablet (81 mg total) by mouth daily. Take after 12 weeks for prevention of preeclampsia later in pregnancy (Patient not taking: Reported on 03/25/2020) 300 tablet 2  . docusate sodium (COLACE) 100 MG capsule Take 1 capsule (100 mg total) by mouth 2 (two) times daily as needed. (Patient not taking: Reported on 04/13/2020) 30 capsule 2  . pantoprazole (PROTONIX) 20 MG tablet Take 1 tablet (20 mg total) by mouth daily. (Patient not taking: Reported on 03/25/2020)  30 tablet 3   No current facility-administered medications for this visit.    Allergies Shellfish allergy, Morphine and related, Mushroom extract complex, and Tapazole [methimazole]  Physical Exam:  BP 118/88   Pulse 76   Wt (!) 314 lb (142.4 kg)   LMP  (LMP Unknown)   BMI 52.25 kg/m  Body mass index is 52.25 kg/m. General appearance: Well nourished, well developed female in  no acute distress.  Neuro/Psych:  Normal mood and affect.  Skin:  Warm and dry.   Laboratory: No new labs  Assessment: pt stable  Plan:  She denies any thoughts of wanting to harm herself. I d/w her re: resources if she thinks she may want to harm herself and to let us know or to go a hospital for evaluation if she feels like her depression worsens. She declines to see [facility] today; we will ask BH to touch base with her later to follow up with her  She declines anything for [facility]. She states she has f/u with her PCP already scheduled.   Orders Placed This Encounter  Procedures  . Ambulatory referral to Integrated Behavioral Health    RTC 2-93m for an annual   Cornelia Copa MD Attending [facility] for [employer] [facility])

## 2020-04-13 NOTE — BH Specialist Note (Signed)
Integrated Behavioral Health via Telemedicine Visit  04/13/2020 Erika Boyer [id number]  Pt did not arrive to video visit and did not answer the phone ; Pt does not have voicemail set up, so unable to leave voice message ; left MyChart message for patient.    Valetta Close Gayanne Prescott, LCSW

## 2020-04-22 ENCOUNTER — Telehealth: Payer: Self-pay | Admitting: Clinical

## 2020-04-22 NOTE — Telephone Encounter (Signed)
Called client with a reminder of their upcoming [facility] virtual appointment. Pt did not have a voicemail set up so a message was unable to be left.   KTere[employer] Coop

## 2020-04-26 ENCOUNTER — Ambulatory Visit: Payer: Medicare Other | Admitting: Clinical

## 2020-04-26 DIAGNOSIS — Z91199 Patient's noncompliance with other medical treatment and regimen due to unspecified reason: Secondary | ICD-10-CM

## 2020-04-26 DIAGNOSIS — Z5329 Procedure and treatment not carried out because of patient's decision for other reasons: Secondary | ICD-10-CM

## 2020-05-12 ENCOUNTER — Ambulatory Visit: Payer: Medicare Other | Admitting: Family Medicine

## 2020-05-12 ENCOUNTER — Other Ambulatory Visit: Payer: Self-pay

## 2020-05-12 ENCOUNTER — Other Ambulatory Visit (HOSPITAL_COMMUNITY)
Admission: RE | Admit: 2020-05-12 | Discharge: 2020-05-12 | Disposition: A | Discharge status: Home / Self Care | Payer: Medicare Other | Source: Ambulatory Visit | Attending: Critical Care Medicine | Admitting: Critical Care Medicine

## 2020-05-12 ENCOUNTER — Encounter: Payer: Self-pay | Admitting: Internal Medicine

## 2020-05-12 ENCOUNTER — Ambulatory Visit: Payer: Medicare Other | Attending: Critical Care Medicine | Admitting: Internal Medicine

## 2020-05-12 VITALS — BP 129/84 | HR 78 | Temp 98.5°F | Resp 16 | Wt 318.0 lb

## 2020-05-12 DIAGNOSIS — Z113 Encounter for screening for infections with a predominantly sexual mode of transmission: Secondary | ICD-10-CM

## 2020-05-12 DIAGNOSIS — Z114 Encounter for screening for human immunodeficiency virus [HIV]: Secondary | ICD-10-CM | POA: Diagnosis not present

## 2020-05-12 NOTE — Progress Notes (Signed)
Wants STD testing  She is sexually active with one partner- for the past 6 weeks. This is a new partner. She had a miscarriage approx 8 weeks ago  I have reviewed labs. Note hgb of 9.2 post partum She had IUFD in October. High risk trisomy 2.   Past Medical History:  Diagnosis Date  . Anemia   . Anxiety   . Arthritis    "right knee" (10/11/2016)  . 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 Cervical high risk HPV (human papillomavirus) test positive 03/20/2017   Pap q year  . Chronic bronchitis (HCC)   . Daily headache   . Depression   . Graves disease 2012   hx; "was on RX; it disappeared" (10/11/2016)  . History of blood transfusion    "related to the anemia" (10/11/2016)  . Hypertension   . Lupus (HCC)    "not sure what kind" (10/11/2016)  . Microcytic anemia 10/03/2016  . Pneumonia 10/03/2016    Social History   Socioeconomic History  . Marital status: Single    Spouse name: Not on file  . Number of children: 0  . Years of education: 44  . Highest education level: Some college, no degree  Occupational History  . Not on file  Tobacco Use  . Smoking status: Never Smoker  . Smokeless tobacco: Never Used  Vaping Use  . Vaping Use: Never used  Substance and Sexual Activity  . Alcohol use: No  . Drug use: No  . Sexual activity: Yes  Other Topics Concern  . Not on file  Social History Narrative   Lives at home w/ her mom   Right-handed   Caffeine: occasional coffee   Has home care pca for adl's   Social Determinants of Health   Financial Resource Strain: Not on file  Food Insecurity: No Food Insecurity  . Worried About Programme researcher, broadcasting/film/video in the Last Year: Never true  . Ran Out of Food in the Last Year: Never true  Transportation Needs: No Transportation Needs  . Lack of Transportation (Medical): No  . Lack of Transportation (Non-Medical): No  Physical Activity: Not on file  Stress: Not on file  Social Connections: Not on file  Intimate Partner Violence: Not on  file    Past Surgical History:  Procedure Laterality Date  . PERICARDIOCENTESIS N/A 10/04/2016   Procedure: Pericardiocentesis;  Surgeon: Yates Decamp, MD;  [facility] INVASIVE CV LAB;  Service: Cardiovascular;  Laterality: N/A;  . REDUCTION MAMMAPLASTY Bilateral 2012  . TONSILLECTOMY      Family History  Problem Relation Age of Onset  . Thalassemia Mother   . Arthritis Mother   . Breast cancer Mother        Mastectomy in early 61's.    . High blood pressure Father   . Mental illness Father        question bipolar disorder per wife  . Sickle cell trait Maternal Aunt   . Breast cancer Maternal Aunt        4 maternal aunts w/breast cancer  . Breast cancer Maternal Grandmother        Bone  . Multiple sclerosis Cousin   . Diabetes Paternal Grandfather     Allergies  Allergen Reactions  . Shellfish Allergy Anaphylaxis  . Morphine And Related Itching  . Mushroom Extract Complex Other (See Comments)    Mother is allergic, so patient avoids these  . Tapazole [Methimazole] Swelling and Rash    WELTS (also) Mother suspects this  MAY be an allergy, as the onset of symptoms coincided with this being started    Current Outpatient Medications on File Prior to Visit  Medication Sig Dispense Refill  . acetaminophen (TYLENOL) 500 MG tablet Take 1 tablet (500 mg total) by mouth every 6 (six) hours as needed. 30 tablet 0  . albuterol (VENTOLIN HFA) 108 (90 Base) MCG/ACT inhaler Inhale 2 puffs into the lungs every 4 (four) hours as needed for wheezing or shortness of breath.     . hydroxychloroquine (PLAQUENIL) 200 MG tablet TK 2 TS PO QD WF OR MILK  2  . ibuprofen (ADVIL) 600 MG tablet Take 1 tablet (600 mg total) by mouth every 6 (six) hours. 30 tablet 0  . prenatal vitamin w/FE, FA (PRENATAL 1 + 1) 27-1 MG TABS tablet Take 1 tablet by mouth daily at 12 noon. 30 tablet 12  . ferrous sulfate 325 (65 FE) MG tablet Take 1 tablet (325 mg total) by mouth daily with breakfast. 30 tablet 0   No  current facility-administered medications on file prior to visit.     patient denies chest pain, shortness of breath, orthopnea. Denies lower extremity edema, abdominal pain, change in appetite, change in bowel movements. Patient denies rashes, musculoskeletal complaints. No other specific complaints in a complete review of systems.   BP 129/84   Pulse 78   Temp 98.5 F (36.9 C) (Oral)   Resp 16   Wt (!) 318 lb (144.2 kg)   LMP 04/19/2020 (Exact Date)   SpO2 100%   BMI 52.92 kg/m   Well-developed well-nourished female in no acute distress. HEENT exam atraumatic, normocephalic, extraocular muscles are intact. Neck is supple. No jugular venous distention no thyromegaly. Chest clear to auscultation without increased work of breathing. Cardiac exam S1 and S2 are regular. Abdominal exam active bowel sounds, soft, nontender. Severe obesity.  Extremities no edema. Neurologic exam she is alert without any motor sensory deficits. Gait is normal.  A/p- request for STI screening completed She has a new partner- reasonable to screen for HIV.

## 2020-05-13 LAB — HIV ANTIBODY (ROUTINE TESTING W REFLEX): HIV Screen 4th Generation wRfx: NONREACTIVE

## 2020-05-16 LAB — CERVICOVAGINAL ANCILLARY ONLY
Bacterial Vaginitis (gardnerella): NEGATIVE
Candida Glabrata: NEGATIVE
Candida Vaginitis: NEGATIVE
Chlamydia: NEGATIVE
Comment: NEGATIVE
Comment: NEGATIVE
Comment: NEGATIVE
Comment: NEGATIVE
Comment: NEGATIVE
Comment: NORMAL
Neisseria Gonorrhea: NEGATIVE
Trichomonas: NEGATIVE

## 2020-05-18 ENCOUNTER — Ambulatory Visit: Payer: Medicare Other | Admitting: Critical Care Medicine

## 2020-05-18 NOTE — Telephone Encounter (Signed)
Opened in error

## 2020-06-11 ENCOUNTER — Encounter (INDEPENDENT_AMBULATORY_CARE_PROVIDER_SITE_OTHER): Payer: Self-pay

## 2020-06-21 ENCOUNTER — Ambulatory Visit: Payer: Medicare Other | Admitting: Obstetrics and Gynecology

## 2020-06-22 ENCOUNTER — Ambulatory Visit (INDEPENDENT_AMBULATORY_CARE_PROVIDER_SITE_OTHER): Payer: Medicare Other | Admitting: Obstetrics and Gynecology

## 2020-06-22 ENCOUNTER — Encounter: Payer: Self-pay | Admitting: Obstetrics and Gynecology

## 2020-06-22 ENCOUNTER — Other Ambulatory Visit: Payer: Self-pay

## 2020-06-22 ENCOUNTER — Other Ambulatory Visit (HOSPITAL_COMMUNITY)
Admission: RE | Admit: 2020-06-22 | Discharge: 2020-06-22 | Disposition: A | Discharge status: Home / Self Care | Payer: Medicare Other | Source: Ambulatory Visit | Attending: Obstetrics and Gynecology | Admitting: Obstetrics and Gynecology

## 2020-06-22 VITALS — BP 123/92 | HR 85 | Ht 63.0 in | Wt 332.0 lb

## 2020-06-22 DIAGNOSIS — Z113 Encounter for screening for infections with a predominantly sexual mode of transmission: Secondary | ICD-10-CM | POA: Insufficient documentation

## 2020-06-22 NOTE — Progress Notes (Signed)
   History:  [name] is a 25 y.o. G2P0110 who presents to clinic today for std and pregnancy testing.    Desires pregnancy, taking PNV. Recent history of IUFD at 26 weeks.  -reports feeling tired, headaches, dizziness. Patient has hx of cerebellar degeneration.   No new sexual partners  Sexually active Has had STD in past  -no pain with intercourse   The following portions of the patient's history were reviewed and updated as appropriate: allergies, current medications, family history, past medical history, social history, past surgical history and problem list.  Review of Systems:  Review of Systems  Constitutional: Negative for chills and fever.  Cardiovascular: Negative for chest pain.  Neurological: Negative for dizziness and headaches.  Psychiatric/Behavioral: Negative for depression.      Objective:  Physical Exam BP (!) 123/92   Pulse 85   Ht 5\' 3"  (1.6 m)   Wt (!) 332 lb (150.6 kg)   LMP 06/11/2020 (Exact Date)   Breastfeeding No   BMI 58.81 kg/m  Physical Exam Vitals and nursing note reviewed.  Constitutional:      Appearance: Normal appearance.  Pulmonary:     Effort: Pulmonary effort is normal.  Genitourinary:    General: Normal vulva.  Skin:    General: Skin is warm and dry.  Neurological:     Mental Status: She is alert.  Psychiatric:        Mood and Affect: Mood normal.        Behavior: Behavior normal.       Labs and Imaging No results found for this or any previous visit (from the past 24 hour(s)).  No results found.   Assessment & Plan:   Screening for STD's Screening for pregnancy -cervicovag swab performed, UPT neg -continue PNV   Of note patient fell off exam table after [place] [facility] and myself left the room. Fall was unwitnessed but heard. Carrie and I entered the room afterwards and saw patient on the floor. We evaluated her and she reported no injuries, denies head trauma. Was able to ambulate unassisted out of exam  room. Safety zone report completed.   [facility], MD 06/22/2020 4:29 PM  08/20/2020, MD [facility] Fellow, [facility] for [facility], [facility]

## 2020-06-28 ENCOUNTER — Other Ambulatory Visit: Payer: Self-pay | Admitting: Obstetrics and Gynecology

## 2020-06-28 LAB — CERVICOVAGINAL ANCILLARY ONLY
Bacterial Vaginitis (gardnerella): POSITIVE — AB
Candida Glabrata: NEGATIVE
Candida Vaginitis: NEGATIVE
Chlamydia: NEGATIVE
Comment: NEGATIVE
Comment: NEGATIVE
Comment: NEGATIVE
Comment: NEGATIVE
Comment: NEGATIVE
Comment: NORMAL
Neisseria Gonorrhea: NEGATIVE
Trichomonas: NEGATIVE

## 2020-06-28 MED ORDER — METRONIDAZOLE 500 MG PO TABS
500.0000 mg | ORAL_TABLET | Freq: Two times a day (BID) | ORAL | 0 refills | Status: AC
Start: 2020-06-28 — End: 2020-07-05

## 2020-07-14 DIAGNOSIS — M329 Systemic lupus erythematosus, unspecified: Secondary | ICD-10-CM | POA: Diagnosis not present

## 2020-07-14 DIAGNOSIS — M2550 Pain in unspecified joint: Secondary | ICD-10-CM | POA: Diagnosis not present

## 2020-07-14 DIAGNOSIS — J90 Pleural effusion, not elsewhere classified: Secondary | ICD-10-CM | POA: Diagnosis not present

## 2020-07-14 DIAGNOSIS — I313 Pericardial effusion (noninflammatory): Secondary | ICD-10-CM | POA: Diagnosis not present

## 2020-07-14 DIAGNOSIS — Z6841 Body Mass Index (BMI) 40.0 and over, adult: Secondary | ICD-10-CM | POA: Diagnosis not present

## 2020-07-14 DIAGNOSIS — Z79899 Other long term (current) drug therapy: Secondary | ICD-10-CM | POA: Diagnosis not present

## 2020-09-19 DIAGNOSIS — Z0289 Encounter for other administrative examinations: Secondary | ICD-10-CM

## 2020-09-22 ENCOUNTER — Telehealth: Payer: Self-pay | Admitting: Critical Care Medicine

## 2020-10-13 ENCOUNTER — Ambulatory Visit: Payer: Medicare Other | Admitting: Physician Assistant

## 2020-10-13 DIAGNOSIS — J90 Pleural effusion, not elsewhere classified: Secondary | ICD-10-CM | POA: Diagnosis not present

## 2020-10-13 DIAGNOSIS — D508 Other iron deficiency anemias: Secondary | ICD-10-CM | POA: Diagnosis not present

## 2020-10-13 DIAGNOSIS — M329 Systemic lupus erythematosus, unspecified: Secondary | ICD-10-CM | POA: Diagnosis not present

## 2020-10-13 DIAGNOSIS — Z6841 Body Mass Index (BMI) 40.0 and over, adult: Secondary | ICD-10-CM | POA: Diagnosis not present

## 2020-10-13 DIAGNOSIS — I313 Pericardial effusion (noninflammatory): Secondary | ICD-10-CM | POA: Diagnosis not present

## 2020-10-13 DIAGNOSIS — M2550 Pain in unspecified joint: Secondary | ICD-10-CM | POA: Diagnosis not present

## 2020-10-13 DIAGNOSIS — Z79899 Other long term (current) drug therapy: Secondary | ICD-10-CM | POA: Diagnosis not present

## 2020-10-20 ENCOUNTER — Ambulatory Visit: Payer: Medicare HMO | Attending: Physician Assistant | Admitting: Critical Care Medicine

## 2020-10-20 ENCOUNTER — Other Ambulatory Visit: Payer: Self-pay

## 2020-10-20 ENCOUNTER — Encounter: Payer: Self-pay | Admitting: Critical Care Medicine

## 2020-10-20 VITALS — BP 106/75 | HR 92 | Temp 97.7°F | Resp 16 | Ht 66.89 in | Wt 331.6 lb

## 2020-10-20 DIAGNOSIS — Z6841 Body Mass Index (BMI) 40.0 and over, adult: Secondary | ICD-10-CM

## 2020-10-20 DIAGNOSIS — M329 Systemic lupus erythematosus, unspecified: Secondary | ICD-10-CM

## 2020-10-20 DIAGNOSIS — Z3200 Encounter for pregnancy test, result unknown: Secondary | ICD-10-CM | POA: Diagnosis not present

## 2020-10-20 DIAGNOSIS — I313 Pericardial effusion (noninflammatory): Secondary | ICD-10-CM

## 2020-10-20 DIAGNOSIS — E0590 Thyrotoxicosis, unspecified without thyrotoxic crisis or storm: Secondary | ICD-10-CM

## 2020-10-20 DIAGNOSIS — J40 Bronchitis, not specified as acute or chronic: Secondary | ICD-10-CM | POA: Insufficient documentation

## 2020-10-20 DIAGNOSIS — G479 Sleep disorder, unspecified: Secondary | ICD-10-CM | POA: Diagnosis not present

## 2020-10-20 DIAGNOSIS — G319 Degenerative disease of nervous system, unspecified: Secondary | ICD-10-CM | POA: Diagnosis not present

## 2020-10-20 DIAGNOSIS — Z23 Encounter for immunization: Secondary | ICD-10-CM

## 2020-10-20 DIAGNOSIS — I314 Cardiac tamponade: Secondary | ICD-10-CM

## 2020-10-20 MED ORDER — ALBUTEROL SULFATE HFA 108 (90 BASE) MCG/ACT IN AERS: 2.0000 | INHALATION_SPRAY | RESPIRATORY_TRACT | 0 refills | Status: DC | PRN

## 2020-10-20 NOTE — Assessment & Plan Note (Signed)
Continue regular follow up with neurology.  Follow up in 4 months.

## 2020-10-20 NOTE — Progress Notes (Signed)
New Patient Office Visit  Subjective:  Patient ID: Erika Boyer, female    DOB: May 21, 1996  Age: 25 y.o. MRN: [id number]  CC:  Chief Complaint  Patient presents with  . Establish Care    HPI Erika Boyer presents for PCP to establish care. The patient has a history of systemic lupus erythematosus, pericardial effusions, cerebellar degeneration, obesity, and sleep problems. The patient states she has been seeing a rheumatologist for many years. Her most recent appointment with rheumatology was last week. Her symptoms are well managed on hydroxychlorquine 200 mg two tabs once daily. She reports occasional, intermittent, brief pains in her body due to her lupus. These pains can occur anywhere in her body. She takes Tylenol or Motrin as needed for these pains which is helpful.   The patient also reports difficulty sleeping over the past 10 days. She reports waking up frequently throughout the night. She feels this may be due to stress. She has tried taking Benadryl before bed which has not been helpful. She reports frequent TV and phone use immediately before bed. The patient goes to bed around 1 AM.   The patient also reports a history of pericardial effusions which is well controlled on low dose Prednisone daily.   The patient also states she has chronic bronchitis for which she takes albuterol as needed.   She reports she has a history of cerebellar degeneration which was diagnosed when she was 25 years old. This condition causes gait problems for the patient and she is followed by a neurologist.   The patient would like a referral to bariatric surgeon to be evaluated. The patient had seen a bariatric surgeon in the past for evaluation before her previous pregnancy and would like to be seen at the same practice.   Past Medical History:  Diagnosis Date  . Anemia   . Anxiety   . Arthritis    "right knee" (10/11/2016)  . 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 Cervical high risk HPV  (human papillomavirus) test positive 03/20/2017   Pap q year  . Chronic bronchitis (HCC)   . Daily headache   . Depression   . Graves disease 2012   hx; "was on RX; it disappeared" (10/11/2016)  . History of blood transfusion    "related to the anemia" (10/11/2016)  . Hypertension   . Lupus (HCC)    "not sure what kind" (10/11/2016)  . Microcytic anemia 10/03/2016  . Pneumonia 10/03/2016    Past Surgical History:  Procedure Laterality Date  . PERICARDIOCENTESIS N/A 10/04/2016   Procedure: Pericardiocentesis;  Surgeon: Yates Decamp, MD;  Location: [facility] INVASIVE CV LAB;  Service: Cardiovascular;  Laterality: N/A;  . REDUCTION MAMMAPLASTY Bilateral 2012  . TONSILLECTOMY      Family History  Problem Relation Age of Onset  . Thalassemia Mother   . Arthritis Mother   . Breast cancer Mother        Mastectomy in early 51's.    . High blood pressure Father   . Mental illness Father        question bipolar disorder per wife  . Sickle cell trait Maternal Aunt   . Breast cancer Maternal Aunt        4 maternal aunts w/breast cancer  . Breast cancer Maternal Grandmother        Bone  . Multiple sclerosis Cousin   . Diabetes Paternal Grandfather     Social History   Socioeconomic History  . Marital status: Single  Spouse name: Not on file  . Number of children: 0  . Years of education: 39  . Highest education level: Some college, no degree  Occupational History  . Not on file  Tobacco Use  . Smoking status: Never Smoker  . Smokeless tobacco: Never Used  Vaping Use  . Vaping Use: Never used  Substance and Sexual Activity  . Alcohol use: No  . Drug use: No  . Sexual activity: Yes  Other Topics Concern  . Not on file  Social History Narrative   Lives at home w/ her mom   Right-handed   Caffeine: occasional coffee   Has home care pca for adl's   Social Determinants of Health   Financial Resource Strain: Not on file  Food Insecurity: No Food Insecurity  . Worried About  Programme researcher, broadcasting/film/video in the Last Year: Never true  . Ran Out of Food in the Last Year: Never true  Transportation Needs: No Transportation Needs  . Lack of Transportation (Medical): No  . Lack of Transportation (Non-Medical): No  Physical Activity: Not on file  Stress: Not on file  Social Connections: Not on file  Intimate Partner Violence: Not on file    ROS Review of Systems  Constitutional: Positive for fatigue.  HENT: Negative.  Negative for trouble swallowing and voice change.   Respiratory: Negative for cough, choking, shortness of breath and wheezing.   Cardiovascular: Negative for chest pain, palpitations and leg swelling.  Gastrointestinal: Positive for constipation and nausea. Negative for abdominal distention, abdominal pain, anal bleeding, blood in stool, diarrhea, rectal pain and vomiting.       Gerd  Genitourinary: Negative.   Musculoskeletal: Positive for arthralgias and gait problem.  Neurological: Positive for dizziness, weakness, light-headedness and headaches. Negative for tremors, seizures and syncope.  Hematological: Negative for adenopathy. Does not bruise/bleed easily.  Psychiatric/Behavioral: Positive for behavioral problems, decreased concentration, dysphoric mood and sleep disturbance. Negative for self-injury and suicidal ideas. The patient is nervous/anxious.     Objective:   Today's Vitals: BP 106/75 (BP Location: Right Arm, Patient Position: Sitting, Cuff Size: Large)   Pulse 92   Temp 97.7 F (36.5 C)   Resp 16   Ht 5' 6.89" (1.699 m)   Wt (!) 331 lb 9.6 oz (150.4 kg)   SpO2 100%   BMI 52.11 kg/m   Physical Exam Vitals and nursing note reviewed.  Constitutional:      General: She is not in acute distress.    Appearance: She is obese.     Comments: Morbidly OBESE  HENT:     Head: Normocephalic and atraumatic.     Mouth/Throat:     Mouth: Mucous membranes are moist.     Pharynx: Oropharynx is clear.  Eyes:     Conjunctiva/sclera:  Conjunctivae normal.  Cardiovascular:     Rate and Rhythm: Normal rate and regular rhythm.     Heart sounds: Normal heart sounds. No murmur heard. No friction rub. No gallop.   Pulmonary:     Effort: Pulmonary effort is normal. No respiratory distress.     Breath sounds: Normal breath sounds. No wheezing.  Abdominal:     General: There is no distension.     Palpations: Abdomen is soft.     Tenderness: There is no abdominal tenderness.  Musculoskeletal:        General: No swelling or deformity.     Cervical back: Normal range of motion.  Neurological:     Mental Status:  She is alert and oriented to person, place, and time.  Psychiatric:        Mood and Affect: Mood normal.        Behavior: Behavior normal.     Assessment & Plan:   Problem List Items Addressed This Visit      Cardiovascular and Mediastinum   Pericardial effusion with cardiac tamponade    Continue regular follow up with cardiology. Continue Prednisone 1 mg daily.  Follow up in 4 months        Respiratory   Bronchitis    Continue albuterol inhaler as needed. Follow up in 4 months.         Endocrine   Hyperthyroidism   Relevant Orders   Thyroid Panel With TSH     Nervous and Auditory   Cerebellar degeneration (HCC)    Continue regular follow up with neurology.  Follow up in 4 months.         Other   Morbid obesity with BMI of 40.0-44.9, adult [facility])    Referral made to bariatric surgery. Follow up in 4 months        SLE (systemic lupus erythematosus) (HCC)    Continue hydroxychloroquine daily per rheumatology. Continue regular follow up with rheumatology. Follow up in 4 months.       Sleep difficulties    Discussed proper sleep hygiene.  Provided with insomnia informational sheet. Advised can take 10 mg of melatonin before bedtime. Follow up in 4 months.        Other Visit Diagnoses    Need for HPV vaccination    -  Primary   Relevant Orders   HPV 9-valent vaccine,Recombinat  (Completed)   Encounter for pregnancy test, result unknown       Morbid obesity with BMI of 50.0-59.9, adult (HCC)       Relevant Orders   Comprehensive metabolic panel   CBC with Differential/Platelet   Amb Referral to Bariatric Surgery   Hemoglobin A1c      Outpatient Encounter Medications as of 10/20/2020  Medication Sig  . acetaminophen (TYLENOL) 500 MG tablet Take 1 tablet (500 mg total) by mouth every 6 (six) hours as needed.  . ferrous sulfate 325 (65 FE) MG tablet Take 1 tablet (325 mg total) by mouth daily with breakfast.  . hydroxychloroquine (PLAQUENIL) 200 MG tablet TK 2 TS PO QD WF OR MILK  . ibuprofen (ADVIL) 600 MG tablet Take 1 tablet (600 mg total) by mouth every 6 (six) hours.  . predniSONE (DELTASONE) 1 MG tablet Take 1 mg by mouth daily with breakfast.  . [DISCONTINUED] albuterol (VENTOLIN HFA) 108 (90 Base) MCG/ACT inhaler Inhale 2 puffs into the lungs every 4 (four) hours as needed for wheezing or shortness of breath.   Marland Kitchen albuterol (VENTOLIN HFA) 108 (90 Base) MCG/ACT inhaler Inhale 2 puffs into the lungs every 4 (four) hours as needed for wheezing or shortness of breath.  . prenatal vitamin w/FE, FA (PRENATAL 1 + 1) 27-1 MG TABS tablet Take 1 tablet by mouth daily at 12 noon. (Patient not taking: Reported on 10/20/2020)   No facility-administered encounter medications on file as of 10/20/2020.    Follow-up: Return in about 2 months (around 12/20/2020).   Shan Levans

## 2020-10-20 NOTE — Assessment & Plan Note (Signed)
Continue hydroxychloroquine daily per rheumatology. Continue regular follow up with rheumatology. Follow up in 4 months.

## 2020-10-20 NOTE — Progress Notes (Signed)
Establish care needs shower chair Desires HPV vaccine

## 2020-10-20 NOTE — Assessment & Plan Note (Signed)
Continue regular follow up with cardiology. Continue Prednisone 1 mg daily.  Follow up in 4 months

## 2020-10-20 NOTE — Assessment & Plan Note (Signed)
Continue albuterol inhaler as needed. Follow up in 4 months.

## 2020-10-20 NOTE — Assessment & Plan Note (Signed)
Discussed proper sleep hygiene.  Provided with insomnia informational sheet. Advised can take 10 mg of melatonin before bedtime. Follow up in 4 months.

## 2020-10-20 NOTE — Patient Instructions (Signed)
An HPV vaccine was given you will come back for your second dose  Please get your COVID booster vaccine  Refills on albuterol given  Referral to bariatric surgery is made  Follow insomnia guidelines below and you take melatonin 10 mg an hour before going to bed  Complete set of screening health labs are obtained  Follow a healthy diet as outlined below as we discussed  Return to see Dr. Delford Field 4 months   Obesity, Adult Obesity is having too much body fat. Being obese means that your weight is more than what is healthy for you. BMI is a number that explains how much body fat you have. If you have a BMI of 30 or more, you are obese. Obesity is often caused by eating or drinking more calories than your body uses. Changing your lifestyle can help you lose weight. Obesity can cause serious health problems, such as:  Stroke.  Coronary artery disease (CAD).  Type 2 diabetes.  Some types of cancer, including cancers of the colon, breast, uterus, and gallbladder.  Osteoarthritis.  High blood pressure (hypertension).  High cholesterol.  Sleep apnea.  Gallbladder stones.  Infertility problems. What are the causes?  Eating meals each day that are high in calories, sugar, and fat.  Being born with genes that may make you more likely to become obese.  Having a medical condition that causes obesity.  Taking certain medicines.  Sitting a lot (having a sedentary lifestyle).  Not getting enough sleep.  Drinking a lot of drinks that have sugar in them. What increases the risk?  Having a family history of obesity.  Being an [place] American woman.  Being a Hispanic man.  Living in an area with limited access to: ? [place] [facility], recreation centers, or sidewalks. ? Healthy food choices, such as grocery stores and farmers' markets. What are the signs or symptoms? The main sign is having too much body fat. How is this treated?  Treatment for this condition often includes  changing your lifestyle. Treatment may include: ? Changing your diet. This may include making a healthy meal plan. ? Exercise. This may include activity that causes your heart to beat faster (aerobic exercise) and strength training. Work with your doctor to design a program that works for you. ? Medicine to help you lose weight. This may be used if you are not able to lose 1 pound a week after 6 weeks of healthy eating and more exercise. ? Treating conditions that cause the obesity. ? Surgery. Options may include gastric banding and gastric bypass. This may be done if:  Other treatments have not helped to improve your condition.  You have a BMI of 40 or higher.  You have life-threatening health problems related to obesity. Follow these instructions at home: Eating and drinking  Follow advice from your doctor about what to eat and drink. Your doctor may tell you to: ? Limit fast food, sweets, and processed snack foods. ? Choose low-fat options. For example, choose low-fat milk instead of whole milk. ? Eat 5 or more servings of fruits or vegetables each day. ? Eat at home more often. This gives you more control over what you eat. ? Choose healthy foods when you eat out. ? Learn to read food labels. This will help you learn how much food is in 1 serving. ? Keep low-fat snacks available. ? Avoid drinks that have a lot of sugar in them. These include soda, fruit juice, iced tea with sugar, and flavored milk.  Drink enough water to keep your pee (urine) pale yellow.  Do not go on fad diets.   Physical activity  Exercise often, as told by your doctor. Most adults should get up to 150 minutes of moderate-intensity exercise every week.Ask your doctor: ? What types of exercise are safe for you. ? How often you should exercise.  Warm up and stretch before being active.  Do slow stretching after being active (cool down).  Rest between times of being active. Lifestyle  Work with your  doctor and a food expert (dietitian) to set a weight-loss goal that is best for you.  Limit your screen time.  Find ways to reward yourself that do not involve food.  Do not drink alcohol if: ? Your doctor tells you not to drink. ? You are pregnant, may be pregnant, or are planning to become pregnant.  If you drink alcohol: ? Limit how much you use to:  0-1 drink a day for women.  0-2 drinks a day for men. ? Be aware of how much alcohol is in your drink. In the [place] one drink equals one 12 oz bottle of beer (355 mL), one 5 oz glass of wine (148 mL), or one 1 oz glass of hard liquor (44 mL). General instructions  Keep a weight-loss journal. This can help you keep track of: ? The food that you eat. ? How much exercise you get.  Take over-the-counter and prescription medicines only as told by your doctor.  Take vitamins and supplements only as told by your doctor.  Think about joining a support group.  Keep all follow-up visits as told by your doctor. This is important. Contact a doctor if:  You cannot meet your weight loss goal after you have changed your diet and lifestyle for 6 weeks. Get help right away if you:  Are having trouble breathing.  Are having thoughts of harming yourself. Summary  Obesity is having too much body fat.  Being obese means that your weight is more than what is healthy for you.  Work with your doctor to set a weight-loss goal.  Get regular exercise as told by your doctor. This information is not intended to replace advice given to you by your health care provider. Make sure you discuss any questions you have with your health care provider. Document Revised: 01/09/2018 Document Reviewed: 01/09/2018 Elsevier Patient Education  2021 Elsevier Inc.  Insomnia Insomnia is a sleep disorder that makes it difficult to fall asleep or stay asleep. Insomnia can cause fatigue, low energy, difficulty concentrating, mood swings, and poor performance at  work or school. There are three different ways to classify insomnia:  Difficulty falling asleep.  Difficulty staying asleep.  Waking up too early in the morning. Any type of insomnia can be long-term (chronic) or short-term (acute). Both are common. Short-term insomnia usually lasts for three months or less. Chronic insomnia occurs at least three times a week for longer than three months. What are the causes? Insomnia may be caused by another condition, situation, or substance, such as:  Anxiety.  Certain medicines.  Gastroesophageal reflux disease (GERD) or other gastrointestinal conditions.  Asthma or other breathing conditions.  Restless legs syndrome, sleep apnea, or other sleep disorders.  Chronic pain.  Menopause.  Stroke.  Abuse of alcohol, tobacco, or illegal drugs.  Mental health conditions, such as depression.  Caffeine.  Neurological disorders, such as Alzheimer's disease.  An overactive thyroid (hyperthyroidism). Sometimes, the cause of insomnia may not be known. What  increases the risk? Risk factors for insomnia include:  Gender. Women are affected more often than men.  Age. Insomnia is more common as you get older.  Stress.  Lack of exercise.  Irregular work schedule or working night shifts.  Traveling between different time zones.  Certain medical and mental health conditions. What are the signs or symptoms? If you have insomnia, the main symptom is having trouble falling asleep or having trouble staying asleep. This may lead to other symptoms, such as:  Feeling fatigued or having low energy.  Feeling nervous about going to sleep.  Not feeling rested in the morning.  Having trouble concentrating.  Feeling irritable, anxious, or depressed. How is this diagnosed? This condition may be diagnosed based on:  Your symptoms and medical history. Your health care provider may ask about: ? Your sleep habits. ? Any medical conditions you  have. ? Your mental health.  A physical exam. How is this treated? Treatment for insomnia depends on the cause. Treatment may focus on treating an underlying condition that is causing insomnia. Treatment may also include:  Medicines to help you sleep.  Counseling or therapy.  Lifestyle adjustments to help you sleep better. Follow these instructions at home: Eating and drinking  Limit or avoid alcohol, caffeinated beverages, and cigarettes, especially close to bedtime. These can disrupt your sleep.  Do not eat a large meal or eat spicy foods right before bedtime. This can lead to digestive discomfort that can make it hard for you to sleep.   Sleep habits  Keep a sleep diary to help you and your health care provider figure out what could be causing your insomnia. Write down: ? When you sleep. ? When you wake up during the night. ? How well you sleep. ? How rested you feel the next day. ? Any side effects of medicines you are taking. ? What you eat and drink.  Make your bedroom a dark, comfortable place where it is easy to fall asleep. ? Put up shades or blackout curtains to block light from outside. ? Use a white noise machine to block noise. ? Keep the temperature cool.  Limit screen use before bedtime. This includes: ? Watching TV. ? Using your smartphone, tablet, or computer.  Stick to a routine that includes going to bed and waking up at the same times every day and night. This can help you fall asleep faster. Consider making a quiet activity, such as reading, part of your nighttime routine.  Try to avoid taking naps during the day so that you sleep better at night.  Get out of bed if you are still awake after 15 minutes of trying to sleep. Keep the lights down, but try reading or doing a quiet activity. When you feel sleepy, go back to bed.   General instructions  Take over-the-counter and prescription medicines only as told by your health care provider.  Exercise  regularly, as told by your health care provider. Avoid exercise starting several hours before bedtime.  Use relaxation techniques to manage stress. Ask your health care provider to suggest some techniques that may work well for you. These may include: ? Breathing exercises. ? Routines to release muscle tension. ? Visualizing peaceful scenes.  Make sure that you drive carefully. Avoid driving if you feel very sleepy.  Keep all follow-up visits as told by your health care provider. This is important. Contact a health care provider if:  You are tired throughout the day.  You have trouble in  your daily routine due to sleepiness.  You continue to have sleep problems, or your sleep problems get worse. Get help right away if:  You have serious thoughts about hurting yourself or someone else. If you ever feel like you may hurt yourself or others, or have thoughts about taking your own life, get help right away. You can go to your nearest emergency department or call:  Your local emergency services (911 in the [place]  A suicide crisis helpline, such as the National Suicide Prevention Lifeline at 726-046-4402. This is open 24 hours a day. Summary  Insomnia is a sleep disorder that makes it difficult to fall asleep or stay asleep.  Insomnia can be long-term (chronic) or short-term (acute).  Treatment for insomnia depends on the cause. Treatment may focus on treating an underlying condition that is causing insomnia.  Keep a sleep diary to help you and your health care provider figure out what could be causing your insomnia. This information is not intended to replace advice given to you by your health care provider. Make sure you discuss any questions you have with your health care provider. Document Revised: 03/17/2020 Document Reviewed: 03/17/2020 Elsevier Patient Education  2021 [employer].

## 2020-10-20 NOTE — Assessment & Plan Note (Signed)
Referral made to bariatric surgery. Follow up in 4 months

## 2020-10-21 ENCOUNTER — Telehealth: Payer: Self-pay

## 2020-10-21 LAB — CBC WITH DIFFERENTIAL/PLATELET
Basophils Absolute: 0 10*3/uL (ref 0.0–0.2)
Basos: 1 %
EOS (ABSOLUTE): 0 10*3/uL (ref 0.0–0.4)
Eos: 0 %
Hematocrit: 40.3 % (ref 34.0–46.6)
Hemoglobin: 11.9 g/dL (ref 11.1–15.9)
Immature Grans (Abs): 0 10*3/uL (ref 0.0–0.1)
Immature Granulocytes: 1 %
Lymphocytes Absolute: 1 10*3/uL (ref 0.7–3.1)
Lymphs: 29 %
MCH: 24 pg — ABNORMAL LOW (ref 26.6–33.0)
MCHC: 29.5 g/dL — ABNORMAL LOW (ref 31.5–35.7)
MCV: 81 fL (ref 79–97)
Monocytes Absolute: 0.3 10*3/uL (ref 0.1–0.9)
Monocytes: 8 %
Neutrophils Absolute: 2.1 10*3/uL (ref 1.4–7.0)
Neutrophils: 61 %
Platelets: 311 10*3/uL (ref 150–450)
RBC: 4.96 x10E6/uL (ref 3.77–5.28)
RDW: 14.3 % (ref 11.7–15.4)
WBC: 3.3 10*3/uL — ABNORMAL LOW (ref 3.4–10.8)

## 2020-10-21 LAB — COMPREHENSIVE METABOLIC PANEL
ALT: 17 IU/L (ref 0–32)
AST: 22 IU/L (ref 0–40)
Albumin/Globulin Ratio: 1 — ABNORMAL LOW (ref 1.2–2.2)
Albumin: 4.1 g/dL (ref 3.9–5.0)
Alkaline Phosphatase: 84 IU/L (ref 44–121)
BUN/Creatinine Ratio: 14 (ref 9–23)
BUN: 12 mg/dL (ref 6–20)
Bilirubin Total: 0.4 mg/dL (ref 0.0–1.2)
CO2: 23 mmol/L (ref 20–29)
Calcium: 9.6 mg/dL (ref 8.7–10.2)
Chloride: 103 mmol/L (ref 96–106)
Creatinine, Ser: 0.85 mg/dL (ref 0.57–1.00)
Globulin, Total: 4 g/dL (ref 1.5–4.5)
Glucose: 75 mg/dL (ref 65–99)
Potassium: 4.2 mmol/L (ref 3.5–5.2)
Sodium: 140 mmol/L (ref 134–144)
Total Protein: 8.1 g/dL (ref 6.0–8.5)
eGFR: 98 mL/min/{1.73_m2} (ref >59)

## 2020-10-21 LAB — HEMOGLOBIN A1C
Est. average glucose Bld gHb Est-mCnc: 105 mg/dL
Hgb A1c MFr Bld: 5.3 % (ref 4.8–5.6)

## 2020-10-21 LAB — THYROID PANEL WITH TSH
Free Thyroxine Index: 2.3 (ref 1.2–4.9)
T3 Uptake Ratio: 26 % (ref 24–39)
T4, Total: 8.8 ug/dL (ref 4.5–12.0)
TSH: 2.02 u[IU]/mL (ref 0.450–4.500)

## 2020-10-21 NOTE — Telephone Encounter (Signed)
Patient name and DOB has been verified Patient was informed of lab results. Patient had no questions.  

## 2020-10-21 NOTE — Telephone Encounter (Signed)
-----   Message from Storm Frisk, MD sent at 10/21/2020  7:33 AM EDT ----- Let pt know kidney, liver blood counts normal.   Thyroid normal.  No need for any thyroid medications.  No diabetes.

## 2020-11-02 ENCOUNTER — Telehealth: Payer: Self-pay | Admitting: Critical Care Medicine

## 2020-11-02 NOTE — Telephone Encounter (Signed)
Erika Boyer I am ok making a referral for personal care assistant Will need help on the paper work... I recall there is a form

## 2020-11-02 NOTE — Telephone Encounter (Signed)
Copied from CRM [id number]. Topic: Referral - Request for Referral >> Oct 27, 2020 11:07 AM Leafy Ro wrote: has patient seen PCP for this complaint? Yes. Pt has new Consulting civil engineer and needs PCA due to difficulties walking and standing. The phone number to PCA [phone]

## 2020-11-03 ENCOUNTER — Telehealth: Payer: Self-pay

## 2020-11-03 NOTE — Telephone Encounter (Signed)
Call placed to [facility] and [employer] # [phone].  Spoke to [employer] who took message to have representative call this CM back about referral process for personal care attendant.  She noted that this CM should receive a call back in 24-48 hours.   Call placed to patient and her mother, Erika Boyer, was with her. Erika Boyer said that [facility] already has PCS but with the insurance change, [employer] needs a referral because they need to be billed for the services going forward, not [employer].  Explained to Erika Boyer that this CM called [employer] and is waiting for a call back with information about the referral process.  Erika Boyer said she would also call [employer] to inquire about what is needed for the referral and their fax number.

## 2020-11-04 NOTE — Telephone Encounter (Signed)
Pt is calling back for Erika Boyer.  [phone] -Fax  Pt states all they need name, DOD, and [employer] id # - [id number]  Can write write it out any way possible but need a reason due to her balance. CB- [phone]

## 2020-11-08 NOTE — Telephone Encounter (Signed)
Call returned to patient to clarify order needed message left with call back requested to this CM

## 2020-11-09 NOTE — Telephone Encounter (Signed)
Call received from patient's mother, Jamesetta So.  She explained that the insurance company is requesting documentation/ letter from provider describing patient's need for personal care assistance due to her medical conditions.  Her mother noted that her balance and gait problems contribute to her need for assistance.   The letter can then be faxed to [facility].  Informed her that Dr Delford Field would be notified of this request.

## 2020-11-10 ENCOUNTER — Encounter: Payer: Self-pay | Admitting: Critical Care Medicine

## 2020-11-10 ENCOUNTER — Telehealth: Payer: Self-pay

## 2020-11-10 NOTE — Telephone Encounter (Signed)
Letter from Dr Delford Field supporting needs for [facility] faxed to [facility]

## 2020-11-10 NOTE — Telephone Encounter (Signed)
Letter is produced and put on your desk

## 2020-11-18 DIAGNOSIS — Z113 Encounter for screening for infections with a predominantly sexual mode of transmission: Secondary | ICD-10-CM | POA: Diagnosis not present

## 2020-11-18 DIAGNOSIS — Z3202 Encounter for pregnancy test, result negative: Secondary | ICD-10-CM | POA: Diagnosis not present

## 2020-11-18 DIAGNOSIS — N898 Other specified noninflammatory disorders of vagina: Secondary | ICD-10-CM | POA: Diagnosis not present

## 2020-11-18 DIAGNOSIS — N760 Acute vaginitis: Secondary | ICD-10-CM | POA: Diagnosis not present

## 2020-12-20 ENCOUNTER — Other Ambulatory Visit: Payer: Self-pay

## 2020-12-20 ENCOUNTER — Telehealth (INDEPENDENT_AMBULATORY_CARE_PROVIDER_SITE_OTHER): Payer: Self-pay

## 2020-12-20 ENCOUNTER — Encounter (HOSPITAL_COMMUNITY): Payer: Self-pay

## 2020-12-20 ENCOUNTER — Ambulatory Visit (HOSPITAL_COMMUNITY)
Admission: EM | Admit: 2020-12-20 | Discharge: 2020-12-20 | Disposition: A | Discharge status: Home / Self Care | Payer: Medicare HMO | Attending: Emergency Medicine | Admitting: Emergency Medicine

## 2020-12-20 DIAGNOSIS — R300 Dysuria: Secondary | ICD-10-CM | POA: Insufficient documentation

## 2020-12-20 DIAGNOSIS — G119 Hereditary ataxia, unspecified: Secondary | ICD-10-CM

## 2020-12-20 DIAGNOSIS — G319 Degenerative disease of nervous system, unspecified: Secondary | ICD-10-CM

## 2020-12-20 LAB — POCT URINALYSIS DIPSTICK, ED / UC
Glucose, UA: NEGATIVE mg/dL
Ketones, ur: NEGATIVE mg/dL
Leukocytes,Ua: NEGATIVE
Nitrite: NEGATIVE
Protein, ur: 30 mg/dL — AB
Specific Gravity, Urine: >=1.03 (ref 1.005–1.030)
Urobilinogen, UA: 0.2 mg/dL (ref 0.0–1.0)
pH: 5.5 (ref 5.0–8.0)

## 2020-12-20 MED ORDER — PHENAZOPYRIDINE HCL 200 MG PO TABS: 200.0000 mg | ORAL_TABLET | Freq: Three times a day (TID) | ORAL | 0 refills | Status: DC | PRN

## 2020-12-20 NOTE — Telephone Encounter (Signed)
Order for extra large size walker placed as dme ordere

## 2020-12-20 NOTE — Addendum Note (Signed)
Addended by: Storm Frisk on: 12/20/2020 04:31 PM   Modules accepted: Orders

## 2020-12-20 NOTE — Telephone Encounter (Signed)
Copied from CRM [id number]. Topic: General - Inquiry >> Dec 20, 2020  3:25 PM Erika Boyer D wrote: Reason for CRM: pt called asking if Dr. Delford Field could write an order for her to have a new walker  extra lg size.  Hers is broken.  CB#  [phone]

## 2020-12-20 NOTE — ED Provider Notes (Signed)
HPI  SUBJECTIVE:  Erika Boyer is a 25 y.o. female who presents with 2 weeks of urinary urgency, frequency, odorous urine and vaginal bleeding.  No dysuria, cloudy urine, hematuria, vaginal odor, discharge, genital rash, vulvar itching, vomiting, fevers, abdominal, back, pelvic pain.  She reports some nausea.  No recent antibiotics.  No antipyretic in the past 6 hours.  She is in a long-term monogamous relationship with a female, who is asymptomatic.  STDs are not a concern today.  No aggravating or alleviating factors.  She has not tried anything for this.  She has a past medical history of lupus, hypertension, cerebellar degeneration, UTI, BV, yeast vaginitis, remote history of gonorrhea and chlamydia.  No history of pyelonephritis, nephrolithiasis, HSV, HIV, syphilis, trichomonas.  LMP: Now.  Denies possibility being pregnant.  HBZ:JIRCVE, Charlcie Cradle, MD  Of note, she was treated for BV last month on 7/1.   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) test positive 03/20/2017   Pap q year   Chronic bronchitis (HCC)    Daily headache    Depression    Graves disease 2012   hx; "was on RX; it disappeared" (10/11/2016)   History of blood transfusion    "related to the anemia" (10/11/2016)   Hypertension    Lupus (HCC)    "not sure what kind" (10/11/2016)   Microcytic anemia 10/03/2016   Pneumonia 10/03/2016    Past Surgical History:  Procedure Laterality Date   PERICARDIOCENTESIS N/A 10/04/2016   Procedure: Pericardiocentesis;  Surgeon: Yates Decamp, MD;  Location: [facility] INVASIVE CV LAB;  Service: Cardiovascular;  Laterality: N/A;   REDUCTION MAMMAPLASTY Bilateral 2012   TONSILLECTOMY      Family History  Problem Relation Age of Onset   Thalassemia Mother    Arthritis Mother    Breast cancer Mother        Mastectomy in early 15's.     High blood  pressure Father    Mental illness Father        question bipolar disorder per wife   Sickle cell trait Maternal Aunt    Breast cancer Maternal Aunt        4 maternal aunts w/breast cancer   Breast cancer Maternal Grandmother        Bone   Multiple sclerosis Cousin    Diabetes Paternal Grandfather     Social History   Tobacco Use   Smoking status: Never   Smokeless tobacco: Never  Vaping Use   Vaping Use: Never used  Substance Use Topics   Alcohol use: No   Drug use: No    No current facility-administered medications for this encounter.  Current Outpatient Medications:    Multiple Vitamin (MULTIVITAMIN) tablet, Take 1 tablet by mouth daily., Disp: , Rfl:    phenazopyridine (PYRIDIUM) 200 MG tablet, Take 1 tablet (200 mg total) by mouth 3 (three) times daily as needed for pain., Disp: 6 tablet, Rfl: 0   acetaminophen (TYLENOL) 500 MG tablet, Take 1 tablet (500 mg total) by mouth every 6 (six) hours as needed., Disp: 30 tablet, Rfl: 0   albuterol (VENTOLIN HFA) 108 (90 Base) MCG/ACT inhaler, Inhale 2 puffs into the lungs every 4 (four) hours as needed for wheezing or shortness of breath., Disp: 18 g, Rfl: 0   ferrous sulfate 325 (65 FE) MG tablet,  Take 1 tablet (325 mg total) by mouth daily with breakfast., Disp: 30 tablet, Rfl: 0   hydroxychloroquine (PLAQUENIL) 200 MG tablet, TK 2 TS PO QD WF OR MILK, Disp: , Rfl: 2   ibuprofen (ADVIL) 600 MG tablet, Take 1 tablet (600 mg total) by mouth every 6 (six) hours., Disp: 30 tablet, Rfl: 0   predniSONE (DELTASONE) 1 MG tablet, Take 1 mg by mouth daily with breakfast., Disp: , Rfl:    prenatal vitamin w/FE, FA (PRENATAL 1 + 1) 27-1 MG TABS tablet, Take 1 tablet by mouth daily at 12 noon. (Patient not taking: No sig reported), Disp: 30 tablet, Rfl: 12  Allergies  Allergen Reactions   Shellfish Allergy Anaphylaxis   Morphine And Related Itching   Mushroom Extract Complex Other (See Comments)    Mother is allergic, so patient avoids  these   Tapazole [Methimazole] Swelling and Rash    WELTS (also) Mother suspects this MAY be an allergy, as the onset of symptoms coincided with this being started     ROS  As noted in HPI.   Physical Exam  BP 111/74 (BP Location: Right Wrist)   Pulse 88   Temp 98.4 F (36.9 C) (Oral)   Resp 18   LMP  (Within Days)   SpO2 100%   Constitutional: Well developed, well nourished, no acute distress Eyes:  EOMI, conjunctiva normal bilaterally HENT: Normocephalic, atraumatic,mucus membranes moist Respiratory: Normal inspiratory effort Cardiovascular: Normal rate GI: nondistended.  Soft.  No suprapubic, flank tenderness. Back: No CVAT skin: No rash, skin intact Musculoskeletal: no deformities Neurologic: Alert & oriented x 3, no focal neuro deficits Psychiatric: Speech and behavior appropriate   ED Course   Medications - No data to display  Orders Placed This Encounter  Procedures   POCT Urinalysis Dipstick (ED/UC)    Standing Status:   Standing    Number of Occurrences:   1    Results for orders placed or performed during the hospital encounter of 12/20/20 (from the past 24 hour(s))  POCT Urinalysis Dipstick (ED/UC)     Status: Abnormal   Collection Time: 12/20/20  3:24 PM  Result Value Ref Range   Glucose, UA NEGATIVE NEGATIVE mg/dL   Bilirubin Urine SMALL (A) NEGATIVE   Ketones, ur NEGATIVE NEGATIVE mg/dL   Specific Gravity, Urine >=1.030 1.005 - 1.030   Hgb urine dipstick LARGE (A) NEGATIVE   pH 5.5 5.0 - 8.0   Protein, ur 30 (A) NEGATIVE mg/dL   Urobilinogen, UA 0.2 0.0 - 1.0 mg/dL   Nitrite NEGATIVE NEGATIVE   Leukocytes,Ua NEGATIVE NEGATIVE   No results found.  ED Clinical Impression  1. Dysuria      ED Assessment/Plan  UA negative for UTI.  She has proteinuria and hematuria, but she is also currently on menses.  Advised her to increase her fluid intake.  Gynecologic infection, such as BV could also be causing her symptoms.   Vaginal swab sent for  gonorrhea, chlamydia, trichomonas, BV, yeast.  In the meantime, will send home with Pyridium for her symptoms and  We will treat based on labs.  Follow-up with PMD in several days if not getting any better.  ER return precautions given  Recent BUN/creatinine normal.   Discussed labs, imaging, MDM, treatment plan, and plan for follow-up with patient. Discussed sn/sx that should prompt return to the ED. patient agrees with plan.   Meds ordered this encounter  Medications   phenazopyridine (PYRIDIUM) 200 MG tablet    Sig:  Take 1 tablet (200 mg total) by mouth 3 (three) times daily as needed for pain.    Dispense:  6 tablet    Refill:  0      *This clinic note was created using Scientist, clinical (histocompatibility and immunogenetics). Therefore, there may be occasional mistakes despite careful proofreading.  ?    Domenick Gong, MD 12/22/20 [phone]

## 2020-12-20 NOTE — Discharge Instructions (Addendum)
Urinalysis was negative for urinary tract infection.  I have sent off a swab to look for BV and other vaginal infections.  We will contact you and treat you based on those labs.  Pyridium will turn your urine orange, but will help your symptoms.  Make sure you drink plenty of extra fluids.  Some of your symptoms can be caused by dehydration/concentrated urine.

## 2020-12-20 NOTE — Telephone Encounter (Signed)
Order will be faxed to [facility]. 

## 2020-12-20 NOTE — ED Triage Notes (Signed)
Pt reports increased urinary frequency x 2 weeks. Pt reports she can not hold the urine.

## 2020-12-21 ENCOUNTER — Telehealth (HOSPITAL_COMMUNITY): Payer: Self-pay | Admitting: Emergency Medicine

## 2020-12-21 LAB — CERVICOVAGINAL ANCILLARY ONLY
Bacterial Vaginitis (gardnerella): POSITIVE — AB
Candida Glabrata: NEGATIVE
Candida Vaginitis: NEGATIVE
Chlamydia: NEGATIVE
Comment: NEGATIVE
Comment: NEGATIVE
Comment: NEGATIVE
Comment: NEGATIVE
Comment: NEGATIVE
Comment: NORMAL
Neisseria Gonorrhea: NEGATIVE
Trichomonas: NEGATIVE

## 2020-12-21 MED ORDER — METRONIDAZOLE 500 MG PO TABS: 500.0000 mg | ORAL_TABLET | Freq: Two times a day (BID) | ORAL | 0 refills | Status: DC

## 2021-01-15 ENCOUNTER — Telehealth: Payer: Self-pay

## 2021-01-15 NOTE — Telephone Encounter (Signed)
Called pt to schedule AWV, no answer, left vm to call back. 

## 2021-01-19 DIAGNOSIS — M2550 Pain in unspecified joint: Secondary | ICD-10-CM | POA: Diagnosis not present

## 2021-01-19 DIAGNOSIS — M329 Systemic lupus erythematosus, unspecified: Secondary | ICD-10-CM | POA: Diagnosis not present

## 2021-01-19 DIAGNOSIS — J90 Pleural effusion, not elsewhere classified: Secondary | ICD-10-CM | POA: Diagnosis not present

## 2021-01-19 DIAGNOSIS — I313 Pericardial effusion (noninflammatory): Secondary | ICD-10-CM | POA: Diagnosis not present

## 2021-01-19 DIAGNOSIS — Z6841 Body Mass Index (BMI) 40.0 and over, adult: Secondary | ICD-10-CM | POA: Diagnosis not present

## 2021-01-19 DIAGNOSIS — Z79899 Other long term (current) drug therapy: Secondary | ICD-10-CM | POA: Diagnosis not present

## 2021-02-02 ENCOUNTER — Ambulatory Visit (HOSPITAL_BASED_OUTPATIENT_CLINIC_OR_DEPARTMENT_OTHER): Payer: Medicare HMO

## 2021-02-02 DIAGNOSIS — Z0000 Encounter for general adult medical examination without abnormal findings: Secondary | ICD-10-CM | POA: Diagnosis not present

## 2021-02-02 NOTE — Progress Notes (Signed)
Subjective:   Erika Boyer is a 25 y.o. female who presents for an Initial Medicare Annual Wellness Visit. I connected with  Erika Boyer on 02/02/21 by a audio enabled telemedicine application and verified that I am speaking with the correct person using two identifiers.   I discussed the limitations of evaluation and management by telemedicine. The patient expressed understanding and agreed to proceed.   Location of patient: Home Location of provider: Office  Persons participating in visit: Erika Boyer (patient) and Drenda Freeze, Kathleen  Review of Systems    Defer to PCP       Objective:    There were no vitals filed for this visit. There is no height or weight on file to calculate BMI.  Advanced Directives 02/02/2021 03/18/2020 12/02/2019 03/19/2019 03/12/2017 02/05/2017 02/04/2017  Does Patient Have a Medical Advance Directive? _0  No No  Would patient like information on creating a medical advance directive? No - Patient declined No - Patient declined No - Patient declined No - Patient declined No - Patient declined No - Patient declined -    Current Medications (verified) Outpatient Encounter Medications as of 02/02/2021  Medication Sig   acetaminophen (TYLENOL) 500 MG tablet Take 1 tablet (500 mg total) by mouth every 6 (six) hours as needed.   albuterol (VENTOLIN HFA) 108 (90 Base) MCG/ACT inhaler Inhale 2 puffs into the lungs every 4 (four) hours as needed for wheezing or shortness of breath.   hydroxychloroquine (PLAQUENIL) 200 MG tablet TK 2 TS PO QD WF OR MILK   ibuprofen (ADVIL) 600 MG tablet Take 1 tablet (600 mg total) by mouth every 6 (six) hours.   metroNIDAZOLE (FLAGYL) 500 MG tablet Take 1 tablet (500 mg total) by mouth 2 (two) times daily.   Multiple Vitamin (MULTIVITAMIN) tablet Take 1 tablet by mouth daily.   phenazopyridine (PYRIDIUM) 200 MG tablet Take 1 tablet (200 mg total) by mouth 3 (three) times daily as needed for pain.   predniSONE  (DELTASONE) 1 MG tablet Take 1 mg by mouth daily with breakfast.   ferrous sulfate 325 (65 FE) MG tablet Take 1 tablet (325 mg total) by mouth daily with breakfast.   [DISCONTINUED] prenatal vitamin w/FE, FA (PRENATAL 1 + 1) 27-1 MG TABS tablet Take 1 tablet by mouth daily at 12 noon. (Patient not taking: No sig reported)   No facility-administered encounter medications on file as of 02/02/2021.    Allergies (verified) Shellfish allergy, Morphine and related, Mushroom extract complex, and Tapazole [methimazole]   History: 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) test positive 03/20/2017   Pap q year   Chronic bronchitis ([place])    Daily headache    Depression    Graves disease 2012   hx; "was on RX; it disappeared" (10/11/2016)   History of blood transfusion    "related to the anemia" (10/11/2016)   Hypertension    Lupus ([place])    "not sure what kind" (10/11/2016)   Microcytic anemia 10/03/2016   Pneumonia 10/03/2016   Past Surgical History:  Procedure Laterality Date   PERICARDIOCENTESIS N/A 10/04/2016   Procedure: Pericardiocentesis;  Surgeon: Adrian Prows, MD;  Location: [facility];  Service: Cardiovascular;  Laterality: N/A;   REDUCTION MAMMAPLASTY Bilateral 2012   TONSILLECTOMY     Family History  Problem Relation Age of Onset   Thalassemia Mother    Arthritis Mother    Breast cancer Mother        Mastectomy in early 32's.     High blood pressure Father    Mental illness Father        question bipolar disorder per wife   Sickle cell trait Maternal Aunt    Breast cancer Maternal Aunt        4 maternal aunts w/breast cancer   Breast cancer Maternal Grandmother        Bone   Multiple sclerosis Cousin    Diabetes Paternal Grandfather    Social History   Socioeconomic History   Marital status: Single    Spouse name: Not on  file   Number of children: 0   Years of education: 14   Highest education level: Some college, no degree  Occupational History   Not on file  Tobacco Use   Smoking status: Never   Smokeless tobacco: Never  Vaping Use   Vaping Use: Never used  Substance and Sexual Activity   Alcohol use: No   Drug use: No   Sexual activity: Yes    Birth control/protection: None  Other Topics Concern   Not on file  Social History Narrative   Lives at home w/ her mom   Right-handed   Caffeine: occasional coffee   Has home care pca for adl's   Social Determinants of Health   Financial Resource Strain: Low Risk    Difficulty of Paying Living Expenses: Not hard at all  Food Insecurity: Food Insecurity Present   Worried About Charity fundraiser in the Last Year: Often true   Arboriculturist in the Last Year: Never true  Transportation Needs: No Transportation Needs   Lack of Transportation (Medical): No   Lack of Transportation (Non-Medical): No  Physical Activity: Inactive   Days of Exercise per Week: 0 days   Minutes of Exercise per Session: 0 min  Stress: No Stress Concern Present   Feeling of Stress : Not at all  Social Connections: Moderately Isolated   Frequency of Communication with Friends and Family: More than three times a week   Frequency of Social Gatherings with Friends and Family: More than three times a week   Attends Religious Services: 1 to 4 times per year   Active Member of [employer] or Organizations: No   Attends Music therapist: Never   Marital Status: Never married    Tobacco Counseling Counseling given: Not Answered   Clinical Intake:  Pre-visit preparation completed: Yes  Pain : No/denies pain     Diabetes: No  How often do you need to have someone help you when you read instructions, pamphlets, or other written materials from your doctor or pharmacy?: 2 - Rarely What is the last grade level you completed in school?:  college  Diabetic?No  Interpreter Needed?: No  Information entered by :: Drenda Freeze, [place] of Daily Living In your present state of health, do you have any difficulty performing the following activities: 02/02/2021 03/18/2020  Hearing? N N  Vision? N N  Difficulty concentrating or making decisions? N N  Walking or climbing stairs? N Y  Dressing or bathing? N Y  Doing errands, shopping? N Y  Conservation officer, nature and eating ? N -  Using the Toilet? N -  In the past six months, have you accidently leaked urine? Y -  Do you  have problems with loss of bowel control? N -  Managing your Medications? N -  Managing your Finances? N -  Housekeeping or managing your Housekeeping? N -  Some recent data might be hidden    Patient Care Team: Elsie Stain, MD as PCP - General (Pulmonary Disease) Buford Dresser, MD as PCP - Cardiology (Cardiology)  Indicate any recent Medical Services you may have received from other than [facility] providers in the past year (date may be approximate).     Assessment:   This is a routine wellness examination for Erika Boyer.  Hearing/Vision screen No results found.  Dietary issues and exercise activities discussed:     Goals Addressed   None   Depression Screen PHQ 2/9 Scores 02/02/2021 10/20/2020 06/22/2020 05/12/2020 04/13/2020 03/25/2020 02/12/2020  PHQ - 2 Score 4 0 _0 0  PHQ- 9 Score 11 0 _1 Fall Risk Fall Risk  02/02/2021 05/12/2020 08/05/2018 02/04/2017  Falls in the past year? 0 0 0 No  Number falls in past yr: 0 0 - -  Injury with Fall? 0 0 - -  Risk for fall due to : No Fall Risks Impaired mobility - -  Follow up Falls evaluation completed Falls evaluation completed - -    FALL RISK PREVENTION PERTAINING TO THE HOME:  Any stairs in or around the home? No  If so, are there any without handrails? No  Home free of loose throw rugs in walkways, pet beds, electrical cords, etc? Yes  Adequate lighting in your home  to reduce risk of falls? No   ASSISTIVE DEVICES UTILIZED TO PREVENT FALLS:  Life alert? No  Use of a cane, walker or w/c? Yes  Grab bars in the bathroom? Yes  Shower chair or bench in shower? Yes  Elevated toilet seat or a handicapped toilet? No   TIMED UP AND GO:  Was the test performed?  N/A .  Length of time to ambulate 10 feet: N/A sec.     Cognitive Function:     6CIT Screen 02/02/2021  What time? 0 points  Count back from 20 0 points  Months in reverse 0 points  Repeat phrase 10 points    Immunizations Immunization History  Administered Date(s) Administered   DTP 04/09/1996   DTaP 02/07/1996, 05/27/1996, 06/04/1997, 12/17/2000   HPV 9-valent 10/20/2020   Hepatitis A, Ped/Adol-2 Dose 10/17/2012, 04/20/2013   Hepatitis B, ped/adol 08-28-1995, 02/07/1996, 08/12/1996   HiB (PRP-T) 02/07/1996, 04/09/1996, 05/27/1996, 12/14/1996   Influenza-Unspecified 05/01/2008, 02/18/2018   MMR 12/14/1996, 12/17/2000   Meningococcal Conjugate 09/19/2009, 01/11/2014   Moderna Sars-Covid-2 Vaccination 08/27/2019, 09/23/2019   Pneumococcal Conjugate-13 02/07/1996, 04/09/1996, 12/14/1996, 12/17/2000   Pneumococcal Polysaccharide-23 10/07/2016   Tdap 09/09/2007, 03/25/2018   Varicella 02/24/1997, 09/09/2007    TDAP status: Up to date  Flu Vaccine status: Due, Education has been provided regarding the importance of this vaccine. Advised may receive this vaccine at local pharmacy or Health Dept. Aware to provide a copy of the vaccination record if obtained from local pharmacy or Health Dept. Verbalized acceptance and understanding.  Pneumococcal vaccine status: Up to date  Covid-19 vaccine status: Completed vaccines  Qualifies for Shingles Vaccine? No   Zostavax completed No   Shingrix Completed?: No.    Education has been provided regarding the importance of this vaccine. Patient has been advised to call insurance company to determine out of pocket expense if they have not yet  received this vaccine. Advised  may also receive vaccine at local pharmacy or Health Dept. Verbalized acceptance and understanding.  Screening Tests Health Maintenance  Topic Date Due   COVID-19 Vaccine (3 - Moderna risk series) 10/21/2019   HPV VACCINES (2 - 3-dose series) 11/17/2020   INFLUENZA VACCINE  12/19/2020   Pneumococcal Vaccine 83-38 Years old (2 - PPSV23 or PCV20) 10/07/2021   PAP-Cervical Cytology Screening  08/06/2022   PAP SMEAR-Modifier  08/06/2022   TETANUS/TDAP  03/25/2028   Hepatitis C Screening  Completed   HIV Screening  Completed    Health Maintenance  Health Maintenance Due  Topic Date Due   COVID-19 Vaccine (3 - Moderna risk series) 10/21/2019   HPV VACCINES (2 - 3-dose series) 11/17/2020   INFLUENZA VACCINE  12/19/2020     Lung Cancer Screening: (Low Dose CT Chest recommended if Age 33-80 years, 30 pack-year currently smoking OR have quit w/in 15years.) does not qualify.   Lung Cancer Screening Referral: No  Additional Screening:  Hepatitis C Screening: does qualify; Completed 11/25/19  Vision Screening: Recommended annual ophthalmology exams for early detection of glaucoma and other disorders of the eye. Is the patient up to date with their annual eye exam?   Pt does not wear glasses or contacts. Who is the provider or what is the name of the office in which the patient attends annual eye exams? N/A If pt is not established with a provider, would they like to be referred to a provider to establish care? No .   Dental Screening: Recommended annual dental exams for proper oral hygiene  Community Resource Referral / Chronic Care Management: CRR required this visit?  No   CCM required this visit?  No      Plan:     I have personally reviewed and noted the following in the patient's chart:   Medical and social history Use of alcohol, tobacco or illicit drugs  Current medications and supplements including opioid prescriptions. Patient is not  currently taking opioid prescriptions. Functional ability and status Nutritional status Physical activity Advanced directives List of other physicians Hospitalizations, surgeries, and ER visits in previous 12 months Vitals Screenings to include cognitive, depression, and falls Referrals and appointments  In addition, I have reviewed and discussed with patient certain preventive protocols, quality metrics, and best practice recommendations. A written personalized care plan for preventive services as well as general preventive health recommendations were provided to patient.     Drenda Freeze, [facility]   02/02/2021   Nurse Notes: Non-Face to Face 60 minute visit Encounter.  Ms. Peterkin , Thank you for taking time to come for your Medicare Wellness Visit. I appreciate your ongoing commitment to your health goals. Please review the following plan we discussed and let me know if I can assist you in the future.   These are the goals we discussed:  Goals   None     This is a list of the screening recommended for you and due dates:  Health Maintenance  Topic Date Due   COVID-19 Vaccine (3 - Moderna risk series) 10/21/2019   HPV Vaccine (2 - 3-dose series) 11/17/2020   Flu Shot  12/19/2020   Pneumococcal Vaccination (2 - PPSV23 or PCV20) 10/07/2021   Pap Smear  08/06/2022   Pap Smear  08/06/2022   Tetanus Vaccine  03/25/2028   Hepatitis C Screening: USPSTF Recommendation to screen - Ages 18-79 yo.  Completed   HIV Screening  Completed

## 2021-02-02 NOTE — Patient Instructions (Signed)
Health Maintenance, Female Adopting a healthy lifestyle and getting preventive care are important in promoting health and wellness. Ask your health care provider about: The right schedule for you to have regular tests and exams. Things you can do on your own to prevent diseases and keep yourself healthy. What should I know about diet, weight, and exercise? Eat a healthy diet  Eat a diet that includes plenty of vegetables, fruits, low-fat dairy products, and lean protein. Do not eat a lot of foods that are high in solid fats, added sugars, or sodium. Maintain a healthy weight Body mass index (BMI) is used to identify weight problems. It estimates body fat based on height and weight. Your health care provider can help determine your BMI and help you achieve or maintain a healthy weight. Get regular exercise Get regular exercise. This is one of the most important things you can do for your health. Most adults should: Exercise for at least 150 minutes each week. The exercise should increase your heart rate and make you sweat (moderate-intensity exercise). Do strengthening exercises at least twice a week. This is in addition to the moderate-intensity exercise. Spend less time sitting. Even light physical activity can be beneficial. Watch cholesterol and blood lipids Have your blood tested for lipids and cholesterol at 25 years of age, then have this test every 5 years. Have your cholesterol levels checked more often if: Your lipid or cholesterol levels are high. You are older than 25 years of age. You are at high risk for heart disease. What should I know about cancer screening? Depending on your health history and family history, you may need to have cancer screening at various ages. This may include screening for: Breast cancer. Cervical cancer. Colorectal cancer. Skin cancer. Lung cancer. What should I know about heart disease, diabetes, and high blood pressure? Blood pressure and heart  disease High blood pressure causes heart disease and increases the risk of stroke. This is more likely to develop in people who have high blood pressure readings, are of African descent, or are overweight. Have your blood pressure checked: Every 3-5 years if you are 18-39 years of age. Every year if you are 40 years old or older. Diabetes Have regular diabetes screenings. This checks your fasting blood sugar level. Have the screening done: Once every three years after age 40 if you are at a normal weight and have a low risk for diabetes. More often and at a younger age if you are overweight or have a high risk for diabetes. What should I know about preventing infection? Hepatitis B If you have a higher risk for hepatitis B, you should be screened for this virus. Talk with your health care provider to find out if you are at risk for hepatitis B infection. Hepatitis C Testing is recommended for: Everyone born from 1945 through 1965. Anyone with known risk factors for hepatitis C. Sexually transmitted infections (STIs) Get screened for STIs, including gonorrhea and chlamydia, if: You are sexually active and are younger than 24 years of age. You are older than 24 years of age and your health care provider tells you that you are at risk for this type of infection. Your sexual activity has changed since you were last screened, and you are at increased risk for chlamydia or gonorrhea. Ask your health care provider if you are at risk. Ask your health care provider about whether you are at high risk for HIV. Your health care provider may recommend a prescription medicine   to help prevent HIV infection. If you choose to take medicine to prevent HIV, you should first get tested for HIV. You should then be tested every 3 months for as long as you are taking the medicine. Pregnancy If you are about to stop having your period (premenopausal) and you may become pregnant, seek counseling before you get  pregnant. Take 400 to 800 micrograms (mcg) of folic acid every day if you become pregnant. Ask for birth control (contraception) if you want to prevent pregnancy. Osteoporosis and menopause Osteoporosis is a disease in which the bones lose minerals and strength with aging. This can result in bone fractures. If you are 65 years old or older, or if you are at risk for osteoporosis and fractures, ask your health care provider if you should: Be screened for bone loss. Take a calcium or vitamin D supplement to lower your risk of fractures. Be given hormone replacement therapy (HRT) to treat symptoms of menopause. Follow these instructions at home: Lifestyle Do not use any products that contain nicotine or tobacco, such as cigarettes, e-cigarettes, and chewing tobacco. If you need help quitting, ask your health care provider. Do not use street drugs. Do not share needles. Ask your health care provider for help if you need support or information about quitting drugs. Alcohol use Do not drink alcohol if: Your health care provider tells you not to drink. You are pregnant, may be pregnant, or are planning to become pregnant. If you drink alcohol: Limit how much you use to 0-1 drink a day. Limit intake if you are breastfeeding. Be aware of how much alcohol is in your drink. In the [place] one drink equals one 12 oz bottle of beer (355 mL), one 5 oz glass of wine (148 mL), or one 1 oz glass of hard liquor (44 mL). General instructions Schedule regular health, dental, and eye exams. Stay current with your vaccines. Tell your health care provider if: You often feel depressed. You have ever been abused or do not feel safe at home. Summary Adopting a healthy lifestyle and getting preventive care are important in promoting health and wellness. Follow your health care provider's instructions about healthy diet, exercising, and getting tested or screened for diseases. Follow your health care provider's  instructions on monitoring your cholesterol and blood pressure. This information is not intended to replace advice given to you by your health care provider. Make sure you discuss any questions you have with your health care provider. Document Revised: 07/15/2020 Document Reviewed: 04/30/2018 Elsevier Patient Education  2022 Elsevier Inc.  

## 2021-02-06 ENCOUNTER — Ambulatory Visit: Payer: Medicare Other | Admitting: Family Medicine

## 2021-02-06 ENCOUNTER — Telehealth: Payer: Self-pay | Admitting: Family Medicine

## 2021-02-06 NOTE — Telephone Encounter (Signed)
Patient  has an upcoming apt in Jan 2023 but needs an order for a new walker as the one she has is broken. Best call back is [phone]

## 2021-02-06 NOTE — Telephone Encounter (Signed)
Erika Boyer- patient last seen 02/04/20. Are you ok with providing? Or does she need to be fit in sooner or request from PCP?

## 2021-02-06 NOTE — Telephone Encounter (Signed)
Called and LVM for pt relaying AL,NP note. Advised her to call back if she has any more questions.

## 2021-02-21 ENCOUNTER — Ambulatory Visit (INDEPENDENT_AMBULATORY_CARE_PROVIDER_SITE_OTHER): Payer: Medicare HMO

## 2021-02-21 ENCOUNTER — Other Ambulatory Visit: Payer: Self-pay

## 2021-02-21 VITALS — BP 123/90 | HR 87 | Wt 339.3 lb

## 2021-02-21 DIAGNOSIS — Z3202 Encounter for pregnancy test, result negative: Secondary | ICD-10-CM

## 2021-02-21 LAB — POCT PREGNANCY, URINE: Preg Test, Ur: NEGATIVE

## 2021-02-21 NOTE — Progress Notes (Signed)
Pt dropped off urine today for UPT. UPT is negative.  Pt states took home UPT and was invalid. Pt given results from office today.   Pt denies any vaginal bleeding, abd pain or cramps at this time. Pt advised if feels like she is pregnant, then to retest in 2 weeks. Pt states has irregular cycles and had unprotected sex a couple of weeks ago and has not started cycle. LMP 8/28 approximately. States did not have cycle in Sept.   Judeth Cornfield, Charity fundraiser

## 2021-02-22 NOTE — Progress Notes (Signed)
I have reviewed the chart and agree with the nurse's note and plan of care for this visit.   Sharen Counter, CNM 1:44 PM

## 2021-03-03 DIAGNOSIS — H5213 Myopia, bilateral: Secondary | ICD-10-CM | POA: Diagnosis not present

## 2021-03-03 DIAGNOSIS — H52209 Unspecified astigmatism, unspecified eye: Secondary | ICD-10-CM | POA: Diagnosis not present

## 2021-03-07 ENCOUNTER — Other Ambulatory Visit: Payer: Self-pay

## 2021-03-07 ENCOUNTER — Encounter: Payer: Self-pay | Admitting: Critical Care Medicine

## 2021-03-07 ENCOUNTER — Ambulatory Visit: Payer: Medicare HMO | Attending: Critical Care Medicine | Admitting: Critical Care Medicine

## 2021-03-07 DIAGNOSIS — Z113 Encounter for screening for infections with a predominantly sexual mode of transmission: Secondary | ICD-10-CM | POA: Diagnosis not present

## 2021-03-07 DIAGNOSIS — M329 Systemic lupus erythematosus, unspecified: Secondary | ICD-10-CM | POA: Diagnosis not present

## 2021-03-07 DIAGNOSIS — I3139 Other pericardial effusion (noninflammatory): Secondary | ICD-10-CM

## 2021-03-07 DIAGNOSIS — E2740 Unspecified adrenocortical insufficiency: Secondary | ICD-10-CM | POA: Insufficient documentation

## 2021-03-07 DIAGNOSIS — G119 Hereditary ataxia, unspecified: Secondary | ICD-10-CM

## 2021-03-07 DIAGNOSIS — G319 Degenerative disease of nervous system, unspecified: Secondary | ICD-10-CM | POA: Diagnosis not present

## 2021-03-07 DIAGNOSIS — Z8619 Personal history of other infectious and parasitic diseases: Secondary | ICD-10-CM

## 2021-03-07 DIAGNOSIS — R768 Other specified abnormal immunological findings in serum: Secondary | ICD-10-CM | POA: Insufficient documentation

## 2021-03-07 DIAGNOSIS — E0590 Thyrotoxicosis, unspecified without thyrotoxic crisis or storm: Secondary | ICD-10-CM

## 2021-03-07 DIAGNOSIS — R269 Unspecified abnormalities of gait and mobility: Secondary | ICD-10-CM

## 2021-03-07 DIAGNOSIS — M2550 Pain in unspecified joint: Secondary | ICD-10-CM | POA: Insufficient documentation

## 2021-03-07 DIAGNOSIS — Z79899 Other long term (current) drug therapy: Secondary | ICD-10-CM | POA: Insufficient documentation

## 2021-03-07 NOTE — Assessment & Plan Note (Signed)
Stable pericardial effusion continue low-dose prednisone per rheumatology and cardiology

## 2021-03-07 NOTE — Assessment & Plan Note (Signed)
Repeat STD screening

## 2021-03-07 NOTE — Assessment & Plan Note (Signed)
Stable thyroid function

## 2021-03-07 NOTE — Assessment & Plan Note (Signed)
Referral for bariatric surgery evaluation in place

## 2021-03-07 NOTE — Assessment & Plan Note (Signed)
New walker will be obtained

## 2021-03-07 NOTE — Assessment & Plan Note (Signed)
Cerebellar degeneration stable at this time we will get the patient a new walker

## 2021-03-07 NOTE — Assessment & Plan Note (Signed)
As per rheumatology 

## 2021-03-07 NOTE — Assessment & Plan Note (Signed)
As per cerebellar degeneration assessment
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
## 2021-03-09 ENCOUNTER — Telehealth: Payer: Self-pay

## 2021-03-09 DIAGNOSIS — R269 Unspecified abnormalities of gait and mobility: Secondary | ICD-10-CM

## 2021-03-09 DIAGNOSIS — G319 Degenerative disease of nervous system, unspecified: Secondary | ICD-10-CM

## 2021-03-09 DIAGNOSIS — G119 Hereditary ataxia, unspecified: Secondary | ICD-10-CM

## 2021-03-09 NOTE — Telephone Encounter (Signed)
Erika Boyer, putting order in for a shower chair

## 2021-03-09 NOTE — Telephone Encounter (Signed)
Opened in error

## 2021-03-09 NOTE — Telephone Encounter (Signed)
Orders received for shower chair and 4 wheeled RW with seat. Spoke to patient and she has no preference for DME companies and was in agreement to sending the orders to [employer].  Orders then faxed to [employer]

## 2021-03-10 ENCOUNTER — Ambulatory Visit: Payer: Medicare HMO | Attending: Critical Care Medicine

## 2021-03-10 ENCOUNTER — Other Ambulatory Visit: Payer: Self-pay

## 2021-03-10 DIAGNOSIS — Z113 Encounter for screening for infections with a predominantly sexual mode of transmission: Secondary | ICD-10-CM | POA: Diagnosis not present

## 2021-03-10 DIAGNOSIS — Z23 Encounter for immunization: Secondary | ICD-10-CM

## 2021-03-10 DIAGNOSIS — Z79899 Other long term (current) drug therapy: Secondary | ICD-10-CM | POA: Diagnosis not present

## 2021-03-11 LAB — HIV ANTIBODY (ROUTINE TESTING W REFLEX): HIV Screen 4th Generation wRfx: NONREACTIVE

## 2021-03-13 ENCOUNTER — Encounter: Payer: Self-pay | Admitting: Critical Care Medicine

## 2021-03-13 NOTE — Progress Notes (Signed)
Received eye exam from [facility] care from 03/10/21   no evidence for plaquenil eye damage.  Rescreen in ONE year

## 2021-03-13 NOTE — Telephone Encounter (Signed)
Erika Boyer  this pt was to have had a cervical vag swab when she came in for her HIV blood work,  this did not occur. Pls call the pt and arrange her to get the swab .   The order is in the system .

## 2021-03-14 ENCOUNTER — Other Ambulatory Visit: Payer: Self-pay | Admitting: Critical Care Medicine

## 2021-03-14 ENCOUNTER — Telehealth: Payer: Self-pay | Admitting: *Deleted

## 2021-03-14 DIAGNOSIS — N912 Amenorrhea, unspecified: Secondary | ICD-10-CM

## 2021-03-14 NOTE — Telephone Encounter (Signed)
Copied from CRM [id number]. Topic: General - Other >> Mar 14, 2021 12:56 PM Gwenlyn Fudge wrote: Reason for CRM: Pt called stating that she received a call from office. She states that there was no VM and no documentation on chart. Please advise.

## 2021-03-15 NOTE — Telephone Encounter (Signed)
Pt was called and a VM was left informing patient to return phone call to set up a nurse visit.

## 2021-03-17 DIAGNOSIS — R269 Unspecified abnormalities of gait and mobility: Secondary | ICD-10-CM | POA: Diagnosis not present

## 2021-03-22 DIAGNOSIS — Z6841 Body Mass Index (BMI) 40.0 and over, adult: Secondary | ICD-10-CM

## 2021-03-23 ENCOUNTER — Telehealth: Payer: Self-pay

## 2021-03-23 NOTE — Telephone Encounter (Signed)
Per [facility], patient's rollator and shower stool were delivered 03/17/2021.

## 2021-04-11 DIAGNOSIS — G319 Degenerative disease of nervous system, unspecified: Secondary | ICD-10-CM | POA: Diagnosis not present

## 2021-04-11 DIAGNOSIS — M329 Systemic lupus erythematosus, unspecified: Secondary | ICD-10-CM | POA: Diagnosis not present

## 2021-04-12 ENCOUNTER — Other Ambulatory Visit: Payer: Self-pay | Admitting: Surgery

## 2021-04-12 ENCOUNTER — Other Ambulatory Visit (HOSPITAL_COMMUNITY): Payer: Self-pay | Admitting: Surgery

## 2021-04-17 ENCOUNTER — Ambulatory Visit: Payer: Medicare HMO | Admitting: Critical Care Medicine
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
## 2021-04-27 ENCOUNTER — Other Ambulatory Visit (HOSPITAL_COMMUNITY): Payer: Self-pay | Admitting: Surgery

## 2021-04-27 ENCOUNTER — Ambulatory Visit (HOSPITAL_COMMUNITY)
Admission: RE | Admit: 2021-04-27 | Discharge: 2021-04-27 | Disposition: A | Discharge status: Home / Self Care | Payer: Medicare HMO | Source: Ambulatory Visit | Attending: Surgery | Admitting: Surgery

## 2021-04-27 DIAGNOSIS — Z01818 Encounter for other preprocedural examination: Secondary | ICD-10-CM | POA: Diagnosis not present

## 2021-05-28 ENCOUNTER — Other Ambulatory Visit: Payer: Self-pay

## 2021-05-28 ENCOUNTER — Ambulatory Visit (HOSPITAL_COMMUNITY)
Admission: EM | Admit: 2021-05-28 | Discharge: 2021-05-28 | Disposition: A | Discharge status: Home / Self Care | Payer: Medicare HMO | Attending: Physician Assistant | Admitting: Physician Assistant

## 2021-05-28 ENCOUNTER — Encounter (HOSPITAL_COMMUNITY): Payer: Self-pay | Admitting: Emergency Medicine

## 2021-05-28 DIAGNOSIS — N760 Acute vaginitis: Secondary | ICD-10-CM | POA: Insufficient documentation

## 2021-05-28 DIAGNOSIS — B9689 Other specified bacterial agents as the cause of diseases classified elsewhere: Secondary | ICD-10-CM | POA: Insufficient documentation

## 2021-05-28 DIAGNOSIS — S21001A Unspecified open wound of right breast, initial encounter: Secondary | ICD-10-CM | POA: Insufficient documentation

## 2021-05-28 LAB — POCT URINALYSIS DIPSTICK, ED / UC
Bilirubin Urine: NEGATIVE
Glucose, UA: NEGATIVE mg/dL
Ketones, ur: NEGATIVE mg/dL
Nitrite: NEGATIVE
Protein, ur: NEGATIVE mg/dL
Specific Gravity, Urine: 1.025 (ref 1.005–1.030)
Urobilinogen, UA: 1 mg/dL (ref 0.0–1.0)
pH: 7 (ref 5.0–8.0)

## 2021-05-28 MED ORDER — METRONIDAZOLE 500 MG PO TABS
500.0000 mg | ORAL_TABLET | Freq: Two times a day (BID) | ORAL | 0 refills | Status: DC
Start: 2021-05-28 — End: 2021-08-30

## 2021-05-28 NOTE — ED Provider Notes (Signed)
[place]    CSN: [id number] Arrival date & time: 05/28/21  1405      History   Chief Complaint Chief Complaint  Patient presents with   Wound Check   Vaginal Pain    HPI Erika Boyer is a 26 y.o. female.   Pt complains of increased vaginal discharge with foul odor that started several days ago.  Requesting STD testing today.  She also reports dysuria.  Denies abdominal pain, flank pain, fever, chills, n/v/d, pelvic pain. She has a wound to right breast that she reports noticing about three months ago.  Denies pain, drainage, redness, swelling.  She has had a similar slow healing wound to this area in the past.  Reports she had a breast reduction and has experienced wounds around incision.  She has applied neosporin to the area.    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[place])    Cervical high risk HPV (human papillomavirus) test positive 03/20/2017   Pap q year   Chronic bronchitis ([place])    Daily headache    Depression    Graves disease 2012   hx; "was on RX; it disappeared" (10/11/2016)   History of blood transfusion    "related to the anemia" (10/11/2016)   Hypertension    Lupus ([place])    "not sure what kind" (10/11/2016)   Microcytic anemia 10/03/2016   Pneumonia 10/03/2016    Patient Active Problem List   Diagnosis Date Noted   Adrenal insufficiency ([place]) 03/07/2021   Joint pain 03/07/2021   Morbid obesity ([place]) 03/07/2021   Other long term (current) drug therapy 03/07/2021   Other specified abnormal immunological findings in serum 03/07/2021   Sleep difficulties 10/20/2020   Morbid obesity with BMI of 50.0-59.9, adult ([place]) 03/08/2020   Pericardial effusion 01/21/2020   Nutritional counseling 01/21/2020   S/P pericardiocentesis 07/29/2019   Anxiety 06/18/2017   Gait disorder 02/04/2017   SLE (systemic lupus erythematosus) ([place]) 10/30/2016   Cognitive  disorder    Cerebral atrophy ([place])    Intellectual disability    Drug-induced lupus erythematosus    Hyperthyroidism 10/03/2016   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/03/2016   Cardiomegaly 10/03/2016   Graves disease 08/23/2016   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   Low vitamin D level 08/23/2015   History of chlamydia 02/25/2012   Unspecified mood (affective) disorder ([place]) 07/09/2011   Neuropathy 04/10/2011    Past Surgical History:  Procedure Laterality Date   PERICARDIOCENTESIS N/A 10/04/2016   Procedure: Pericardiocentesis;  Surgeon: Adrian Prows, MD;  Location: [facility];  Service: Cardiovascular;  Laterality: N/A;   REDUCTION MAMMAPLASTY Bilateral 2012   TONSILLECTOMY      OB History     Gravida  2   Para  1   Term  0   Preterm  1   AB  1   Living  0      SAB  0   IAB  1   Ectopic  0   Multiple  0   Live Births  0            Home Medications    Prior to Admission medications   Medication Sig Start Date End Date Taking? Authorizing Provider  metroNIDAZOLE (FLAGYL) 500 MG tablet Take 1 tablet (500 mg total) by mouth 2 (two) times daily. 05/28/21  Yes Ward, Lenise Arena,  PA-C  acetaminophen (TYLENOL) 500 MG tablet Take 1 tablet (500 mg total) by mouth every 6 (six) hours as needed. 12/08/18   Zigmund Gottron, NP  albuterol (VENTOLIN HFA) 108 (90 Base) MCG/ACT inhaler Inhale 2 puffs into the lungs every 4 (four) hours as needed for wheezing or shortness of breath. 10/20/20   Elsie Stain, MD  diphenhydramine-acetaminophen (TYLENOL PM EXTRA STRENGTH) 25-500 MG TABS tablet 1 tablet at bedtime as needed    [provider]  Hydroxychloroquine Sulfate 100 MG TABS Take 200 mg by mouth daily.    [provider]    Family History Family History  Problem Relation Age of Onset   Thalassemia Mother    Arthritis Mother    Breast cancer Mother        Mastectomy in early 32's.     High blood pressure Father     Mental illness Father        question bipolar disorder per wife   Sickle cell trait Maternal Aunt    Breast cancer Maternal Aunt        4 maternal aunts w/breast cancer   Breast cancer Maternal Grandmother        Bone   Multiple sclerosis Cousin    Diabetes Paternal Grandfather     Social History Social History   Tobacco Use   Smoking status: Never   Smokeless tobacco: Never  Vaping Use   Vaping Use: Never used  Substance Use Topics   Alcohol use: No   Drug use: No     Allergies   Shellfish allergy, Morphine and related, Mushroom extract complex, and Tapazole [methimazole]   Review of Systems Review of Systems  Constitutional:  Negative for chills and fever.  HENT:  Negative for ear pain and sore throat.   Eyes:  Negative for pain and visual disturbance.  Respiratory:  Negative for cough and shortness of breath.   Cardiovascular:  Negative for chest pain and palpitations.  Gastrointestinal:  Negative for abdominal pain and vomiting.  Genitourinary:  Positive for dysuria and vaginal discharge. Negative for flank pain, frequency, hematuria, pelvic pain and urgency.  Musculoskeletal:  Negative for arthralgias and back pain.  Skin:  Positive for wound (right breast). Negative for color change and rash.  Neurological:  Negative for seizures and syncope.  All other systems reviewed and are negative.   Physical Exam Triage Vital Signs ED Triage Vitals  Enc Vitals Group     BP 05/28/21 1514 (!) 141/85     Pulse Rate 05/28/21 1514 89     Resp 05/28/21 1514 20     Temp 05/28/21 1514 98.7 F (37.1 C)     Temp Source 05/28/21 1514 Oral     SpO2 05/28/21 1514 100 %     Weight --      Height --      Head Circumference --      Peak Flow --      Pain Score 05/28/21 1512 0     Pain Loc --      Pain Edu? --      Excl. in [place]? --    No data found.  Updated Vital Signs BP (!) 141/85 (BP Location: Left Arm)    Pulse 89    Temp 98.7 F (37.1 C) (Oral)    Resp 20    LMP  05/14/2021    SpO2 100%   Visual Acuity Right Eye Distance:   Left Eye Distance:   Bilateral Distance:  Right Eye Near:   Left Eye Near:    Bilateral Near:     Physical Exam Vitals and nursing note reviewed.  Constitutional:      General: She is not in acute distress.    Appearance: She is well-developed.  HENT:     Head: Normocephalic and atraumatic.  Eyes:     Conjunctiva/sclera: Conjunctivae normal.  Cardiovascular:     Rate and Rhythm: Normal rate and regular rhythm.     Heart sounds: No murmur heard. Pulmonary:     Effort: Pulmonary effort is normal. No respiratory distress.     Breath sounds: Normal breath sounds.  Abdominal:     Palpations: Abdomen is soft.     Tenderness: There is no abdominal tenderness.  Musculoskeletal:        General: No swelling.     Cervical back: Neck supple.  Skin:    General: Skin is warm and dry.     Capillary Refill: Capillary refill takes less than 2 seconds.     Comments: 1 cm x 1 cm superficial wound with base of wound granular tissue.  No discharge noted, no surrounding redness or swelling.   Neurological:     Mental Status: She is alert.  Psychiatric:        Mood and Affect: Mood normal.     UC Treatments / Results  Labs (all labs ordered are listed, but only abnormal results are displayed) Labs Reviewed  POCT URINALYSIS DIPSTICK, ED / UC - Abnormal; Notable for the following components:      Result Value   Hgb urine dipstick TRACE (*)    Leukocytes,Ua TRACE (*)    All other components within normal limits  CERVICOVAGINAL ANCILLARY ONLY    EKG   Radiology No results found.  Procedures Procedures (including critical care time)  Medications Ordered in UC Medications - No data to display  Initial Impression / Assessment and Plan / UC Course  I have reviewed the triage vital signs and the nursing notes.  Pertinent labs & imaging results that were available during my care of the patient were reviewed by me and  considered in my medical decision making (see chart for details).     Bacterial vaginosis, flagyl prescribed. Cervicovaginal swab pending.  Will change treatment plan if indicated.   Recommend follow up with wound care due to slow healing wound.  Wound dressed today.  Final Clinical Impressions(s) / UC Diagnoses   Final diagnoses:  Bacterial vaginitis  Wound of right breast, initial encounter     Discharge Instructions      Take medication as prescribed We will call you with test results  Call wound care to follow up about slow healing wound      ED Prescriptions     Medication Sig Dispense Auth. Provider   metroNIDAZOLE (FLAGYL) 500 MG tablet Take 1 tablet (500 mg total) by mouth 2 (two) times daily. 14 tablet Ward, Lenise Arena, PA-C      PDMP not reviewed this encounter.   Ward, Lenise Arena, PA-C 05/28/21 1601

## 2021-05-28 NOTE — ED Triage Notes (Signed)
Patient adds that she would like STD testing as "had pain in my uterus for 4-5 days." Reports vaginal odor but denies discharge.

## 2021-05-28 NOTE — ED Triage Notes (Signed)
Pt reports that when was 26 yo had breast reduction and had issues with infections at surgical site. Reports that since August has open wound on right breast. Denies drainage or pain. Marland Kitchen

## 2021-05-28 NOTE — Discharge Instructions (Signed)
Take medication as prescribed We will call you with test results  Call wound care to follow up about slow healing wound

## 2021-05-29 LAB — CERVICOVAGINAL ANCILLARY ONLY
Bacterial Vaginitis (gardnerella): POSITIVE — AB
Candida Glabrata: NEGATIVE
Candida Vaginitis: NEGATIVE
Chlamydia: NEGATIVE
Comment: NEGATIVE
Comment: NEGATIVE
Comment: NEGATIVE
Comment: NEGATIVE
Comment: NEGATIVE
Comment: NORMAL
Neisseria Gonorrhea: NEGATIVE
Trichomonas: NEGATIVE

## 2021-05-31 ENCOUNTER — Encounter (HOSPITAL_BASED_OUTPATIENT_CLINIC_OR_DEPARTMENT_OTHER): Payer: Medicare HMO | Admitting: Physician Assistant

## 2021-06-06 ENCOUNTER — Ambulatory Visit: Payer: Medicare Other | Admitting: Family Medicine

## 2021-06-06 ENCOUNTER — Telehealth: Payer: Self-pay

## 2021-06-06 NOTE — Telephone Encounter (Signed)
Patient's mother dropped off forms for the patient. Next available appointment is not until March and the last time patient was seen was in October. The forms are needing to be filled out ASAP. Please advise.

## 2021-06-06 NOTE — Patient Instructions (Incomplete)

## 2021-06-06 NOTE — Progress Notes (Deleted)
PATIENT: Erika Boyer DOB: 04-24-1996  REASON FOR VISIT: follow up HISTORY FROM: patient  No chief complaint on file.    HISTORY OF PRESENT ILLNESS: Today 06/06/21 Erika Boyer is a 26 y.o. female here today for follow up. She has history of cerebral degeneration. She is [redacted] weeks pregnant. She called last week and asked if any needed changes in care due to pregnancy. No recommendations in change were needed. She returns today to discuss concerns of worsening balance and falls. She feels that she is having more trouble balancing. She has gained 30 pounds since last being seen in 06/2019. She has stopped going to the gym. She feels that people at the gym are insensitive to her condition. She has fell 3 times over the past three months. All three falls were in her apartment. She reports that her legs will slip out form underneath her. She is concerned that this is worse due to having wood flooring. She has participated in PT in the past and felt that she needed more of a workout. She is hesitant to consider this but her mother is adamant that she consider this. She is also asking for a letter of accomodation so that she can get carpet in her apartment.    HISTORY: (copied from my note on 07/08/2019)  Erika Boyer is a 26 y.o. female here today for follow up for cerebral degeneration. She is doing fairly well. She is anxious about gaining weight. She has been less active due to pandemic. She is wanting to be considered for bariatric surgery but is not sure she can follow the recommended diet and follow up as advised. She is working with PCP to establish a plan. She has an upcoming appt with Dr Jillyn HiddenFulp next month. She is using her Rolator most of the time. She denies recent falls. She did have a fall in the summer but reports that it was due to her being "careless." She is working with PT and OT.    HISTORY: (copied from [employer] Works[employer]yn Martin's note on 07/02/2018)   UPDATE 2/12/2020CM Ms.Harrold DonathIziokhai,  26 year old female returns for follow-up with a history of postviral cerebellar degeneration and anxiety.  She has a new complaint today of headaches.  She eats a lot of fast food according to the mom which would contribute to that problem She also takes a lot of ibuprofen for joint pain, more than the recommended dose according to the mom and patient was made aware that this can cause rebound headache.  She does not exercise.  She has occasional dizziness.  She denies difficulty sleeping.  Mom provides most of the history.  She has a history of lupus  and is on Plaquenil.  She returns for reevaluation   Interval history 06/18/2017: AANew problem today. Anxiety is worsening. As a result of her Illness and disability. She is very anxious, her disability affectes her, she is self conscious. She has mood swings due to this and gets agitated. Since the cat died she is having more emotional problems. Animals calm her, she is happier with animals and more interactive. She improved in the past. Recently she is more moody, more quick to anger. Lots of anxiety. She is having difficulty sleeping due to anxiety. She gets upset. Mother is here and provides most information. Patient declines any medication management for anxiety.    HPI:  Erika Boyer is a 26 y.o. female here as a referral from Dr. Lily KocherGomez for cerebellar atrophy. Past medical history of morbid obesity,  acute kidney injury, drug-induced lupus erythematosus, cognitive disorder, ligament hypertension, cerebellar atrophy. 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 MRI indicated cerebellar/pontine degeneration. Patient was worked up in the past possibly with genetics not clear from records. Cerebellar degeneration of unknown etiology, multiple needs. She has been evaluated at [facility] in [place]. She was seeing neurology for about 5 years there. They  did genetic testing. She was diagnosed with post-viral cerebellar degeneration. She just started falling at the age 26. She last fell a few days ago.  She has physical therapy to her house. Since being out of the hospital she feels better. She is taking a lot of medication. Physical therapy is helping. She feels her balance is better. Her writing is better. She gets tremors, they are off and on. She gets tremors when she tries to concentrate. They are in her head. The tremor does not bother her. It doesn't affect her, she gets double which is chronic, no problems speaking, she speaks slowly. She graduated HS. She didn;t like college   REVIEW OF SYSTEMS: Out of a complete 14 system review of symptoms, the patient complains only of the following symptoms, cognitive impairment, imbalance, falls, anxiety and all other reviewed systems are negative.   ALLERGIES: Allergies  Allergen Reactions   Shellfish Allergy Anaphylaxis   Morphine And Related Itching   Mushroom Extract Complex Other (See Comments)    Mother is allergic, so patient avoids these   Tapazole [Methimazole] Swelling and Rash    WELTS (also) Mother suspects this MAY be an allergy, as the onset of symptoms coincided with this being started    HOME MEDICATIONS: Outpatient Medications Prior to Visit  Medication Sig Dispense Refill   acetaminophen (TYLENOL) 500 MG tablet Take 1 tablet (500 mg total) by mouth every 6 (six) hours as needed. 30 tablet 0   albuterol (VENTOLIN HFA) 108 (90 Base) MCG/ACT inhaler Inhale 2 puffs into the lungs every 4 (four) hours as needed for wheezing or shortness of breath. 18 g 0   diphenhydramine-acetaminophen (TYLENOL PM EXTRA STRENGTH) 25-500 MG TABS tablet 1 tablet at bedtime as needed     Hydroxychloroquine Sulfate 100 MG TABS Take 200 mg by mouth daily.     metroNIDAZOLE (FLAGYL) 500 MG tablet Take 1 tablet (500 mg total) by mouth 2 (two) times daily. 14 tablet 0   No facility-administered medications  prior to visit.    PAST MEDICAL HISTORY: 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) test positive 03/20/2017   Pap q year   Chronic bronchitis ([facility])    Daily headache    Depression    Graves disease 2012   hx; "was on RX; it disappeared" (10/11/2016)   History of blood transfusion    "related to the anemia" (10/11/2016)   Hypertension    Lupus ([facility])    "not sure what kind" (10/11/2016)   Microcytic anemia 10/03/2016   Pneumonia 10/03/2016    PAST SURGICAL HISTORY: Past Surgical History:  Procedure Laterality Date   PERICARDIOCENTESIS N/A 10/04/2016   Procedure: Pericardiocentesis;  Surgeon: Yates DecampGanji, Jay, MD;  [facility];  Service: Cardiovascular;  Laterality: N/A;   REDUCTION MAMMAPLASTY Bilateral 2012   TONSILLECTOMY      FAMILY  HISTORY: Family History  Problem Relation Age of Onset   Thalassemia Mother    Arthritis Mother    Breast cancer Mother        Mastectomy in early 62's.     High blood pressure Father    Mental illness Father        question bipolar disorder per wife   Sickle cell trait Maternal Aunt    Breast cancer Maternal Aunt        4 maternal aunts w/breast cancer   Breast cancer Maternal Grandmother        Bone   Multiple sclerosis Cousin    Diabetes Paternal Grandfather     SOCIAL HISTORY: Social History   Socioeconomic History   Marital status: Single    Spouse name: Not on file   Number of children: 0   Years of education: 14   Highest education level: Some college, no degree  Occupational History   Not on file  Tobacco Use   Smoking status: Never   Smokeless tobacco: Never  Vaping Use   Vaping Use: Never used  Substance and Sexual Activity   Alcohol use: No   Drug use: No   Sexual activity: Yes    Birth control/protection: None  Other Topics Concern   Not on file  Social  History Narrative   Lives at home w/ her mom   Right-handed   Caffeine: occasional coffee   Has home care pca for adl's   Social Determinants of Health   Financial Resource Strain: Low Risk    Difficulty of Paying Living Expenses: Not hard at all  Food Insecurity: Food Insecurity Present   Worried About Programme researcher, broadcasting/film/video in the Last Year: Often true   Barista in the Last Year: Never true  Transportation Needs: No Transportation Needs   Lack of Transportation (Medical): No   Lack of Transportation (Non-Medical): No  Physical Activity: Inactive   Days of Exercise per Week: 0 days   Minutes of Exercise per Session: 0 min  Stress: No Stress Concern Present   Feeling of Stress : Not at all  Social Connections: Moderately Isolated   Frequency of Communication with Friends and Family: More than three times a week   Frequency of Social Gatherings with Friends and Family: More than three times a week   Attends Religious Services: 1 to 4 times per year   Active Member of [employer] or Organizations: No   Attends Banker Meetings: Never   Marital Status: Never married  Intimate Partner Violence: At Risk   Fear of Current or Ex-Partner: No   Emotionally Abused: Yes   Physically Abused: No   Sexually Abused: No      PHYSICAL EXAM  There were no vitals filed for this visit.  There is no height or weight on file to calculate BMI.  Generalized: Well developed, in no acute distress  Cardiology: normal rate and rhythm, no murmur noted Respiratory: clear to auscultation bilaterally  Neurological examination  Mentation: Alert oriented to time, place, history taking. Follows all commands speech and language fluent, some delayed responses, emotionally labile, bursts into tears when talking about feeling judged at the gym.  Cranial nerve II-XII: Pupils were equal round reactive to light. Extraocular movements were full, visual field were full on confrontational test. Facial  sensation and strength were normal. Head turning and shoulder shrug  were normal and symmetric. Motor: The motor testing reveals 5 over 5 strength of all  4 extremities. Good symmetric motor tone is noted throughout.  Sensory: Sensory testing is intact to soft touch on all 4 extremities. No evidence of extinction is noted.  Gait and station: Gait is wide, partially due to body habitus, BMI 56, slightly unsteady without assistive device, she did not bring her walker as it would not fit in her mothers car. Tandem not attempted. She is impulsive in standing and quick turns.   DIAGNOSTIC DATA (LABS, IMAGING, TESTING) - I reviewed patient records, labs, notes, testing and imaging myself where available.  No flowsheet data found.   Lab Results  Component Value Date   WBC 3.3 (L) 10/20/2020   HGB 11.9 10/20/2020   HCT 40.3 10/20/2020   MCV 81 10/20/2020   PLT 311 10/20/2020      Component Value Date/Time   NA 140 10/20/2020 1207   K 4.2 10/20/2020 1207   CL 103 10/20/2020 1207   CO2 23 10/20/2020 1207   GLUCOSE 75 10/20/2020 1207   GLUCOSE 94 03/18/2020 2106   BUN 12 10/20/2020 1207   CREATININE 0.85 10/20/2020 1207   CALCIUM 9.6 10/20/2020 1207   PROT 8.1 10/20/2020 1207   ALBUMIN 4.1 10/20/2020 1207   AST 22 10/20/2020 1207   ALT 17 10/20/2020 1207   ALKPHOS 84 10/20/2020 1207   BILITOT 0.4 10/20/2020 1207   GFRNONAA >60 03/18/2020 2106   GFRAA 143 11/25/2019 1158   Lab Results  Component Value Date   CHOL 82 10/04/2016   TRIG 139 10/03/2016   Lab Results  Component Value Date   HGBA1C 5.3 10/20/2020   Lab Results  Component Value Date   VITAMINB12 275 05/07/2017   Lab Results  Component Value Date   TSH 2.020 10/20/2020     ASSESSMENT AND PLAN 26 y.o. year old female  has a past medical history of Anemia, Anxiety, Arthritis, Cerebellar degeneration (HCC), Cervical high risk HPV (human papillomavirus) test positive (03/20/2017), Chronic bronchitis ([facility]), Daily  headache, Depression, Graves disease (2012), History of blood transfusion, Hypertension, Lupus ([facility]), Microcytic anemia (10/03/2016), and Pneumonia (10/03/2016). here with   No diagnosis found.   Jackalyn Lombard is now [redacted] weeks pregnant. She reports worsening balance over the past 2-3 months resulting in three falls. I am concerned that this is multifactorial. Cerebellar degeneration could most defiantly contribute. Patient has gained 30 pounds with pregnancy, BMI >56. It is unclear how much she understands regarding safety and fall precautions. She is not using her walker today but reports using it at home. I have offered to send her to PT for evaluation and treatment. I am concerned about her unwillingness to participate, however, her mother is adamant that she try it. She was advised to provide me with more specific information from her apartment for accommodations to flooring. I will discuss with Dr Lucia Gaskins and PT pending evaluation. She was encouraged to follow up closely with OB. We will follow up in 6-12 months pending PT eval. She and her mother verbalize understanding and agreement with this plan.    Addendum: Following discussion with Dr Lucia Gaskins, I have also placed referral to [facility]. I will send updated notes to PCP. No follow up needed with neurology. Patient updated on plan of care.    No orders of the defined types were placed in this encounter.    No orders of the defined types were placed in this encounter.     I spent 30 minutes with the patient. 50% of this time was spent  counseling and educating patient on plan of care and medications.     Shawnie Dapper, FNP-C 06/06/2021, 7:53 AM [facility] [address] [place], [place] [address] [phone]

## 2021-06-07 NOTE — Telephone Encounter (Signed)
Will call pt once Delford Field is done with paperwork

## 2021-06-07 NOTE — Telephone Encounter (Signed)
Called pt and paperwork is ready

## 2021-06-08 ENCOUNTER — Encounter: Payer: Self-pay | Admitting: Critical Care Medicine

## 2021-06-08 DIAGNOSIS — N926 Irregular menstruation, unspecified: Secondary | ICD-10-CM

## 2021-06-14 ENCOUNTER — Encounter: Payer: Medicare HMO | Attending: Surgery | Admitting: Skilled Nursing Facility1

## 2021-06-14 ENCOUNTER — Other Ambulatory Visit: Payer: Self-pay

## 2021-06-14 ENCOUNTER — Encounter: Payer: Self-pay | Admitting: Skilled Nursing Facility1

## 2021-06-14 DIAGNOSIS — Z6841 Body Mass Index (BMI) 40.0 and over, adult: Secondary | ICD-10-CM | POA: Insufficient documentation

## 2021-06-14 DIAGNOSIS — E0500 Thyrotoxicosis with diffuse goiter without thyrotoxic crisis or storm: Secondary | ICD-10-CM | POA: Diagnosis not present

## 2021-06-14 DIAGNOSIS — Z713 Dietary counseling and surveillance: Secondary | ICD-10-CM | POA: Insufficient documentation

## 2021-06-14 DIAGNOSIS — E669 Obesity, unspecified: Secondary | ICD-10-CM

## 2021-06-14 NOTE — Progress Notes (Signed)
Nutrition Assessment for Bariatric Surgery Medical Nutrition Therapy   Referral stated Supervised Weight Loss (SWL) visits needed: 6  Planned surgery: sleeve gastrectomy  Pt expectation of surgery: to lose weight  Pt expectation of dietitian: unknown     NUTRITION ASSESSMENT   Anthropometrics  Start weight at [facility]: 350.7 lbs (date: 06/14/2021)  Height: 65 in BMI: 58.36 kg/m2     Clinical  Medical hx: Graves, cerebellar degeneration, Lupus Medications: see list  Labs: none in EMR Notable signs/symptoms:  Any previous deficiencies? Yes: b12  Micronutrient Nutrition Focused Physical Exam: Hair: No issues observed Eyes: No issues observed Mouth: No issues observed Neck: No issues observed Nails: No issues observed Skin: No issues observed  Lifestyle & Dietary Hx  Pt states she struggles with sleep off and on sometimes sleepng all day long.   Pt states she is NOT allergic to shellfish.  Pt states her mom wants her to get the RYGB because you lose weight faster: dietitian educated pt on this topic. Pt states her mom is not emotionally supportive.   Pt states she never really listened to the dietitians before but feels this time it will be different.   Pt states she misses [place] and will be moving back after surgery or maybe before surgery stating she signed up for houseing back when she was 18 years and forgot so is excited to get placed in [place] stating she thinks it would be best for her to move there to get away form her friends that he has a fraught relationship with.   Pt states she does stress eat without control.  Pt states she realizes she can customize her food when she eats at home and it is cheaper so would like to eat more from home. 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 Pt states she wants to get a job and have a sense of purpose and when she goes to [place] she  wants to go to college.   Pt states she goes to sleep at 4-5am and wakes 2pm but now wakes around 10am-12pm  Pt states she has a cat which helped her to get up sooner so she can let him out in the day  Pt states she wants to have melon instead of candy.   Pt state she is not currently open to working with a mental health professional at this time but states she will think about it.   24-Hr Dietary Recall First Meal: skipped Snack:  Second Meal:  Snack: fruit rollup  Third Meal 6-8pm: frozen pizza + chips + soda Snack 11-12: bagel sandwich with mashed potatoes Beverages: soda, water, liquor   Estimated Energy Needs Calories: 1600   NUTRITION DIAGNOSIS  Overweight/obesity ([place]-3.3) related to past poor dietary habits and physical inactivity as evidenced by patient w/ planned sleeve gastractomy  surgery following dietary guidelines for continued weight loss.    NUTRITION INTERVENTION: Majority of appt spent empathetically listening to pts concerns within her current physchosocial status Nutrition counseling (C-1) and education (E-2) to facilitate bariatric surgery goals.  Educated pt on micronutrient deficiencies post surgery and strategies to mitigate that risk   Pre-Op Goals Reviewed with the Patient: Not reviewed at this time due to pts inability to focus on nutrition specifically  Track food and beverage intake (pen and paper, MyFitness Pal, Baritastic app, etc.) Make healthy food choices while monitoring portion  sizes Consume 3 meals per day or try to eat every 3-5 hours Avoid concentrated sugars and fried foods Keep sugar & fat in the single digits per serving on food labels Practice CHEWING your food (aim for applesauce consistency) Practice not drinking 15 minutes before, during, and 30 minutes after each meal and snack Avoid all carbonated beverages (ex: soda, sparkling beverages)  Limit caffeinated beverages (ex: coffee, tea, energy drinks) Avoid all sugar-sweetened  beverages (ex: regular soda, sports drinks)  Avoid alcohol  Aim for 64-100 ounces of FLUID daily (with at least half of fluid intake being plain water)  Aim for at least 60-80 grams of PROTEIN daily Look for a liquid protein source that contains ?15 g protein and ?5 g carbohydrate (ex: shakes, drinks, shots) Make a list of non-food related activities Physical activity is an important part of a healthy lifestyle so keep it moving! The goal is to reach 150 minutes of exercise per week, including cardiovascular and weight baring activity. Think about working with a mental health professional Aim for a structured day of eating  *Goals that are bolded indicate the pt would like to start working towards these  Handouts Provided Include  Bariatric Surgery handouts (Nutrition Visits, Pre-Op Goals, Protein Shakes, Vitamins & Minerals)  Learning Style & Readiness for Change Teaching method utilized: Visual & Auditory  Demonstrated degree of understanding via: Teach Back  Readiness Level: pre-contemplative Barriers to learning/adherence to lifestyle change: ETOH/cerebellar degeneration   RD's Notes for Next Visit Assess pts adherence to chosen goals     MONITORING & EVALUATION Dietary intake, weekly physical activity, body weight, and pre-op goals reached at next nutrition visit.    Next Steps  Patient is to follow up at [facility] for Pre-Op Class >2 weeks before surgery for further nutrition education.  Return for next SWL

## 2021-06-20 ENCOUNTER — Other Ambulatory Visit: Payer: Self-pay

## 2021-06-20 ENCOUNTER — Encounter (HOSPITAL_BASED_OUTPATIENT_CLINIC_OR_DEPARTMENT_OTHER): Payer: Medicare HMO | Attending: Physician Assistant | Admitting: Internal Medicine

## 2021-06-20 DIAGNOSIS — E039 Hypothyroidism, unspecified: Secondary | ICD-10-CM | POA: Diagnosis not present

## 2021-06-20 DIAGNOSIS — L98498 Non-pressure chronic ulcer of skin of other sites with other specified severity: Secondary | ICD-10-CM | POA: Insufficient documentation

## 2021-06-20 DIAGNOSIS — M329 Systemic lupus erythematosus, unspecified: Secondary | ICD-10-CM | POA: Diagnosis not present

## 2021-06-20 DIAGNOSIS — Z833 Family history of diabetes mellitus: Secondary | ICD-10-CM | POA: Diagnosis not present

## 2021-06-20 DIAGNOSIS — G6289 Other specified polyneuropathies: Secondary | ICD-10-CM | POA: Diagnosis not present

## 2021-06-20 DIAGNOSIS — L98492 Non-pressure chronic ulcer of skin of other sites with fat layer exposed: Secondary | ICD-10-CM | POA: Diagnosis not present

## 2021-06-20 NOTE — Progress Notes (Signed)
Barlowe, Harlee ([id number]) °Visit Report for 06/20/2021 °Chief Complaint Document Details °Patient Name: Date of Service: °Erika Boyer, Erika Boyer 06/20/2021 9:00 A M °Medical Record Number: [id number] °Patient Account Number: [id number] °Date of Birth/Sex: Treating RN: °07/06/1995 (25 y.Erika. F) Lynch, Shatara °Primary Care Provider: Wright, Patrick Other Clinician: °Referring Provider: °Treating Provider/Extender: ,  °Wright, Patrick °Weeks in Treatment: 0 °Information Obtained from: Patient °Chief Complaint °06/20/2021; patient arrives for concern of a chronic wound on the inferior surface of her right breast °Electronic Signature(s) °Signed: 06/20/2021 4:28:46 PM By: ,  MD °Entered By: ,  on 06/20/2021 10:42:09 °-------------------------------------------------------------------------------- °Debridement Details °Patient Name: Date of Service: °Erika Boyer, Erika Boyer 06/20/2021 9:00 A M °Medical Record Number: [id number] °Patient Account Number: [id number] °Date of Birth/Sex: Treating RN: °10/09/1995 (25 y.Erika. F) Lynch, Shatara °Primary Care Provider: Wright, Patrick Other Clinician: °Referring Provider: °Treating Provider/Extender: ,  °Wright, Patrick °Weeks in Treatment: 0 °Debridement Performed for Assessment: Wound #1 Right Breast °Performed By: Physician ,  G., MD °Debridement Type: Debridement °Level of Consciousness (Pre-procedure): Awake and Alert °Pre-procedure Verification/Time Out Yes - 09:44 °Taken: °Start Time: 09:44 °T Area Debrided (L x W): °otal 1.4 (cm) x 1.6 (cm) = 2.24 (cm²) °Tissue and other material debrided: °Non-Viable, Skin: Epidermis °Level: Skin/Epidermis °Debridement Description: Selective/Open Wound °Instrument: Curette °Bleeding: Minimum °Hemostasis Achieved: Pressure °End Time: 09:45 °Procedural Pain: 0 °Post Procedural Pain: 0 °Response to Treatment: Procedure was tolerated well °Level of Consciousness (Post- Awake and  Alert °procedure): °Post Debridement Measurements of Total Wound °Length: (cm) 1.4 °Width: (cm) 1.6 °Depth: (cm) 0.1 °Volume: (cm³) 0.176 °Character of Wound/Ulcer Post Debridement: Improved °Post Procedure Diagnosis °Same as Pre-procedure °Electronic Signature(s) °Signed: 06/20/2021 4:28:46 PM By: ,  MD °Signed: 06/20/2021 5:37:15 PM By: Lynch, Shatara RN, BSN °Entered By: ,  on 06/20/2021 10:40:07 °-------------------------------------------------------------------------------- °HPI Details °Patient Name: Date of Service: °Erika Boyer, Erika Boyer 06/20/2021 9:00 A M °Medical Record Number: [id number] °Patient Account Number: [id number] °Date of Birth/Sex: Treating RN: °03/21/1996 (25 y.Erika. F) Lynch, Shatara °Primary Care Provider: Wright, Patrick Other Clinician: °Referring Provider: °Treating Provider/Extender: ,  °Wright, Patrick °Weeks in Treatment: 0 °History of Present Illness °HPI Description: ADMISSION °06/20/2021 °This is a 25-year-old woman who tells me that she had a reduction mammoplasty at age 12. This was done in [place]. Ever since then she has had areas on °the inferior aspects of both breasts that will open and close spontaneously. She is here for 1 of these wounds on the inferior part of her right breast at roughly °6:00 that has not healed in 2 to 3 months. She has been using Neosporin topically. °The patient has morbid obesity, she also has SLE on hydroxychloroquine. 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 She does have a history of °pericardial effusions and pleural effusions presumably related to her lupus although Boyer am not sure about that. °Electronic Signature(s) °Signed: 06/20/2021 4:28:46 PM By: ,  MD °Entered By: ,  on 06/20/2021 10:43:39 °-------------------------------------------------------------------------------- °Physical Exam Details °Patient Name: Date of Service: °Erika Boyer, Erika Boyer 06/20/2021 9:00 A M °Medical  Record Number: [id number] °Patient Account Number: [id number] °Date of Birth/Sex: Treating RN: °05/23/1995 (25 y.Erika. F) Lynch, Shatara °Primary Care Provider: Wright, Patrick Other Clinician: °Referring Provider: °Treating Provider/Extender: ,  °Wright, Patrick °Weeks in Treatment: 0 °Constitutional °Sitting or standing Blood Pressure is within target range for patient.. Pulse regular and within target range for patient.. Respirations regular, non-labored and °within target range.. Temperature is normal and   within the target range for the patient.. Appears in no distress. °Notes °Wound exam; the patient has a dime sized wound on the anterior part of her right breast midline at roughly 6:00. This has dry flaking skin around the margins °which Boyer removed with a # curette. Also noticed areas in close proximity to this wound that look like dry flaking nodules. Also between her breasts small blistered °areas that look like pustules. °Electronic Signature(s) °Signed: 06/20/2021 4:28:46 PM By: ,  MD °Entered By: ,  on 06/20/2021 10:45:05 °-------------------------------------------------------------------------------- °Physician Orders Details °Patient Name: °Date of Service: °Erika Boyer, Erika Boyer 06/20/2021 9:00 A M °Medical Record Number: [id number] °Patient Account Number: [id number] °Date of Birth/Sex: °Treating RN: °06/26/1995 (25 y.Erika. F) Lynch, Shatara °Primary Care Provider: Wright, Patrick °Other Clinician: °Referring Provider: °Treating Provider/Extender: ,  °Wright, Patrick °Weeks in Treatment: 0 °Verbal / Phone Orders: No °Diagnosis Coding °Follow-up Appointments °ppointment in 1 week. - Dr.  - Tuesday Room 2 with Shatara °Return A °Bathing/ Shower/ Hygiene °May shower with protection but do not get wound dressing(s) wet. °Wound Treatment °Wound #1 - Breast Wound Laterality: Right °Cleanser: Soap and Water Every Other Day/15 Days °Discharge Instructions: May shower  and wash wound with dial antibacterial soap and water prior to dressing change. °Cleanser: Byram Ancillary Kit - 15 Day Supply (DME) (Generic) Every Other Day/15 Days °Discharge Instructions: Use supplies as instructed; Kit contains: (15) Saline Bullets; (15) 3x3 Gauze; 15 pr Gloves °Prim Dressing: FIBRACOL Plus Dressing, 2x2 in (collagen) (DME) (Generic) Every Other Day/15 Days °ary °Discharge Instructions: Moisten collagen with saline or hydrogel °Secondary Dressing: Bordered Gauze, 4x4 in (DME) (Generic) Every Other Day/15 Days °Discharge Instructions: Apply over primary dressing as directed. °Electronic Signature(s) °Signed: 06/20/2021 4:28:46 PM By: ,  MD °Signed: 06/20/2021 5:37:15 PM By: Lynch, Shatara RN, BSN °Entered By: Lynch, Shatara on 06/20/2021 09:48:56 °-------------------------------------------------------------------------------- °Problem List Details °Patient Name: °Date of Service: °Erika Boyer, Erika Boyer 06/20/2021 9:00 A M °Medical Record Number: [id number] °Patient Account Number: [id number] °Date of Birth/Sex: °Treating RN: °04/20/1996 (25 y.Erika. F) Lynch, Shatara °Primary Care Provider: Wright, Patrick °Other Clinician: °Referring Provider: °Treating Provider/Extender: ,  °Wright, Patrick °Weeks in Treatment: 0 °Active Problems °ICD-10 °Encounter °Code Description Active Date MDM °Diagnosis °S21.001S Unspecified open wound of right breast, sequela 06/20/2021 No Yes °L98.498 Non-pressure chronic ulcer of skin of other sites with other specified severity 06/20/2021 No Yes °Inactive Problems °Resolved Problems °Electronic Signature(s) °Signed: 06/20/2021 4:28:46 PM By: ,  MD °Entered By: ,  on 06/20/2021 09:59:16 °-------------------------------------------------------------------------------- °Progress Note Details °Patient Name: Date of Service: °Erika Boyer, Erika Boyer 06/20/2021 9:00 A M °Medical Record Number: [id number] °Patient Account Number:  [id number] °Date of Birth/Sex: Treating RN: °02/23/1996 (25 y.Erika. F) Lynch, Shatara °Primary Care Provider: Wright, Patrick Other Clinician: °Referring Provider: °Treating Provider/Extender: ,  °Wright, Patrick °Weeks in Treatment: 0 °Subjective °Chief Complaint °Information obtained from Patient °06/20/2021; patient arrives for concern of a chronic wound on the inferior surface of her right breast °History of Present Illness (HPI) °ADMISSION °06/20/2021 °This is a 25-year-old woman who tells me that she had a reduction mammoplasty at age 12. This was done in [place]. Ever since then she has had areas on °the inferior aspects of both breasts that will open and close spontaneously. She is here for 1 of these wounds on the inferior part of her right breast at roughly °6:00 that has not healed in 2 to 3 months. She has been using Neosporin   topically. The patient has morbid obesity, she also has SLE on hydroxychloroquine. 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 She does have a history of pericardial effusions and pleural effusions presumably related to her lupus although Boyer am not sure about that. Patient History Information obtained from Patient. Allergies morphine, mushroom, methimazole Family History Cancer - Mother,Maternal Grandparents, Diabetes - Paternal Grandparents, Hypertension - Father, No family history of Hereditary Spherocytosis, Kidney Disease, Lung Disease, Seizures, Stroke, Thyroid Problems, Tuberculosis. Social History Never smoker, Marital Status - Single, Alcohol Use - Rarely, Drug Use - No History, Caffeine Use - Rarely. Medical History Immunological Patient has history of Lupus Erythematosus Neurologic Patient has history of Neuropathy Medical A Surgical History Notes nd Endocrine Hyperthyroidism Neurologic Cerebral atrophy Review of Systems (ROS) Constitutional Symptoms (General Health) Denies complaints or symptoms of Fatigue, Fever, Chills, Marked Weight  Change. Eyes Denies complaints or symptoms of Dry Eyes, Vision Changes, Glasses / Contacts. Ear/Nose/Mouth/Throat Denies complaints or symptoms of Chronic sinus problems or rhinitis. Respiratory Denies complaints or symptoms of Chronic or frequent coughs, Shortness of Breath. Cardiovascular Denies complaints or symptoms of Chest pain. Gastrointestinal Denies complaints or symptoms of Frequent diarrhea, Nausea, Vomiting. Genitourinary Denies complaints or symptoms of Frequent urination. Integumentary (Skin) Complains or has symptoms of Wounds - wound on right breast. Musculoskeletal Denies complaints or symptoms of Muscle Pain, Muscle Weakness. Psychiatric Denies complaints or symptoms of Claustrophobia, Suicidal. Objective Constitutional Sitting or standing Blood Pressure is within target range for patient.. Pulse regular and within target range for patient.Marland Kitchen Respirations regular, non-labored and within target range.. Temperature is normal and within the target range for the patient.Marland Kitchen Appears in no distress. Vitals Time Taken: 9:13 AM, Height: 65 in, Source: Stated, Weight: 330 lbs, Source: Stated, BMI: 54.9, Temperature: 98.8 F, Pulse: 89 bpm, Respiratory Rate: 18 breaths/min, Blood Pressure: 134/85 mmHg. General Notes: Wound exam; the patient has a dime sized wound on the anterior part of her right breast midline at roughly 6:00. This has dry flaking skin around the margins which Boyer removed with a # curette. Also noticed areas in close proximity to this wound that look like dry flaking nodules. Also between her breasts small blistered areas that look like pustules. Integumentary (Hair, Skin) Wound #1 status is Open. Original cause of wound was Gradually Appeared. The date acquired was: 03/30/2021. The wound is located on the Right Breast. The wound measures 1.4cm length x 1.6cm width x 0.1cm depth; 1.759cm^2 area and 0.176cm^3 volume. There is Fat Layer (Subcutaneous Tissue)  exposed. There is no tunneling or undermining noted. There is a medium amount of serosanguineous drainage noted. The wound margin is flat and intact. There is large (67-100%) red, pink granulation within the wound bed. There is no necrotic tissue within the wound bed. Assessment Active Problems ICD-10 Unspecified open wound of right breast, sequela Non-pressure chronic ulcer of skin of other sites with other specified severity Procedures Wound #1 Pre-procedure diagnosis of Wound #1 is an Atypical located on the Right Breast . There was a Selective/Open Wound Skin/Epidermis Debridement with a total area of 2.24 sq cm performed by Ricard Dillon., MD. With the following instrument(s): Curette to remove Non-Viable tissue/material. Material removed includes Skin: Epidermis. No specimens were taken. A time out was conducted at 09:44, prior to the start of the procedure. A Minimum amount of bleeding was controlled with Pressure. The procedure was tolerated well with a pain level of 0 throughout and a pain level of 0 following the procedure. Post Debridement Measurements:  1.4cm length x 1.6cm width x 0.1cm depth; 0.176cm^3 volume. °Character of Wound/Ulcer Post Debridement is improved. °Post procedure Diagnosis Wound #1: Same as Pre-Procedure °Plan °Follow-up Appointments: °Return Appointment in 1 week. - Dr.  - Tuesday Room 2 with Shatara °Bathing/ Shower/ Hygiene: °May shower with protection but do not get wound dressing(s) wet. °WOUND #1: - Breast Wound Laterality: Right °Cleanser: Soap and Water Every Other Day/15 Days °Discharge Instructions: May shower and wash wound with dial antibacterial soap and water prior to dressing change. °Cleanser: Byram Ancillary Kit - 15 Day Supply (DME) (Generic) Every Other Day/15 Days °Discharge Instructions: Use supplies as instructed; Kit contains: (15) Saline Bullets; (15) 3x3 Gauze; 15 pr Gloves °Prim Dressing: FIBRACOL Plus Dressing, 2x2 in (collagen) (DME)  (Generic) Every Other Day/15 Days °ary °Discharge Instructions: Moisten collagen with saline or hydrogel °Secondary Dressing: Bordered Gauze, 4x4 in (DME) (Generic) Every Other Day/15 Days °Discharge Instructions: Apply over primary dressing as directed. °1. Right breast wound. Apparently with a recurrent history of such wounds that healed. Boyer am not certain whether all of this is just friction she has large breasts °and had a tight "sports bra on" but she says she does not wear a bra when she is at home. She does not work. °2. The surface of the wound looked dry and scaly. Boyer used regular collagen moistened that she will change every second day °3. Boyer wondered about an association with her lupus but off the top of my head Boyer would only him aware of discoid lupus causing something that would look like °this. Boyer will have to do more research here. °4. Boyer plan to have a look at this next week. Her rheumatologist is Dr. Hawkes, Boyer may need to give her a call °Electronic Signature(s) °Signed: 06/20/2021 4:28:46 PM By: ,  MD °Entered By: ,  on 06/20/2021 10:46:46 °-------------------------------------------------------------------------------- °HxROS Details °Patient Name: Date of Service: °Erika Boyer, Erika Boyer 06/20/2021 9:00 A M °Medical Record Number: [id number] °Patient Account Number: [id number] °Date of Birth/Sex: Treating RN: °06/07/1995 (25 y.Erika. F) Lynch, Shatara °Primary Care Provider: Wright, Patrick Other Clinician: °Referring Provider: °Treating Provider/Extender: ,  °Wright, Patrick °Weeks in Treatment: 0 °Information Obtained From °Patient °Constitutional Symptoms (General Health) °Complaints and Symptoms: °Negative for: Fatigue; Fever; Chills; Marked Weight Change °Eyes °Complaints and Symptoms: °Negative for: Dry Eyes; Vision Changes; Glasses / Contacts °Ear/Nose/Mouth/Throat °Complaints and Symptoms: °Negative for: Chronic sinus problems or rhinitis °Respiratory °Complaints and  Symptoms: °Negative for: Chronic or frequent coughs; Shortness of Breath °Cardiovascular °Complaints and Symptoms: °Negative for: Chest pain °Gastrointestinal °Complaints and Symptoms: °Negative for: Frequent diarrhea; Nausea; Vomiting °Genitourinary °Complaints and Symptoms: °Negative for: Frequent urination °Integumentary (Skin) °Complaints and Symptoms: °Positive for: Wounds - wound on right breast °Musculoskeletal °Complaints and Symptoms: °Negative for: Muscle Pain; Muscle Weakness °Psychiatric °Complaints and Symptoms: °Negative for: Claustrophobia; Suicidal °Hematologic/Lymphatic °Endocrine °Medical History: °Past Medical History Notes: °Hyperthyroidism °Immunological °Medical History: °Positive for: Lupus Erythematosus °Neurologic °Medical History: °Positive for: Neuropathy °Past Medical History Notes: °Cerebral atrophy °Oncologic °Immunizations °Pneumococcal Vaccine: °Received Pneumococcal Vaccination: No °Implantable Devices °None °Family and Social History °Cancer: Yes - Mother,Maternal Grandparents; Diabetes: Yes - Paternal Grandparents; Hereditary Spherocytosis: No; Hypertension: Yes - Father; Kidney °Disease: No; Lung Disease: No; Seizures: No; Stroke: No; Thyroid Problems: No; Tuberculosis: No; Never smoker; Marital Status - Single; Alcohol Use: °Rarely; Drug Use: No History; Caffeine Use: Rarely; Financial Concerns: No; Food, Clothing or Shelter Needs: No; Support System Lacking: No; °Transportation Concerns: No °Electronic Signature(s) °Signed: 06/20/2021   4:28:46 PM By: ,  MD °Signed: 06/20/2021 5:37:15 PM By: Lynch, Shatara RN, BSN °Entered By: Lynch, Shatara on 06/20/2021 09:23:04 °-------------------------------------------------------------------------------- °SuperBill Details °Patient Name: °Date of Service: °Erika Boyer, Erika Boyer 06/20/2021 °Medical Record Number: [id number] °Patient Account Number: [id number] °Date of Birth/Sex: °Treating RN: °11/06/1995 (25 y.Erika. F) Lynch,  Shatara °Primary Care Provider: Wright, Patrick °Other Clinician: °Referring Provider: °Treating Provider/Extender: ,  °Wright, Patrick °Weeks in Treatment: 0 °Diagnosis Coding °ICD-10 Codes °Code Description °S21.001S Unspecified open wound of right breast, sequela °L98.498 Non-pressure chronic ulcer of skin of other sites with other specified severity °Facility Procedures °CPT4 Code: [id number] °Description: 9213 - WOUND CARE VISIT-LEV 3 EST PT °Modifier: 25 °Quantity: 1 °CPT4 Code: [id number] 9 Boyer °Description: 7597 - DEBRIDE WOUND 1ST 20 SQ CM OR < CD-10 Diagnosis Description S21.001S Unspecified open wound of right breast, sequela °Modifier: °Quantity: 1 °Physician Procedures °: CPT4 Code Description Modifier [id number] WC PHYS LEVEL 3 NEW PT 25 ICD-10 Diagnosis Description S21.001S Unspecified open wound of right breast, sequela L98.498 Non-pressure chronic ulcer of skin of other sites with other specified severity °Quantity: 1 °: [id number] - WC PHYS DEBR WO ANESTH 20 SQ CM ICD-10 Diagnosis Description S21.001S Unspecified open wound of right breast, sequela °Quantity: 1 °Electronic Signature(s) °Signed: 06/20/2021 4:28:46 PM By: ,  MD °Signed: 06/20/2021 5:37:15 PM By: Lynch, Shatara RN, BSN °Entered By: Lynch, Shatara on 06/20/2021 11:55:05 °

## 2021-06-20 NOTE — Progress Notes (Signed)
[name], [name] ([id number]) Visit Report for 06/20/2021 Abuse Risk Screen Details Patient Name: Date of Service: Erika Boyer [place] URE 06/20/2021 9:00 [place] Record Number: [id number] Patient Account Number: [id number] Date of Birth/Sex: Treating RN: [birth year] (26 y.o. Erika Boyer Primary Care Zakk Borgen: Asencion Noble Other Clinician: Referring [name]: Treating Haiden Clucas/Extender: Maxwell Marion in Treatment: 0 Abuse Risk Screen Items Answer ABUSE RISK SCREEN: Has anyone close to you tried to hurt or harm you recentlyo No Do you feel uncomfortable with anyone in your familyo No Has anyone forced you do things that you didnt want to doo No Electronic Signature(s) Signed: 06/20/2021 5:37:15 PM By: Levan Hurst RN, BSN Entered By: Levan Hurst on 06/20/2021 09:23:14 -------------------------------------------------------------------------------- Activities of Daily Living Details Patient Name: Date of Service: Erika Drown MA URE 06/20/2021 9:00 [place] Record Number: [id number] Patient Account Number: [id number] Date of Birth/Sex: Treating RN: Mar 24, 1996 (26 y.o. Erika Boyer Primary Care Zaylin Runco: Asencion Noble Other Clinician: Referring Larance Ratledge: Treating [name]/Extender: Maxwell Marion in Treatment: 0 Activities of Daily Living Items Answer Activities of Daily Living (Please select one for each item) Drive Automobile Not Able T Medications ake Completely Able Use T elephone Completely Able Care for Appearance Completely Able Use T oilet Completely Able Bath / Shower Completely Able Dress Self Completely Able Feed Self Completely Able Walk Completely Able Get In / Out Bed Completely Able Housework Completely Able Prepare Meals Completely Able Handle Money Completely Able Shop for Self Need Assistance Electronic Signature(s) Signed: 06/20/2021 5:37:15 PM By: Levan Hurst RN, BSN Entered By:  Levan Hurst on 06/20/2021 09:23:36 -------------------------------------------------------------------------------- Education Screening Details Patient Name: Date of Service: Erika Drown MA URE 06/20/2021 9:00 A M Medical Record Number: [id number] Patient Account Number: [id number] Date of Birth/Sex: Treating RN: May 26, 1995 (26 y.o. Erika Boyer Primary Care Virlan Kempker: Asencion Noble Other Clinician: Referring Hermenegildo Clausen: Treating Thao Bauza/Extender: Maxwell Marion in Treatment: 0 Primary Learner Assessed: Patient Learning Preferences/Education Level/Primary Language Learning Preference: Explanation, Demonstration, Printed Material Highest Education Level: College or Above Preferred Language: English Cognitive Barrier Language Barrier: No Translator Needed: No Memory Deficit: No Emotional Barrier: No Cultural/Religious Beliefs Affecting Medical Care: No Physical Barrier Impaired Vision: No Impaired Hearing: No Decreased Hand dexterity: No Knowledge/Comprehension Knowledge Level: High Comprehension Level: High Ability to understand written instructions: High Ability to understand verbal instructions: High Motivation Anxiety Level: Calm Cooperation: Cooperative Education Importance: Acknowledges Need Interest in Health Problems: Asks Questions Perception: Coherent Willingness to Engage in Self-Management High Activities: Readiness to Engage in Self-Management High Activities: Electronic Signature(s) Signed: 06/20/2021 5:37:15 PM By: Levan Hurst RN, BSN Entered By: Levan Hurst on 06/20/2021 09:23:55 -------------------------------------------------------------------------------- Fall Risk Assessment Details Patient Name: Date of Service: Erika Boyer, Erika Downer MA URE 06/20/2021 9:00 A M Medical Record Number: [id number] Patient Account Number: [id number] Date of Birth/Sex: Treating RN: May 18, 1996 (26 y.o. Erika Boyer Primary Care  [name]: Asencion Noble Other Clinician: Referring [name]: Treating [name]/Extender: Maxwell Marion in Treatment: 0 Fall Risk Assessment Items Have you had 2 or more falls in the last 12 monthso 0 No Have you had any fall that resulted in injury in the last 12 monthso 0 No FALLS RISK SCREEN History of falling - immediate or within 3 months 0 No Secondary diagnosis (Do you have 2 or more medical diagnoseso) 15 Yes Ambulatory aid None/bed rest/wheelchair/nurse 0 No Crutches/cane/walker 0 No Furniture 0 No Intravenous therapy Access/Saline/Heparin Lock 0  No Gait/Transferring Normal/ bed rest/ wheelchair 0 Yes Weak (short steps with or without shuffle, stooped but able to lift head while walking, may seek 0 No support from furniture) Impaired (short steps with shuffle, may have difficulty arising from chair, head down, impaired 0 No balance) Mental Status Oriented to own ability 0 Yes Electronic Signature(s) Signed: 06/20/2021 5:37:15 PM By: Levan Hurst RN, BSN Entered By: Levan Hurst on 06/20/2021 09:24:08 -------------------------------------------------------------------------------- Nutrition Risk Screening Details Patient Name: Date of Service: Erika Drown MA URE 06/20/2021 9:00 [place] Record Number: [id number] Patient Account Number: [id number] Date of Birth/Sex: Treating RN: 03-09-1996 (26 y.o. Erika Boyer Primary Care [name]: Asencion Noble Other Clinician: Referring [name]: Treating [name]/Extender: Maxwell Marion in Treatment: 0 Height (in): 65 Weight (lbs): 330 Body Mass Index (BMI): 54.9 Nutrition Risk Screening Items Score Screening NUTRITION RISK SCREEN: Boyer have an illness or condition that made me change the kind and/or amount of food Boyer eat 0 No Boyer eat fewer than two meals per day 0 No Boyer eat few fruits and vegetables, or milk products 0 No Boyer have three or more drinks of beer, liquor  or wine almost every day 0 No Boyer have tooth or mouth problems that make it hard for me to eat 0 No Boyer don't always have enough money to buy the food Boyer need 0 No Boyer eat alone most of the time 0 No Boyer take three or more different prescribed or over-the-counter drugs a day 1 Yes Without wanting to, Boyer have lost or gained 10 pounds in the last six months 0 No Boyer am not always physically able to shop, cook and/or feed myself 0 No Nutrition Protocols Good Risk Protocol Moderate Risk Protocol 0 Provide education on nutrition High Risk Proctocol Risk Level: Good Risk Score: 1 Electronic Signature(s) Signed: 06/20/2021 5:37:15 PM By: Levan Hurst RN, BSN Entered By: Levan Hurst on 06/20/2021 09:24:30

## 2021-06-21 DIAGNOSIS — S21001A Unspecified open wound of right breast, initial encounter: Secondary | ICD-10-CM | POA: Diagnosis not present

## 2021-06-21 NOTE — Progress Notes (Signed)
BREAN, CARBERRY ([id number]) Visit Report for 06/20/2021 Allergy List Details Patient Name: Date of Service: Erika Boyer [place] U[facility] 06/20/2021 9:00 [place] Record Number: [id number] Patient Account Number: [id number] Date of Birth/Sex: Treating RN: [birth year] (26 y.o. [name] Primary Care Female Minish: Asencion Noble Other Clinician: Referring Deardra Hinkley: Treating Bralynn Velador/Extender: Maxwell Marion in Treatment: 0 Allergies Active Allergies morphine mushroom methimazole Allergy Notes Electronic Signature(s) Signed: 06/20/2021 5:37:15 PM By: Levan Hurst RN, BSN Entered By: Levan Hurst on 06/20/2021 09:18:43 -------------------------------------------------------------------------------- Arrival Information Details Patient Name: Date of Service: Erika Drown MA URE 06/20/2021 9:00 A M Medical Record Number: [id number] Patient Account Number: [id number] Date of Birth/Sex: Treating RN: [birth year] (26 y.o. [name] Primary Care Sergei Delo: Asencion Noble Other Clinician: Referring Jancie Kercher: Treating Vitali Seibert/Extender: Maxwell Marion in Treatment: 0 Visit Information Patient Arrived: Ambulatory Arrival Time: 09:13 Accompanied By: alone Transfer Assistance: None Patient Identification Verified: Yes Secondary Verification Process Completed: Yes Patient Requires Transmission-Based Precautions: No Patient Has Alerts: No Electronic Signature(s) Signed: 06/20/2021 5:37:15 PM By: Levan Hurst RN, BSN Entered By: Levan Hurst on 06/20/2021 09:13:23 -------------------------------------------------------------------------------- Clinic Level of Care Assessment Details Patient Name: Date of Service: Erika Drown MA URE 06/20/2021 9:00 A M Medical Record Number: [id number] Patient Account Number: [id number] Date of Birth/Sex: Treating RN: 1996-04-10 (26 y.o. [name] Primary Care Jaheem Hedgepath: Asencion Noble Other  Clinician: Referring Lyly Canizales: Treating Audie Wieser/Extender: Maxwell Marion in Treatment: 0 Clinic Level of Care Assessment Items TOOL 1 Quantity Score X- 1 0 Use when EandM and Procedure is performed on INITIAL visit ASSESSMENTS - Nursing Assessment / Reassessment X- 1 20 General Physical Exam (combine w/ comprehensive assessment (listed just below) when performed on new pt. evals) X- 1 25 Comprehensive Assessment (HX, ROS, Risk Assessments, Wounds Hx, etc.) ASSESSMENTS - Wound and Skin Assessment / Reassessment _0  - 0 Dermatologic / Skin Assessment (not related to wound area) ASSESSMENTS - Ostomy and/or Continence Assessment and Care _1  - 0 Incontinence Assessment and Management _2  - 0 Ostomy Care Assessment and Management (repouching, etc.) PROCESS - Coordination of Care X - Simple Patient / Family Education for ongoing care 1 15 _3  - 0 Complex (extensive) Patient / Family Education for ongoing care X- 1 10 Staff obtains Programmer, systems, Records, T Results / Process Orders est _4  - 0 Staff telephones HHA, Nursing Homes / Clarify orders / etc _5  - 0 Routine Transfer to another Facility (non-emergent condition) _6  - 0 Routine Hospital Admission (non-emergent condition) X- 1 15 New Admissions / Biomedical engineer / Ordering NPWT Apligraf, etc. , _7  - 0 Emergency Hospital Admission (emergent condition) PROCESS - Special Needs _8  - 0 Pediatric / Minor Patient Management _9  - 0 Isolation Patient Management _10  - 0 Hearing / Language / Visual special needs _11  - 0 Assessment of Community assistance (transportation, D/C planning, etc.) _12  - 0 Additional assistance / Altered mentation _13  - 0 Support Surface(s) Assessment (bed, cushion, seat, etc.) INTERVENTIONS - Miscellaneous _14  - 0 External ear exam _15  - 0 Patient Transfer (multiple staff / Civil Service fast streamer / Similar devices) _16  - 0 Simple Staple / Suture removal (25 or less) _17  - 0 Complex Staple  / Suture removal (26 or more) _18  - 0 Hypo/Hyperglycemic Management (do not check if billed separately) _19  - 0 Ankle / Brachial Index (ABI) - do not check if billed separately Has the patient been seen at the hospital within the last three years: Yes Total  Score: 85 Level Of Care: New/Established - Level 3 Electronic Signature(s) Signed: 06/20/2021 5:37:15 PM By: Levan Hurst RN, BSN Signed: 06/20/2021 5:37:15 PM By: Levan Hurst RN, BSN Entered By: Levan Hurst on 06/20/2021 11:54:53 -------------------------------------------------------------------------------- Encounter Discharge Information Details Patient Name: Date of Service: Erika Drown MA URE 06/20/2021 9:00 [place] Record Number: [id number] Patient Account Number: [id number] Date of Birth/Sex: Treating RN: 1996/04/30 (26 y.o. [name] Primary Care Kimsey Demaree: Asencion Noble Other Clinician: Referring Donnie Gedeon: Treating Francella Barnett/Extender: Maxwell Marion in Treatment: 0 Encounter Discharge Information Items Post Procedure Vitals Discharge Condition: Stable Temperature (F): 98.8 Ambulatory Status: Ambulatory Pulse (bpm): 89 Discharge Destination: Home Respiratory Rate (breaths/min): 18 Transportation: Private Auto Blood Pressure (mmHg): 134/85 Accompanied By: alone Schedule Follow-up Appointment: Yes Clinical Summary of Care: Patient Declined Electronic Signature(s) Signed: 06/20/2021 5:37:15 PM By: Levan Hurst RN, BSN Entered By: Levan Hurst on 06/20/2021 11:56:20 -------------------------------------------------------------------------------- Multi Wound Chart Details Patient Name: Date of Service: Erika Drown MA URE 06/20/2021 9:00 A M Medical Record Number: [id number] Patient Account Number: [id number] Date of Birth/Sex: Treating RN: January 21, 1996 (26 y.o. [name] Primary Care Franklin Clapsaddle: Asencion Noble Other Clinician: Referring Lena Gores: Treating  Weslee Prestage/Extender: Maxwell Marion in Treatment: 0 Vital Signs Height(in): 65 Pulse(bpm): 89 Weight(lbs): 330 Blood Pressure(mmHg): 134/85 Body Mass Index(BMI): 54.9 Temperature(F): 98.8 Respiratory Rate(breaths/min): 18 Photos: [N/A:N/A] Right Breast N/A N/A Wound Location: Gradually Appeared N/A N/A Wounding Event: Atypical N/A N/A Primary Etiology: Lupus Erythematosus, Neuropathy N/A N/A Comorbid History: 03/30/2021 N/A N/A Date Acquired: 0 N/A N/A Weeks of Treatment: Open N/A N/A Wound Status: No N/A N/A Wound Recurrence: 1.4x1.6x0.1 N/A N/A Measurements L x W x D (cm) 1.759 N/A N/A A (cm) : rea 0.176 N/A N/A Volume (cm) : 0.00% N/A N/A % Reduction in Area: 0.00% N/A N/A % Reduction in Volume: Full Thickness Without Exposed N/A N/A Classification: Support Structures Medium N/A N/A Exudate A mount: Serosanguineous N/A N/A Exudate Type: red, brown N/A N/A Exudate Color: Flat and Intact N/A N/A Wound Margin: Large (67-100%) N/A N/A Granulation A mount: Red, Pink N/A N/A Granulation Quality: None Present (0%) N/A N/A Necrotic A mount: Fat Layer (Subcutaneous Tissue): Yes N/A N/A Exposed Structures: Fascia: No Tendon: No Muscle: No Joint: No Bone: No None N/A N/A Epithelialization: Debridement - Selective/Open Wound N/A N/A Debridement: Pre-procedure Verification/Time Out 09:44 N/A N/A Taken: Skin/Epidermis N/A N/A Level: 2.24 N/A N/A Debridement A (sq cm): rea Curette N/A N/A Instrument: Minimum N/A N/A Bleeding: Pressure N/A N/A Hemostasis Achieved: 0 N/A N/A Procedural Pain: 0 N/A N/A Post Procedural Pain: Procedure was tolerated well N/A N/A Debridement Treatment Response: 1.4x1.6x0.1 N/A N/A Post Debridement Measurements L x W x D (cm) 0.176 N/A N/A Post Debridement Volume: (cm) Debridement N/A N/A Procedures Performed: Treatment Notes Electronic Signature(s) Signed: 06/20/2021 4:28:46 PM By:  Linton Ham MD Signed: 06/20/2021 5:37:15 PM By: Levan Hurst RN, BSN Entered By: Linton Ham on 06/20/2021 10:37:25 -------------------------------------------------------------------------------- Multi-Disciplinary Care Plan Details Patient Name: Date of Service: Erika Drown MA URE 06/20/2021 9:00 A M Medical Record Number: [id number] Patient Account Number: [id number] Date of Birth/Sex: Treating RN: 07-22-1995 (26 y.o. [name] Primary Care Amia Rynders: Asencion Noble Other Clinician: Referring Emmilynn Marut: Treating Mariaeduarda Defranco/Extender: Maxwell Marion in Treatment: 0 Multidisciplinary Care Plan reviewed with physician Active Inactive Wound/Skin Impairment Nursing Diagnoses: Impaired tissue integrity Goals: Patient/caregiver will verbalize understanding of skin care regimen Date Initiated: 06/20/2021 Target Resolution Date: 07/21/2021 Goal  Status: Active Ulcer/skin breakdown will have a volume reduction of 30% by week 4 Date Initiated: 06/20/2021 Target Resolution Date: 07/21/2021 Goal Status: Active Interventions: Assess patient/caregiver ability to obtain necessary supplies Assess patient/caregiver ability to perform ulcer/skin care regimen upon admission and as needed Assess ulceration(s) every visit Provide education on ulcer and skin care Notes: Electronic Signature(s) Signed: 06/20/2021 5:37:15 PM By: Levan Hurst RN, BSN Entered By: Levan Hurst on 06/20/2021 11:53:32 -------------------------------------------------------------------------------- Pain Assessment Details Patient Name: Date of Service: Erika Drown MA URE 06/20/2021 9:00 [place] Record Number: [id number] Patient Account Number: [id number] Date of Birth/Sex: Treating RN: 09/03/1995 (26 y.o. [name] Primary Care Tesslyn Baumert: Asencion Noble Other Clinician: Referring Dallana Mavity: Treating Izeyah Deike/Extender: Maxwell Marion in  Treatment: 0 Active Problems Location of Pain Severity and Description of Pain Patient Has Paino No Site Locations Pain Management and Medication Current Pain Management: Electronic Signature(s) Signed: 06/20/2021 5:37:15 PM By: Levan Hurst RN, BSN Entered By: Levan Hurst on 06/20/2021 09:25:34 -------------------------------------------------------------------------------- Patient/Caregiver Education Details Patient Name: Date of Service: Erika Drown MA URE 1/31/2023andnbsp9:00 A M Medical Record Number: [id number] Patient Account Number: [id number] Date of Birth/Gender: Treating RN: 1995/06/02 (26 y.o. [name] Primary Care Physician: Asencion Noble Other Clinician: Referring Physician: Treating Physician/Extender: Maxwell Marion in Treatment: 0 Education Assessment Education Provided To: Patient Education Topics Provided Wound/Skin Impairment: Methods: Explain/Verbal Responses: State content correctly Electronic Signature(s) Signed: 06/20/2021 5:37:15 PM By: Levan Hurst RN, BSN Entered By: Levan Hurst on 06/20/2021 11:54:11 -------------------------------------------------------------------------------- Wound Assessment Details Patient Name: Date of Service: Erika Drown MA URE 06/20/2021 9:00 A M Medical Record Number: [id number] Patient Account Number: [id number] Date of Birth/Sex: Treating RN: Apr 21, 1996 (26 y.o. Tonita Phoenix, Lauren Primary Care Myna Freimark: Asencion Noble Other Clinician: Referring Zharia Conrow: Treating Maudry Zeidan/Extender: Maxwell Marion in Treatment: 0 Wound Status Wound Number: 1 Primary Etiology: Atypical Wound Location: Right Breast Wound Status: Open Wounding Event: Gradually Appeared Comorbid History: Lupus Erythematosus, Neuropathy Date Acquired: 03/30/2021 Weeks Of Treatment: 0 Clustered Wound: No Photos Wound Measurements Length: (cm) 1.4 Width: (cm) 1.6 Depth:  (cm) 0.1 Area: (cm) 1.759 Volume: (cm) 0.176 % Reduction in Area: 0% % Reduction in Volume: 0% Epithelialization: None Tunneling: No Undermining: No Wound Description Classification: Full Thickness Without Exposed Support Structures Wound Margin: Flat and Intact Exudate Amount: Medium Exudate Type: Serosanguineous Exudate Color: red, brown Wound Bed Granulation Amount: Large (67-100%) Granulation Quality: Red, Pink Necrotic Amount: None Present (0%) Foul Odor After Cleansing: No Slough/Fibrino No Exposed Structure Fascia Exposed: No Fat Layer (Subcutaneous Tissue) Exposed: Yes Tendon Exposed: No Muscle Exposed: No Joint Exposed: No Bone Exposed: No Treatment Notes Wound #1 (Breast) Wound Laterality: Right Cleanser Soap and Water Discharge Instruction: May shower and wash wound with dial antibacterial soap and water prior to dressing change. [place] Ancillary Kit - 15 Day Supply Discharge Instruction: Use supplies as instructed; Kit contains: (15) Saline Bullets; (15) 3x3 Gauze; 15 pr Gloves Peri-Wound Care Topical Primary Dressing FIBRACOL Plus Dressing, 2x2 in (collagen) Discharge Instruction: Moisten collagen with saline or hydrogel Secondary Dressing Bordered Gauze, 4x4 in Discharge Instruction: Apply over primary dressing as directed. Secured With Compression Wrap Compression Stockings Environmental education officer) Signed: 06/20/2021 5:37:15 PM By: Levan Hurst RN, BSN Signed: 06/21/2021 4:37:24 PM By: Rhae Hammock RN Entered By: Levan Hurst on 06/20/2021 09:25:23 -------------------------------------------------------------------------------- Vitals Details Patient Name: Date of Service: [name], [name] MA URE 06/20/2021 9:00 A M Medical Record Number:  [id number] Patient Account Number: [id number] Date of Birth/Sex: Treating RN: 1995/11/12 (26 y.o. [name] Primary Care Geoff Dacanay: Asencion Noble Other Clinician: Referring  Nicola Quesnell: Treating Dyan Creelman/Extender: Maxwell Marion in Treatment: 0 Vital Signs Time Taken: 09:13 Temperature (F): 98.8 Height (in): 65 Pulse (bpm): 89 Source: Stated Respiratory Rate (breaths/min): 18 Weight (lbs): 330 Blood Pressure (mmHg): 134/85 Source: Stated Reference Range: 80 - 120 mg / dl Body Mass Index (BMI): 54.9 Electronic Signature(s) Signed: 06/20/2021 5:37:15 PM By: Levan Hurst RN, BSN Entered By: Levan Hurst on 06/20/2021 09:14:12

## 2021-06-26 ENCOUNTER — Other Ambulatory Visit: Payer: Self-pay

## 2021-06-26 ENCOUNTER — Encounter: Payer: Medicare HMO | Attending: Surgery | Admitting: Skilled Nursing Facility1

## 2021-06-26 DIAGNOSIS — Z713 Dietary counseling and surveillance: Secondary | ICD-10-CM | POA: Diagnosis not present

## 2021-06-26 DIAGNOSIS — Z6841 Body Mass Index (BMI) 40.0 and over, adult: Secondary | ICD-10-CM | POA: Diagnosis not present

## 2021-06-26 NOTE — Progress Notes (Signed)
Supervised Weight Loss Visit Bariatric Nutrition Education  Planned Surgery: sleeve gastrectomy     1 out of 6 SWL Appointments    NUTRITION ASSESSMENT  Anthropometrics  Start weight at [facility]: 350.7 lbs (date: 06/14/2021)  Weight: 346.8 BMI: 57.71 kg/m2     Clinical  Medical hx: Graves, cerebellar degeneration, Lupus Medications: see list  Labs: none in EMR Notable signs/symptoms:  Any previous deficiencies? Yes: b12  Lifestyle & Dietary Hx  Pt states she Wakes feeling exhausted and coffee and tea makes it worse because it makes her really nervous. Pt states her muscles get fatigueed and is not stable sometimes.  Pt states Sugar worsens her balance.  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    Typically wakes around 12.   Pt states she has done PT but feels she did not get anything out of it.   Pt states she makes her veggies with seasoning, pt states she does not like to cook.  Pt states she forgets what she is supposed to be doing. Pt states she bought yogurt to eat in the morning but she keeps forgetting to eat it.  Pt states she Bought the wrong knife so has been cutting herself when cutting her cabbage.   Pt asks when will she be getting surgery: Dietitian advised she needed to finish her appts with the dietitian and be cleared by the psychologist.   Dietitian advised pt to work with a therapist but pt continues to not be open to this.   Estimated daily fluid intake: unknown oz Supplements:  Current average weekly physical activity: ADL's  24-Hr Dietary Recall First Meal:  Snack:  Second Meal 12: 8 crab rangoons  Snack: fried mazzarella sticks + cheerwine  Third Meal: beef stew + rice + corn bread Snack:  Beverages: water, cheerwine,   Estimated Energy Needs Calories: 1600   NUTRITION DIAGNOSIS  Overweight/obesity ([place]-3.3) related to past poor dietary habits and physical inactivity as evidenced by patient w/ planned  sleeve gastrectomy surgery following dietary guidelines for continued weight loss.   NUTRITION INTERVENTION  Nutrition counseling (C-1) and education (E-2) to facilitate bariatric surgery goals.  Pre-Op Goals Progress & New Goals Continue: Think about working with a mental health professional Continue: Aim for a structured day of eating NEW: aim to eat 1 servings of non starchy vegetables per day with dinner (other than pepper and onion) 7 days a week NEW: set an alarm to remind you to eat breakfast every day  NEW: try the pre cut cabbage so you do not cut yourself  Handouts Provided Include    Learning Style & Readiness for Change Teaching method utilized: Visual & Auditory  Demonstrated degree of understanding via: Teach Back  Readiness Level: pre-contemplative  Barriers to learning/adherence to lifestyle change: ETOH/cerebellar degeneration   RD's Notes for next Visit  Assess pts adherence to chosen goals   MONITORING & EVALUATION Dietary intake, weekly physical activity, body weight, and pre-op goals in 1 month.   Next Steps  Patient is to return to [facility] in 1 month: pt to call office to make follow up appt tomorrow pt agreeable to plan

## 2021-06-27 ENCOUNTER — Encounter (HOSPITAL_BASED_OUTPATIENT_CLINIC_OR_DEPARTMENT_OTHER): Payer: Medicare HMO | Admitting: Internal Medicine

## 2021-07-04 ENCOUNTER — Encounter (HOSPITAL_BASED_OUTPATIENT_CLINIC_OR_DEPARTMENT_OTHER): Payer: Medicare HMO | Admitting: Internal Medicine

## 2021-07-04 ENCOUNTER — Encounter (HOSPITAL_BASED_OUTPATIENT_CLINIC_OR_DEPARTMENT_OTHER): Payer: Medicare HMO | Attending: Internal Medicine | Admitting: Internal Medicine

## 2021-07-04 ENCOUNTER — Other Ambulatory Visit: Payer: Self-pay

## 2021-07-04 DIAGNOSIS — Z6841 Body Mass Index (BMI) 40.0 and over, adult: Secondary | ICD-10-CM | POA: Insufficient documentation

## 2021-07-04 DIAGNOSIS — L98498 Non-pressure chronic ulcer of skin of other sites with other specified severity: Secondary | ICD-10-CM | POA: Insufficient documentation

## 2021-07-04 DIAGNOSIS — X58XXXS Exposure to other specified factors, sequela: Secondary | ICD-10-CM | POA: Diagnosis not present

## 2021-07-04 DIAGNOSIS — S21001A Unspecified open wound of right breast, initial encounter: Secondary | ICD-10-CM | POA: Insufficient documentation

## 2021-07-04 DIAGNOSIS — S21001S Unspecified open wound of right breast, sequela: Secondary | ICD-10-CM | POA: Diagnosis present

## 2021-07-05 NOTE — Progress Notes (Signed)
RIELY, OETKEN ([id number]) Visit Report for 07/04/2021 HPI Details Patient Name: Date of Service: Erika Boyer [place] [facility] 07/04/2021 3:00 PM Medical Record Number: [id number] Patient Account Number: [id number] Date of Birth/Sex: Treating RN: Sep 29, 1995 (26 y.o. [name] Primary Care Provider: Asencion Noble Other Clinician: Referring Provider: Treating Provider/Extender: Maxwell Marion in Treatment: 2 History of Present Illness HPI Description: ADMISSION 06/20/2021 This is a 26 year old woman who tells me that she had a reduction mammoplasty at age 74. This was done in [place]. Ever since then she has had areas on the inferior aspects of both breasts that will open and close spontaneously. She is here for 1 of these wounds on the inferior part of her right breast at roughly 6:00 that has not healed in 2 to 3 months. She has been using Neosporin topically. The patient has morbid obesity, she also has SLE on hydroxychloroquine. 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 She does have a history of pericardial effusions and pleural effusions presumably related to her lupus although I am not sure about that. 2/14; the patient used the collagen for about 3 days stated she felt it made it bigger. Since then she has been using an over-the-counter cream. This is on the undersurface of her breast at roughly 6:00. It looks benign somewhat dry looking. There is no subcutaneous involvement. She does have healed wounds around this they were apparently similar. In between her breasts a small nodular areas. There is nothing to suggest hidradenitis Electronic Signature(s) Signed: 07/04/2021 4:15:21 PM By: Linton Ham MD Entered By: Linton Ham on 07/04/2021 15:22:29 -------------------------------------------------------------------------------- Physical Exam Details Patient Name: Date of Service: Erika Drown [place] URE 07/04/2021 3:00 PM Medical Record Number:  [id number] Patient Account Number: [id number] Date of Birth/Sex: Treating RN: 07-26-1995 (26 y.o. [name] Primary Care Provider: Asencion Noble Other Clinician: Referring Provider: Treating Provider/Extender: Maxwell Marion in Treatment: 2 Constitutional Sitting or standing Blood Pressure is within target range for patient.. Pulse regular and within target range for patient.Marland Kitchen Respirations regular, non-labored and within target range.. Temperature is normal and within the target range for the patient.Marland Kitchen Appears in no distress. Notes Wound exam; the area it looks completely the same as last time. Somewhat dry looking surface looks otherwise benign. There is no subcutaneous nodular involvement here. There is some healed areas here today appear scarred. Electronic Signature(s) Signed: 07/04/2021 4:15:21 PM By: Linton Ham MD Entered By: Linton Ham on 07/04/2021 15:23:20 -------------------------------------------------------------------------------- Physician Orders Details Patient Name: Date of Service: Erika Drown MA URE 07/04/2021 3:00 PM Medical Record Number: [id number] Patient Account Number: [id number] Date of Birth/Sex: Treating RN: 1995-12-26 (26 y.o. [name] Primary Care Provider: Asencion Noble Other Clinician: Referring Provider: Treating Provider/Extender: Maxwell Marion in Treatment: 2 Verbal / Phone Orders: No Diagnosis Coding ICD-10 Coding Code Description S21.001S Unspecified open wound of right breast, sequela L98.498 Non-pressure chronic ulcer of skin of other sites with other specified severity Follow-up Appointments ppointment in 2 weeks. - Dr. Dellia Nims Return A Bathing/ Shower/ Hygiene May shower with protection but do not get wound dressing(s) wet. Wound Treatment Wound #1 - Breast Wound Laterality: Right Cleanser: Soap and Water Every Other Day/15 Days Discharge Instructions: May  shower and wash wound with dial antibacterial soap and water prior to dressing change. Cleanser: Byram Ancillary Kit - 15 Day Supply (Generic) Every Other Day/15 Days Discharge Instructions: Use supplies as instructed; Kit contains: (15) Saline Bullets; (  15) 3x3 Gauze; 15 pr Gloves Prim Dressing: Hydrofera Blue Ready Foam, 2.5 x2.5 in Every Other Day/15 Days ary Discharge Instructions: Apply to wound bed as instructed Secondary Dressing: Bordered Gauze, 4x4 in (Generic) Every Other Day/15 Days Discharge Instructions: Apply over primary dressing as directed. Consults Dermatology - Dr. Denna Haggard - Atypical, recurrent ulcers on breasts - (ICD10 S21.001S - Unspecified open wound of right breast, sequela) Electronic Signature(s) Signed: 07/04/2021 4:15:21 PM By: Linton Ham MD Signed: 07/04/2021 5:34:46 PM By: Levan Hurst RN, BSN Entered By: Levan Hurst on 07/04/2021 15:23:14 Prescription 07/04/2021 -------------------------------------------------------------------------------- Gerlean Ren MD Patient Name: Provider: 1996-01-10 [id number] Date of Birth: NPI#Jesse Sans Sex: DEA #: [phone] [id number] Phone #: License #: [place] Patient Address: [address] [place] [address] [place], [place] [address] [place], [place] [address] [phone] Allergies morphine; mushroom; methimazole Provider's Orders Dermatology - Dr. Denna Haggard - ICD10: S21.001S - Atypical, recurrent ulcers on breasts Hand Signature: Date(s): Electronic Signature(s) Signed: 07/04/2021 4:15:21 PM By: Linton Ham MD Signed: 07/04/2021 5:34:46 PM By: Levan Hurst RN, BSN Entered By: Levan Hurst on 07/04/2021 15:23:15 -------------------------------------------------------------------------------- Problem List Details Patient Name: Date of Service: Erika Drown MA URE 07/04/2021 3:00 PM Medical Record Number:  [id number] Patient Account Number: [id number] Date of Birth/Sex: Treating RN: 1995-09-27 (26 y.o. [name] Primary Care Provider: Asencion Noble Other Clinician: Referring Provider: Treating Provider/Extender: Maxwell Marion in Treatment: 2 Active Problems ICD-10 Encounter Code Description Active Date MDM Diagnosis S21.001S Unspecified open wound of right breast, sequela 06/20/2021 No Yes L98.498 Non-pressure chronic ulcer of skin of other sites with other specified severity 06/20/2021 No Yes Inactive Problems Resolved Problems Electronic Signature(s) Signed: 07/04/2021 4:15:21 PM By: Linton Ham MD Entered By: Linton Ham on 07/04/2021 15:21:12 -------------------------------------------------------------------------------- Progress Note Details Patient Name: Date of Service: Erika Drown MA URE 07/04/2021 3:00 PM Medical Record Number: [id number] Patient Account Number: [id number] Date of Birth/Sex: Treating RN: 05-17-1996 (26 y.o. [name] Primary Care Provider: Asencion Noble Other Clinician: Referring Provider: Treating Provider/Extender: Maxwell Marion in Treatment: 2 Subjective History of Present Illness (HPI) ADMISSION 06/20/2021 This is a 26 year old woman who tells me that she had a reduction mammoplasty at age 40. This was done in [place]. Ever since then she has had areas on the inferior aspects of both breasts that will open and close spontaneously. She is here for 1 of these wounds on the inferior part of her right breast at roughly 6:00 that has not healed in 2 to 3 months. She has been using Neosporin topically. The patient has morbid obesity, she also has SLE on hydroxychloroquine. 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 She does have a history of pericardial effusions and pleural effusions presumably related to her lupus although I am not sure about that. 2/14; the patient used  the collagen for about 3 days stated she felt it made it bigger. Since then she has been using an over-the-counter cream. This is on the undersurface of her breast at roughly 6:00. It looks benign somewhat dry looking. There is no subcutaneous involvement. She does have healed wounds around this they were apparently similar. In between her breasts a small nodular areas. There is nothing to suggest hidradenitis Objective Constitutional Sitting or standing Blood Pressure is within target range for patient.. Pulse regular and within target range for patient.Marland Kitchen Respirations regular, non-labored and within target range.. Temperature  is normal and within the target range for the patient.Marland Kitchen Appears in no distress. Vitals Time Taken: 3:02 PM, Height: 65 in, Weight: 330 lbs, BMI: 54.9, Temperature: 98.4 F, Pulse: 84 bpm, Respiratory Rate: 16 breaths/min, Blood Pressure: 118/87 mmHg. General Notes: Wound exam; the area it looks completely the same as last time. Somewhat dry looking surface looks otherwise benign. There is no subcutaneous nodular involvement here. There is some healed areas here today appear scarred. Integumentary (Hair, Skin) Wound #1 status is Open. Original cause of wound was Gradually Appeared. The date acquired was: 03/30/2021. The wound has been in treatment 2 weeks. The wound is located on the Right Breast. The wound measures 1.5cm length x 1.4cm width x 0.1cm depth; 1.649cm^2 area and 0.165cm^3 volume. There is Fat Layer (Subcutaneous Tissue) exposed. There is no tunneling or undermining noted. There is a medium amount of serosanguineous drainage noted. The wound margin is flat and intact. There is large (67-100%) red, pink granulation within the wound bed. There is no necrotic tissue within the wound bed. Assessment Active Problems ICD-10 Unspecified open wound of right breast, sequela Non-pressure chronic ulcer of skin of other sites with other specified severity Plan Follow-up  Appointments: Return Appointment in 2 weeks. - Dr. Dellia Nims Bathing/ Shower/ Hygiene: May shower with protection but do not get wound dressing(s) wet. Consults ordered were: Dermatology - Dr. Denna Haggard - Atypical, recurrent ulcers on breasts WOUND #1: - Breast Wound Laterality: Right Cleanser: Soap and Water Every Other Day/15 Days Discharge Instructions: May shower and wash wound with dial antibacterial soap and water prior to dressing change. Cleanser: Byram Ancillary Kit - 15 Day Supply (Generic) Every Other Day/15 Days Discharge Instructions: Use supplies as instructed; Kit contains: (15) Saline Bullets; (15) 3x3 Gauze; 15 pr Gloves Prim Dressing: Hydrofera Blue Ready Foam, 2.5 x2.5 in Every Other Day/15 Days ary Discharge Instructions: Apply to wound bed as instructed Secondary Dressing: Bordered Gauze, 4x4 in (Generic) Every Other Day/15 Days Discharge Instructions: Apply over primary dressing as directed. 1. Initially I felt this might be friction in the setting of large pendulous breasts however she states that she does not wear a bra or other tight clothing at home. 2. Healed scarred areas around this. I am going to have the dermatology look at this. I do not think this has anything to do with her lupus 3. She states the collagen made this worse I doubt that however admittedly is certainly no better ongoing use Hydrofera Blue and covering foam Electronic Signature(s) Signed: 07/04/2021 4:15:21 PM By: Linton Ham MD Entered By: Linton Ham on 07/04/2021 15:26:53 -------------------------------------------------------------------------------- SuperBill Details Patient Name: Date of Service: Erika Drown MA URE 07/04/2021 Medical Record Number: [id number] Patient Account Number: [id number] Date of Birth/Sex: Treating RN: 19-Feb-1996 (26 y.o. [name] Primary Care Provider: Asencion Noble Other Clinician: Referring Provider: Treating Provider/Extender: Maxwell Marion in Treatment: 2 Diagnosis Coding ICD-10 Codes Code Description J50.093G Unspecified open wound of right breast, sequela L98.498 Non-pressure chronic ulcer of skin of other sites with other specified severity Facility Procedures CPT4 Code: [id number] Description: 99213 - WOUND CARE VISIT-LEV 3 EST PT Modifier: Quantity: 1 Physician Procedures : CPT4 Code Description Modifier [id number] - WC PHYS LEVEL 3 - EST PT ICD-10 Diagnosis Description S21.001S Unspecified open wound of right breast, sequela L98.498 Non-pressure chronic ulcer of skin of other sites with other specified severity Quantity: 1 Electronic Signature(s) Signed: 07/04/2021 5:34:46 PM By: Levan Hurst RN, BSN Signed: 07/05/2021 4:36:32  PM By: Linton Ham MD Previous Signature: 07/04/2021 4:15:21 PM Version By: Linton Ham MD Entered By: Levan Hurst on 07/04/2021 17:09:14

## 2021-07-18 ENCOUNTER — Encounter (HOSPITAL_BASED_OUTPATIENT_CLINIC_OR_DEPARTMENT_OTHER): Payer: Medicare HMO | Admitting: General Surgery

## 2021-07-26 ENCOUNTER — Encounter: Payer: Medicare HMO | Attending: Surgery | Admitting: Skilled Nursing Facility1

## 2021-07-26 ENCOUNTER — Other Ambulatory Visit: Payer: Self-pay

## 2021-07-26 DIAGNOSIS — Z6841 Body Mass Index (BMI) 40.0 and over, adult: Secondary | ICD-10-CM | POA: Diagnosis not present

## 2021-07-26 DIAGNOSIS — Z713 Dietary counseling and surveillance: Secondary | ICD-10-CM | POA: Diagnosis not present

## 2021-07-26 NOTE — Progress Notes (Signed)
Supervised Weight Loss Visit ?Bariatric Nutrition Education ? ?Planned Surgery: sleeve gastrectomy   ? ? ?2 out of 6 SWL Appointments  ? ? ?NUTRITION ASSESSMENT ? ?Anthropometrics  ?Start weight at [facility]: 350.7 lbs (date: 06/14/2021)  ?Weight: 350 pounds ?BMI: 58.33 kg/m2   ?  ?Clinical  ?Medical hx: Graves, cerebellar degeneration, Lupus ?Medications: see list  ?Labs: none in EMR ?Notable signs/symptoms:  ?Any previous deficiencies? Yes: b12 ? ?Lifestyle & Dietary Hx ? ?Pt states she Wakes feeling exhausted and coffee and tea makes it worse because it makes her really nervous. Pt states her muscles get fatigueed and is not stable sometimes: Pts gate is unsteady and bounces off walls to assist in her trajectory seemingly lacking spatial awareness: states she is not interested in trying PT or OT  ? 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   ? ?Typically wakes around 12.  ? ?Pt states she has done PT but feels she did not get anything out of it.  ? ?Pt states she does not like to cook.  ?Pt states she forgets what she is supposed to be doing. ?Pt states she bought yogurt to eat in the morning but she keeps forgetting to eat it.  ? ?Pt asks when will she be getting surgery: Dietitian advised she needed to finish her appts with the dietitian and be cleared by the psychologist and right now due to her lack of progress with lifetsyle changes she would not be an appropriate candidiate and be at increased risk overall after sugery: pt states she just cannot remember when she gets home and states she needs more appts. In between the 30 days ? ? ?Dietitian advised pt to work with a mental health therapist but pt continues to not be open to this.  ? ?Pt states she bought a refurbished PS5 but it did not work so had to return it which she is pretty bummed about. Pt states her New leasing lady came in for an inspection and is worried she is going to try and kick her out: Pt desires  independence and when the land lady suggested a care giver pt did not appreciate that suggestion. Pt states her mom does not like the fact she has any friends. Pt states she cant live with her dad because he would charge her 2000 dollars in rent. Pt states she has had some deaths in her family recently.  ?Pt states she has 5 siblings in [place] and 1 in [place] with not much contact with them.  ?Pt states she tried to start a diet but it has been too hard. Pt states she heard you can drink henesey on the keto so has been drinking without worry. Pt states she does not know how much hensey she drinks pt states she has not passed out lately from drinking trying to reduce how much she drinks.   ?Pt states she will skip meals throughout the day not eating until let in the day.  ?Pt states she wants to increase her water consumption. Pt states she is trying to not do so much door dash.  ?Pt states antonio her baby dadys best friend who she drinks with is trying to stop drinking staing she influences him to keep drinking.   ? ? ? ?Estimated daily fluid intake: unknown oz ?Supplements:  ?Current average weekly physical activity: ADL's ? ?24-Hr Dietary Recall ?First Meal:  ?Snack:  ?Second Meal 12: 8 crab rangoons or asparagus +  cheese ?Snack: fried mazzarella sticks + cheerwine  ?Third Meal 5pm beef stew + rice + corn bread or 5 [employer] burgers and henesey or asparagus and cheese ?Snack 7pm:  ?Beverages: water, cheerwine, henesy, jose cuervo ? ?Estimated Energy Needs ?Calories: 1600 ? ? ?NUTRITION DIAGNOSIS  ?Overweight/obesity ([place]-3.3) related to past poor dietary habits and physical inactivity as evidenced by patient w/ planned sleeve gastrectomy surgery following dietary guidelines for continued weight loss. ? ? ?NUTRITION INTERVENTION  ?Nutrition counseling (C-1) and education (E-2) to facilitate bariatric surgery goals. ? ?Pre-Op Goals Progress & New Goals ?Continue: Think about working with a mental health  professional ?Continue: Aim for a structured day of eating ?continue: aim to eat 1 servings of non starchy vegetables per day with dinner (other than pepper and onion) 7 days a week (trying but it is not going that good stating she was doing a lot of doordash) ?continue: set an alarm to remind you to eat breakfast every day (not going good stating she cannot keep up with it stating when th alarm goes off she turns it off and does not eat) ?continue: try the pre cut cabbage so you do not cut yourself (did look for it but did not want it with other stuff) ?NEW: drink more water  ? ?Handouts Provided Include  ? ? 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 ?RD's Notes for next Visit  ?Assess pts adherence to chosen goals ? ? ?MONITORING & EVALUATION ?Dietary intake, weekly physical activity, body weight, and pre-op goals in 1 month.  ? ?Next Steps  ?Patient is to return to [facility] in 1 month ?

## 2021-08-01 ENCOUNTER — Ambulatory Visit: Payer: Medicare HMO | Admitting: Critical Care Medicine

## 2021-08-14 ENCOUNTER — Ambulatory Visit: Payer: Medicare HMO | Admitting: Critical Care Medicine

## 2021-08-15 ENCOUNTER — Encounter: Payer: Self-pay | Admitting: Critical Care Medicine

## 2021-08-24 ENCOUNTER — Telehealth: Payer: Self-pay

## 2021-08-24 NOTE — Telephone Encounter (Signed)
Message received from Dr Delford Field noting patient is requesting information about programs available for people with disabilities, she is interested in finding a job. ? ?Call placed to patient and provided her with the phone numbers for : ?[employer]  [phone] and Disability [facility] [phone].  ? ?Instructed her to call this CM back if she has questions, needs more information.  ?

## 2021-08-28 ENCOUNTER — Encounter: Payer: Medicare HMO | Attending: Surgery | Admitting: Skilled Nursing Facility1

## 2021-08-28 DIAGNOSIS — Z713 Dietary counseling and surveillance: Secondary | ICD-10-CM | POA: Insufficient documentation

## 2021-08-28 DIAGNOSIS — Z6841 Body Mass Index (BMI) 40.0 and over, adult: Secondary | ICD-10-CM | POA: Insufficient documentation

## 2021-08-28 NOTE — Progress Notes (Signed)
Supervised Weight Loss Visit ?Bariatric Nutrition Education ? ?Planned Surgery: sleeve gastrectomy   ? ? ?3 out of 6 SWL Appointments  ? ? ?NUTRITION ASSESSMENT ? ?Anthropometrics  ?Start weight at [facility]: 350.7 lbs (date: 06/14/2021)  ?Weight: 347.6 pounds ?BMI: 57.88 kg/m2   ?  ?Clinical  ?Medical hx: Graves, cerebellar degeneration, Lupus ?Medications: see list  ?Labs: none in EMR ?Notable signs/symptoms:  ?Any previous deficiencies? Yes: b12 ? ?Lifestyle & Dietary Hx ? ?Pt states she Wakes feeling exhausted and coffee and tea makes it worse because it makes her really nervous. Pt states her muscles get fatigueed and is not stable sometimes: Pts gate is unsteady and bounces off walls to assist in her trajectory seemingly lacking spatial awareness: states she is not interested in trying PT or OT  ? 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   ? ?Typically wakes around 12.  ? ?Pt states she has done PT but feels she did not get anything out of it.  ? ?Pt states she does not like to cook.  ?Pt states she forgets what she is supposed to be doing. ? ?Dietitian advised pt to work with a mental health therapist but pt continues to not be open to this.  ? ? ?Pt states she did not get any sleep last night stating that always happens when she has something to do the next day like a doctors apt.  ?Pt states she has been eating more non starchy vegetables.  ?Pt states she typically eats one meal a day with absolutely no snacks in between. Pt states she is going to make a salad from home maybe.  ?Pt states she is trying to get away from drinking her calories.  ?Pt states so far today she has only drank the fruit punch from [employer] no other drinks including water.  ?Pt states she still drinks alcohol.  drank enough to not remember some events last night.  Pt states she is stopping alcohol cold [place] today, until her birthday.  Dietitian expressed the need to not drink until she passes out,  and need to be more responsible while drinking.  Dietitian educated patient on caloric content of alcohol and leads to weight gain. ?Pt states her and her mom want to get to swimming or something for activity. ?Pt states she used to work out with increased activity but states could never portion control her food to save her life. ?Pt states her food stamps were taken away, stating she only gets $12 a month.  ? ?Estimated daily fluid intake: unknown oz ?Supplements:  ?Current average weekly physical activity: ADL's ? ?24-Hr Dietary Recall: woke up at 9:30 am this morning ?First Meal:  ?Snack:  ?Second Meal 12: fast food: fried chicken and fries and fruit punch ?Snack 3 pm: eaten out ([place] style food) ?Third Meal 5pm beef stew + rice + corn bread or 5 [employer] burgers and henesey or asparagus and cheese ?Snack 7pm:  ?Beverages: water, cheerwine, henesy, jose cuervo, cas migos, sweet tea + splenda  ? ?Estimated Energy Needs ?Calories: 1600 ? ? ?NUTRITION DIAGNOSIS  ?Overweight/obesity ([place]-3.3) related to past poor dietary habits and physical inactivity as evidenced by patient w/ planned sleeve gastrectomy surgery following dietary guidelines for continued weight loss. ? ? ?NUTRITION INTERVENTION  ?Nutrition counseling (C-1) and education (E-2) to facilitate bariatric surgery goals. ? ?Pre-Op Goals Progress & New Goals ?Continue: Think about working with a mental health professional ?Continue: Aim for a structured  day of eating ?continue: aim to eat 1 servings of non starchy vegetables per day with dinner (other than pepper and onion) 7 days a week (trying but it is not going that good stating she was doing a lot of doordash) ?continue: set an alarm to remind you to eat breakfast every day (not going good stating she cannot keep up with it stating when th alarm goes off she turns it off and does not eat) ?continue: try the pre cut cabbage so you do not cut yourself (did look for it but did not want it with other  stuff) ?Continue/NEW: drink more water (3 bottles a day). Use tally/tick marks to track bottle count. Pt states she will try tap water to save money and time. ?New: Eat twice a day ?New: No alcohol; working on remembering and control in the moment ? ?Handouts Provided Include  ? ? ?Learning Style & Readiness for Change ?Teaching method utilized: Visual & Auditory  ?Demonstrated degree of understanding via: Teach Back  ?Readiness Level: pre-contemplative  ?Barriers to learning/adherence to lifestyle change: ETOH/cerebellar degeneration, difficulty with control ? ?RD's Notes for next Visit  ?Assess pts adherence to chosen goals ? ? ?MONITORING & EVALUATION ?Dietary intake, weekly physical activity, body weight, and pre-op goals in 1 month.  ? ?Next Steps  ?Patient is to return to [facility] in 1 month ?

## 2021-08-28 NOTE — Progress Notes (Signed)
? ?GYNECOLOGY OFFICE VISIT NOTE ? ?History:  ? Erika Boyer is a 26 y.o. G2P0110 here today for irregular cycles which is complicated by her other medical history. Reports LMP 08/04/21.  ? ?Past 4 months she will spot for a day and then might bleed for a day or two. She is not on anything for birth control.  ? ?She is a poor historian and tells me her cycles are regular but they are not. In her app, her prior period was 2/18. She may have mid cycle spotting vs she is having BTB and it is not a full period each month as she reports some cycles are heavier and some are light.  ? ?She is not currently sexually active and does not wish to be.  ? ?  ?Past Medical History:  ?Diagnosis Date  ? Anemia   ? Anxiety   ? Arthritis   ? "right knee" (10/11/2016)  ? 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 Cervical high risk HPV (human papillomavirus) test positive 03/20/2017  ? Pap q year  ? Chronic bronchitis ([place])   ? Daily headache   ? Depression   ? Graves disease 2012  ? hx; "was on RX; it disappeared" (10/11/2016)  ? History of blood transfusion   ? "related to the anemia" (10/11/2016)  ? Hypertension   ? Lupus ([place])   ? "not sure what kind" (10/11/2016)  ? Microcytic anemia 10/03/2016  ? Pneumonia 10/03/2016  ? ? ?Past Surgical History:  ?Procedure Laterality Date  ? PERICARDIOCENTESIS N/A 10/04/2016  ? Procedure: Pericardiocentesis;  Surgeon: Adrian Prows, MD;  Location: [facility];  Service: Cardiovascular;  Laterality: N/A;  ? REDUCTION MAMMAPLASTY Bilateral 2012  ? TONSILLECTOMY    ? ? ?The following portions of the patient's history were reviewed and updated as appropriate: allergies, current medications, past family history, past medical history, past social history, past surgical history and problem list.  ? ?Health Maintenance:   ?Normal pap: ?Diagnosis  ?Date Value Ref Range Status  ?08/06/2019   Final  ? - Negative for intraepithelial lesion or malignancy (NILM)  ? ?Review of Systems:  ?Pertinent items noted in HPI  and remainder of comprehensive ROS otherwise negative. ? ?Physical Exam:  ?BP 117/73   Pulse 83   Ht 5\' 5"  (1.651 m)   Wt (!) 342 lb (155.1 kg)   LMP 08/04/2021 (Exact Date)   BMI 56.91 kg/m?  ?CONSTITUTIONAL: Well-developed, well-nourished female in no acute distress.  ?HEENT:  Normocephalic, atraumatic. External right and left ear normal. No scleral icterus.  ?NECK: Normal range of motion, supple, no masses noted on observation ?SKIN: No rash noted. Not diaphoretic. No erythema. No pallor. ?MUSCULOSKELETAL: Normal range of motion. No edema noted. ?NEUROLOGIC: Alert and oriented to person, place, and time. Normal muscle tone coordination. No cranial nerve deficit noted. ?PSYCHIATRIC: Normal mood and affect. Normal behavior. Normal judgment and thought content. ? ?CARDIOVASCULAR: Normal heart rate noted ?RESPIRATORY: Effort and breath sounds normal, no problems with respiration noted ?ABDOMEN: No masses noted. No other overt distention noted.   ? ?PELVIC: Deferred ? ?Labs and Imaging ?No results found for this or any previous visit (from the past 168 hour(s)). ?No results found.  ?Assessment and Plan:  ? 1. Irregular menstrual cycle ?- Discussed differential for oligo ?- Will check routine labs for this - beta HCG, LH/FSH, PRL, E2, free testosterone, TSH, 17OHP ?- Reviewed potential therapy options assuming labs are otherwise normal - if labs are inconclusive or c/w PCOS will  bring her back for follow up in 3 months to review her app of her cycles.  ?- She is hoping to have bariatric surgery soon - she thinks in the next 3-6 months. Discussed importance of not conceiving for the 18-24 months following bariatric.  ?-     17-Hydroxyprogesterone ?-     Estradiol ?-     Follicle stimulating hormone ?-     Luteinizing hormone ?-     Prolactin ?-     Testosterone , Free and Total ?-     TSH ?-     Progesterone ?-     B-HCG Quant ?-     US PELVIC COMPLETE WITH TRANSVAGINAL; Future ? ? ? ?Routine preventative health  maintenance measures emphasized. ?Please refer to After Visit Summary for other counseling recommendations.  ? ?Return in about 3 months (around 11/29/2021). ? ?Radene Gunning, MD, FACOG ?Obstetrician Social research officer, government, Faculty Practice ?[facility] ? ? ? ? ? ?

## 2021-08-30 ENCOUNTER — Ambulatory Visit (INDEPENDENT_AMBULATORY_CARE_PROVIDER_SITE_OTHER): Payer: Medicare HMO | Admitting: Obstetrics and Gynecology

## 2021-08-30 ENCOUNTER — Encounter: Payer: Self-pay | Admitting: Obstetrics and Gynecology

## 2021-08-30 VITALS — BP 117/73 | HR 83 | Ht 65.0 in | Wt 342.0 lb

## 2021-08-30 DIAGNOSIS — N926 Irregular menstruation, unspecified: Secondary | ICD-10-CM

## 2021-08-30 NOTE — Addendum Note (Signed)
Addended by: Milas Hock A on: 08/30/2021 02:09 PM ? ? Modules accepted: Orders ? ?

## 2021-08-31 ENCOUNTER — Other Ambulatory Visit: Payer: Self-pay | Admitting: Physician Assistant

## 2021-08-31 DIAGNOSIS — Z7189 Other specified counseling: Secondary | ICD-10-CM

## 2021-08-31 NOTE — Progress Notes (Signed)
Referral for prebariatric surgery psychiatric evaluation sent. ? ?Roney Jaffe, PA-C ?Physician Assistant ?[facility] ?[email or web address] ? ?

## 2021-09-01 LAB — TSH: TSH: 2 u[IU]/mL (ref 0.450–4.500)

## 2021-09-01 LAB — 17-HYDROXYPROGESTERONE: 17-OH Progesterone LCMS: 197 ng/dL

## 2021-09-01 LAB — FOLLICLE STIMULATING HORMONE: FSH: 2.7 m[IU]/mL

## 2021-09-01 LAB — ESTRADIOL: Estradiol: 160 pg/mL

## 2021-09-01 LAB — PROGESTERONE: Progesterone: 8.5 ng/mL

## 2021-09-01 LAB — LUTEINIZING HORMONE: LH: 6.7 m[IU]/mL

## 2021-09-01 LAB — PROLACTIN: Prolactin: 31.2 ng/mL — ABNORMAL HIGH (ref 4.8–23.3)

## 2021-09-02 ENCOUNTER — Encounter: Payer: Self-pay | Admitting: Obstetrics and Gynecology

## 2021-09-03 ENCOUNTER — Encounter: Payer: Self-pay | Admitting: Obstetrics and Gynecology

## 2021-09-06 ENCOUNTER — Inpatient Hospital Stay: Admission: RE | Admit: 2021-09-06 | Payer: Medicare HMO | Source: Ambulatory Visit

## 2021-09-08 LAB — TESTOSTERONE,FREE AND TOTAL
Testosterone, Free: 0.6 pg/mL (ref 0.0–4.2)
Testosterone: 32 ng/dL (ref 13–71)

## 2021-09-08 LAB — BETA HCG QUANT (REF LAB): hCG Quant: <1 m[IU]/mL

## 2021-09-11 ENCOUNTER — Telehealth: Payer: Self-pay | Admitting: General Practice

## 2021-09-11 NOTE — Telephone Encounter (Signed)
-----   Message from Milas Hock, MD sent at 09/11/2021  7:58 AM EDT ----- ?I would recommend a repeat prolactin as a fasting test - her labs are otherwise normal. They also show she is ovulating, but the prolactin could be influencing some of the regularity. Please let her know. (She could potentially do the lab when she is here on 4/26 but that might be hard to fast until 14:00) ?Thanks, pad ?

## 2021-09-11 NOTE — Telephone Encounter (Signed)
Called patient, no answer- left message to call us back for results. Will send mychart message 

## 2021-09-13 ENCOUNTER — Ambulatory Visit: Payer: Medicare HMO | Attending: Obstetrics and Gynecology

## 2021-09-20 ENCOUNTER — Ambulatory Visit (INDEPENDENT_AMBULATORY_CARE_PROVIDER_SITE_OTHER): Payer: Medicare HMO | Admitting: Cardiology

## 2021-09-20 ENCOUNTER — Encounter: Payer: Self-pay | Admitting: Critical Care Medicine

## 2021-09-20 ENCOUNTER — Encounter (HOSPITAL_BASED_OUTPATIENT_CLINIC_OR_DEPARTMENT_OTHER): Payer: Self-pay | Admitting: Cardiology

## 2021-09-20 VITALS — BP 137/85 | HR 85 | Ht 65.0 in | Wt 341.8 lb

## 2021-09-20 DIAGNOSIS — Z7189 Other specified counseling: Secondary | ICD-10-CM

## 2021-09-20 DIAGNOSIS — Z01810 Encounter for preprocedural cardiovascular examination: Secondary | ICD-10-CM

## 2021-09-20 DIAGNOSIS — M329 Systemic lupus erythematosus, unspecified: Secondary | ICD-10-CM | POA: Diagnosis not present

## 2021-09-20 NOTE — Progress Notes (Signed)
?Cardiology Office Note:   ? ?Date:  09/20/2021  ? ?ID:  Erika Boyer, DOB 10-08-1995, MRN [id number] ? ?PCP:  Elsie Stain, MD  ?Cardiologist:  Buford Dresser, MD PhD ? ?Referring MD: Elsie Stain, MD  ? ?CC: follow up ? ?History of Present Illness:   ? ?Erika Boyer is a 26 y.o. female with a hx of graves disease, lupus, prior pericardial effusion with tamponade requiring pericardiocentesis 2018, postviral cerebellar degeneration and morbid obesity is seen in follow up. She was initially seen 11/26/2017 as a new consult at the request of [name] for evaluation of preoperative cardiac risk (dental surgery). ? ?Today: ?Right knee pain swelling intermittently and chronically since a fall. Planned for bariatric surgery, date TBD. No symptoms. Climbs stairs without limitation.  ? ?BMI currently 56.88. Lupus well controlled.  ? ?Denies chest pain, shortness of breath at rest or with normal exertion. No PND, orthopnea, LE edema or unexpected weight gain. No syncope or palpitations.  ? ? ?Past Medical History:  ?Diagnosis Date  ? Anemia   ? Anxiety   ? Arthritis   ? "right knee" (10/11/2016)  ? 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 Cervical high risk HPV (human papillomavirus) test positive 03/20/2017  ? Pap q year  ? Chronic bronchitis ([place])   ? Daily headache   ? Depression   ? Graves disease 2012  ? hx; "was on RX; it disappeared" (10/11/2016)  ? History of blood transfusion   ? "related to the anemia" (10/11/2016)  ? Hypertension   ? Lupus ([place])   ? "not sure what kind" (10/11/2016)  ? Microcytic anemia 10/03/2016  ? Pneumonia 10/03/2016  ? ? ?Past Surgical History:  ?Procedure Laterality Date  ? PERICARDIOCENTESIS N/A 10/04/2016  ? Procedure: Pericardiocentesis;  Surgeon: Adrian Prows, MD;  Location: [facility];  Service: Cardiovascular;  Laterality: N/A;  ? REDUCTION MAMMAPLASTY Bilateral 2012  ? TONSILLECTOMY    ? ? ?Current Medications: ?Current Meds  ?Medication Sig  ? albuterol (VENTOLIN  HFA) 108 (90 Base) MCG/ACT inhaler Inhale 2 puffs into the lungs every 4 (four) hours as needed for wheezing or shortness of breath.  ? diphenhydramine-acetaminophen (TYLENOL PM EXTRA STRENGTH) 25-500 MG TABS tablet 1 tablet at bedtime as needed  ? Hydroxychloroquine Sulfate 100 MG TABS Take 200 mg by mouth daily.  ? Multiple Vitamin (MULTIVITAMIN) capsule Take 1 capsule by mouth daily.  ?  ? ?Allergies:   Shellfish allergy, Morphine and related, Mushroom extract complex, and Tapazole [methimazole]  ? ?Social History  ? ?Tobacco Use  ? Smoking status: Never  ? Smokeless tobacco: Never  ?Vaping Use  ? Vaping Use: Never used  ?Substance Use Topics  ? Alcohol use: No  ? Drug use: No  ? ? ?Family History: ?The patient's family history includes Arthritis in her mother; Breast cancer in her maternal aunt, maternal grandmother, and mother; Diabetes in her paternal grandfather; High blood pressure in her father; Mental illness in her father; Multiple sclerosis in her cousin; Sickle cell trait in her maternal aunt; Thalassemia in her mother. ?Cousin died at age 68 from heart attacks, had history of sickle cell. Recently had another cousin pass away at a young age (27) from some time of inflammatory disease and obesity. ? ?ROS:   ?Please see the history of present illness. ROS otherwise unremarkable ? ?EKGs/Labs/Other Studies Reviewed:   ? ?The following studies were reviewed today: ?Echo 10-04-16 ?Study Conclusions  ?- Left ventricle: The cavity size is small  and underfilled. ?  Systolic function was normal. The estimated ejection fraction was ?  in the range of 60% to 65%. Wall motion was normal; there were no ?  regional wall motion abnormalities. Left ventricular diastolic ?  function parameters were normal. ?- Aortic valve: Trileaflet; normal thickness leaflets. There was no ?  regurgitation. ?- Right ventricle: The cavity size was normal. Wall thickness was ?  normal. Systolic function was normal. ?- Tricuspid valve:  There was mild regurgitation. ?- Pulmonary arteries: Systolic pressure was mildly increased. PA ?  peak pressure: 33 mm Hg (S). ?- Pericardium, extracardiac: There was a large left pleural ?  effusion. ?  ?Impressions  ?- There is severe circumferential pericardial effusion with up to 3 ?  cm around the lateral LV wall and up to 4 cm around the right ?  atrium. ?  There is right atrial invagination and imcomplete RV filling ?  during diastole. ?  No significant mitral or tricuspid inflow changes with ?  respiration however it might be affected by ventilations. ?  ?  These findings are suspicious for an early tamponade. A ?  pericardiocentensis is recommended. ? ?Followed by pericardiocentesis same day with removal of 863ml serosanguinous fluid. ? ?EKG:  EKG is personally reviewed today.   ?09/20/21: NSR at 85 bpm ? ?Recent Labs: ?10/20/2020: ALT 17; BUN 12; Creatinine, Ser 0.85; Hemoglobin 11.9; Platelets 311; Potassium 4.2; Sodium 140 ?08/30/2021: TSH 2.000  ?Recent Lipid Panel ?   ?Component Value Date/Time  ? CHOL 82 10/04/2016 1430  ? TRIG 139 10/03/2016 1928  ? ? ?Physical Exam:   ? ?VS:  BP 137/85 (BP Location: Left Arm, Patient Position: Sitting, Cuff Size: Normal)   Pulse 85   Ht 5\' 5"  (1.651 m)   Wt (!) 341 lb 12.8 oz (155 kg)   BMI 56.88 kg/m?    ? ?Wt Readings from Last 3 Encounters:  ?09/20/21 (!) 341 lb 12.8 oz (155 kg)  ?08/30/21 (!) 342 lb (155.1 kg)  ?08/28/21 (!) 347 lb 12.8 oz (157.8 kg)  ?  ?GEN: Well nourished, well developed in no acute distress ?HEENT: Normal, moist mucous membranes ?NECK: No JVD ?CARDIAC: regular rhythm, normal S1 and S2, no rubs or gallops. No murmur. ?VASCULAR: Radial and DP pulses 2+ bilaterally. No carotid bruits ?RESPIRATORY:  Clear to auscultation without rales, wheezing or rhonchi  ?ABDOMEN: Soft, non-tender, non-distended ?MUSCULOSKELETAL:  Ambulates independently ?SKIN: Warm and dry, no edema ?NEUROLOGIC:  Alert and oriented x 3. No focal neuro deficits  noted. ?PSYCHIATRIC:  Normal affect   ? ?ASSESSMENT:   ? ?1. Preop cardiovascular exam   ?2. Morbid obesity ([place])   ?3. History of systemic lupus erythematosus (SLE) ([place])   ?4. Cardiac risk counseling   ? ? ?PLAN:   ? ?Preoperative cardiac risk evaluation for potential future bariatric surgery: she does not have a contraindication from a cardiovascular perspective. RCRI=0. She is optimized from a cardiac perspective and would benefit from weight loss. ? ?The patient is not currently having active cardiac symptoms, and they can achieve >4 METs of activity. ? ?According to ACC/AHA Guidelines, no further testing is needed.  Proceed with surgery at acceptable risk.  Our service is available as needed in the peri-operative period.    ? ?Prior pericardial effusion with tamponade s/p pericardiocentesis: has remained stable without recurrent symptoms ?-PRN echo if lupus has a severe flare and she becomes short of breath/chest pain, given prior history. ? ?Morbid obesity: pending bariatric surgery ? ?  Primary prevention: discussed the risks of inflammatory diseases such as lupus in the long term. Framed in the background of lifestyle changes.  ? ?Follow up: 2 years or sooner PRN ? ?Medication Adjustments/Labs and Tests Ordered: ?Current medicines are reviewed at length with the patient today.  Concerns regarding medicines are outlined above.  ?Orders Placed This Encounter  ?Procedures  ? EKG 12-Lead  ? ?No orders of the defined types were placed in this encounter. ? ? ?Patient Instructions  ?Medication Instructions:  ?Your physician recommends that you continue on your current medications as directed. Please refer to the Current Medication list given to you today.  ? ?*If you need a refill on your cardiac medications before your next appointment, please call your pharmacy* ? ?Lab Work: ?NONE ? ?Testing/Procedures: ?NONE ? ?Follow-Up: ?At [facility], you and your health needs are our priority.  As part of our continuing  mission to provide you with exceptional heart care, we have created designated Provider Care Teams.  These Care Teams include your primary Cardiologist (physician) and Advanced Practice Providers (APPs -  Physician Assistan

## 2021-09-20 NOTE — Patient Instructions (Signed)
Medication Instructions:  Your physician recommends that you continue on your current medications as directed. Please refer to the Current Medication list given to you today.   *If you need a refill on your cardiac medications before your next appointment, please call your pharmacy*  Lab Work: NONE  Testing/Procedures: NONE  Follow-Up: At [facility], you and your health needs are our priority.  As part of our continuing mission to provide you with exceptional heart care, we have created designated Provider Care Teams.  These Care Teams include your primary Cardiologist (physician) and Advanced Practice Providers (APPs -  Physician Assistants and Nurse Practitioners) who all work together to provide you with the care you need, when you need it.  We recommend signing up for the patient portal called "MyChart".  Sign up information is provided on this After Visit Summary.  MyChart is used to connect with patients for Virtual Visits (Telemedicine).  Patients are able to view lab/test results, encounter notes, upcoming appointments, etc.  Non-urgent messages can be sent to your provider as well.   To learn more about what you can do with MyChart, go to [email or web address].    Your next appointment:   2 year(s)  The format for your next appointment:   In Person  Provider:   Bridgette Christopher, MD         

## 2021-09-28 ENCOUNTER — Encounter: Payer: Self-pay | Admitting: Skilled Nursing Facility1

## 2021-09-28 ENCOUNTER — Encounter: Payer: Medicare HMO | Attending: Surgery | Admitting: Skilled Nursing Facility1

## 2021-09-28 DIAGNOSIS — Z713 Dietary counseling and surveillance: Secondary | ICD-10-CM | POA: Insufficient documentation

## 2021-09-28 DIAGNOSIS — Z6841 Body Mass Index (BMI) 40.0 and over, adult: Secondary | ICD-10-CM | POA: Diagnosis not present

## 2021-09-28 NOTE — Progress Notes (Signed)
Supervised Weight Loss Visit ?Bariatric Nutrition Education ? ?Planned Surgery: sleeve gastrectomy   ? ? ?4 out of 6 [facility] Appointments  ? ? ?NUTRITION ASSESSMENT ? ?Anthropometrics  ?Start weight at [facility]: 350.7 lbs (date: 06/14/2021)  ?Weight: 342.6 pounds ?BMI: 57.01 kg/m2   ?  ?Clinical  ?Medical hx: Graves, cerebellar degeneration, Lupus ?Medications: see list  ?Labs: none in EMR ?Notable signs/symptoms:  ?Any previous deficiencies? Yes: b12 ? ?Lifestyle & Dietary Hx ? ?Pt states she Wakes feeling exhausted and coffee and tea makes it worse because it makes her really nervous. Pt states her muscles get fatigueed and is not stable sometimes: Pts gate is unsteady and bounces off walls to assist in her trajectory seemingly lacking spatial awareness: states she is not interested in trying PT or OT  ? 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   ? ?Typically wakes around 12.  ? ?Pt states she does not like to cook.  ?Pt states she forgets what she is supposed to be doing (said in previous apt) at today's appt (09/28/2021) pt states she does remember the goals she is supposed to remember and does remember she wants bariatric surgery when she is at home day to day and pt states she thinks hard about bariatric surgery every day.  ? ?Dietitian advised pt to work with a mental health therapist but pt continues to not be open to this. Dietitian advised pt to work with a PT but pt continues to adamantly decline.  ? ? ?Pt states the cravings for [employer] was too great so she gave in.  ?Pt states people are stupid (referring to her guy friends) so she has been staying by herself lately.  ?Pt states she is doing a dating app. Pt states she gets calls from men from the app stating she is not stupid she does not give out her address.  ?Pt states she struggles to save up stating she is working on her door dash issue but then realizes shes got more money for wigs so then cannot save her money. 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  ? ?Pt does struggle with impulse control.  ? ? ?Pt believes her excessive portion sizes to be due to bordeum and when asked what kind of hobbies does she think she could get into pt replied "My diability does not allow me to do a lot of things"  ? ?Dietitian asked doe she thinks she would like video games pt replied "Im Scared I will go crazy and get addicted to it"  ? ? ?Pt states she is not going to drink alcohol for 3 months and then get Henesy for her Rudene Anda, when asked how much will she drink she replied holding up her hands showing most of the bottle.  ? ?Estimated daily fluid intake: unknown oz ?Supplements:  ?Current average weekly physical activity: ADL's ? ?24-Hr Dietary Recall: woke up at 9:30 am this morning ?First Meal:  ?Snack:  ?Second Meal 12: fast food: fried chicken and fries and fruit punch ?Snack 3 pm: eaten out ([place] style food) ?Third Meal 4 pm ground [place] tacos  ?Snack 11-12:30pm: 2 cheesenurger meal with fanta orange soda and 10 pizza rolls ?Beverages: water, cheerwine, henesy, jose cuervo, cas migos, sweet tea + splenda, ornag ? ?Estimated Energy Needs ?Calories: 1600 ? ? ?  NUTRITION DIAGNOSIS  ?Overweight/obesity ([place]-3.3) related to past poor dietary habits and physical inactivity as evidenced by patient w/ planned sleeve gastrectomy surgery following dietary guidelines for continued weight loss. ? ? ?NUTRITION INTERVENTION  ?Nutrition counseling (C-1) and education (E-2) to facilitate bariatric surgery goals. ? ?Pre-Op Goals Progress & New Goals ?Continue: Think about working with a mental health professional ?Continue: Aim for a structured day of eating ?continue: aim to eat 1 servings of non starchy vegetables per day with dinner (other than pepper and onion) 7 days a week (trying but it is not going that good stating she was  doing a lot of [employer]) ?continue: set an alarm to remind you to eat breakfast every day (not going good stating she cannot keep up with it stating when th alarm goes off she turns it off and does not eat) ?continue: try the pre cut cabbage so you do not cut yourself (did look for it but did not want it with other stuff) ?Continue/NEW: drink more water (3 bottles a day). Use tally/tick marks to track bottle count. Pt states she will try tap water to save money and time. ?Continue: Eat twice a day ?continue: No alcohol; working on remembering and control in the moment ?NEW: go to 5 below or [employer] tree to check out different hobby ideas ? ?Handouts Provided Include  ? ? ?Learning Style & Readiness for Change ?Teaching method utilized: Visual & Auditory  ?Demonstrated degree of understanding via: Teach Back  ?Readiness Level: pre-contemplative  ?Barriers to learning/adherence to lifestyle change: ETOH/cerebellar degeneration, difficulty with control ? ?RD's Notes for next Visit  ?Assess pts adherence to chosen goals ? ? ?MONITORING & EVALUATION ?Dietary intake, weekly physical activity, body weight, and pre-op goals in 1 month.  ? ?Next Steps  ?Patient is to return to [facility] in 1 month ?

## 2021-10-03 ENCOUNTER — Encounter (HOSPITAL_BASED_OUTPATIENT_CLINIC_OR_DEPARTMENT_OTHER): Payer: Self-pay

## 2021-10-03 NOTE — Telephone Encounter (Signed)
Please advise 

## 2021-10-05 ENCOUNTER — Ambulatory Visit: Payer: Medicare HMO | Admitting: Critical Care Medicine

## 2021-10-09 ENCOUNTER — Telehealth: Payer: Self-pay | Admitting: Critical Care Medicine

## 2021-10-09 ENCOUNTER — Encounter: Payer: Self-pay | Admitting: Family Medicine

## 2021-10-09 NOTE — Telephone Encounter (Signed)
Pt is calling if Dr. Delford Field can write a letter to state that the pt is disabled and what her disabilities include.  Pt is trying to get a part time job. Please advise CB- [phone]

## 2021-10-09 NOTE — Telephone Encounter (Signed)
I had received this request previously and my response was the need to see her in the office and I see she has an appointment with me May 30 please keep that appointment I have to have a face-to-face exam in the office before I can produce any letters

## 2021-10-09 NOTE — Telephone Encounter (Signed)
Please advise 

## 2021-10-10 NOTE — Telephone Encounter (Signed)
Called pt and she stated that she didn't need it anymore,  I did encourage her to keep upcoming appt.

## 2021-10-12 ENCOUNTER — Ambulatory Visit: Payer: Medicaid Other | Admitting: Family Medicine
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
## 2021-10-17 ENCOUNTER — Ambulatory Visit: Payer: Medicare HMO | Admitting: Critical Care Medicine

## 2021-10-26 ENCOUNTER — Ambulatory Visit: Payer: Medicare HMO | Admitting: Skilled Nursing Facility1

## 2021-11-13 DIAGNOSIS — M329 Systemic lupus erythematosus, unspecified: Secondary | ICD-10-CM | POA: Diagnosis not present

## 2021-11-13 DIAGNOSIS — Z6841 Body Mass Index (BMI) 40.0 and over, adult: Secondary | ICD-10-CM | POA: Diagnosis not present

## 2021-11-13 DIAGNOSIS — J90 Pleural effusion, not elsewhere classified: Secondary | ICD-10-CM | POA: Diagnosis not present

## 2021-11-13 DIAGNOSIS — Z79899 Other long term (current) drug therapy: Secondary | ICD-10-CM | POA: Diagnosis not present

## 2021-11-13 DIAGNOSIS — I3139 Other pericardial effusion (noninflammatory): Secondary | ICD-10-CM | POA: Diagnosis not present

## 2021-11-27 ENCOUNTER — Ambulatory Visit: Payer: Medicare HMO | Admitting: Skilled Nursing Facility1

## 2021-12-01 ENCOUNTER — Encounter (HOSPITAL_COMMUNITY): Payer: Self-pay

## 2021-12-01 ENCOUNTER — Ambulatory Visit (HOSPITAL_COMMUNITY)
Admission: EM | Admit: 2021-12-01 | Discharge: 2021-12-01 | Disposition: A | Discharge status: Home / Self Care | Payer: Medicare HMO | Attending: Family Medicine | Admitting: Family Medicine

## 2021-12-01 DIAGNOSIS — Z113 Encounter for screening for infections with a predominantly sexual mode of transmission: Secondary | ICD-10-CM | POA: Insufficient documentation

## 2021-12-01 DIAGNOSIS — L03012 Cellulitis of left finger: Secondary | ICD-10-CM | POA: Insufficient documentation

## 2021-12-01 DIAGNOSIS — W57XXXA Bitten or stung by nonvenomous insect and other nonvenomous arthropods, initial encounter: Secondary | ICD-10-CM | POA: Diagnosis not present

## 2021-12-01 MED ORDER — DOXYCYCLINE HYCLATE 100 MG PO CAPS: 100.0000 mg | ORAL_CAPSULE | Freq: Two times a day (BID) | ORAL | 0 refills | Status: DC

## 2021-12-01 NOTE — Discharge Instructions (Addendum)
Your lab results will be available within 1-2 days directly to your MyChart.

## 2021-12-01 NOTE — ED Provider Notes (Signed)
[facility]    CSN: [id number] Arrival date & time: 12/01/21  0810      History   Chief Complaint Chief Complaint  Patient presents with   Insect Bite   SEXUALLY TRANSMITTED DISEASE    HPI Erika Boyer is a 26 y.o. female.   HPI Patient presents today with left thumb pain.  She reports being bitten by some type of an insect approximately 2 to 3 days ago.  Initially it was painful and swollen.  The tissue swelling on the lateral aspect of her thumb and now she has swelling of the distal portion of her nailbed.  She denies any fever.  She is also requesting STD testing.  She is asymptomatic.  Declines blood work. 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) test positive 03/20/2017   Pap q year   Chronic bronchitis ([facility])    Daily headache    Depression    Graves disease 2012   hx; "was on RX; it disappeared" (10/11/2016)   History of blood transfusion    "related to the anemia" (10/11/2016)   Hypertension    Lupus ([facility])    "not sure what kind" (10/11/2016)   Microcytic anemia 10/03/2016   Pneumonia 10/03/2016    Patient Active Problem List   Diagnosis Date Noted   Adrenal insufficiency (HCC) 03/07/2021   Joint pain 03/07/2021   Morbid obesity ([facility]CC) 03/07/2021   Other long term (current) drug therapy 03/07/2021   Other specified abnormal immunological findings in serum 03/07/2021   Sleep difficulties 10/20/2020   Morbid obesity with BMI of 50.0-59.9, adult (HCC) 03/08/2020   Pericardial effusion 01/21/2020   Nutritional counseling 01/21/2020   S/P pericardiocentesis 07/29/2019   Anxiety 06/18/2017   Gait disorder 02/04/2017   SLE (systemic lupus erythematosus) (HCC) 10/30/2016   Cognitive disorder    Cerebral atrophy (HCC)    Intellectual disability    Drug-induced lupus erythematosus    Hyperthyroidism 10/03/2016    Cerebellar degeneration (HCC) 10/03/2016   Cardiomegaly 10/03/2016   Graves disease 08/23/2016   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   Low vitamin D level 08/23/2015   History of chlamydia 02/25/2012   Unspecified mood (affective) disorder (HCC) 07/09/2011   Neuropathy 04/10/2011    Past Surgical History:  Procedure Laterality Date   PERICARDIOCENTESIS N/A 10/04/2016   Procedure: Pericardiocentesis;  Surgeon: Yates Decamp, MD;  Location: [facility];  Service: Cardiovascular;  Laterality: N/A;   REDUCTION MAMMAPLASTY Bilateral 2012   TONSILLECTOMY      OB History     Gravida  2   Para  1   Term  0   Preterm  1   AB  1   Living  0      SAB  0   IAB  1   Ectopic  0   Multiple  0   Live Births  0            Home Medications    Prior to Admission medications   Medication Sig Start Date End Date Taking? Authorizing Provider  albuterol (VENTOLIN HFA) 108 (90 Base) MCG/ACT inhaler Inhale 2 puffs into the lungs every 4 (four) hours as needed for wheezing or shortness of breath. 10/20/20   Storm Frisk, MD  diphenhydramine-acetaminophen (TYLENOL PM EXTRA STRENGTH) 25-500 MG TABS  tablet 1 tablet at bedtime as needed    [provider]  Hydroxychloroquine Sulfate 100 MG TABS Take 200 mg by mouth daily.    [provider]  Multiple Vitamin (MULTIVITAMIN) capsule Take 1 capsule by mouth daily.    [provider]    Family History Family History  Problem Relation Age of Onset   Thalassemia Mother    Arthritis Mother    Breast cancer Mother        Mastectomy in early 28's.     High blood pressure Father    Mental illness Father        question bipolar disorder per wife   Sickle cell trait Maternal Aunt    Breast cancer Maternal Aunt        4 maternal aunts w/breast cancer   Breast cancer Maternal Grandmother        Bone   Multiple sclerosis Cousin    Diabetes Paternal Grandfather     Social  History Social History   Tobacco Use   Smoking status: Never   Smokeless tobacco: Never  Vaping Use   Vaping Use: Never used  Substance Use Topics   Alcohol use: No   Drug use: No     Allergies   Shellfish allergy, Morphine and related, Mushroom extract complex, and Tapazole [methimazole]   Review of Systems Review of Systems Pertinent negatives listed in HPI   Physical Exam Triage Vital Signs ED Triage Vitals [12/01/21 0840]  Enc Vitals Group     BP (!) 149/86     Pulse Rate 89     Resp 18     Temp 98.1 F (36.7 C)     Temp src      SpO2 100 %     Weight      Height      Head Circumference      Peak Flow      Pain Score 0     Pain Loc      Pain Edu?      Excl. in GC?    No data found.  Updated Vital Signs BP (!) 149/86 (BP Location: Left Arm)   Pulse 89   Temp 98.1 F (36.7 C)   Resp 18   LMP 11/27/2021   SpO2 100%   Visual Acuity Right Eye Distance:   Left Eye Distance:   Bilateral Distance:    Right Eye Near:   Left Eye Near:    Bilateral Near:     Physical Exam HENT:     Head: Normocephalic.  Cardiovascular:     Rate and Rhythm: Normal rate and regular rhythm.  Pulmonary:     Effort: Pulmonary effort is normal.     Breath sounds: Normal breath sounds and air entry.  Musculoskeletal:     Cervical back: Full passive range of motion without pain and normal range of motion.     Comments: Paronychia left lateral aspect of the left thumb, with erythema and tenderness to palpation surrounding the tissue below the nailbed  Neurological:     Mental Status: She is alert.  Psychiatric:        Behavior: Behavior is cooperative.      UC Treatments / Results  Labs (all labs ordered are listed, but only abnormal results are displayed) Labs Reviewed - No data to display  EKG   Radiology No results found.  Procedures Procedures (including critical care time)  Medications Ordered in UC Medications - No data to display  Initial  Impression / Assessment and Plan / UC Course  I have reviewed the triage vital signs and the nursing notes.  Pertinent labs & imaging results that were available during my care of the patient were reviewed by me and considered in my medical decision making (see chart for details).    Paronychia of the left thumb start doxycycline twice daily for 10 days.  Encourage salt water soaks to help with swelling and tenderness.  I&D not indicated today . Routine STD testing, cytology pending.  Patient advised that results will update to MyChart however if any treatment is warranted we will contact her by phone Final Clinical Impressions(s) / UC Diagnoses   Final diagnoses:  None   Discharge Instructions   None    ED Prescriptions   None    PDMP not reviewed this encounter.   Scot Jun, [place] 12/01/21 [phone]

## 2021-12-01 NOTE — ED Triage Notes (Signed)
Pt c/o bug bite to lt thumb x2 days with swelling and redness to nail area.  Pt requesting STD testing, denies sx's.

## 2021-12-04 LAB — CERVICOVAGINAL ANCILLARY ONLY
Bacterial Vaginitis (gardnerella): NEGATIVE
Candida Glabrata: NEGATIVE
Candida Vaginitis: NEGATIVE
Chlamydia: NEGATIVE
Comment: NEGATIVE
Comment: NEGATIVE
Comment: NEGATIVE
Comment: NEGATIVE
Comment: NEGATIVE
Comment: NORMAL
Neisseria Gonorrhea: NEGATIVE
Trichomonas: POSITIVE — AB

## 2021-12-05 ENCOUNTER — Telehealth (HOSPITAL_COMMUNITY): Payer: Self-pay | Admitting: Emergency Medicine

## 2021-12-05 MED ORDER — METRONIDAZOLE 500 MG PO TABS: 500.0000 mg | ORAL_TABLET | Freq: Two times a day (BID) | ORAL | 0 refills | Status: DC

## 2021-12-27 ENCOUNTER — Encounter (INDEPENDENT_AMBULATORY_CARE_PROVIDER_SITE_OTHER): Payer: Self-pay

## 2021-12-30 NOTE — Progress Notes (Deleted)
GYNECOLOGY OFFICE VISIT NOTE  History:   Erika Boyer is a 26 y.o. G2P0110 here today for follow up for AUB. She was supposed to have an [place] in the interim but it was cancelled. I would still recommend she have the [place]. Since I last saw her, her bleeding has been ***.       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) test positive 03/20/2017   Pap q year   Chronic bronchitis (HCC)    Daily headache    Depression    Graves disease 2012   hx; "was on RX; it disappeared" (10/11/2016)   History of blood transfusion    "related to the anemia" (10/11/2016)   Hypertension    Lupus (HCC)    "not sure what kind" (10/11/2016)   Microcytic anemia 10/03/2016   Pneumonia 10/03/2016    Past Surgical History:  Procedure Laterality Date   PERICARDIOCENTESIS N/A 10/04/2016   Procedure: Pericardiocentesis;  Surgeon: Yates Decamp, MD;  Location: [facility]AS[facility];  Service: Cardiovascular;  Laterality: N/A;   REDUCTION MAMMAPLASTY Bilateral 2012   TONSILLECTOMY      The following portions of the patient's history were reviewed and updated as appropriate: allergies, current medications, past family history, past medical history, past social history, past surgical history and problem list.   Health Maintenance:   Diagnosis  Date Value Ref Range Status  08/06/2019   Final   - Negative for intraepithelial lesion or malignancy (NILM)    Review of Systems:  Pertinent items noted in HPI and remainder of comprehensive ROS otherwise negative.  Physical Exam:  There were no vitals taken for this visit. CONSTITUTIONAL: Well-developed, well-nourished female in no acute distress.  HEENT:  Normocephalic, atraumatic. External right and left ear normal. No scleral icterus.  NECK: Normal range of motion, supple, no masses noted on observation SKIN: No rash noted.  Not diaphoretic. No erythema. No pallor. MUSCULOSKELETAL: Normal range of motion. No edema noted. NEUROLOGIC: Alert and oriented to person, place, and time. Normal muscle tone coordination. No cranial nerve deficit noted. PSYCHIATRIC: Normal mood and affect. Normal behavior. Normal judgment and thought content.  CARDIOVASCULAR: Normal heart rate noted RESPIRATORY: Effort and breath sounds normal, no problems with respiration noted ABDOMEN: No masses noted. No other overt distention noted.    PELVIC: {Blank single:19197::"Deferred","Normal appearing external genitalia; normal urethral meatus; normal appearing vaginal mucosa and cervix.  No abnormal discharge noted.  Normal uterine size, no other palpable masses, no uterine or adnexal tenderness. Performed in the presence of a chaperone"}   GYNECOLOGY OFFICE PROCEDURE NOTE   Erika Boyer is a 26 y.o. G2P0110 here for endometrial biopsy for AUB.  Of note, pap on 07/2019 was normal.   ENDOMETRIAL BIOPSY     The indications for endometrial biopsy were reviewed.   Risks of the biopsy including cramping, bleeding, infection, uterine perforation, inadequate specimen and need for additional procedures were discussed. Offered alternative of hysteroscopy, dilation and curettage in OR. The patient states she understands the R/B/I/A and agrees to undergo procedure today. Urine pregnancy test was {Blank single:19197::"Negative","Not indicated"}. Consent was signed. Time out was performed.    Patient was positioned in dorsal lithotomy position. A vaginal speculum was placed.  The cervix was visualized and was prepped with Betadine.  A single-toothed tenaculum was placed on the  anterior lip of the cervix to stabilize it. The 3 mm pipelle was easily introduced into the endometrial cavity without difficulty to a depth of *** cm, and a {Blank single:19197::"Scant","Moderate"} amount of tissue was obtained after two passes and sent to pathology. The instruments  were removed from the patient's vagina. Minimal bleeding from the cervix was noted. The patient tolerated the procedure well.   Assessment and Plan:   1. Irregular menstrual cycle - Workup for hormonal GYN causes was negative. It certainly can be related to her obesity or other medical co-morbidities.  - EMB done today *** given risk factors for hyperplasia.  - Recommended again the [place] ***    Diagnoses and all orders for this visit:  Irregular menstrual cycle    Routine preventative health maintenance measures emphasized. Please refer to After Visit Summary for other counseling recommendations.   No follow-ups on file.  Milas Hock, MD, FACOG Obstetrician & Gynecologist, [facility] for [facility], [facility]

## 2022-01-01 ENCOUNTER — Ambulatory Visit: Payer: Medicare HMO | Admitting: Obstetrics and Gynecology

## 2022-01-01 DIAGNOSIS — N926 Irregular menstruation, unspecified: Secondary | ICD-10-CM

## 2022-01-03 ENCOUNTER — Ambulatory Visit: Payer: Medicare HMO | Admitting: Physician Assistant

## 2022-03-19 ENCOUNTER — Telehealth: Payer: Self-pay | Admitting: Emergency Medicine

## 2022-03-19 NOTE — Telephone Encounter (Signed)
Copied from [place] [id number]. Topic: General - Other >> Mar 19, 2022  3:00 PM Ja-Kwan M wrote: Reason for CRM: Pt requests a copy of the paperwork for housing accommodations for carpeting. Cb# [phone]

## 2022-03-23 NOTE — Telephone Encounter (Signed)
Called patient and she is aware that a appt is needed, due to no availability until next month  I will send information about mobile clinic through [place]. Pt verbalized understanding.

## 2022-04-02 ENCOUNTER — Ambulatory Visit: Payer: Medicare HMO | Admitting: Physician Assistant

## 2022-04-02 ENCOUNTER — Encounter: Payer: Self-pay | Admitting: Physician Assistant

## 2022-04-02 VITALS — BP 128/81 | HR 76 | Ht 65.0 in | Wt 338.0 lb

## 2022-04-02 DIAGNOSIS — Z6841 Body Mass Index (BMI) 40.0 and over, adult: Secondary | ICD-10-CM | POA: Diagnosis not present

## 2022-04-02 DIAGNOSIS — E6609 Other obesity due to excess calories: Secondary | ICD-10-CM | POA: Diagnosis not present

## 2022-04-02 DIAGNOSIS — G119 Hereditary ataxia, unspecified: Secondary | ICD-10-CM

## 2022-04-02 DIAGNOSIS — F79 Unspecified intellectual disabilities: Secondary | ICD-10-CM | POA: Diagnosis not present

## 2022-04-02 DIAGNOSIS — G319 Degenerative disease of nervous system, unspecified: Secondary | ICD-10-CM | POA: Diagnosis not present
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
## 2022-04-02 NOTE — Patient Instructions (Signed)
Please let us know if there is anything else we can do for you  Roney Jaffe, PA-C Physician Assistant [facility] [email or web address]  It is important to avoid accidents which may result in broken bones.  Here are a few ideas on how to make your home safer so you will be less likely to trip or fall.  Use nonskid mats or non slip strips in your shower or tub, on your bathroom floor and around sinks.  If you know that you have spilled water, wipe it up! In the bathroom, it is important to have properly installed grab bars on the walls or on the edge of the tub.  Towel racks are NOT strong enough for you to hold onto or to pull on for support. Stairs and hallways should have enough light.  Add lamps or night lights if you need ore light. It is good to have handrails on both sides of the stairs if possible.  Always fix broken handrails right away. It is important to see the edges of steps.  Paint the edges of outdoor steps white so you can see them better.  Put colored tape on the edge of inside steps. Throw-rugs are dangerous because they can slide.  Removing the rugs is the best idea, but if they must stay, add adhesive carpet tape to prevent slipping. Do not keep things on stairs or in the halls.  Remove small furniture that blocks the halls as it may cause you to trip.  Keep telephone and electrical cords out of the way where you walk. Always were sturdy, rubber-soled shoes for good support.  Never wear just socks, especially on the stairs.  Socks may cause you to slip or fall.  Do not wear full-length housecoats as you can easily trip on the bottom.  Place the things you use the most on the shelves that are the easiest to reach.  If you use a stepstool, make sure it is in good condition.  If you feel unsteady, DO NOT climb, ask for help. If a health professional advises you to use a cane or walker, do not be ashamed.  These items can keep  you from falling and breaking your bones.

## 2022-05-10 ENCOUNTER — Ambulatory Visit: Payer: Self-pay

## 2022-05-10 NOTE — Telephone Encounter (Signed)
Spoke with patient. S/S this  present.  Informed patient that there are no available appointment. Patient confirmed that she is headed to Urgent care

## 2022-05-10 NOTE — Telephone Encounter (Signed)
  Chief Complaint: ear drainage  Symptoms: R ear drainage and pain 4/10 Frequency: 3 days  Pertinent Negatives: NA Disposition: [] ED /[x] Urgent Care (no appt availability in office) / [] Appointment(In office/virtual)/ []  [place] [facility]/ [] Home Care/ [] Refused Recommended Disposition /[] [place] [facility]/ []  Follow-up with PCP Additional Notes: tried to schedule for UC appt but pt refused, wanted appt on 05/18/22 but only appt was 0830 and pt can't come that early. Pt states she will go to UC at later time.   Reason for Disposition  [1] Clear drainage (not from a head injury) AND [2] persists > 24 hours  Answer Assessment - Initial Assessment Questions 1. LOCATION: "Which ear is involved?"      R ear  2. COLOR: "What is the color of the discharge?"      Clear  3. CONSISTENCY: "How runny is the discharge? Could it be water?"      Not water but not oil  4. ONSET: "When did you first notice the discharge?"     3 days  5. PAIN: "Is there any earache?" "How bad is it?"  (Scale 1-10; or mild, moderate, severe) 7. OTHER SYMPTOMS: "Do you have any other symptoms?" (e.g., headache, fever, dizziness, vomiting, runny nose)     no  Protocols used: Ear - Discharge-A-AH

## 2022-05-22 DIAGNOSIS — M329 Systemic lupus erythematosus, unspecified: Secondary | ICD-10-CM | POA: Diagnosis not present

## 2022-05-22 DIAGNOSIS — Z79899 Other long term (current) drug therapy: Secondary | ICD-10-CM | POA: Diagnosis not present

## 2022-05-22 DIAGNOSIS — Z6841 Body Mass Index (BMI) 40.0 and over, adult: Secondary | ICD-10-CM | POA: Diagnosis not present

## 2022-05-30 ENCOUNTER — Ambulatory Visit: Payer: Medicare HMO

## 2022-05-30 ENCOUNTER — Telehealth: Payer: Self-pay

## 2022-05-30 NOTE — Telephone Encounter (Signed)
This nurse called patient for AWV. Stated that she could not do it at this time. Told her we will call to reschedule for another time.

## 2022-06-11 ENCOUNTER — Telehealth: Payer: Self-pay | Admitting: Critical Care Medicine

## 2022-06-11 DIAGNOSIS — G119 Hereditary ataxia, unspecified: Secondary | ICD-10-CM

## 2022-06-11 DIAGNOSIS — G319 Degenerative disease of nervous system, unspecified: Secondary | ICD-10-CM

## 2022-06-11 DIAGNOSIS — R269 Unspecified abnormalities of gait and mobility: Secondary | ICD-10-CM

## 2022-06-11 NOTE — Telephone Encounter (Signed)
Patient states that she is needing a new walker, the one that she currently has is not working.  Please advise.

## 2022-06-11 NOTE — Telephone Encounter (Addendum)
Call patient to verity with DME company she prefers . [place] left.

## 2022-06-11 NOTE — Addendum Note (Signed)
Addended by: Elsie Stain on: 06/11/2022 03:37 PM   Modules accepted: Orders

## 2022-06-11 NOTE — Telephone Encounter (Signed)
Walker Rx sent

## 2022-06-12 ENCOUNTER — Telehealth: Payer: Self-pay | Admitting: Critical Care Medicine

## 2022-06-12 NOTE — Telephone Encounter (Signed)
Order confirmation  for walker received for [employer].

## 2022-06-12 NOTE — Telephone Encounter (Signed)
Copied from [place] [id number]. Topic: General - Other >> Jun 12, 2022 12:06 PM Ja-Kwan M wrote: Reason for CRM: Pt stated she needs to know which location the Rx was sent to so they can find out if it will be delivered or if it has to be picked up. Pt requests that Roselyn Reef return her call at [phone].

## 2022-06-12 NOTE — Telephone Encounter (Addendum)
Spoke with patient. Informed patient that order had been obtain for a walker. Patient is requesting bariatric walker.  Order obtained for bariatric  walker.  Order faxed to [facility] per patient  request.

## 2022-06-12 NOTE — Telephone Encounter (Signed)
noted 

## 2022-06-14 NOTE — Telephone Encounter (Signed)
Called patient and she is aware that the script has been sent to [facility]

## 2022-06-27 ENCOUNTER — Encounter: Payer: Self-pay | Admitting: Critical Care Medicine

## 2022-06-27 ENCOUNTER — Other Ambulatory Visit (HOSPITAL_COMMUNITY)
Admission: RE | Admit: 2022-06-27 | Discharge: 2022-06-27 | Disposition: A | Discharge status: Home / Self Care | Payer: Medicare HMO | Source: Ambulatory Visit | Attending: Critical Care Medicine | Admitting: Critical Care Medicine

## 2022-06-27 ENCOUNTER — Ambulatory Visit: Payer: Medicare HMO | Admitting: Critical Care Medicine

## 2022-06-27 ENCOUNTER — Ambulatory Visit: Payer: Medicare HMO | Attending: Critical Care Medicine | Admitting: Critical Care Medicine

## 2022-06-27 VITALS — BP 109/72 | HR 82 | Temp 98.1°F | Ht 65.0 in | Wt 333.0 lb

## 2022-06-27 DIAGNOSIS — G119 Hereditary ataxia, unspecified: Secondary | ICD-10-CM

## 2022-06-27 DIAGNOSIS — L932 Other local lupus erythematosus: Secondary | ICD-10-CM | POA: Diagnosis not present

## 2022-06-27 DIAGNOSIS — G319 Degenerative disease of nervous system, unspecified: Secondary | ICD-10-CM | POA: Diagnosis not present

## 2022-06-27 DIAGNOSIS — Z23 Encounter for immunization: Secondary | ICD-10-CM | POA: Diagnosis not present

## 2022-06-27 DIAGNOSIS — Z6841 Body Mass Index (BMI) 40.0 and over, adult: Secondary | ICD-10-CM

## 2022-06-27 DIAGNOSIS — Z113 Encounter for screening for infections with a predominantly sexual mode of transmission: Secondary | ICD-10-CM | POA: Diagnosis not present

## 2022-06-27 DIAGNOSIS — T50905A Adverse effect of unspecified drugs, medicaments and biological substances, initial encounter: Secondary | ICD-10-CM

## 2022-06-27 MED ORDER — ALBUTEROL SULFATE HFA 108 (90 BASE) MCG/ACT IN AERS: 2.0000 | INHALATION_SPRAY | RESPIRATORY_TRACT | 0 refills | Status: AC | PRN

## 2022-06-27 NOTE — Assessment & Plan Note (Signed)
Monitor for now

## 2022-06-27 NOTE — Assessment & Plan Note (Signed)
Request STI testing

## 2022-06-27 NOTE — Patient Instructions (Signed)
Flu vaccine and your last HPV vaccine were given today  Sexual transmitted disease labs will be obtained along with cervical vaginal swab  You are due a Pap smear in March you can achieve that when you move to [facility]  Albuterol inhaler was refilled  Make sure you get a primary care doctor and a rheumatology doctor in [place] also need an eye doctor to follow any effects of Plaquenil on your eyes  Your labs recently with your rheumatology doctor were normal  Call us if you need assistance we can cover you for 30 to 45 days after your move

## 2022-06-27 NOTE — Assessment & Plan Note (Signed)
Continue with Plaquenil per rheumatology
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
## 2022-06-27 NOTE — Assessment & Plan Note (Signed)
Lifestyle management consulting given

## 2022-06-27 NOTE — Assessment & Plan Note (Signed)
Monitor

## 2022-06-29 LAB — CERVICOVAGINAL ANCILLARY ONLY
Bacterial Vaginitis (gardnerella): NEGATIVE
Candida Glabrata: NEGATIVE
Candida Vaginitis: NEGATIVE
Chlamydia: NEGATIVE
Comment: NEGATIVE
Comment: NEGATIVE
Comment: NEGATIVE
Comment: NEGATIVE
Comment: NEGATIVE
Comment: NORMAL
Neisseria Gonorrhea: NEGATIVE
Trichomonas: NEGATIVE

## 2022-06-29 LAB — RPR: RPR Ser Ql: NONREACTIVE

## 2022-06-29 LAB — HIV ANTIBODY (ROUTINE TESTING W REFLEX): HIV Screen 4th Generation wRfx: NONREACTIVE

## 2022-06-29 LAB — HSV-2 AB, IGG: HSV 2 IgG, Type Spec: 0.93 index — ABNORMAL HIGH (ref 0.00–0.90)

## 2022-06-29 LAB — HCV AB W REFLEX TO QUANT PCR: HCV Ab: NONREACTIVE

## 2022-06-29 LAB — HCV INTERPRETATION

## 2022-06-29 NOTE — Progress Notes (Signed)
Let pt know all her STD tests are negative/normal

## 2022-07-01 ENCOUNTER — Encounter: Payer: Self-pay | Admitting: Critical Care Medicine

## 2022-07-05 ENCOUNTER — Telehealth: Payer: Self-pay

## 2022-07-05 NOTE — Telephone Encounter (Signed)
-----   Message from Elsie Stain, MD sent at 06/29/2022  2:34 PM EST ----- Let pt know all her STD tests are negative/normal

## 2022-07-05 NOTE — Telephone Encounter (Signed)
Pt was called and vm was left, Information has been sent to nurse pool.   

## 2022-07-16 ENCOUNTER — Telehealth: Payer: Self-pay

## 2022-07-16 DIAGNOSIS — G319 Degenerative disease of nervous system, unspecified: Secondary | ICD-10-CM

## 2022-07-16 DIAGNOSIS — R269 Unspecified abnormalities of gait and mobility: Secondary | ICD-10-CM

## 2022-07-16 DIAGNOSIS — G119 Hereditary ataxia, unspecified: Secondary | ICD-10-CM

## 2022-07-16 NOTE — Telephone Encounter (Signed)
Order placed and is on printer I can sign tomorrow

## 2022-07-16 NOTE — Telephone Encounter (Signed)
I spoke to patient's mother, Silva Bandy, and she explained that they went to pick up the walker and it was not a rollator which what she requested.  I explained that she had a rollator ordered 02/2021 and her insurance may not pay for another but her mother said that she has new insurance.   Dr Joya Gaskins - she would like an order for a rollator sent to [facility] as soon as possible because they are going out of town.

## 2022-07-17 NOTE — Telephone Encounter (Signed)
Noted  

## 2022-07-17 NOTE — Telephone Encounter (Signed)
Order for rollator faxed to [facility]

## 2022-07-20 ENCOUNTER — Telehealth: Payer: Self-pay | Admitting: Emergency Medicine

## 2022-07-20 NOTE — Telephone Encounter (Signed)
Likely because she has already left to go to [place] to live

## 2022-07-20 NOTE — Telephone Encounter (Signed)
Copied from [place] [id number]. Topic: General - Other >> Jul 20, 2022  1:20 PM Santiya F wrote: Reason for CRM: Rashonda with [employer] is calling because Dr. Joya Gaskins sent in a prescription for a rollator and [employer] hasn't heard from the patient. Debarah Crape says they've called three times and left three voicemails and the patient hasn't returned any calls. Debarah Crape says when a patient is unreachable they must reach out to the provider to let them know. Debarah Crape says if there are any questions, you can call her at [phone]

## 2022-07-20 NOTE — Telephone Encounter (Signed)
FYI

## 2022-07-23 NOTE — Telephone Encounter (Signed)
noted 

## 2022-07-23 NOTE — Telephone Encounter (Signed)
I called patient and she was with her mother in [place].   Her mother said that she plans to pick up the rollator when she returns to [facility] and then ship it back to her daughter in [place].

## 2022-09-06 ENCOUNTER — Telehealth: Payer: Self-pay

## 2022-09-06 NOTE — Telephone Encounter (Signed)
I spoke to [facility] and she said that the patient did not receive the rollator.  They attempted to reach out to her 3 times and were not successful.
